# Patient Record
Sex: Female | Born: 1955 | Race: White | Hispanic: No | Marital: Married | Smoking: Never smoker
Health system: Southern US, Community
[De-identification: ages and names within clinical notes are randomized; demographics above are authoritative.]

## PROBLEM LIST (undated history)

## (undated) DIAGNOSIS — G43909 Migraine, unspecified, not intractable, without status migrainosus: Secondary | ICD-10-CM

## (undated) DIAGNOSIS — T7840XA Allergy, unspecified, initial encounter: Secondary | ICD-10-CM

## (undated) DIAGNOSIS — I871 Compression of vein: Principal | ICD-10-CM

## (undated) DIAGNOSIS — Z8489 Family history of other specified conditions: Secondary | ICD-10-CM

## (undated) DIAGNOSIS — K635 Polyp of colon: Secondary | ICD-10-CM

## (undated) DIAGNOSIS — T883XXA Malignant hyperthermia due to anesthesia, initial encounter: Secondary | ICD-10-CM

## (undated) DIAGNOSIS — I10 Essential (primary) hypertension: Secondary | ICD-10-CM

## (undated) DIAGNOSIS — T753XXA Motion sickness, initial encounter: Secondary | ICD-10-CM

## (undated) DIAGNOSIS — J3089 Other allergic rhinitis: Secondary | ICD-10-CM

## (undated) HISTORY — PX: COLON SURGERY: SHX602

## (undated) HISTORY — DX: Other allergic rhinitis: J30.89

## (undated) HISTORY — DX: Migraine, unspecified, not intractable, without status migrainosus: G43.909

## (undated) HISTORY — DX: Allergy, unspecified, initial encounter: T78.40XA

## (undated) HISTORY — DX: Essential (primary) hypertension: I10

## (undated) HISTORY — DX: Polyp of colon: K63.5

## (undated) HISTORY — DX: Motion sickness, initial encounter: T75.3XXA

## (undated) HISTORY — PX: WISDOM TOOTH EXTRACTION: SHX21

---

## 2013-10-06 DIAGNOSIS — D126 Benign neoplasm of colon, unspecified: Secondary | ICD-10-CM

## 2013-10-06 DIAGNOSIS — N321 Vesicointestinal fistula: Secondary | ICD-10-CM

## 2013-10-06 HISTORY — PX: LAPAROSCOPIC LEFT COLON RESECTION: SHX1928

## 2013-10-06 HISTORY — DX: Benign neoplasm of colon, unspecified: D12.6

## 2013-10-06 HISTORY — DX: Vesicointestinal fistula: N32.1

## 2013-10-06 HISTORY — PX: COLONOSCOPY: SHX174

## 2014-07-06 LAB — HM COLONOSCOPY

## 2015-10-07 LAB — HM PAP SMEAR: HM Pap smear: NEGATIVE

## 2015-10-07 LAB — HM MAMMOGRAPHY: HM Mammogram: NORMAL (ref 0–4)

## 2016-10-03 ENCOUNTER — Ambulatory Visit: Payer: Self-pay

## 2016-10-07 ENCOUNTER — Ambulatory Visit (INDEPENDENT_AMBULATORY_CARE_PROVIDER_SITE_OTHER): Payer: 59 | Admitting: Urgent Care

## 2016-10-07 VITALS — BP 116/68 | HR 70 | Temp 98.2°F | Resp 18 | Ht 68.0 in | Wt 170.2 lb

## 2016-10-07 DIAGNOSIS — J069 Acute upper respiratory infection, unspecified: Secondary | ICD-10-CM | POA: Diagnosis not present

## 2016-10-07 DIAGNOSIS — J029 Acute pharyngitis, unspecified: Secondary | ICD-10-CM | POA: Diagnosis not present

## 2016-10-07 DIAGNOSIS — B9789 Other viral agents as the cause of diseases classified elsewhere: Secondary | ICD-10-CM | POA: Diagnosis not present

## 2016-10-07 DIAGNOSIS — R0982 Postnasal drip: Secondary | ICD-10-CM | POA: Diagnosis not present

## 2016-10-07 MED ORDER — BENZONATATE 100 MG PO CAPS: 100.0000 mg | ORAL_CAPSULE | Freq: Three times a day (TID) | ORAL | 0 refills | Status: DC | PRN

## 2016-10-07 MED ORDER — AMOXICILLIN 500 MG PO CAPS: 500.0000 mg | ORAL_CAPSULE | Freq: Two times a day (BID) | ORAL | 0 refills | Status: DC

## 2016-10-07 MED ORDER — HYDROCODONE-HOMATROPINE 5-1.5 MG/5ML PO SYRP: 5.0000 mL | ORAL_SOLUTION | Freq: Every evening | ORAL | 0 refills | Status: DC | PRN

## 2016-10-07 MED ORDER — PSEUDOEPHEDRINE HCL ER 120 MG PO TB12: 120.0000 mg | ORAL_TABLET | Freq: Two times a day (BID) | ORAL | 3 refills | Status: DC

## 2016-10-07 NOTE — Progress Notes (Signed)
    MRN: [id number] DOB: 07/03/1956  Subjective:   Heather Hudson is a 61 y.o. female presenting for chief complaint of Nasal Congestion (x 1 week ); Cough; and Sore Throat  Reports 1 week history of worsening productive cough, sore throat, post-nasal drainage, subjective fever, fatigue. Also has sinus pain, bilateral intermittent ear pain. Has tried APAP, DayQuil with some relief. Denies red eyes, dizziness, tinnitus, ear drainage, tooth pain, chest pain, shob, wheezing, n/v, abdominal pain, rashes. Has had multiple sick contacts at work, works at a IT trainer at [employer]. Denies smoking cigarettes. Has 4 drinks of alcohol per week.   Heather Hudson has a current medication list which includes the following prescription(s): acetaminophen and pseudoephedrine-apap-dm. Also is allergic to aspirin and ibuprofen.  Heather Hudson has seasonal allergies. She used to get injections weekly for this. Denies past surgical history.   Objective:   Vitals: BP 116/68   Pulse 70   Temp 98.2 F (36.8 C) (Oral)   Resp 18   Ht 5\' 8"  (1.727 m)   Wt 170 lb 3.2 oz (77.2 kg)   SpO2 99%   BMI 25.88 kg/m   Physical Exam  Constitutional: She is oriented to person, place, and time. She appears well-developed and well-nourished.  Eyes: Right eye exhibits no discharge. Left eye exhibits no discharge.  Neck: Normal range of motion. Neck supple.  Cardiovascular: Normal rate, regular rhythm and intact distal pulses.  Exam reveals no gallop and no friction rub.   No murmur heard. Pulmonary/Chest: No respiratory distress. She has no wheezes. She has no rales.  Lymphadenopathy:    She has no cervical adenopathy.  Neurological: She is alert and oriented to person, place, and time.  Skin: Skin is warm and dry.   Assessment and Plan :   1. Viral URI with cough 2. Sore throat 3. Post-nasal drainage - Likely viral in nature, advised supportive care. I did provide patient with a script for amoxicillin if  she has no improvement by 10/11/2016 to cover for bacterial sinusitis. - If no improvement thereafter, return to clinic for a recheck.   Jaynee Eagles, PA-C [facility] [phone] 10/07/2016 5:38 PM

## 2016-10-07 NOTE — Patient Instructions (Addendum)
Upper Respiratory Infection, Adult Most upper respiratory infections (URIs) are a viral infection of the air passages leading to the lungs. A URI affects the nose, throat, and upper air passages. The most common type of URI is nasopharyngitis and is typically referred to as "the common cold." URIs run their course and usually go away on their own. Most of the time, a URI does not require medical attention, but sometimes a bacterial infection in the upper airways can follow a viral infection. This is called a secondary infection. Sinus and middle ear infections are common types of secondary upper respiratory infections. Bacterial pneumonia can also complicate a URI. A URI can worsen asthma and chronic obstructive pulmonary disease (COPD). Sometimes, these complications can require emergency medical care and may be life threatening. What are the causes? Almost all URIs are caused by viruses. A virus is a type of germ and can spread from one person to another. What increases the risk? You may be at risk for a URI if:  You smoke.  You have chronic heart or lung disease.  You have a weakened defense (immune) system.  You are very young or very old.  You have nasal allergies or asthma.  You work in crowded or poorly ventilated areas.  You work in health care facilities or schools.  What are the signs or symptoms? Symptoms typically develop 2-3 days after you come in contact with a cold virus. Most viral URIs last 7-10 days. However, viral URIs from the influenza virus (flu virus) can last 14-18 days and are typically more severe. Symptoms may include:  Runny or stuffy (congested) nose.  Sneezing.  Cough.  Sore throat.  Headache.  Fatigue.  Fever.  Loss of appetite.  Pain in your forehead, behind your eyes, and over your cheekbones (sinus pain).  Muscle aches.  How is this diagnosed? Your health care provider may diagnose a URI by:  Physical exam.  Tests to check that your  symptoms are not due to another condition such as: ? Strep throat. ? Sinusitis. ? Pneumonia. ? Asthma.  How is this treated? A URI goes away on its own with time. It cannot be cured with medicines, but medicines may be prescribed or recommended to relieve symptoms. Medicines may help:  Reduce your fever.  Reduce your cough.  Relieve nasal congestion.  Follow these instructions at home:  Take medicines only as directed by your health care provider.  Gargle warm saltwater or take cough drops to comfort your throat as directed by your health care provider.  Use a warm mist humidifier or inhale steam from a shower to increase air moisture. This may make it easier to breathe.  Drink enough fluid to keep your urine clear or pale yellow.  Eat soups and other clear broths and maintain good nutrition.  Rest as needed.  Return to work when your temperature has returned to normal or as your health care provider advises. You may need to stay home longer to avoid infecting others. You can also use a face mask and careful hand washing to prevent spread of the virus.  Increase the usage of your inhaler if you have asthma.  Do not use any tobacco products, including cigarettes, chewing tobacco, or electronic cigarettes. If you need help quitting, ask your health care provider. How is this prevented? The best way to protect yourself from getting a cold is to practice good hygiene.  Avoid oral or hand contact with people with cold symptoms.  Wash your   hands often if contact occurs.  There is no clear evidence that vitamin C, vitamin E, echinacea, or exercise reduces the chance of developing a cold. However, it is always recommended to get plenty of rest, exercise, and practice good nutrition. Contact a health care provider if:  You are getting worse rather than better.  Your symptoms are not controlled by medicine.  You have chills.  You have worsening shortness of breath.  You have  brown or red mucus.  You have yellow or brown nasal discharge.  You have pain in your face, especially when you bend forward.  You have a fever.  You have swollen neck glands.  You have pain while swallowing.  You have white areas in the back of your throat. Get help right away if:  You have severe or persistent: ? Headache. ? Ear pain. ? Sinus pain. ? Chest pain.  You have chronic lung disease and any of the following: ? Wheezing. ? Prolonged cough. ? Coughing up blood. ? A change in your usual mucus.  You have a stiff neck.  You have changes in your: ? Vision. ? Hearing. ? Thinking. ? Mood. This information is not intended to replace advice given to you by your health care provider. Make sure you discuss any questions you have with your health care provider. Document Released: 03/18/2001 Document Revised: 05/25/2016 Document Reviewed: 12/28/2013 Elsevier Interactive Patient Education  2017 Elsevier Inc.     IF you received an x-ray today, you will receive an invoice from [facility]. Please contact [facility] at [phone] with questions or concerns regarding your invoice.   IF you received labwork today, you will receive an invoice from [facility]. Please contact [facility] at [phone] with questions or concerns regarding your invoice.   Our billing staff will not be able to assist you with questions regarding bills from these companies.  You will be contacted with the lab results as soon as they are available. The fastest way to get your results is to activate your My Chart account. Instructions are located on the last page of this paperwork. If you have not heard from us regarding the results in 2 weeks, please contact this office.     

## 2018-01-04 ENCOUNTER — Ambulatory Visit: Payer: Self-pay | Admitting: Primary Care

## 2018-01-04 DIAGNOSIS — Z0289 Encounter for other administrative examinations: Secondary | ICD-10-CM

## 2018-01-26 ENCOUNTER — Ambulatory Visit: Payer: 59 | Admitting: Primary Care

## 2018-01-26 ENCOUNTER — Encounter: Payer: Self-pay | Admitting: Primary Care

## 2018-01-26 VITALS — BP 120/72 | HR 59 | Temp 98.2°F | Ht 67.0 in | Wt 169.5 lb

## 2018-01-26 DIAGNOSIS — T753XXS Motion sickness, sequela: Secondary | ICD-10-CM

## 2018-01-26 DIAGNOSIS — J3089 Other allergic rhinitis: Secondary | ICD-10-CM | POA: Diagnosis not present

## 2018-01-26 DIAGNOSIS — T753XXA Motion sickness, initial encounter: Secondary | ICD-10-CM | POA: Insufficient documentation

## 2018-01-26 MED ORDER — SCOPOLAMINE 1 MG/3DAYS TD PT72: 1.0000 | MEDICATED_PATCH | TRANSDERMAL | 0 refills | Status: DC

## 2018-01-26 NOTE — Assessment & Plan Note (Signed)
Rx for scopolamine patches sent to pharmacy. Discussed instructions for use and drowsiness precautions.

## 2018-01-26 NOTE — Assessment & Plan Note (Signed)
Chronic for years. Previously following with an allergist in [place] and would like to establish locally. Referral placed. Also recommended to start antihistamine daily and continue during the Spring season.

## 2018-01-26 NOTE — Progress Notes (Signed)
Subjective:    Patient ID: Heather Hudson, female    DOB: 03-08-1956, 62 y.o.   MRN: [id number]  HPI  Ms. Karpowicz is a 62 year old female who presents today to establish care and discuss the problems mentioned below. Will obtain old records.  1) Environmental Allergies: Diagnosed in childhood, improved for years, then problematic around 2008. Previously following with an allergist in [place], receiving injections from in April 2008 to November 2012. She was found to be allergic to spores, pollen, cat dander, tree grass. Moved from [place] to [place] in November 2017 and has had experienced an increase in allergy symptoms that are different from [place].   Since moving to [place] she's experiencing symptoms of headaches, nasal congestion, sinus pressure, post nasal drip during Spring and Fall months. She doesn't typically experience symptoms during Summer and Winter months. She does have a cat and carpet in her home.   She is not taking anything for her symptoms regularly, occasionally takes Claritin and will mostly deal with her symptoms. She's not sure if Claritin helps.   2) Motion Sickness: Present for the past 20 years, first noticed symptoms when on a sail boat at the age of 31. She's tried treatment with motion sickness bracelets and dramamine without much improvement. She will be going on a cruise that will leave May 11th and will be gone for 4 nights. She's never tried scopolamine patches or Meclizine. She has a family history of "inner ear" disorders with her mother.    Review of Systems  HENT: Positive for congestion, postnasal drip and sinus pressure.   Respiratory: Negative for cough, shortness of breath and wheezing.   Cardiovascular: Negative for chest pain.  Allergic/Immunologic: Positive for environmental allergies.  Neurological: Positive for headaches. Negative for dizziness.       No past medical history on file.   Social History   Socioeconomic History    . Marital status: Married    Spouse name: Not on file  . Number of children: Not on file  . Years of education: Not on file  . Highest education level: Not on file  Occupational History  . Not on file  Social Needs  . Financial resource strain: Not on file  . Food insecurity:    Worry: Not on file    Inability: Not on file  . Transportation needs:    Medical: Not on file    Non-medical: Not on file  Tobacco Use  . Smoking status: Never Smoker  . Smokeless tobacco: Never Used  Substance and Sexual Activity  . Alcohol use: Yes    Alcohol/week: 0.6 oz    Types: 1 Glasses of wine per week  . Drug use: No  . Sexual activity: Not on file  Lifestyle  . Physical activity:    Days per week: Not on file    Minutes per session: Not on file  . Stress: Not on file  Relationships  . Social connections:    Talks on phone: Not on file    Gets together: Not on file    Attends religious service: Not on file    Active member of club or organization: Not on file    Attends meetings of clubs or organizations: Not on file    Relationship status: Not on file  . Intimate partner violence:    Fear of current or ex partner: Not on file    Emotionally abused: Not on file    Physically abused: Not on  file    Forced sexual activity: Not on file  Other Topics Concern  . Not on file  Social History Narrative   Married.   Moved from [place].   No children.   Works in [employer].    Enjoys wine tasting.     No family history on file.  Allergies  Allergen Reactions  . Anesthesia S-I-60 Other (See Comments)  . Aspirin Hives  . Ibuprofen Hives  . Other     Anesthesia (familial) SISTER HAD MALIGNANT HYPERTHERMIA    No current outpatient medications on file prior to visit.   No current facility-administered medications on file prior to visit.     BP 120/72   Pulse (!) 59   Temp 98.2 F (36.8 C) (Oral)   Ht 5\' 7"  (1.702 m)   Wt 169 lb 8 oz (76.9 kg)   SpO2 98%    BMI 26.55 kg/m    Objective:   Physical Exam  Constitutional: She appears well-nourished.  Neck: Neck supple.  Cardiovascular: Normal rate and regular rhythm.  Pulmonary/Chest: Effort normal and breath sounds normal.  Skin: Skin is warm and dry.  Psychiatric: She has a normal mood and affect.          Assessment & Plan:

## 2018-01-26 NOTE — Patient Instructions (Addendum)
Apply the scopolamine patch behind the ear, on a hairless area, 4 hours prior to your cruise. Change every 3 days.   You will be contacted regarding your referral to the allergist.  Please let us know if you have not been contacted within one week.   Start Claritin or Zyrtec daily for at least 4-6 weeks during allergy season.  Please schedule a physical with me anytime this year.   It was a pleasure to meet you today! Please don't hesitate to call or message me with any questions. Welcome to [facility]!

## 2018-01-27 ENCOUNTER — Telehealth: Payer: Self-pay | Admitting: Primary Care

## 2018-01-27 ENCOUNTER — Encounter: Payer: Self-pay | Admitting: Primary Care

## 2018-01-27 NOTE — Telephone Encounter (Signed)
Copied from [place] [id number]. Topic: Quick Communication - See Telephone Encounter >> Jan 27, 2018  5:13 PM Rutherford Nail, NT wrote: CRM for notification. See Telephone encounter for: 01/27/18. Marlowe Kays from [facility] is calling to see if a fax was received  for patient records. States a request of information was sent from the office to them and requesting information from 2010-present. States that the patient was seen in 2013. The fax is 48 pages. States she was wondering if there was certain information that Allie Bossier was looking for that she could prioritize the information if needed. States the office will be open until 6pm today(01/27/18) and tomorrow(01/28/18) from 1pm-7pm. Please advise.  CB#: [phone].

## 2018-01-28 NOTE — Telephone Encounter (Signed)
Can you notify them that we don't need any further records unless it's from her PCP?

## 2018-01-28 NOTE — Telephone Encounter (Signed)
No need for further records from [facility].  Anastaisya, did we get those records from [facility] and Allergy faxed to the local allergist.

## 2018-01-28 NOTE — Telephone Encounter (Signed)
Yes I did fax over her records from [place] office that patient brought with her to her visit to local allergist and appt set up for her too.Kris Mouton, RMA

## 2018-01-28 NOTE — Telephone Encounter (Signed)
Spoke with Heather Hudson at [facility] and advised her we do not need Allergy records and that patient brought them to [employer], RMA

## 2018-07-14 ENCOUNTER — Ambulatory Visit: Payer: 59 | Admitting: Primary Care

## 2018-07-21 ENCOUNTER — Other Ambulatory Visit: Payer: Self-pay | Admitting: Primary Care

## 2018-07-21 DIAGNOSIS — Z1231 Encounter for screening mammogram for malignant neoplasm of breast: Secondary | ICD-10-CM

## 2018-08-27 ENCOUNTER — Ambulatory Visit
Admission: RE | Admit: 2018-08-27 | Discharge: 2018-08-27 | Disposition: A | Discharge status: Home / Self Care | Payer: 59 | Source: Ambulatory Visit | Attending: Primary Care | Admitting: Primary Care

## 2018-08-27 DIAGNOSIS — Z1231 Encounter for screening mammogram for malignant neoplasm of breast: Secondary | ICD-10-CM

## 2018-08-27 IMAGING — MG DIGITAL SCREENING BILATERAL MAMMOGRAM WITH CAD
1 series · 1 of 1 positions shown · non-contrast
Comparison: None.

CLINICAL DATA: Screening.

EXAM:
DIGITAL SCREENING BILATERAL MAMMOGRAM WITH CAD

[R MLO]
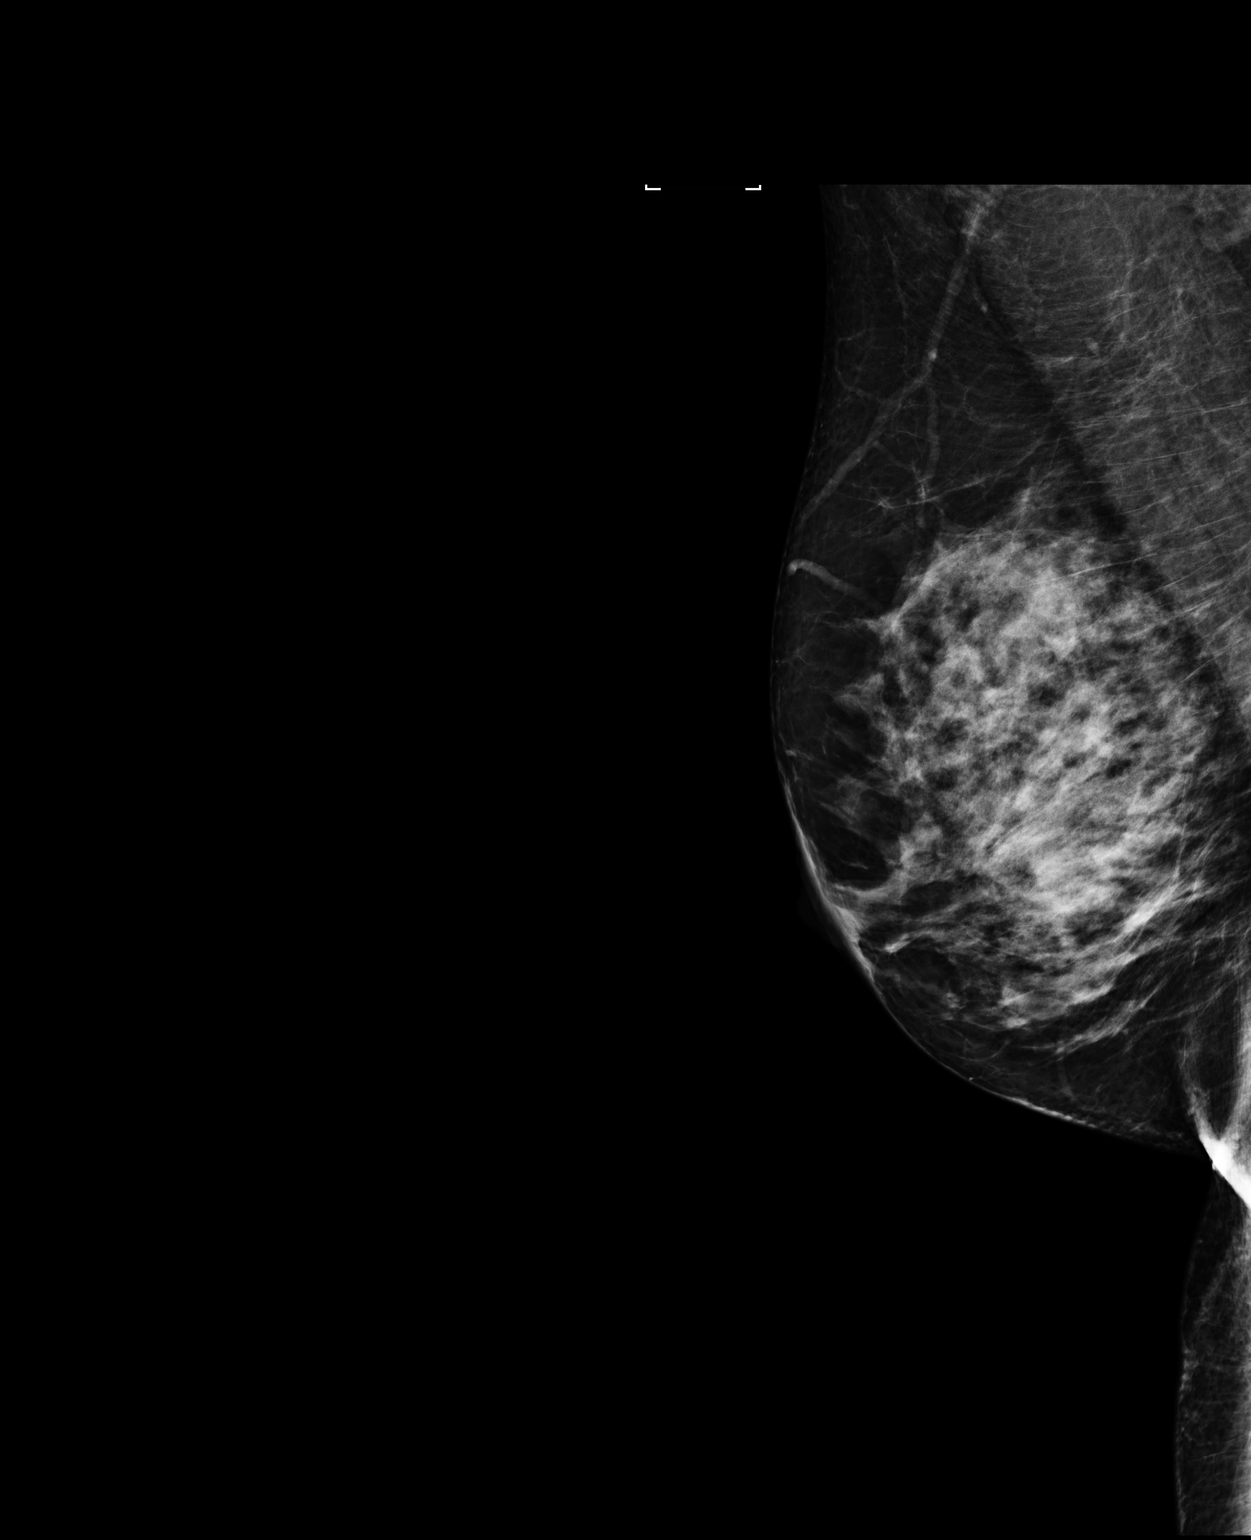

[1 of 1 positions shown; findings below may reference images not displayed]

ACR Breast Density Category c: The breast tissue is heterogeneously
dense, which may obscure small masses
FINDINGS: There are no findings suspicious for malignancy. Images were
processed with CAD.
IMPRESSION: No mammographic evidence of malignancy. A result letter of this
screening mammogram will be mailed directly to the patient.

RECOMMENDATION:
Screening mammogram in one year. (Code:[RG])

BI-RADS CATEGORY  1: Negative.

## 2018-09-05 ENCOUNTER — Other Ambulatory Visit: Payer: Self-pay | Admitting: Primary Care

## 2018-09-05 DIAGNOSIS — Z1159 Encounter for screening for other viral diseases: Secondary | ICD-10-CM

## 2018-09-05 DIAGNOSIS — Z0000 Encounter for general adult medical examination without abnormal findings: Secondary | ICD-10-CM

## 2018-09-06 ENCOUNTER — Other Ambulatory Visit (INDEPENDENT_AMBULATORY_CARE_PROVIDER_SITE_OTHER): Payer: 59

## 2018-09-06 DIAGNOSIS — Z1159 Encounter for screening for other viral diseases: Secondary | ICD-10-CM

## 2018-09-06 DIAGNOSIS — Z0000 Encounter for general adult medical examination without abnormal findings: Secondary | ICD-10-CM | POA: Diagnosis not present

## 2018-09-07 ENCOUNTER — Encounter: Payer: Self-pay | Admitting: Primary Care

## 2018-09-07 ENCOUNTER — Other Ambulatory Visit (HOSPITAL_COMMUNITY)
Admission: RE | Admit: 2018-09-07 | Discharge: 2018-09-07 | Disposition: A | Discharge status: Home / Self Care | Payer: 59 | Source: Ambulatory Visit | Attending: Primary Care | Admitting: Primary Care

## 2018-09-07 ENCOUNTER — Ambulatory Visit (INDEPENDENT_AMBULATORY_CARE_PROVIDER_SITE_OTHER): Payer: 59 | Admitting: Primary Care

## 2018-09-07 ENCOUNTER — Ambulatory Visit: Payer: 59 | Admitting: Primary Care

## 2018-09-07 VITALS — BP 122/78 | HR 61 | Temp 98.5°F | Ht 67.0 in | Wt 174.5 lb

## 2018-09-07 DIAGNOSIS — Z124 Encounter for screening for malignant neoplasm of cervix: Secondary | ICD-10-CM | POA: Insufficient documentation

## 2018-09-07 DIAGNOSIS — Z23 Encounter for immunization: Secondary | ICD-10-CM | POA: Diagnosis not present

## 2018-09-07 DIAGNOSIS — E785 Hyperlipidemia, unspecified: Secondary | ICD-10-CM | POA: Diagnosis not present

## 2018-09-07 DIAGNOSIS — Z0000 Encounter for general adult medical examination without abnormal findings: Secondary | ICD-10-CM | POA: Diagnosis not present

## 2018-09-07 LAB — LIPID PANEL
Cholesterol: 225 mg/dL — ABNORMAL HIGH (ref 0–200)
HDL: 55.8 mg/dL (ref >39.00)
LDL Cholesterol: 147 mg/dL — ABNORMAL HIGH (ref 0–99)
NonHDL: 169.2
Total CHOL/HDL Ratio: 4
Triglycerides: 109 mg/dL (ref 0.0–149.0)
VLDL: 21.8 mg/dL (ref 0.0–40.0)

## 2018-09-07 LAB — COMPREHENSIVE METABOLIC PANEL
ALT: 24 U/L (ref 0–35)
AST: 21 U/L (ref 0–37)
Albumin: 4.7 g/dL (ref 3.5–5.2)
Alkaline Phosphatase: 74 U/L (ref 39–117)
BUN: 12 mg/dL (ref 6–23)
CO2: 26 mEq/L (ref 19–32)
Calcium: 10.1 mg/dL (ref 8.4–10.5)
Chloride: 105 mEq/L (ref 96–112)
Creatinine, Ser: 0.67 mg/dL (ref 0.40–1.20)
GFR: 95.03 mL/min (ref >60.00)
Glucose, Bld: 87 mg/dL (ref 70–99)
Potassium: 3.9 mEq/L (ref 3.5–5.1)
SODIUM: 139 meq/L (ref 135–145)
TOTAL PROTEIN: 7.3 g/dL (ref 6.0–8.3)
Total Bilirubin: 0.5 mg/dL (ref 0.2–1.2)

## 2018-09-07 LAB — HEPATITIS C ANTIBODY
Hepatitis C Ab: NONREACTIVE
SIGNAL TO CUT-OFF: 0.01 (ref <1.00)

## 2018-09-07 MED ORDER — ZOSTER VAC RECOMB ADJUVANTED 50 MCG/0.5ML IM SUSR: 0.5000 mL | Freq: Once | INTRAMUSCULAR | 1 refills | Status: AC

## 2018-09-07 NOTE — Progress Notes (Signed)
Subjective:    Patient ID: Heather Hudson, female    DOB: [birth year], 62 y.o.   MRN: [id number]  HPI  Ms. Heather Hudson is a 62 year old female who presents today for complete physical.  Immunizations: -Tetanus: Completed in 2015 -Influenza: Will get at [employer] -Shingles: Never completed  Diet: She endorses a fair diet. Breakfast: Hard boiled eggs, oatmeal Lunch: Chicken, pork chop, pasta, salad,  Dinner: Lighter dinner, sometimes snacks Snacks: Popcorn, candy Desserts: Once weekly  Beverages: Coffee, little water, wine    Exercise: She is not exercising  Eye exam: Completed in December 2018 Dental exam: Completes semi-annually  Colonoscopy: Completed in 2015 Pap Smear: Completed in either 2016 or 2017.  Records were not received. Mammogram: Completed in 2019 Hep C Screen: Negative   Review of Systems  Constitutional: Negative for unexpected weight change.  HENT: Negative for rhinorrhea.   Respiratory: Negative for cough and shortness of breath.   Cardiovascular: Negative for chest pain.  Gastrointestinal: Negative for constipation and diarrhea.  Genitourinary: Negative for difficulty urinating.  Musculoskeletal: Negative for arthralgias.  Skin: Negative for rash.  Allergic/Immunologic: Positive for environmental allergies.  Neurological: Negative for dizziness and numbness.       Occasional headaches with weather changes  Psychiatric/Behavioral: The patient is not nervous/anxious.        Past Medical History:  Diagnosis Date  . Environmental and seasonal allergies   . Migraines   . Motion sickness      Social History   Socioeconomic History  . Marital status: Married    Spouse name: Not on file  . Number of children: Not on file  . Years of education: Not on file  . Highest education level: Not on file  Occupational History  . Not on file  Social Needs  . Financial resource strain: Not on file  . Food insecurity:    Worry: Not on file    Inability: Not on  file  . Transportation needs:    Medical: Not on file    Non-medical: Not on file  Tobacco Use  . Smoking status: Never Smoker  . Smokeless tobacco: Never Used  Substance and Sexual Activity  . Alcohol use: Yes    Alcohol/week: 1.0 standard drinks    Types: 1 Glasses of wine per week  . Drug use: No  . Sexual activity: Not on file  Lifestyle  . Physical activity:    Days per week: Not on file    Minutes per session: Not on file  . Stress: Not on file  Relationships  . Social connections:    Talks on phone: Not on file    Gets together: Not on file    Attends religious service: Not on file    Active member of club or organization: Not on file    Attends meetings of clubs or organizations: Not on file    Relationship status: Not on file  . Intimate partner violence:    Fear of current or ex partner: Not on file    Emotionally abused: Not on file    Physically abused: Not on file    Forced sexual activity: Not on file  Other Topics Concern  . Not on file  Social History Narrative   Married.   Moved from [place].   No children.   Works in [employer].    Enjoys wine tasting.      No family history on file.  Allergies  Allergen Reactions  . Anesthesia  S-I-60 Other (See Comments)  . Aspirin Hives  . Ibuprofen Hives  . Other     Anesthesia (familial) SISTER HAD MALIGNANT HYPERTHERMIA    No current outpatient medications on file prior to visit.   No current facility-administered medications on file prior to visit.     BP 122/78   Pulse 61   Temp 98.5 F (36.9 C) (Oral)   Ht 5\' 7"  (1.702 m)   Wt 174 lb 8 oz (79.2 kg)   SpO2 98%   BMI 27.33 kg/m    Objective:   Physical Exam  Constitutional: She is oriented to person, place, and time. She appears well-nourished.  HENT:  Mouth/Throat: No oropharyngeal exudate.  Eyes: Pupils are equal, round, and reactive to light. EOM are normal.  Neck: Neck supple. No thyromegaly present.    Cardiovascular: Normal rate and regular rhythm.  Respiratory: Effort normal and breath sounds normal.  GI: Soft. Bowel sounds are normal. There is no tenderness.  Genitourinary: There is no tenderness or lesion on the right labia. There is no tenderness or lesion on the left labia. Cervix exhibits no discharge. Right adnexum displays no tenderness. Left adnexum displays no tenderness. No erythema in the vagina. No vaginal discharge found.  Musculoskeletal: Normal range of motion.  Neurological: She is alert and oriented to person, place, and time.  Skin: Skin is warm and dry.  Psychiatric: She has a normal mood and affect.           Assessment & Plan:

## 2018-09-07 NOTE — Patient Instructions (Signed)
Start exercising. You should be getting 150 minutes of moderate intensity exercise weekly.  It's important to improve your diet by reducing consumption of fast food, fried food, processed snack foods, sugary drinks. Increase consumption of fresh vegetables and fruits, whole grains, water.  Ensure you are drinking 64 ounces of water daily.  Take the shingles vaccination to your pharmacy.  It was a pleasure to see you today!

## 2018-09-07 NOTE — Assessment & Plan Note (Addendum)
Tetanus vaccination up-to-date.  Prescription provided for shingles vaccination.  [facility]. Mammogram up-to-date. Pap smear completed, pending. Recommended regular exercise, continue to work on her diet. Exam unremarkable. Labs reviewed. Follow-up in 1 year for CPE.

## 2018-09-07 NOTE — Assessment & Plan Note (Signed)
LDL slightly above goal at 147.  HDL 55.  Recommended she start exercising regularly, work on diet.  Repeat labs in 1 year.

## 2018-09-09 LAB — CYTOLOGY - PAP
DIAGNOSIS: NEGATIVE
HPV (WINDOPATH): NOT DETECTED

## 2018-11-19 ENCOUNTER — Ambulatory Visit: Payer: 59 | Admitting: Primary Care

## 2018-11-22 ENCOUNTER — Ambulatory Visit: Payer: 59 | Admitting: Primary Care

## 2018-11-22 ENCOUNTER — Encounter: Payer: Self-pay | Admitting: Primary Care

## 2018-11-22 VITALS — BP 124/80 | HR 57 | Temp 98.2°F | Ht 67.0 in | Wt 175.5 lb

## 2018-11-22 DIAGNOSIS — J019 Acute sinusitis, unspecified: Secondary | ICD-10-CM | POA: Diagnosis not present

## 2018-11-22 MED ORDER — AMOXICILLIN-POT CLAVULANATE 875-125 MG PO TABS: 1.0000 | ORAL_TABLET | Freq: Two times a day (BID) | ORAL | 0 refills | Status: DC

## 2018-11-22 MED ORDER — BENZONATATE 200 MG PO CAPS: 200.0000 mg | ORAL_CAPSULE | Freq: Three times a day (TID) | ORAL | 0 refills | Status: DC | PRN

## 2018-11-22 NOTE — Patient Instructions (Signed)
Start Augmentin antibiotics for the infection Take 1 tablet by mouth twice daily for 10 days.  You may take Benzonatate capsules for cough. Take 1 capsule by mouth three times daily as needed for cough.  Make sure to drink plenty of fluids and rest.  It was a pleasure to see you today!

## 2018-11-22 NOTE — Progress Notes (Signed)
Subjective:    Patient ID: Heather Hudson, female    DOB: 09-29-1956, 63 y.o.   MRN: [id number]  HPI  Heather Hudson is a 63 year old female with a history of environmental allergies who presents today with a chief complaint of sinus pressure.  She also reports nasal congestion, headache, dry throat. Her symptoms began two weeks ago with sore throat, then voice hoariness. Her cough is productive with green sputum. She's taken some Tylenol with some improvement in headaches. Over the last several days her symptoms have progressed.   Review of Systems  Constitutional: Positive for fatigue.  HENT: Positive for congestion, sinus pressure and sinus pain. Negative for ear pain and sore throat.   Respiratory: Positive for cough. Negative for shortness of breath.   Cardiovascular: Negative for chest pain.       Past Medical History:  Diagnosis Date  . Environmental and seasonal allergies   . Migraines   . Motion sickness      Social History   Socioeconomic History  . Marital status: Married    Spouse name: Not on file  . Number of children: Not on file  . Years of education: Not on file  . Highest education level: Not on file  Occupational History  . Not on file  Social Needs  . Financial resource strain: Not on file  . Food insecurity:    Worry: Not on file    Inability: Not on file  . Transportation needs:    Medical: Not on file    Non-medical: Not on file  Tobacco Use  . Smoking status: Never Smoker  . Smokeless tobacco: Never Used  Substance and Sexual Activity  . Alcohol use: Yes    Alcohol/week: 1.0 standard drinks    Types: 1 Glasses of wine per week  . Drug use: No  . Sexual activity: Not on file  Lifestyle  . Physical activity:    Days per week: Not on file    Minutes per session: Not on file  . Stress: Not on file  Relationships  . Social connections:    Talks on phone: Not on file    Gets together: Not on file    Attends religious service: Not on file   Active member of club or organization: Not on file    Attends meetings of clubs or organizations: Not on file    Relationship status: Not on file  . Intimate partner violence:    Fear of current or ex partner: Not on file    Emotionally abused: Not on file    Physically abused: Not on file    Forced sexual activity: Not on file  Other Topics Concern  . Not on file  Social History Narrative   Married.   Moved from [place].   No children.   Works in [employer].    Enjoys wine tasting.     No past surgical history on file.  No family history on file.  Allergies  Allergen Reactions  . Anesthesia S-[id number] Other (See Comments)  . Aspirin Hives  . Ibuprofen Hives  . Other     Anesthesia (familial) SISTER HAD MALIGNANT HYPERTHERMIA    No current outpatient medications on file prior to visit.   No current facility-administered medications on file prior to visit.     BP 124/80   Pulse (!) 57   Temp 98.2 F (36.8 C) (Oral)   Ht 5\' 7"  (1.702 m)   Wt 175 lb  8 oz (79.6 kg)   SpO2 98%   BMI 27.49 kg/m    Objective:   Physical Exam  Constitutional: She appears well-nourished. She does not appear ill.  HENT:  Right Ear: Tympanic membrane and ear canal normal.  Left Ear: Tympanic membrane and ear canal normal.  Nose: Mucosal edema present. Right sinus exhibits maxillary sinus tenderness. Right sinus exhibits no frontal sinus tenderness. Left sinus exhibits maxillary sinus tenderness. Left sinus exhibits no frontal sinus tenderness.  Mouth/Throat: Oropharynx is clear and moist.  Neck: Neck supple.  Cardiovascular: Normal rate and regular rhythm.  Respiratory: Effort normal and breath sounds normal. She has no wheezes.  Skin: Skin is warm and dry.           Assessment & Plan:  Acute Sinusitis:  Symptoms for nearly two weeks, now progressing. Exam today overall stable, clear lungs, sinus tenderness noted.  Do suspect bacterial involvement at this point  given exam and duration of symptoms. Rx for Augmentin course and Benzonatate capsules sent to pharmacy. Fluids, rest, follow up PRN.  [name], NP

## 2019-11-09 ENCOUNTER — Other Ambulatory Visit: Payer: Self-pay | Admitting: Primary Care

## 2019-11-09 DIAGNOSIS — Z1231 Encounter for screening mammogram for malignant neoplasm of breast: Secondary | ICD-10-CM

## 2019-11-10 ENCOUNTER — Ambulatory Visit
Admission: RE | Admit: 2019-11-10 | Discharge: 2019-11-10 | Disposition: A | Discharge status: Home / Self Care | Payer: 59 | Source: Ambulatory Visit

## 2019-11-10 ENCOUNTER — Other Ambulatory Visit: Payer: Self-pay

## 2019-11-10 DIAGNOSIS — Z1231 Encounter for screening mammogram for malignant neoplasm of breast: Secondary | ICD-10-CM

## 2019-11-10 IMAGING — MG DIGITAL SCREENING BILAT W/ TOMO W/ CAD
8 series · 8 of 24 positions shown · non-contrast
Comparison: Previous exam(s).

CLINICAL DATA: Screening.

EXAM:
DIGITAL SCREENING BILATERAL MAMMOGRAM WITH TOMO AND CAD

[R CC synth-2D]
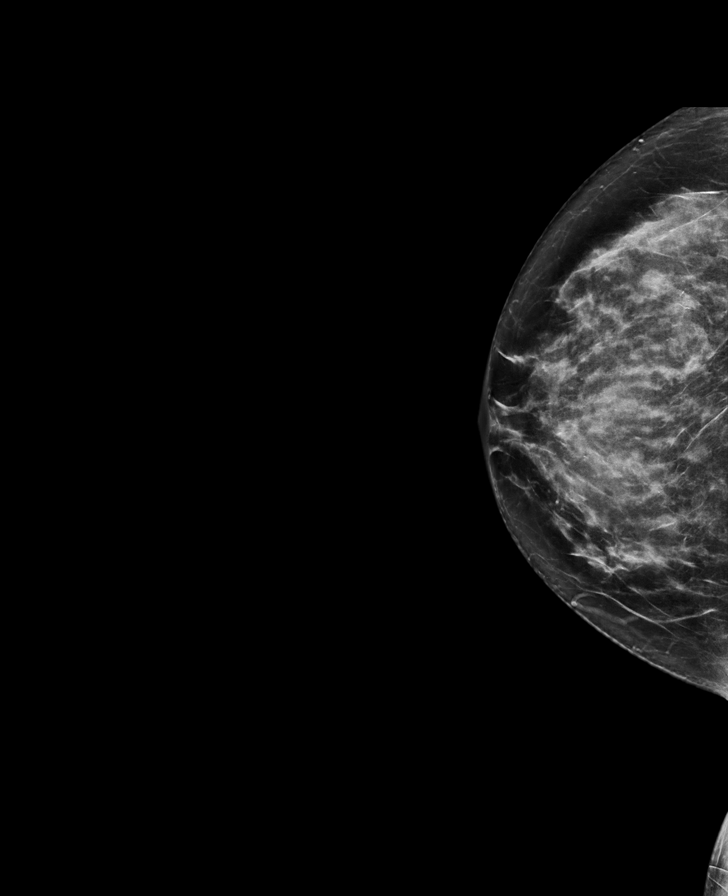

[L CC synth-2D]
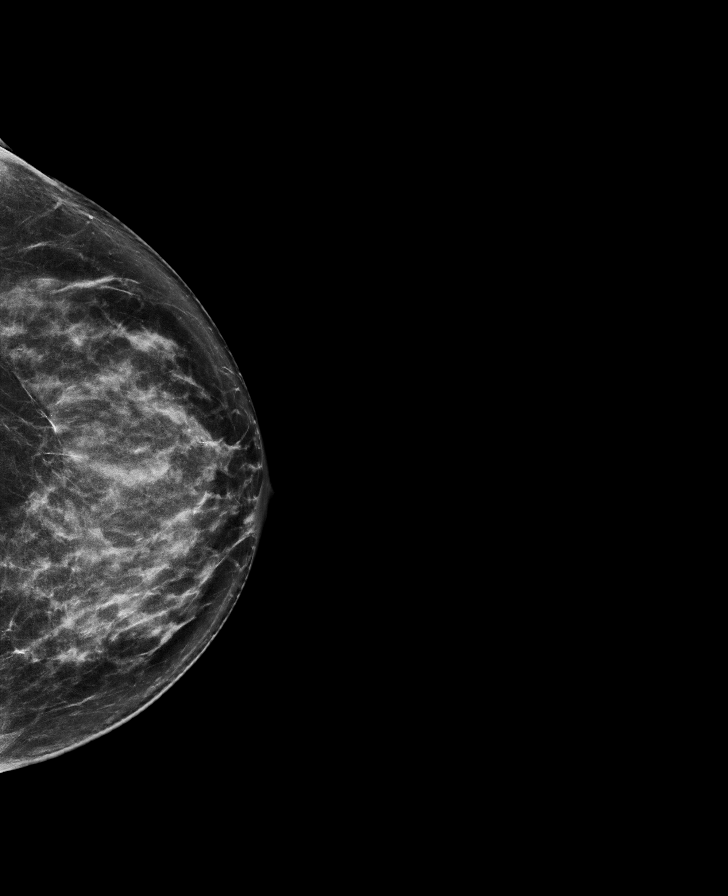

[R MLO synth-2D]
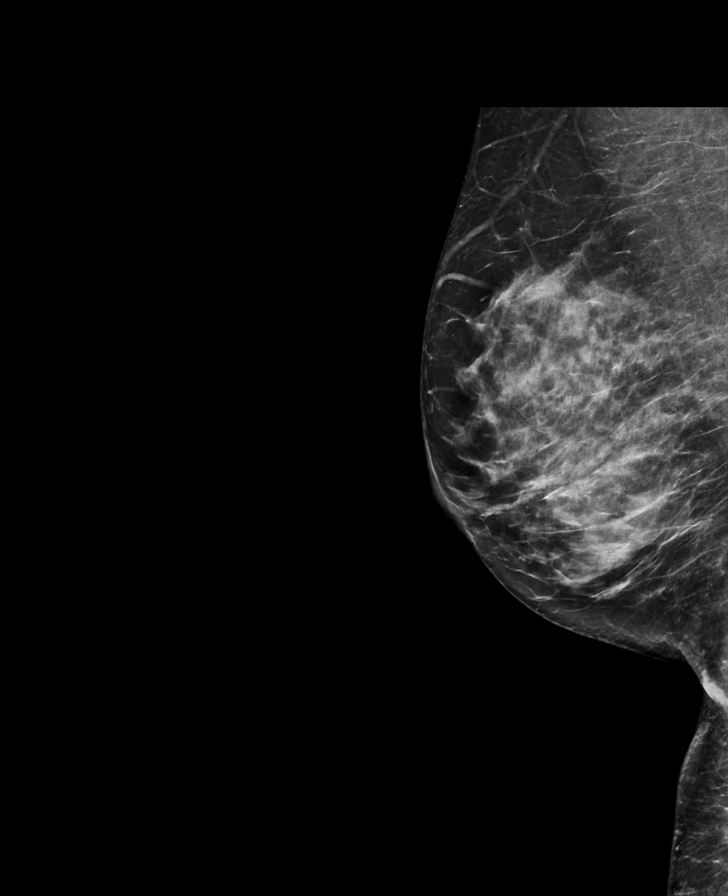

[L MLO synth-2D]
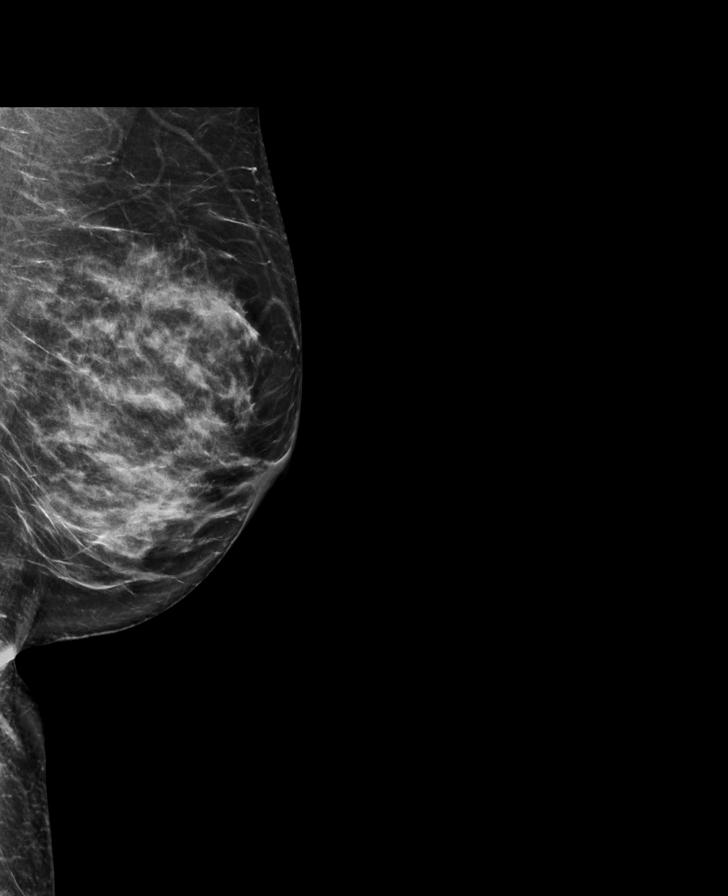

[R CC tomo · tomo slice 35/70.0]
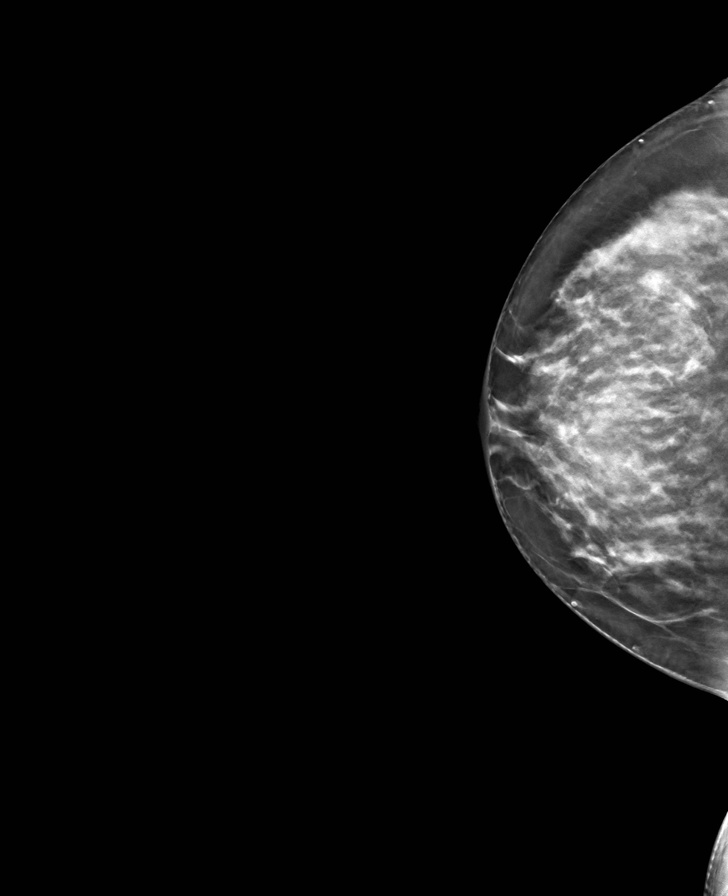

[L CC tomo · tomo slice 37/73.0]
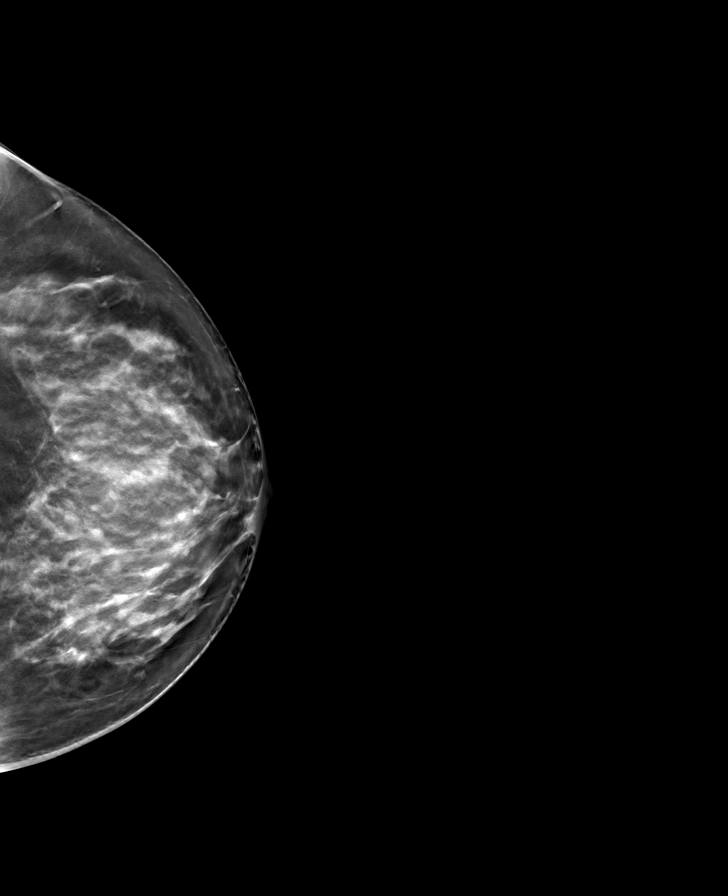

[R MLO tomo · tomo slice 37/74.0]
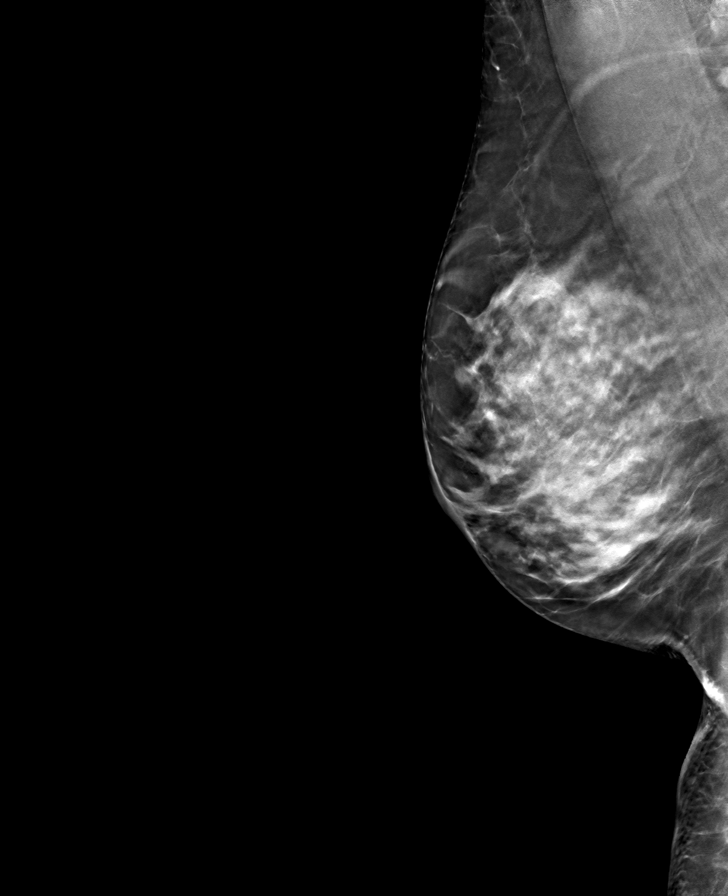

[L MLO tomo · tomo slice 37/73.0]
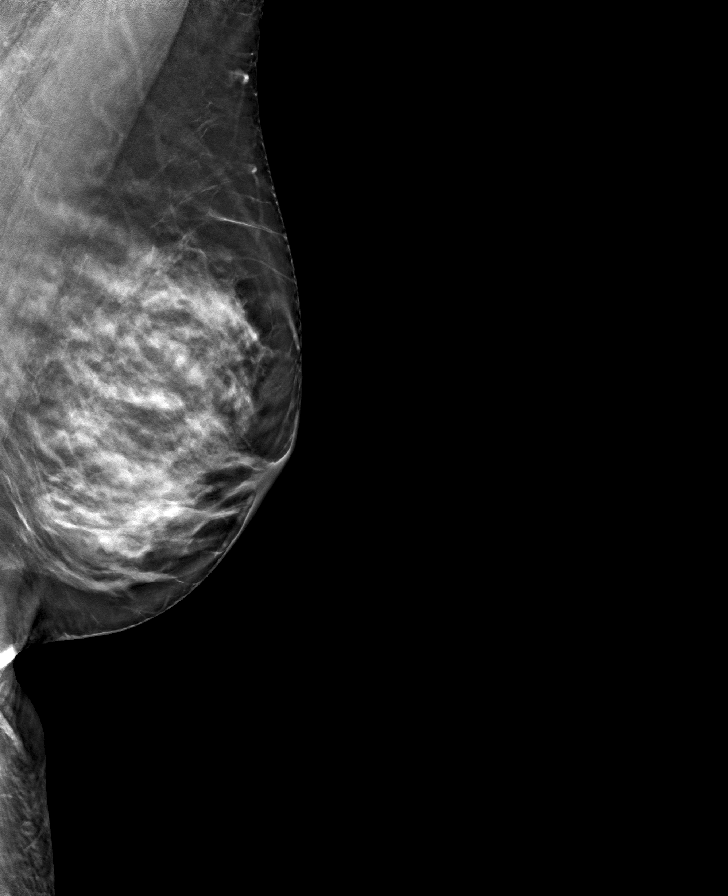

[8 of 24 positions shown; findings below may reference images not displayed]

ACR Breast Density Category c: The breast tissue is heterogeneously
dense, which may obscure small masses.
FINDINGS: There are no findings suspicious for malignancy. Images were
processed with CAD.
IMPRESSION: No mammographic evidence of malignancy. A result letter of this
screening mammogram will be mailed directly to the patient.

RECOMMENDATION:
Screening mammogram in one year. (Code:[5V])

BI-RADS CATEGORY  1: Negative.

## 2019-12-23 ENCOUNTER — Ambulatory Visit: Payer: 59 | Admitting: Primary Care

## 2019-12-23 ENCOUNTER — Other Ambulatory Visit: Payer: Self-pay

## 2019-12-23 VITALS — BP 126/86 | HR 62 | Temp 96.5°F | Ht 67.0 in | Wt 188.0 lb

## 2019-12-23 DIAGNOSIS — R1030 Lower abdominal pain, unspecified: Secondary | ICD-10-CM | POA: Diagnosis not present

## 2019-12-23 HISTORY — DX: Lower abdominal pain, unspecified: R10.30

## 2019-12-23 NOTE — Progress Notes (Signed)
Subjective:    Patient ID: Heather Hudson, female    DOB: [birth year], 64 y.o.   MRN: [id number]  HPI  This visit occurred during the SARS-CoV-2 public health emergency.  Safety protocols were in place, including screening questions prior to the visit, additional usage of staff PPE, and extensive cleaning of exam room while observing appropriate contact time as indicated for disinfecting solutions.   Heather Hudson is a 64 year old female with a history of hyperlipidemia, motion sickness, intestinal fistula who presents today with a chief complaint of abdominal pain.  The pain is located to the left lower abdomen for which she's noticed intermittently over the last year, more consistently over the last month. She denies pain unless she "twists a certain way" which will feel like a pinching/achy feeling. She has been taking yoga classes over the last month.   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 She denies nausea, vomiting, diarrhea, bloody stools, changes in bowel movements, dietary changes, urinary or vaginal symptoms, history of diverticulosis/diverticulitis.   BP Readings from Last 3 Encounters:  12/23/19 126/86  11/22/18 124/80  09/07/18 122/78     Review of Systems  Constitutional: Negative for fever.  Gastrointestinal: Positive for abdominal pain. Negative for blood in stool and diarrhea.  Genitourinary: Negative for frequency and vaginal discharge.       Past Medical History:  Diagnosis Date  . Environmental and seasonal allergies   . Migraines   . Motion sickness      Social History   Socioeconomic History  . Marital status: Married    Spouse name: Not on file  . Number of children: Not on file  . Years of education: Not on file  . Highest education level: Not on file  Occupational History  . Not on file  Tobacco Use  . Smoking status: Never Smoker  . Smokeless tobacco: Never Used  Substance and Sexual Activity    . Alcohol use: Yes    Alcohol/week: 1.0 standard drinks    Types: 1 Glasses of wine per week  . Drug use: No  . Sexual activity: Not on file  Other Topics Concern  . Not on file  Social History Narrative   Married.   Moved from [place].   No children.   Works in [employer].    Enjoys wine tasting.    Social Determinants of Health   Financial Resource Strain:   . Difficulty of Paying Living Expenses:   Food Insecurity:   . Worried About Charity fundraiser in the Last Year:   . Arboriculturist in the Last Year:   Transportation Needs:   . Film/video editor (Medical):   Marland Kitchen Lack of Transportation (Non-Medical):   Physical Activity:   . Days of Exercise per Week:   . Minutes of Exercise per Session:   Stress:   . Feeling of Stress :   Social Connections:   . Frequency of Communication with Friends and Family:   . Frequency of Social Gatherings with Friends and Family:   . Attends Religious Services:   . Active Member of Clubs or Organizations:   . Attends Archivist Meetings:   Marland Kitchen Marital Status:   Intimate Partner Violence:   . Fear of Current or Ex-Partner:   . Emotionally Abused:   Marland Kitchen Physically Abused:   . Sexually Abused:     No past surgical history on file.  No family history on file.  Allergies  Allergen Reactions  . Aspirin Hives  . Ibuprofen Hives  . Other     Anesthesia (familial) SISTER HAD MALIGNANT HYPERTHERMIA  . Propofol Other (See Comments)    No current outpatient medications on file prior to visit.   No current facility-administered medications on file prior to visit.    BP 126/86   Pulse 62   Temp (!) 96.5 F (35.8 C) (Temporal)   Ht 5\' 7"  (1.702 m)   Wt 188 lb (85.3 kg)   SpO2 98%   BMI 29.44 kg/m    Objective:   Physical Exam  Constitutional: She appears well-nourished.  Respiratory: Effort normal.  GI: Soft. Bowel sounds are normal. There is no abdominal tenderness.  Skin: Skin is warm and  dry.           Assessment & Plan:

## 2019-12-23 NOTE — Patient Instructions (Signed)
Stop by the lab prior to leaving today. I will notify you of your results once received.   You will be contacted regarding your ultrasound.  Please let us know if you have not been contacted within two weeks.   It was a pleasure to see you today!   

## 2019-12-23 NOTE — Assessment & Plan Note (Signed)
Intermittent x 1 year, more noticeable/frequent x 1 month. Could be more noticeable since stretching from Yoga.  Exam today benign. Check labs. [place] of abdomen pending. Rule out hernia, scar tissue from surgery.

## 2019-12-23 NOTE — Addendum Note (Signed)
Addended by: Ellamae Sia on: 12/23/2019 09:03 AM   Modules accepted: Orders

## 2019-12-26 ENCOUNTER — Other Ambulatory Visit (INDEPENDENT_AMBULATORY_CARE_PROVIDER_SITE_OTHER): Payer: 59

## 2019-12-26 ENCOUNTER — Other Ambulatory Visit: Payer: Self-pay

## 2019-12-26 DIAGNOSIS — R1030 Lower abdominal pain, unspecified: Secondary | ICD-10-CM

## 2019-12-26 LAB — CBC
HCT: 41.2 % (ref 36.0–46.0)
Hemoglobin: 14.3 g/dL (ref 12.0–15.0)
MCHC: 34.6 g/dL (ref 30.0–36.0)
MCV: 90.6 fl (ref 78.0–100.0)
Platelets: 162 10*3/uL (ref 150.0–400.0)
RBC: 4.55 Mil/uL (ref 3.87–5.11)
RDW: 12.7 % (ref 11.5–15.5)
WBC: 4.2 10*3/uL (ref 4.0–10.5)

## 2019-12-26 LAB — COMPREHENSIVE METABOLIC PANEL
ALT: 24 U/L (ref 0–35)
AST: 22 U/L (ref 0–37)
Albumin: 4 g/dL (ref 3.5–5.2)
Alkaline Phosphatase: 83 U/L (ref 39–117)
BUN: 14 mg/dL (ref 6–23)
CO2: 25 mEq/L (ref 19–32)
Calcium: 9.5 mg/dL (ref 8.4–10.5)
Chloride: 108 mEq/L (ref 96–112)
Creatinine, Ser: 0.67 mg/dL (ref 0.40–1.20)
GFR: 89.03 mL/min (ref >60.00)
Glucose, Bld: 106 mg/dL — ABNORMAL HIGH (ref 70–99)
Potassium: 3.7 mEq/L (ref 3.5–5.1)
Sodium: 138 mEq/L (ref 135–145)
Total Bilirubin: 0.6 mg/dL (ref 0.2–1.2)
Total Protein: 6.8 g/dL (ref 6.0–8.3)

## 2019-12-28 ENCOUNTER — Other Ambulatory Visit: Payer: 59

## 2019-12-29 ENCOUNTER — Ambulatory Visit
Admission: RE | Admit: 2019-12-29 | Discharge: 2019-12-29 | Disposition: A | Discharge status: Home / Self Care | Payer: 59 | Source: Ambulatory Visit | Attending: Primary Care | Admitting: Primary Care

## 2019-12-29 DIAGNOSIS — R1030 Lower abdominal pain, unspecified: Secondary | ICD-10-CM

## 2019-12-29 IMAGING — US US ABDOMEN LIMITED
1 series · 14 of 19 positions shown · non-contrast
Comparison: None.

CLINICAL DATA: Left pelvic pain

EXAM:
ULTRASOUND ABDOMEN LIMITED

[Series 1: us abdomen limited · 0.11mm/px · 19 acquisitions, 14 frames shown]
[im 1/19]
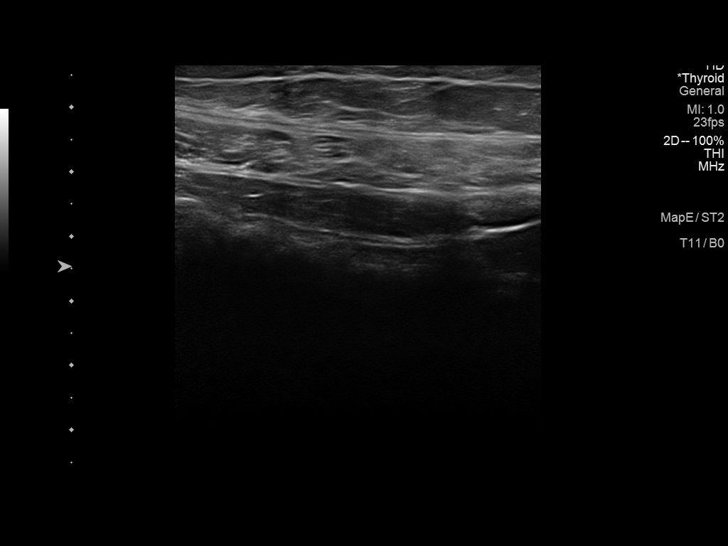
[im 3/19]
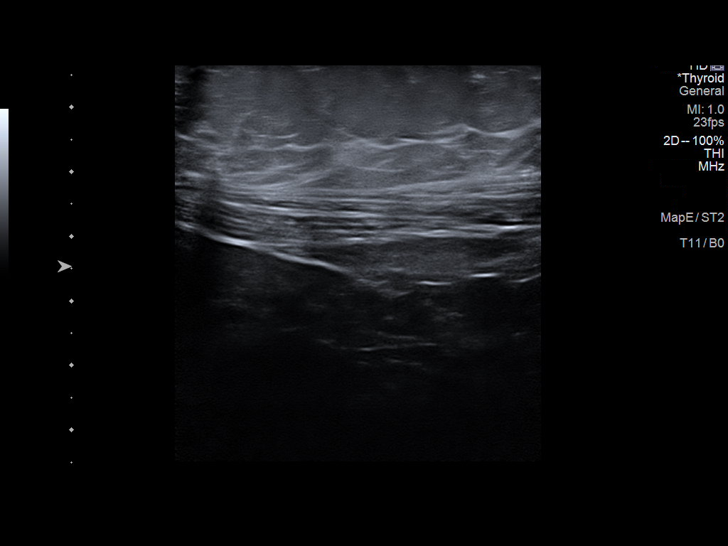
[im 4/19]
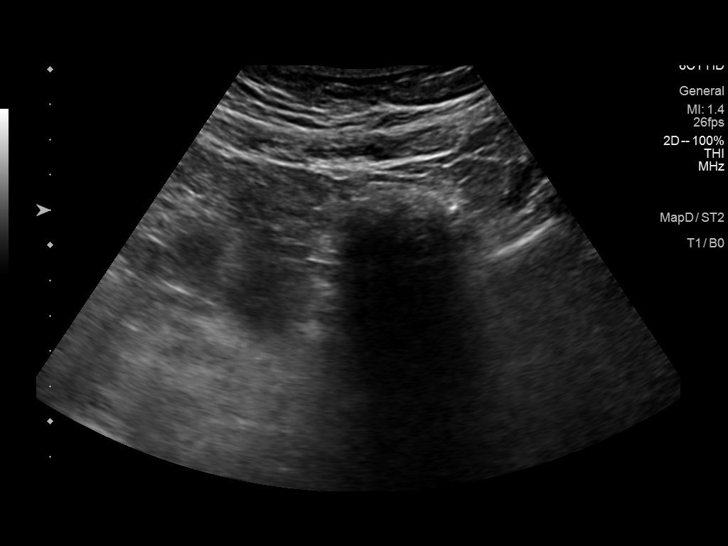
[im 5/19]
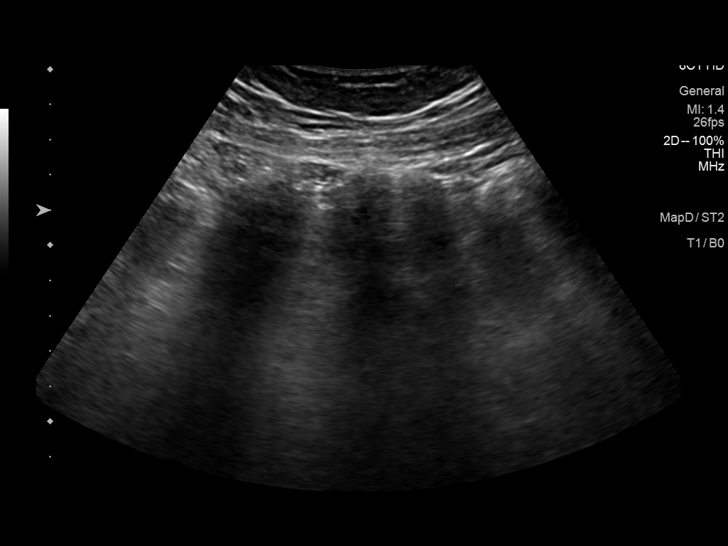
[im 7/19]
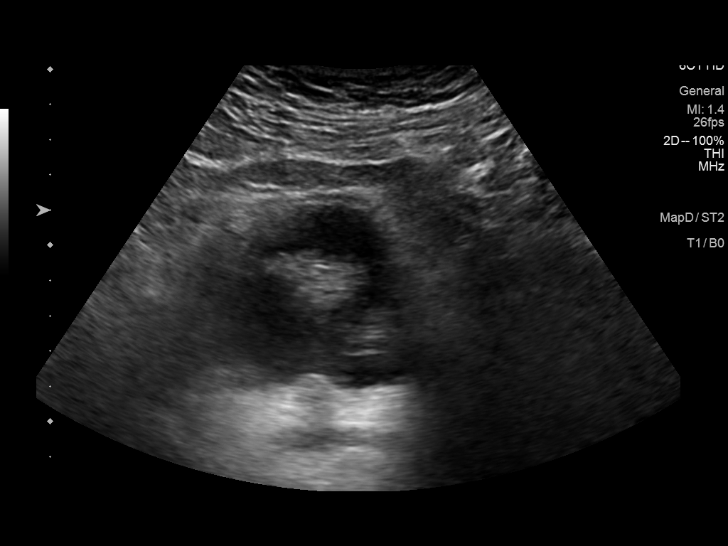
[im 8/19]
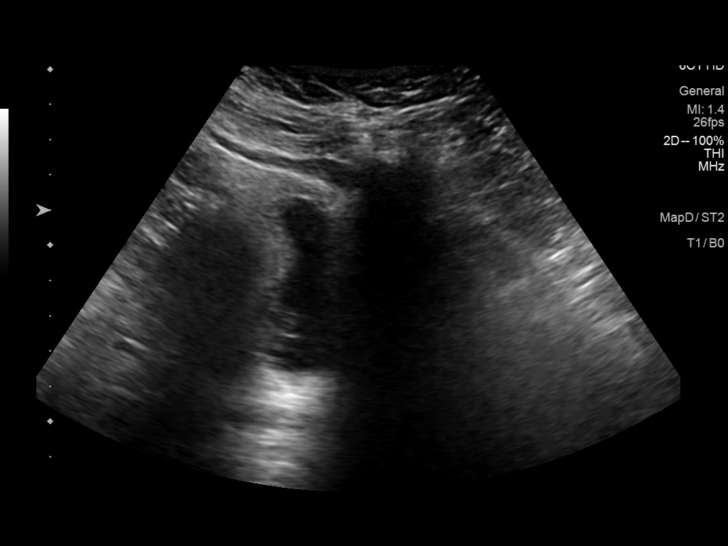
[im 9/19]
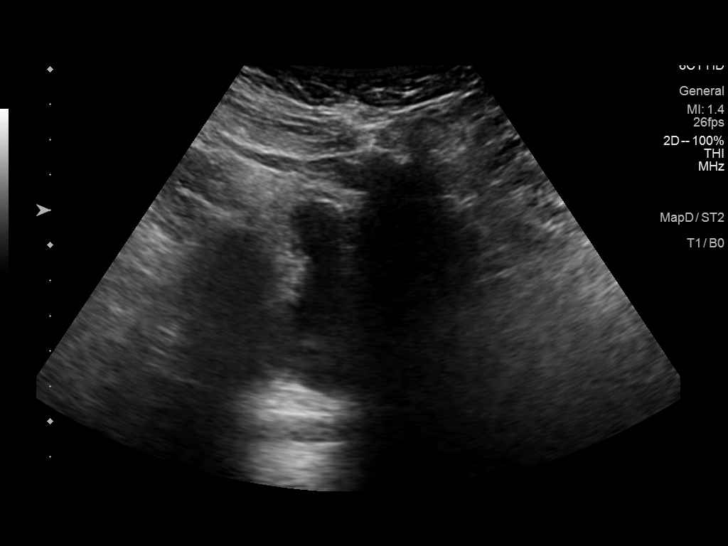
[im 11/19]
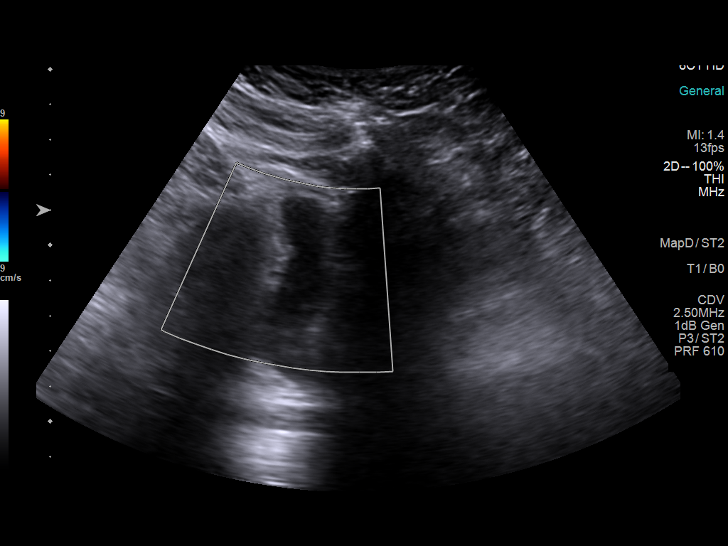
[im 12/19]
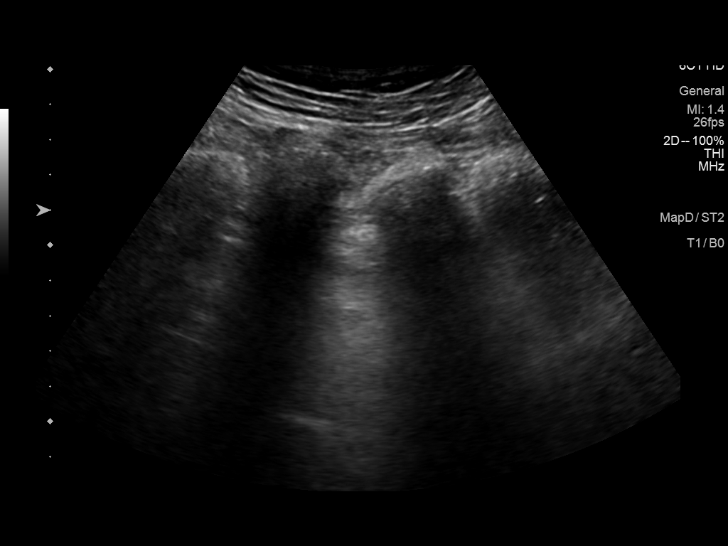
[im 13/19]
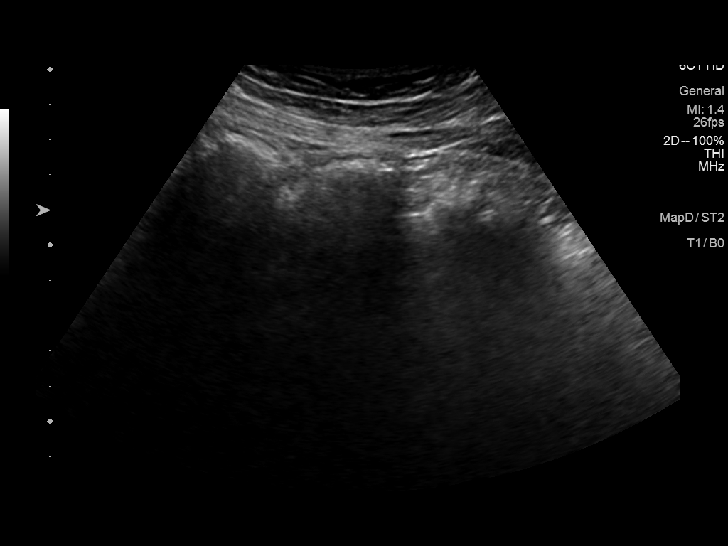
[im 15/19]
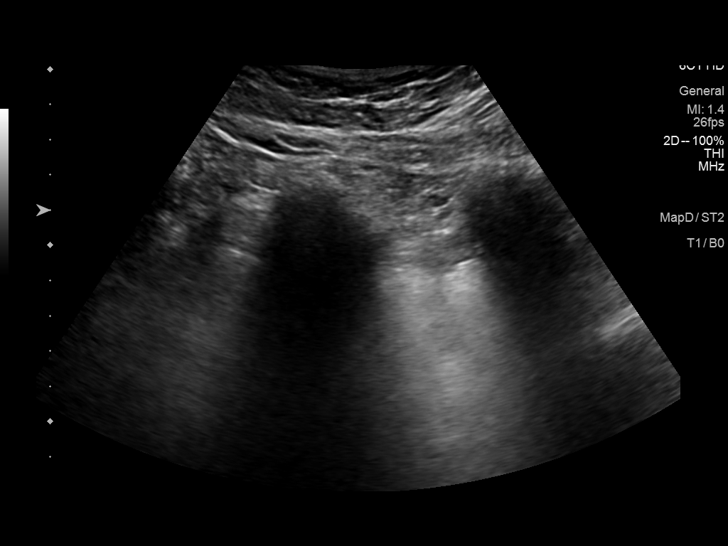
[im 16/19]
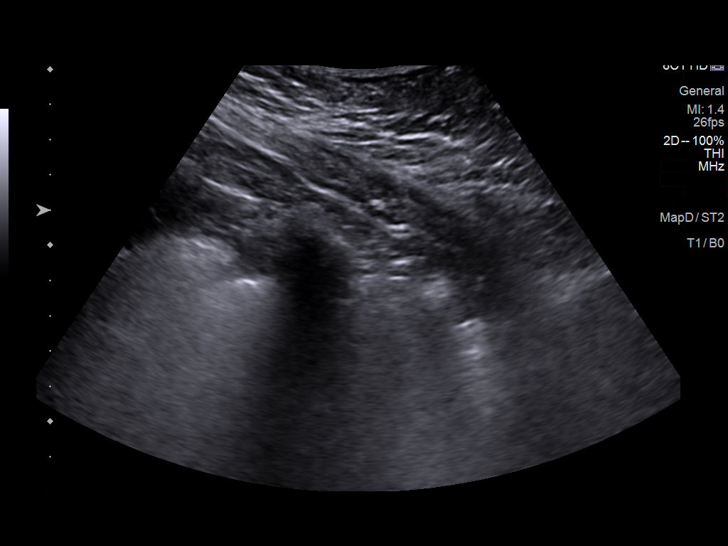
[im 17/19]
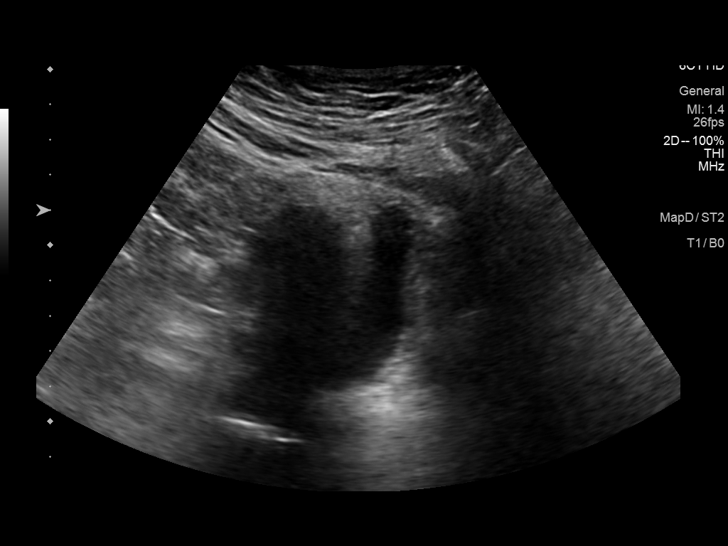
[im 19/19]
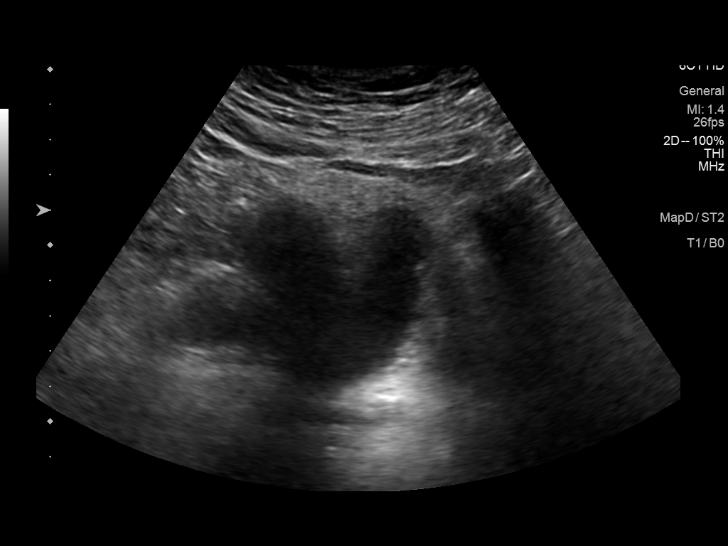

[14 of 19 positions shown; findings below may reference images not displayed]

FINDINGS: Ultrasound performed in the left pelvis at the site of pain. No
abnormality visualized. No visible hernia.
IMPRESSION: No visible abnormality sonographically in the area of pain.

## 2020-02-08 DIAGNOSIS — R1032 Left lower quadrant pain: Secondary | ICD-10-CM

## 2020-02-09 ENCOUNTER — Other Ambulatory Visit: Payer: Self-pay | Admitting: Primary Care

## 2020-02-09 DIAGNOSIS — R1032 Left lower quadrant pain: Secondary | ICD-10-CM

## 2020-02-10 ENCOUNTER — Other Ambulatory Visit: Payer: 59

## 2020-02-13 ENCOUNTER — Other Ambulatory Visit: Payer: Self-pay

## 2020-02-13 ENCOUNTER — Other Ambulatory Visit: Payer: 59 | Admitting: *Deleted

## 2020-02-13 ENCOUNTER — Other Ambulatory Visit: Payer: Self-pay | Admitting: *Deleted

## 2020-02-13 DIAGNOSIS — R1032 Left lower quadrant pain: Secondary | ICD-10-CM

## 2020-02-13 DIAGNOSIS — R1031 Right lower quadrant pain: Secondary | ICD-10-CM

## 2020-02-13 LAB — BASIC METABOLIC PANEL
BUN/Creatinine Ratio: 19 (ref 12–28)
BUN: 13 mg/dL (ref 8–27)
CO2: 22 mmol/L (ref 20–29)
Calcium: 9.8 mg/dL (ref 8.7–10.3)
Chloride: 107 mmol/L — ABNORMAL HIGH (ref 96–106)
Creatinine, Ser: 0.7 mg/dL (ref 0.57–1.00)
GFR calc Af Amer: 107 mL/min/{1.73_m2} (ref >59)
GFR calc non Af Amer: 93 mL/min/{1.73_m2} (ref >59)
Glucose: 106 mg/dL — ABNORMAL HIGH (ref 65–99)
Potassium: 4 mmol/L (ref 3.5–5.2)
Sodium: 140 mmol/L (ref 134–144)

## 2020-02-13 NOTE — Progress Notes (Signed)
Placed order for BMET.  

## 2020-02-13 NOTE — Progress Notes (Signed)
Lab order placed by Julianne Rice, RN

## 2020-02-14 ENCOUNTER — Ambulatory Visit (INDEPENDENT_AMBULATORY_CARE_PROVIDER_SITE_OTHER)
Admission: RE | Admit: 2020-02-14 | Discharge: 2020-02-14 | Disposition: A | Discharge status: Home / Self Care | Payer: 59 | Source: Ambulatory Visit | Attending: Primary Care | Admitting: Primary Care

## 2020-02-14 DIAGNOSIS — R1032 Left lower quadrant pain: Secondary | ICD-10-CM

## 2020-02-14 IMAGING — CT CT ABD-PELV W/ CM
2 of 5 series · 16 of 46 positions shown, 18 images · IV contrast (OMNIPAQUE 300)
Comparison: None.

CLINICAL DATA: Abdominal pain. Left lower quadrant pain for 1 year.

EXAM:
CT ABDOMEN AND PELVIS WITH CONTRAST
TECHNIQUE: Multidetector CT imaging of the abdomen and pelvis was performed
using the standard protocol following bolus administration of
intravenous contrast.
CONTRAST:  100mL OMNIPAQUE IOHEXOL 300 MG/ML  SOLN

[Series 2: abd/pel w · axial · 0.70mm/px · z∈[-510,-95]mm · 13 of 93 slices shown, 15 images]
[im 5/93  soft-tissue]
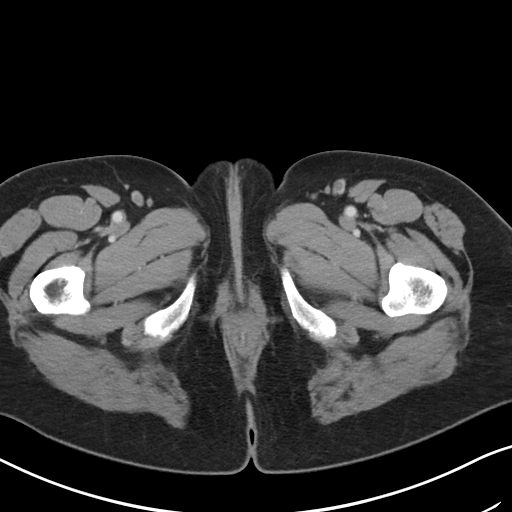
[im 5/93  bone]
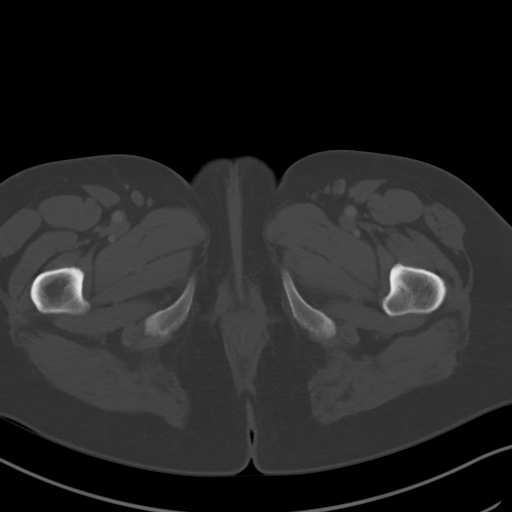
[im 15/93  soft-tissue]
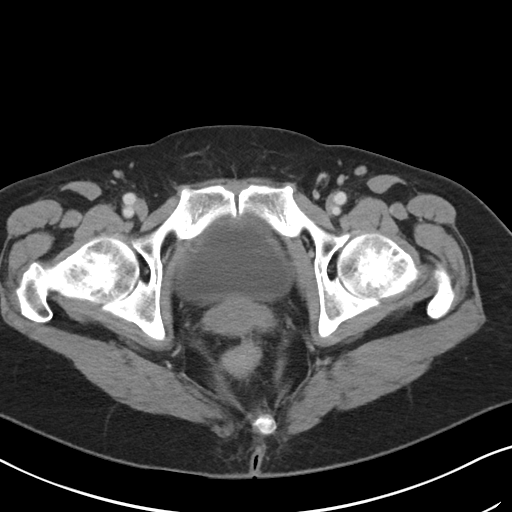
[im 20/93  soft-tissue]
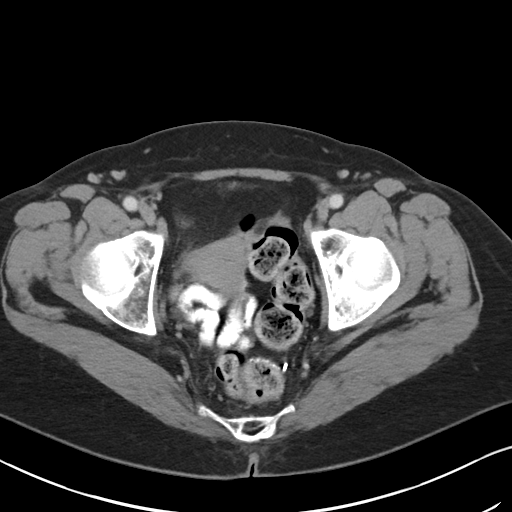
[im 25/93  soft-tissue]
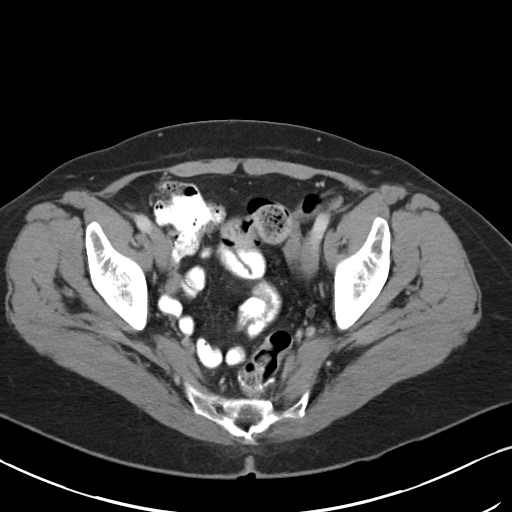
[im 34/93  soft-tissue]
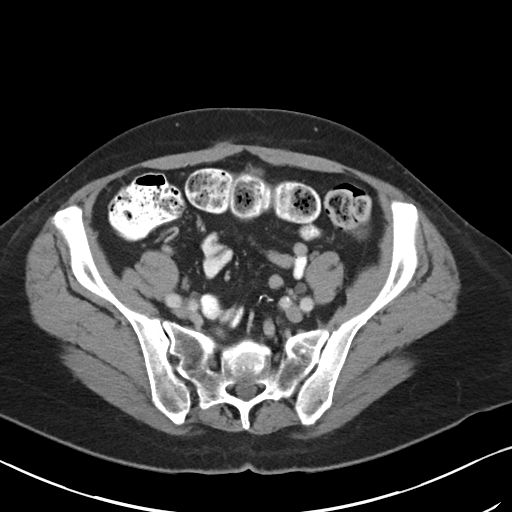
[im 39/93  soft-tissue]
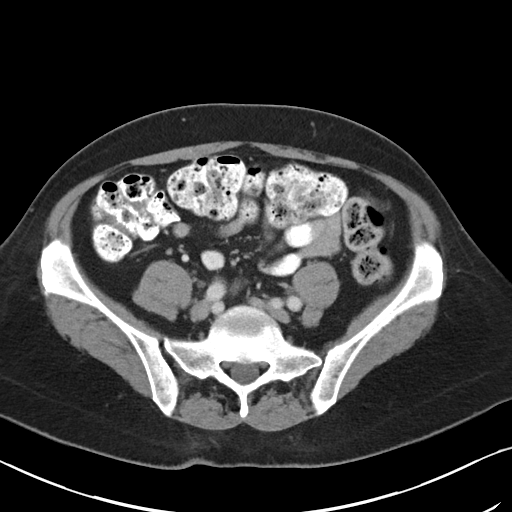
[im 49/93  soft-tissue]
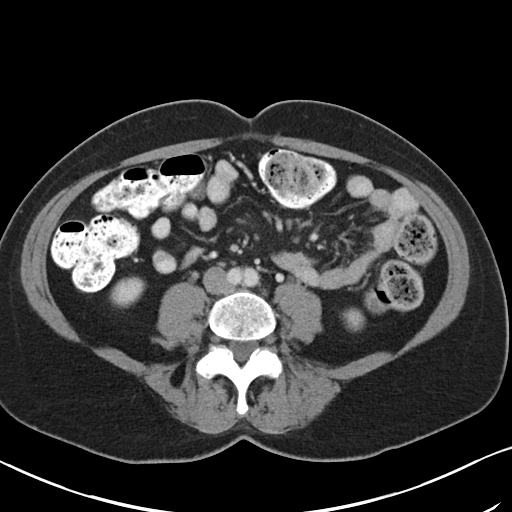
[im 54/93  soft-tissue]
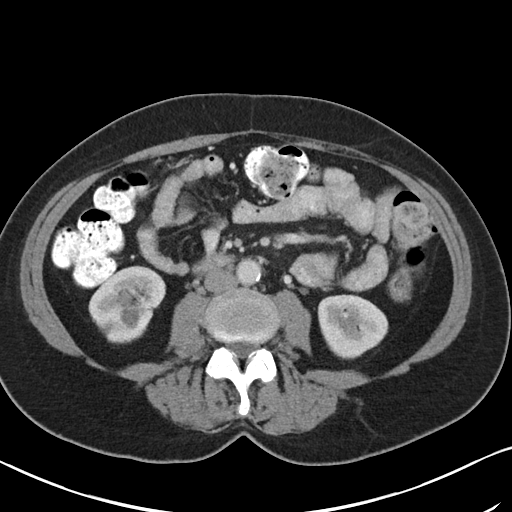
[im 59/93  soft-tissue]
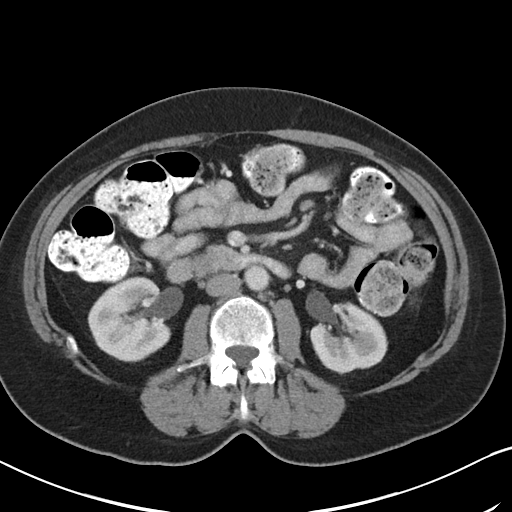
[im 59/93  bone]
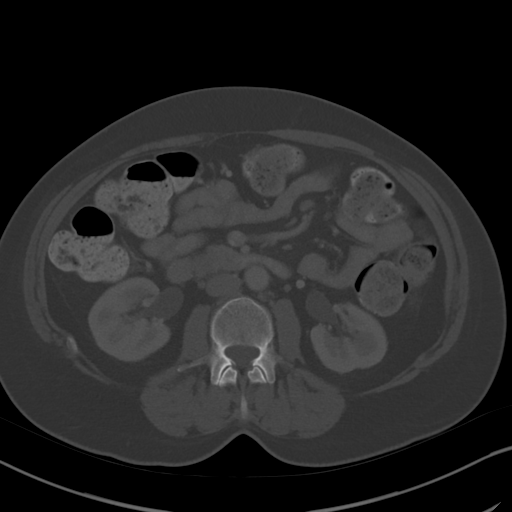
[im 68/93  soft-tissue]
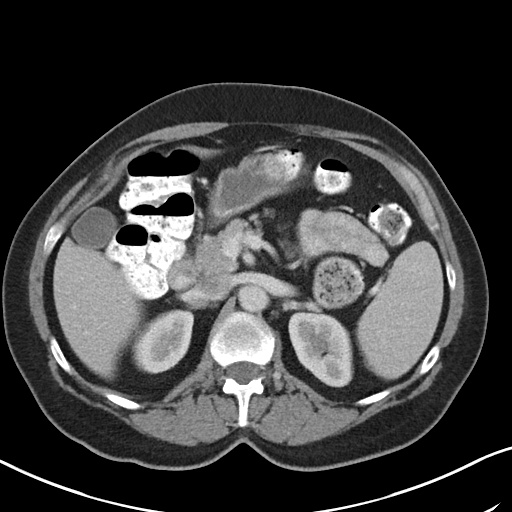
[im 73/93  soft-tissue]
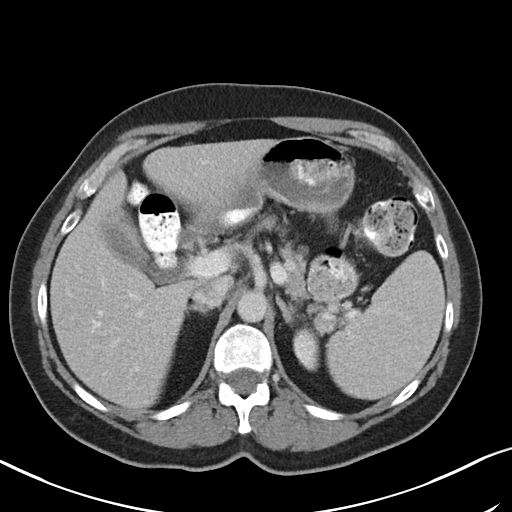
[im 78/93  soft-tissue]
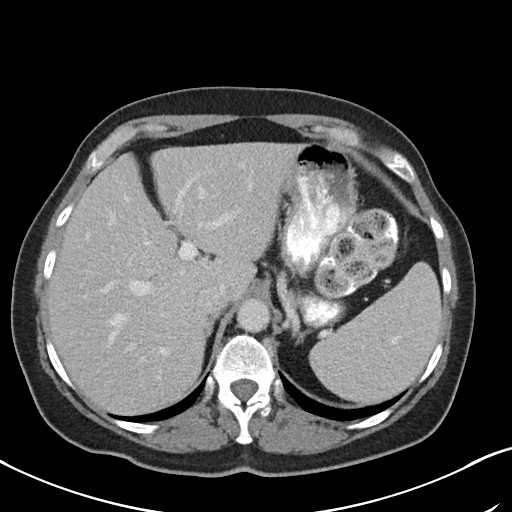
[im 88/93  soft-tissue]
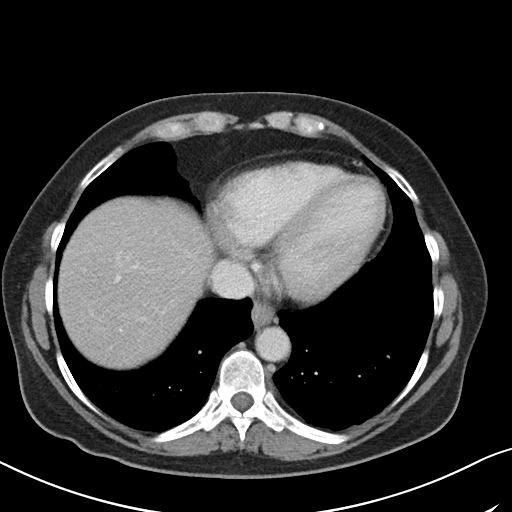

[Series 5: abd/pel w st · coronal · 0.69mm/px · 3 of 83 slices shown]
[im 28/83  soft-tissue]
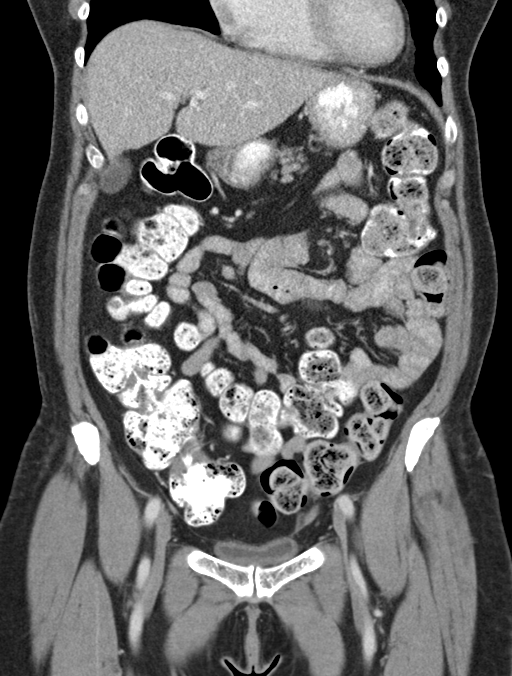
[im 37/83  soft-tissue]
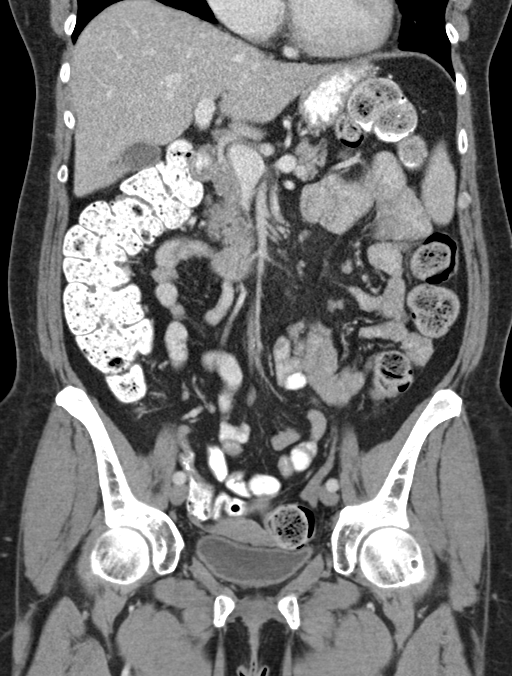
[im 46/83  soft-tissue]
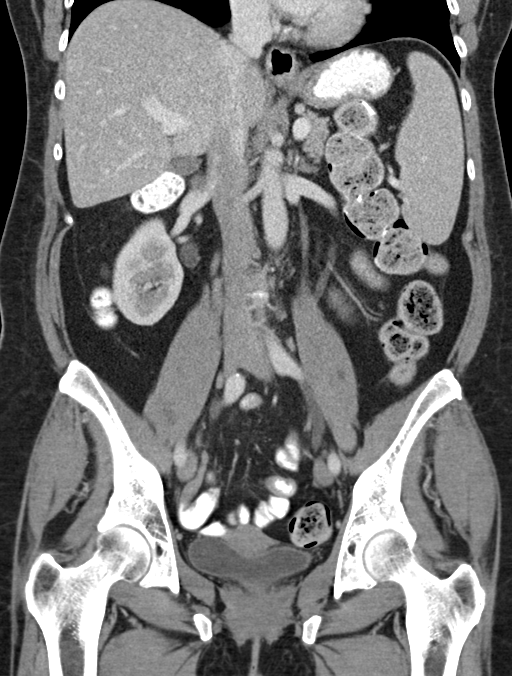

[16 of 46 positions shown; findings below may reference images not displayed]

FINDINGS: Lower chest: No acute abnormality.

Hepatobiliary: No focal liver abnormality is seen. No gallstones,
gallbladder wall thickening, or biliary dilatation.

Pancreas: Unremarkable. No pancreatic ductal dilatation or
surrounding inflammatory changes.

Spleen: The spleen measures 14 by 10.5 by 5.9 cm (volume = 450
cm^3). No focal splenic lesion.

Adrenals/Urinary Tract: Normal appearance of the adrenal glands.
Intermediate attenuating lesion within the inferior pole of the
right kidney measures 1.3 cm in 25 HU, image 41/2. Adjacent simple
appearing cyst measures 1.2 cm and 9 HU, image 39/2. Urinary bladder
unremarkable.

Stomach/Bowel: Stomach is nondistended. No bowel wall thickening,
inflammation or distension. The appendix is visualized and appears
normal. Postop change involving the sigmoid colon noted.

Vascular/Lymphatic: Mild aortic atherosclerosis. No aneurysm. No
abdominopelvic adenopathy.

Reproductive: Uterus and bilateral adnexa are unremarkable.

Other: No abdominal wall hernia or abnormality. No abdominopelvic
ascites.

Musculoskeletal: No acute or significant osseous findings.
IMPRESSION: 1. No acute findings identified within the abdomen or pelvis.
2. Splenomegaly.
3. Complex cyst within the inferior pole of right kidney is
identified measuring 25 HU. Recommend more definitive
characterization with renal protocol abdominal MRI without with
contrast material.
4. Aortic atherosclerosis.

Aortic Atherosclerosis ([B1]-[B1]).

## 2020-02-14 MED ORDER — IOHEXOL 300 MG/ML  SOLN
100.0000 mL | Freq: Once | INTRAMUSCULAR | Status: AC | PRN
  Administered 2020-02-14: 100 mL via INTRAVENOUS

## 2020-02-15 DIAGNOSIS — N281 Cyst of kidney, acquired: Secondary | ICD-10-CM

## 2020-03-20 ENCOUNTER — Ambulatory Visit
Admission: RE | Admit: 2020-03-20 | Discharge: 2020-03-20 | Disposition: A | Discharge status: Home / Self Care | Payer: 59 | Source: Ambulatory Visit | Attending: Primary Care | Admitting: Primary Care

## 2020-03-20 ENCOUNTER — Other Ambulatory Visit: Payer: Self-pay

## 2020-03-20 DIAGNOSIS — N281 Cyst of kidney, acquired: Secondary | ICD-10-CM

## 2020-03-20 IMAGING — MR MR ABDOMEN WO/W CM
16 series · 48 of 48 positions shown · IV contrast (multihance)
Comparison: No prior abdominal MRI. CT the abdomen and pelvis
[DATE].

CLINICAL DATA: 63-year-old female with history of complex cyst in
the inferior pole of the right kidney. Left-sided pain for the past
6 months.

EXAM:
MRI ABDOMEN WITHOUT AND WITH CONTRAST
TECHNIQUE: Multiplanar multisequence MR imaging of the abdomen was performed
both before and after the administration of intravenous contrast.
CONTRAST:  17mL MULTIHANCE GADOBENATE DIMEGLUMINE 529 MG/ML IV SOLN

[Series 5: [id number]_trig · axial · 6.0mm · 1.98mm/px · z∈[-58,+215]mm · 3 of 108 slices shown]
[im 1/108]
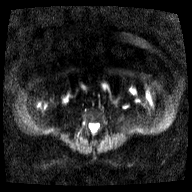
[im 54/108]
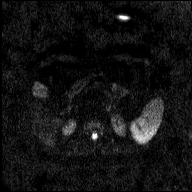
[im 108/108]
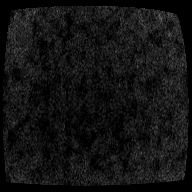

[Series 6: [id number]_trig_adc · axial · 6.0mm · 1.98mm/px · 1 of 36 slices shown]
[im 1/36]
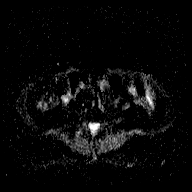

[Series 7: T2 · axial · 6.0mm · 1.33mm/px · 1 of 35 slices shown (1 of 3)]
[im 1/35]
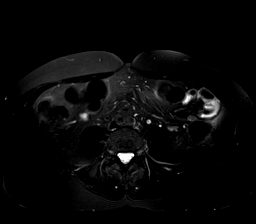

[Series 8: T2 · coronal · 5.0mm · 1.04mm/px · 1 of 34 slices shown (2 of 3)]
[im 1/34]
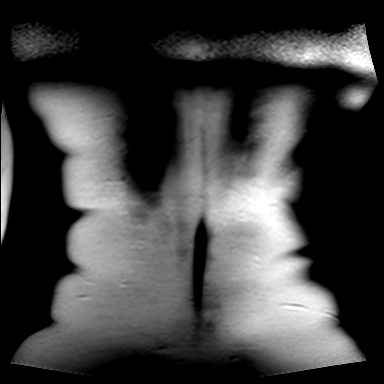

[Series 10: bSSFP · coronal · 4.0mm · 0.99mm/px · 2 of 66 slices shown]
[im 1/66]
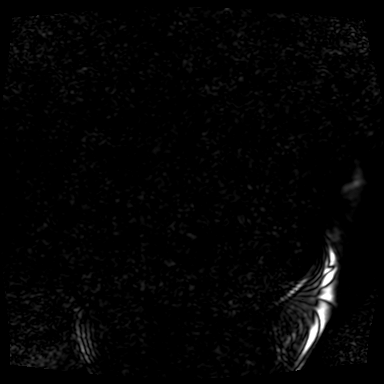
[im 66/66]
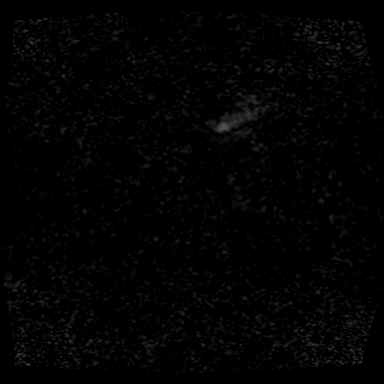

[Series 11: T2 · coronal · 5.0mm · 0.78mm/px · 1 of 46 slices shown (3 of 3)]
[im 1/46]
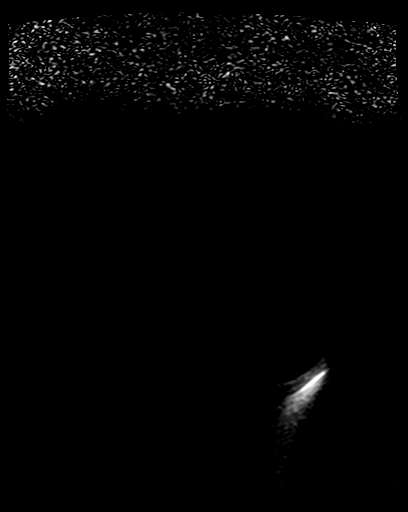

[Series 13: T1 dynamic · axial · non-contrast · 2.3mm · 1.56mm/px · z∈[-70,+204]mm · 4 of 120 slices shown]
[im 1/120]
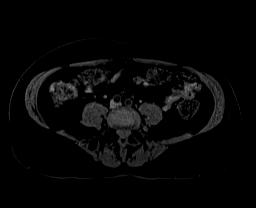
[im 40/120]
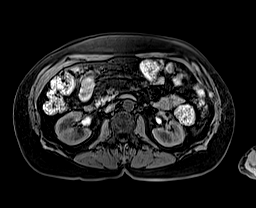
[im 80/120]
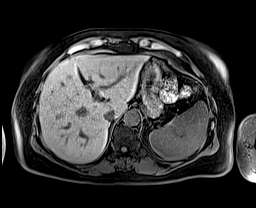
[im 120/120]
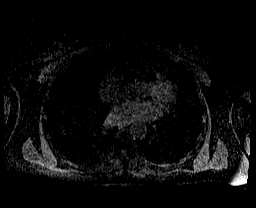

[Series 14: post 25 sec · axial · 2.3mm · 1.56mm/px · z∈[-70,+204]mm · 4 of 120 slices shown]
[im 1/120]
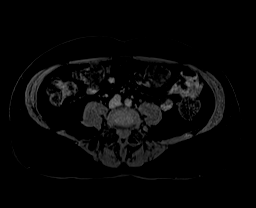
[im 40/120]
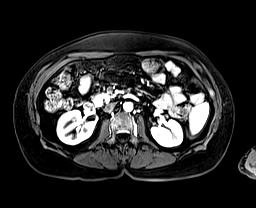
[im 80/120]
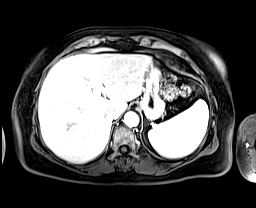
[im 120/120]
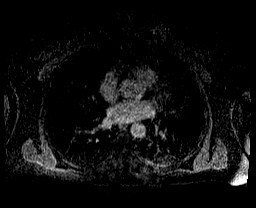

[Series 15: post 25 sec_sub · axial · 2.3mm · 1.56mm/px · z∈[-70,+204]mm · 4 of 120 slices shown]
[im 1/120]
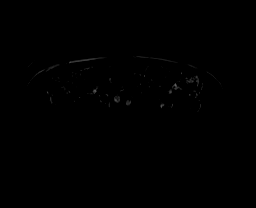
[im 40/120]
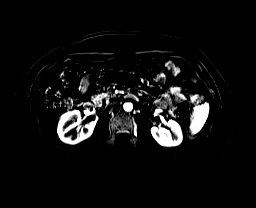
[im 80/120]
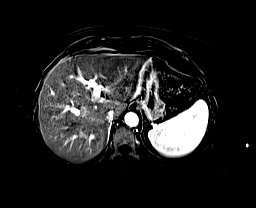
[im 120/120]
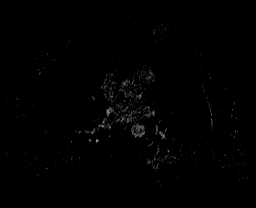

[Series 16: post 45 sec · axial · 2.3mm · 1.56mm/px · z∈[-70,+204]mm · 4 of 120 slices shown]
[im 1/120]
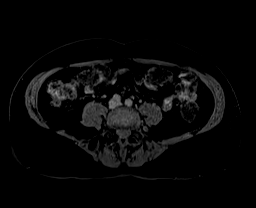
[im 40/120]
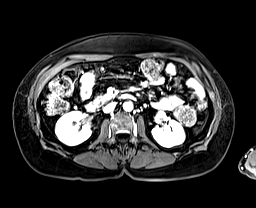
[im 80/120]
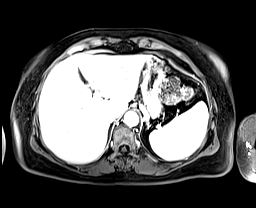
[im 120/120]
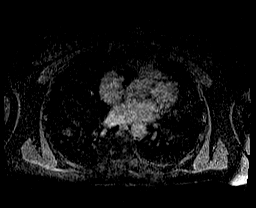

[Series 17: post 45 sec_sub · axial · 2.3mm · 1.56mm/px · z∈[-70,+204]mm · 4 of 120 slices shown]
[im 1/120]
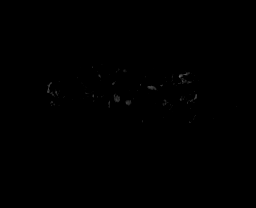
[im 40/120]
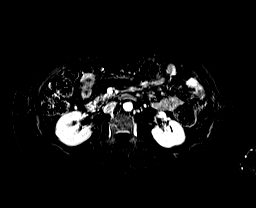
[im 80/120]
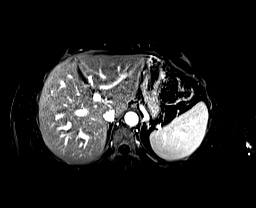
[im 120/120]
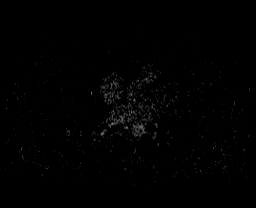

[Series 18: post 90 sec · axial · 2.3mm · 1.56mm/px · z∈[-70,+204]mm · 4 of 120 slices shown]
[im 1/120]
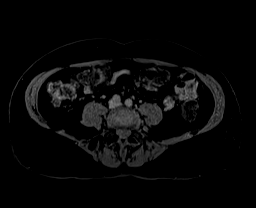
[im 40/120]
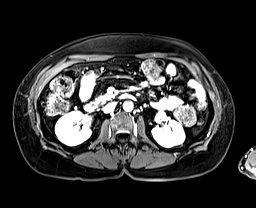
[im 80/120]
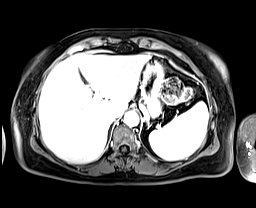
[im 120/120]
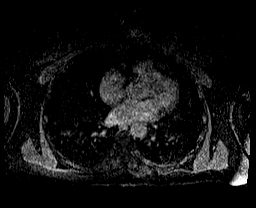

[Series 19: post 90 sec_sub · axial · 2.3mm · 1.56mm/px · z∈[-70,+204]mm · 4 of 120 slices shown]
[im 1/120]
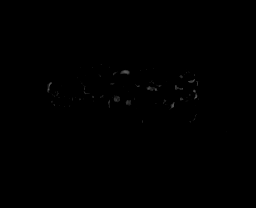
[im 40/120]
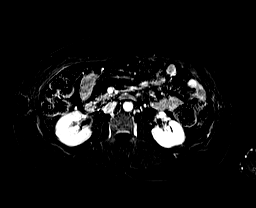
[im 80/120]
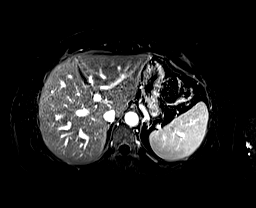
[im 120/120]
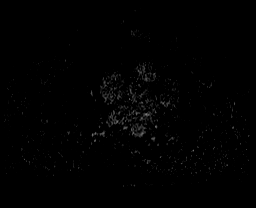

[Series 20: T1 dynamic post-contrast · coronal · 2.6mm · 0.78mm/px · 3 of 88 slices shown]
[im 1/88]
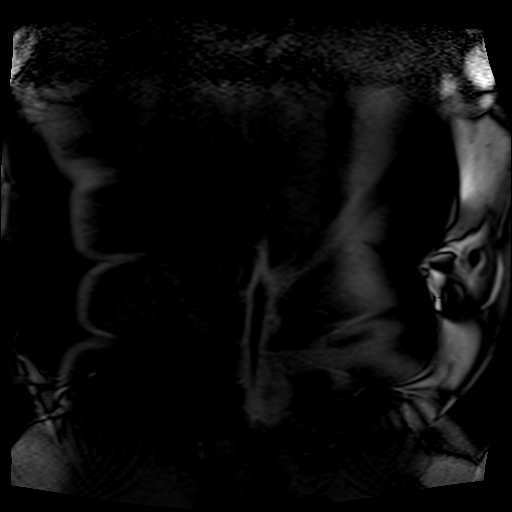
[im 44/88]
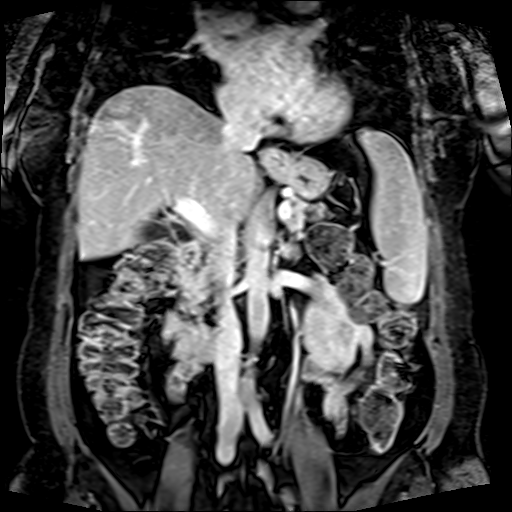
[im 88/88]
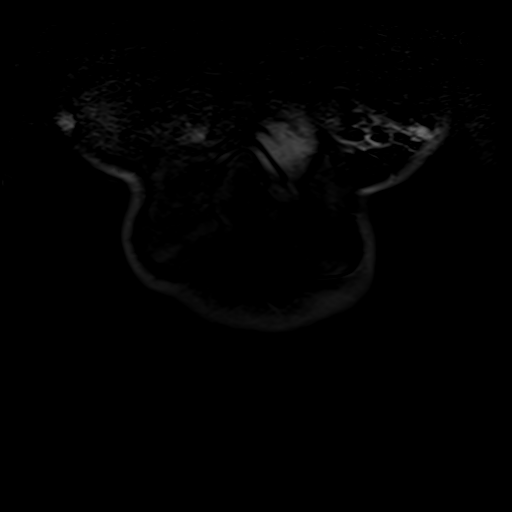

[Series 21: post axial 3+ · axial · 2.3mm · 1.56mm/px · z∈[-70,+204]mm · 4 of 120 slices shown (1 of 2)]
[im 1/120]
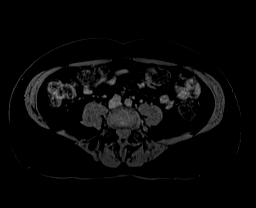
[im 40/120]
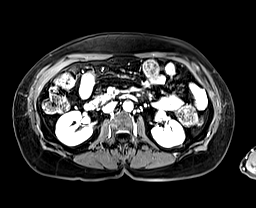
[im 80/120]
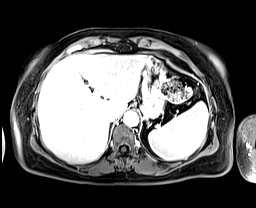
[im 120/120]
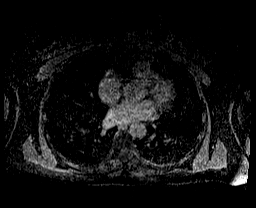

[Series 22: post axial 3+ · axial · 2.3mm · 1.56mm/px · z∈[-70,+204]mm · 4 of 120 slices shown (2 of 2)]
[im 1/120]
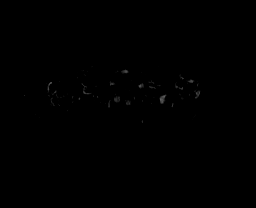
[im 40/120]
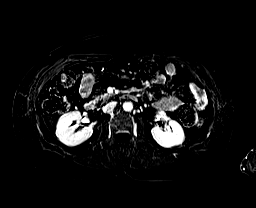
[im 80/120]
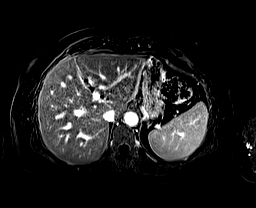
[im 120/120]
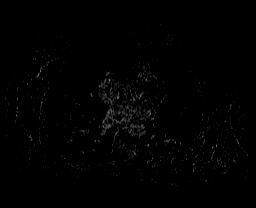

[48 of 48 positions shown; findings below may reference images not displayed]

FINDINGS: Lower chest: Unremarkable.

Hepatobiliary: No suspicious cystic or solid hepatic lesions. No
intra or extrahepatic biliary ductal dilatation. Gallbladder is
normal in appearance.

Pancreas: No pancreatic mass. No pancreatic ductal dilatation. No
pancreatic or peripancreatic fluid collections or inflammatory
changes.

Spleen: Spleen is enlarged measuring 10.9 x 5.6 x 14.1 cm (estimated
splenic volume of 430 mL) .

Adrenals/Urinary Tract: There are 2 lesions in the lower pole of the
right kidney which both demonstrate low T1 signal intensity, high T2
signal intensity, and do not enhance, compatible with simple cysts.
Left kidney and bilateral adrenal glands are normal in appearance.
No hydroureteronephrosis in the visualized portions of the abdomen.

Stomach/Bowel: Visualized portions are unremarkable.

Vascular/Lymphatic: No aneurysm identified in the visualized
abdominal vasculature. No lymphadenopathy noted in the abdomen.

Other: No significant volume of ascites noted in the visualized
portions of the peritoneal cavity.

Musculoskeletal: No aggressive appearing osseous lesions are noted
in the visualized portions of the skeleton.
IMPRESSION: 1. The lesions of concern in the right kidney are both compatible
with simple cysts.
2. Mild splenomegaly.

## 2020-03-20 MED ORDER — GADOBENATE DIMEGLUMINE 529 MG/ML IV SOLN
17.0000 mL | Freq: Once | INTRAVENOUS | Status: AC | PRN
  Administered 2020-03-20: 17 mL via INTRAVENOUS

## 2020-03-20 MED ORDER — GADOBENATE DIMEGLUMINE 529 MG/ML IV SOLN: 18.0000 mL | Freq: Once | INTRAVENOUS | Status: DC | PRN

## 2020-05-23 ENCOUNTER — Ambulatory Visit: Payer: 59 | Admitting: Primary Care

## 2020-05-25 ENCOUNTER — Encounter: Payer: Self-pay | Admitting: Primary Care

## 2020-05-25 ENCOUNTER — Ambulatory Visit (INDEPENDENT_AMBULATORY_CARE_PROVIDER_SITE_OTHER): Payer: 59 | Admitting: Primary Care

## 2020-05-25 ENCOUNTER — Other Ambulatory Visit: Payer: Self-pay

## 2020-05-25 DIAGNOSIS — R1030 Lower abdominal pain, unspecified: Secondary | ICD-10-CM | POA: Diagnosis not present

## 2020-05-25 NOTE — Patient Instructions (Signed)
Try Miralax a few times weekly to help with movements.   Ensure you are consuming 64 ounces of water daily.  Incorporate fiber in your diet.  It was a pleasure to see you today!   High-Fiber Diet Fiber, also called dietary fiber, is a type of carbohydrate that is found in fruits, vegetables, whole grains, and beans. A high-fiber diet can have many health benefits. Your health care provider may recommend a high-fiber diet to help:  Prevent constipation. Fiber can make your bowel movements more regular.  Lower your cholesterol.  Relieve the following conditions: ? Swelling of veins in the anus (hemorrhoids). ? Swelling and irritation (inflammation) of specific areas of the digestive tract (uncomplicated diverticulosis). ? A problem of the large intestine (colon) that sometimes causes pain and diarrhea (irritable bowel syndrome, IBS).  Prevent overeating as part of a weight-loss plan.  Prevent heart disease, type 2 diabetes, and certain cancers. What is my plan? The recommended daily fiber intake in grams (g) includes:  38 g for men age 79 or younger.  30 g for men over age 70.  47 g for women age 21 or younger.  21 g for women over age 54. You can get the recommended daily intake of dietary fiber by:  Eating a variety of fruits, vegetables, grains, and beans.  Taking a fiber supplement, if it is not possible to get enough fiber through your diet. What do I need to know about a high-fiber diet?  It is better to get fiber through food sources rather than from fiber supplements. There is not a lot of research about how effective supplements are.  Always check the fiber content on the nutrition facts label of any prepackaged food. Look for foods that contain 5 g of fiber or more per serving.  Talk with a diet and nutrition specialist (dietitian) if you have questions about specific foods that are recommended or not recommended for your medical condition, especially if those  foods are not listed below.  Gradually increase how much fiber you consume. If you increase your intake of dietary fiber too quickly, you may have bloating, cramping, or gas.  Drink plenty of water. Water helps you to digest fiber. What are tips for following this plan?  Eat a wide variety of high-fiber foods.  Make sure that half of the grains that you eat each day are whole grains.  Eat breads and cereals that are made with whole-grain flour instead of refined flour or white flour.  Eat brown rice, bulgur wheat, or millet instead of white rice.  Start the day with a breakfast that is high in fiber, such as a cereal that contains 5 g of fiber or more per serving.  Use beans in place of meat in soups, salads, and pasta dishes.  Eat high-fiber snacks, such as berries, raw vegetables, nuts, and popcorn.  Choose whole fruits and vegetables instead of processed forms like juice or sauce. What foods can I eat?  Fruits Berries. Pears. Apples. Oranges. Avocado. Prunes and raisins. Dried figs. Vegetables Sweet potatoes. Spinach. Kale. Artichokes. Cabbage. Broccoli. Cauliflower. Green peas. Carrots. Squash. Grains Whole-grain breads. Multigrain cereal. Oats and oatmeal. Brown rice. Barley. Bulgur wheat. [place]. Quinoa. Bran muffins. Popcorn. Rye wafer crackers. Meats and other proteins Navy, kidney, and pinto beans. Soybeans. Split peas. Lentils. Nuts and seeds. Dairy Fiber-fortified yogurt. Beverages Fiber-fortified soy milk. Fiber-fortified orange juice. Other foods Fiber bars. The items listed above may not be a complete list of recommended foods and  beverages. Contact a dietitian for more options. What foods are not recommended? Fruits Fruit juice. Cooked, strained fruit. Vegetables Fried potatoes. Canned vegetables. Well-cooked vegetables. Grains White bread. Pasta made with refined flour. White rice. Meats and other proteins Fatty cuts of meat. Fried chicken or fried  fish. Dairy Milk. Yogurt. Cream cheese. Sour cream. Fats and oils Butters. Beverages Soft drinks. Other foods Cakes and pastries. The items listed above may not be a complete list of foods and beverages to avoid. Contact a dietitian for more information. Summary  Fiber is a type of carbohydrate. It is found in fruits, vegetables, whole grains, and beans.  There are many health benefits of eating a high-fiber diet, such as preventing constipation, lowering blood cholesterol, helping with weight loss, and reducing your risk of heart disease, diabetes, and certain cancers.  Gradually increase your intake of fiber. Increasing too fast can result in cramping, bloating, and gas. Drink plenty of water while you increase your fiber.  The best sources of fiber include whole fruits and vegetables, whole grains, nuts, seeds, and beans. This information is not intended to replace advice given to you by your health care provider. Make sure you discuss any questions you have with your health care provider. Document Revised: 07/27/2017 Document Reviewed: 07/27/2017 Elsevier Patient Education  2020 [employer].

## 2020-05-25 NOTE — Progress Notes (Signed)
Subjective:    Patient ID: Heather Hudson, female    DOB: 1955/11/28, 64 y.o.   MRN: [id number]  HPI  This visit occurred during the SARS-CoV-2 public health emergency.  Safety protocols were in place, including screening questions prior to the visit, additional usage of staff PPE, and extensive cleaning of exam room while observing appropriate contact time as indicated for disinfecting solutions.   Heather Hudson is a 64 year old female with a history of hyperlipidemia, seasonal allergies, motion sickness, chronic lower abdominal pain who presents today to discuss chronic abdominal pain.  She was initially evaluated for this in March 2021 when she endorsed intermittent LLQ abdominal pain/discomfort for the last year, but more consistently over the last month. During that time we completed lab work and an abdominal ultrasound which were grossly unremarkable.   She notified us in May 2021 that her pain had continued, so we completed CT abdomen/pelvis with contrast which was without acute abdominal findings but showed splenomegaly and "complex" right renal cyst. MRI was completed one month later which showed mild splenomegaly and simple cysts to the right kidney.  Today she continues to experience LLQ/left later abdomen/side "discomfort" for which she experiences intermittently and sporadically lasting for about one hour. She describes her pain as an "ache", like "my ovaries are contracting", really doesn't feel like it's pain. Her discomfort is occurring a few times weekly which doesn't interrupt her day activities. She denies nausea, vomiting, fevers, diarrhea, night sweats, unexplained weight loss.   She has a long history of irregular bowel movements and is having bowel movements once to twice weekly, sometimes three times small movements three times daily. She is not on a bowel regimen. During prior colonoscopies she's had to complete to rounds of prep due to constipation and failed prep treatments.    BP Readings from Last 3 Encounters:  05/25/20 122/82  12/23/19 126/86  11/22/18 124/80     Review of Systems  Constitutional: Negative for fever and unexpected weight change.  Gastrointestinal: Positive for abdominal pain and constipation. Negative for blood in stool and diarrhea.  Genitourinary: Negative for dysuria and vaginal discharge.       Past Medical History:  Diagnosis Date   Environmental and seasonal allergies    Migraines    Motion sickness      Social History   Socioeconomic History   Marital status: Married    Spouse name: Not on file   Number of children: Not on file   Years of education: Not on file   Highest education level: Not on file  Occupational History   Not on file  Tobacco Use   Smoking status: Never Smoker   Smokeless tobacco: Never Used  Substance and Sexual Activity   Alcohol use: Yes    Alcohol/week: 1.0 standard drink    Types: 1 Glasses of wine per week   Drug use: No   Sexual activity: Not on file  Other Topics Concern   Not on file  Social History Narrative   Married.   Moved from [place].   No children.   Works in [employer].    Enjoys wine tasting.    Social Determinants of Health   Financial Resource Strain:    Difficulty of Paying Living Expenses: Not on file  Food Insecurity:    Worried About Charity fundraiser in the Last Year: Not on file   [employer] of Food in the Last Year: Not on file  Transportation  Needs:    Lack of Transportation (Medical): Not on file   Lack of Transportation (Non-Medical): Not on file  Physical Activity:    Days of Exercise per Week: Not on file   Minutes of Exercise per Session: Not on file  Stress:    Feeling of Stress : Not on file  Social Connections:    Frequency of Communication with Friends and Family: Not on file   Frequency of Social Gatherings with Friends and Family: Not on file   Attends Religious Services: Not on file   Active  Member of Clubs or Organizations: Not on file   Attends Archivist Meetings: Not on file   Marital Status: Not on file  Intimate Partner Violence:    Fear of Current or Ex-Partner: Not on file   Emotionally Abused: Not on file   Physically Abused: Not on file   Sexually Abused: Not on file    No past surgical history on file.  No family history on file.  Allergies  Allergen Reactions   Aspirin Hives   Ibuprofen Hives   Other     Anesthesia (familial) SISTER HAD MALIGNANT HYPERTHERMIA   Propofol Other (See Comments)    No current outpatient medications on file prior to visit.   No current facility-administered medications on file prior to visit.    BP 122/82    Pulse 63    Temp (!) 95.7 F (35.4 C) (Temporal)    Ht 5\' 7"  (1.702 m)    Wt 183 lb 8 oz (83.2 kg)    SpO2 98%    BMI 28.74 kg/m    Objective:   Physical Exam Constitutional:      Appearance: She is not ill-appearing.  Cardiovascular:     Rate and Rhythm: Normal rate and regular rhythm.  Pulmonary:     Effort: Pulmonary effort is normal.     Breath sounds: Normal breath sounds.  Abdominal:     General: Abdomen is flat. Bowel sounds are normal.     Palpations: Abdomen is soft. There is no mass.     Tenderness: There is no abdominal tenderness.  Musculoskeletal:     Cervical back: Neck supple.  Skin:    General: Skin is warm and dry.            Assessment & Plan:

## 2020-05-25 NOTE — Assessment & Plan Note (Signed)
Chronic and continued, denies actual pain, mostly seems worried about the sensation that she notices. Exam today benign. Prior work up was all benign.  I suspect that she may have moderate stool burden within the colon that is causing sensation. Especially given her prior history with colonoscopy preparations and irregular bowel movements.  Today we decided that she would keep a journal of her discomfort and bowel movements. We will also start Miralax 2-3 times weekly, increase fiber in the diet, increase water.   She will update.

## 2020-06-06 DIAGNOSIS — U071 COVID-19: Secondary | ICD-10-CM

## 2020-06-06 HISTORY — DX: COVID-19: U07.1

## 2020-09-18 ENCOUNTER — Telehealth: Payer: Self-pay | Admitting: Internal Medicine

## 2020-11-15 ENCOUNTER — Ambulatory Visit (INDEPENDENT_AMBULATORY_CARE_PROVIDER_SITE_OTHER): Payer: 59 | Admitting: Internal Medicine

## 2020-11-15 ENCOUNTER — Encounter: Payer: Self-pay | Admitting: Internal Medicine

## 2020-11-15 VITALS — BP 126/84 | HR 74 | Ht 67.0 in | Wt 184.2 lb

## 2020-11-15 DIAGNOSIS — R161 Splenomegaly, not elsewhere classified: Secondary | ICD-10-CM

## 2020-11-15 DIAGNOSIS — R1032 Left lower quadrant pain: Secondary | ICD-10-CM | POA: Diagnosis not present

## 2020-11-15 DIAGNOSIS — Z86010 Personal history of colonic polyps: Secondary | ICD-10-CM | POA: Diagnosis not present

## 2020-11-15 MED ORDER — PEG-KCL-NACL-NASULF-NA ASC-C 100 G PO SOLR
1.0000 | ORAL | 0 refills | Status: DC
Start: 2020-11-15 — End: 2021-01-07

## 2020-11-15 NOTE — Patient Instructions (Signed)
Normal BMI (Body Mass Index- based on height and weight) is between 19 and 25. Your BMI today is Body mass index is 28.85 kg/m. Marland Kitchen Please consider follow up  regarding your BMI with your Primary Care Provider.  You have been scheduled for a colonoscopy. Please follow written instructions given to you at your visit today.  Please pick up your prep supplies at the pharmacy within the next 1-3 days. If you use inhalers (even only as needed), please bring them with you on the day of your procedure.  Due to recent changes in healthcare laws, you may see the results of your imaging and laboratory studies on MyChart before your provider has had a chance to review them.  We understand that in some cases there may be results that are confusing or concerning to you. Not all laboratory results come back in the same time frame and the provider may be waiting for multiple results in order to interpret others.  Please give [place] 48 hours in order for your provider to thoroughly review all the results before contacting the office for clarification of your results.   I appreciate the opportunity to care for you. Silvano Rusk, MD, [facility]

## 2020-11-15 NOTE — Progress Notes (Signed)
Heather Hudson 65 y.o. 02-03-1956 [id number]  Assessment & Plan:   Encounter Diagnoses  Name Primary?  Marland Kitchen LLQ pain Yes  . Hx of adenomatous polyp of colon   . Splenomegaly-mild by CT and MR    I think the left lower quadrant pain could be scar tissue from prior colectomy, otherwise perhaps related to splenomegaly.  Splenomegaly is mild.  Platelet count is normal.  Would observe that now.  \My current plan is to perform a colonoscopy mainly given the history of adenomatous colon polyp that puts her in the range to be repeated at this time for surveillance.  We will have her use a MiraLAX purge the day before prep and then use movie prep generic the day for prep.  That should be enough flushes had a segmental resection.  The risks and benefits as well as alternatives of endoscopic procedure(s) have been discussed and reviewed. All questions answered. The patient agrees to proceed.  She has not received vaccination for Covid though she is immune because of Covid infection in fall 2021, but our protocol calls for preprocedural Covid testing.  I appreciate the opportunity to care for this patient. CC: [name], NP   Subjective:   Chief Complaint: Left lower quadrant pain  HPI Heather Hudson is a 65 year old white woman here because of left lower quadrant pain problems that persist.  This is in the background of prior laparoscopic assisted colovesical fistula resection with segmental resection of the colon in [place] in 2015, history of small adenomatous polyp on preoperative colonoscopy at the same time.  For some time now probably more than a year she has been having a left lower quadrant discomfort.  She had seen Allie Bossier, NP about this and has had a work-up that has included ultrasound December 29, 2019, CT abdomen pelvis with contrast Feb 14, 2020 and MRI abdomen with and without contrast March 20, 2020.  These results are reviewed and the left pelvic ultrasound was negative.  The CT  scan demonstrated a complex cyst in the inferior pole of the right kidney and some splenomegaly.  The MRI demonstrated that the kidney lesions are simple cysts.  Mild splenomegaly was confirmed.  She has chronically mildly irregular bowel habits without any significant change though she does describe some incomplete defecation and extra bowel movements some days which is new I suppose so there is a little bit of change.  She reports that when she had her colonoscopy in 2015 the first time the prep failed so they gave her a double prep.  She did have a redundant colon.  Those records are reviewed.  All other GI review of systems are negative. Allergies  Allergen Reactions  . Aspirin Hives  . Ibuprofen Hives  . Other     Anesthesia (familial) SISTER HAD MALIGNANT HYPERTHERMIA  . Propofol Other (See Comments)   Current Meds  Medication Sig  . peg 3350 powder (MOVIPREP) 100 g SOLR Take 1 kit (200 g total) by mouth as directed.   Past Medical History:  Diagnosis Date  . Adenomatous colon polyp 2015  . Colovesical fistula 2015  . COVID-19 06/2020  . Environmental and seasonal allergies   . Migraines   . Motion sickness    Past Surgical History:  Procedure Laterality Date  . COLONOSCOPY  2015   Plus others  . LAPAROSCOPIC LEFT COLON RESECTION  2015   Colovesical fistula, [place]  . [place] EXTRACTION     Social History   Social  History Narrative   Married.  No children.   Moved from [place] in 2018    Worked in [employer], [employer]ud[employer].  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   Enjoys wine tasting.    1-2 caffeinated drinks a day 0-1 alcoholic never smoker no drug use   Sister Di Kindle works Financial controller endoscopy as RN   family history includes Diabetes in her brother and mother; Heart disease in her father and mother; Malignant hyperthermia in her sister; Pancreatic cancer in  her maternal aunt; Prostate cancer in her father.   Review of Systems As per HPI.  All other review of systems are negative.  Objective:   Physical Exam BP 126/84   Pulse 74   Ht '5\' 7"'  (1.702 m)   Wt 184 lb 3.2 oz (83.6 kg)   SpO2 98%   BMI 28.85 kg/m  NAD [employer] Anicteric Lungs cta Cor NL S1S2 no rmg abd is soft with some tenderness to deep palpation LLQ and ? SQ thickening ? Scar  No hsm, mass or hernia and neg Carnett's I do not detect any splenomegaly to be specific No pain with twisting torso or hip manipulation rectal is deferred A and o x 3  Data reviewed includes primary care notes here in [place] as well as imaging as reflected in the HPI and labs also.  I have also reviewed 2015 surgical records op notes postop follow-ups colonoscopy reports and pathology reports from [place] that were seen through care everywhere.

## 2021-01-07 ENCOUNTER — Ambulatory Visit (INDEPENDENT_AMBULATORY_CARE_PROVIDER_SITE_OTHER)
Admission: RE | Admit: 2021-01-07 | Discharge: 2021-01-07 | Disposition: A | Discharge status: Home / Self Care | Payer: 59 | Source: Ambulatory Visit | Attending: Internal Medicine | Admitting: Internal Medicine

## 2021-01-07 ENCOUNTER — Encounter: Payer: Self-pay | Admitting: Internal Medicine

## 2021-01-07 ENCOUNTER — Ambulatory Visit (AMBULATORY_SURGERY_CENTER): Payer: 59 | Admitting: Internal Medicine

## 2021-01-07 ENCOUNTER — Other Ambulatory Visit: Payer: Self-pay

## 2021-01-07 VITALS — BP 161/94 | HR 62 | Temp 96.8°F | Resp 18 | Ht 67.0 in | Wt 184.0 lb

## 2021-01-07 DIAGNOSIS — R1084 Generalized abdominal pain: Secondary | ICD-10-CM | POA: Diagnosis not present

## 2021-01-07 DIAGNOSIS — D123 Benign neoplasm of transverse colon: Secondary | ICD-10-CM

## 2021-01-07 DIAGNOSIS — Z86010 Personal history of colonic polyps: Secondary | ICD-10-CM

## 2021-01-07 IMAGING — DX DG ABDOMEN ACUTE W/ 1V CHEST
3 series · 3 of 3 positions shown · non-contrast
Comparison: None.

CLINICAL DATA: Abdominal pain, status post colonoscopy

EXAM:
DG ABDOMEN ACUTE WITH 1 VIEW CHEST

[chest pa]
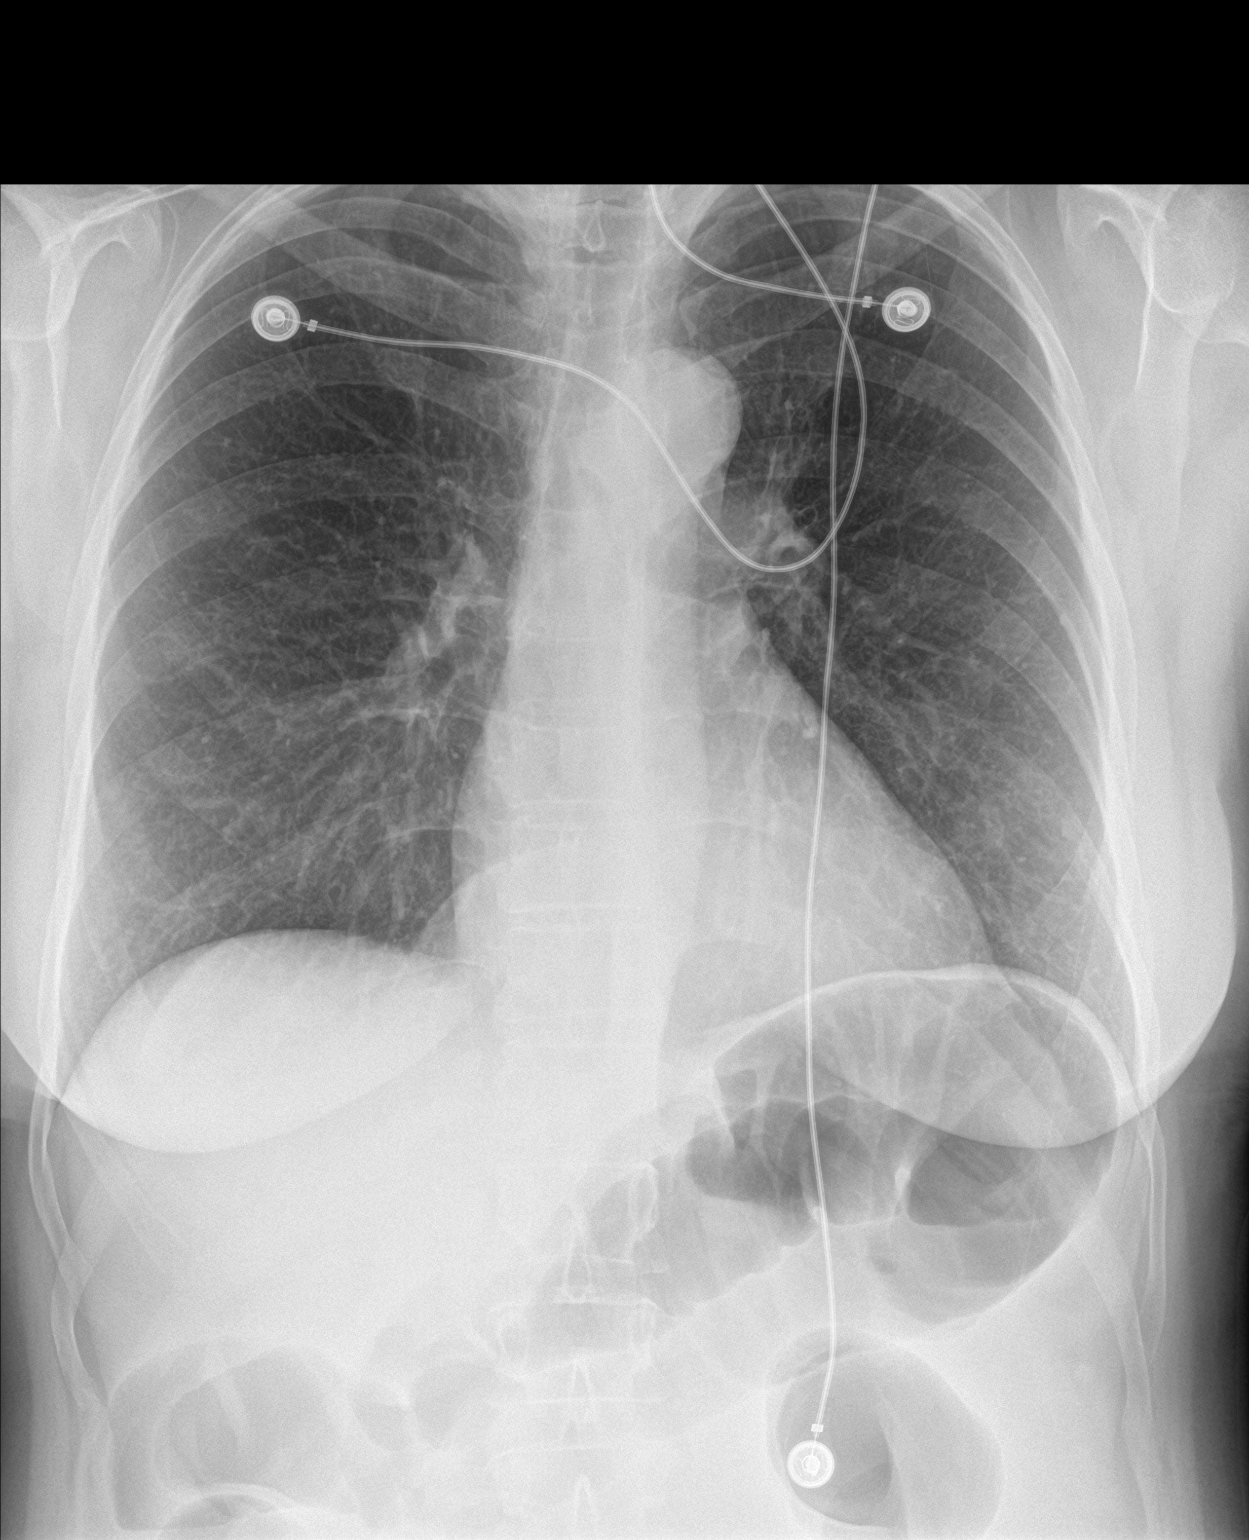

[abdomen erect]
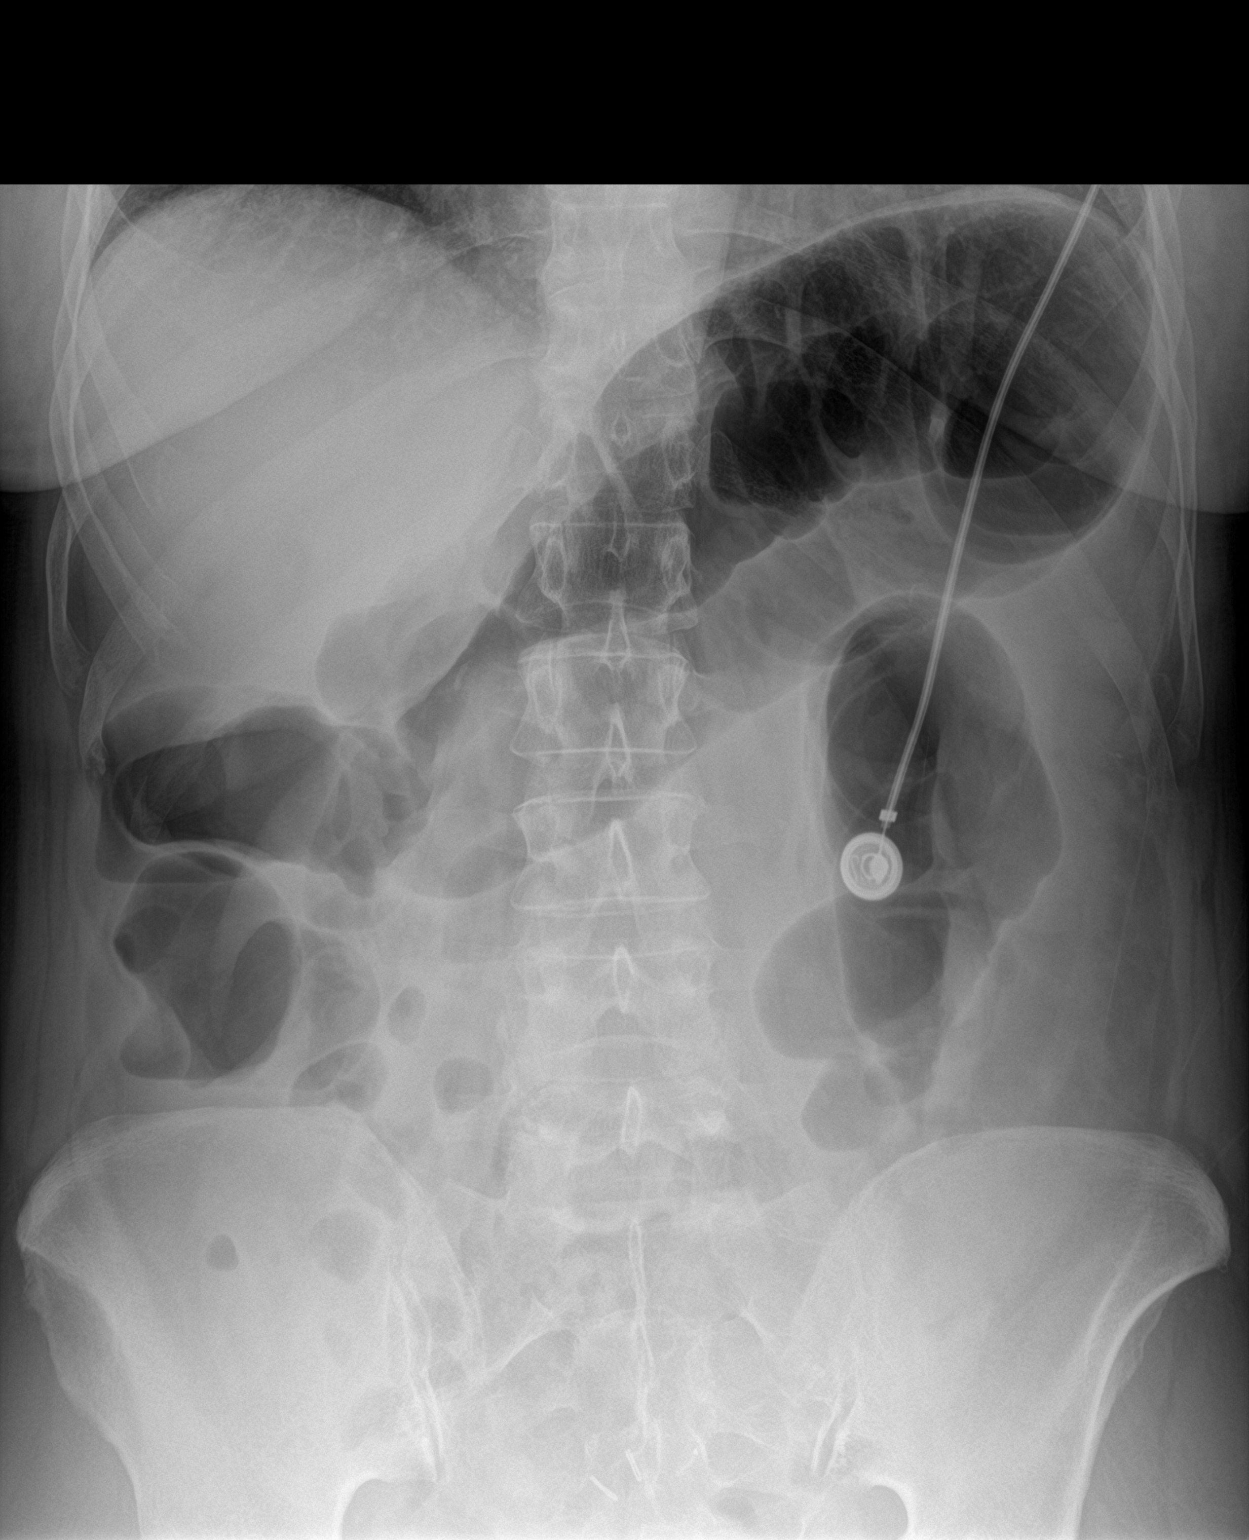

[abdomen supine]
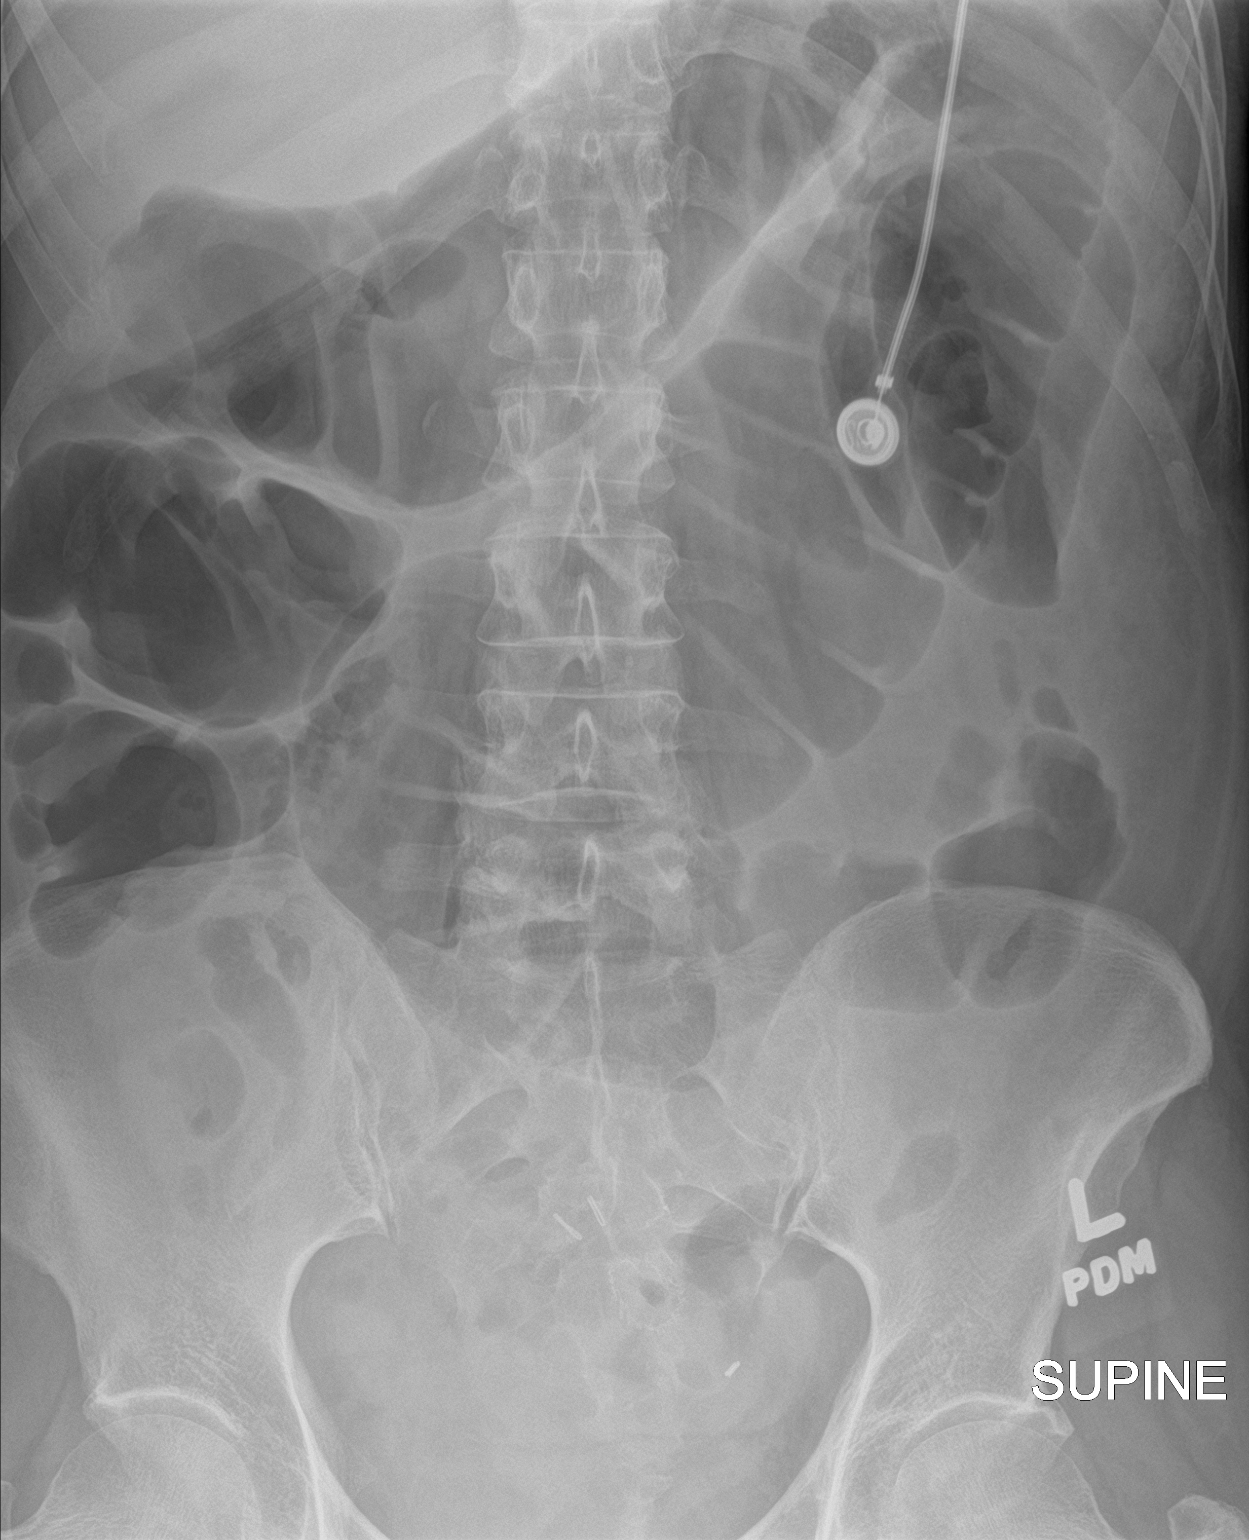

[3 of 3 positions shown; findings below may reference images not displayed]

FINDINGS: The colon is gas distended, largest loops of descending colon
measuring up to 10.3 cm. No free air in the abdomen. No radiopaque
calculi or other significant radiographic abnormality is seen. Heart
size and mediastinal contours are within normal limits. Both lungs
are clear.
IMPRESSION: 1. The colon is gas distended, largest loops of descending colon
measuring up to 10.3 cm. This presumably reflects insufflation
following colonoscopy, or alternately ileus.

2.  No free air in the abdomen.

3.  No acute abnormality of the lungs.

## 2021-01-07 MED ORDER — SODIUM CHLORIDE 0.9 % IV SOLN: 500.0000 mL | Freq: Once | INTRAVENOUS | Status: DC

## 2021-01-07 NOTE — Progress Notes (Signed)
A and O x3. Report to RN. Tolerated MAC anesthesia well.

## 2021-01-07 NOTE — Patient Instructions (Addendum)
I found and removed one very tiny polyp that looks benign. I will let you know pathology results and when to have another routine colonoscopy by mail and/or My Chart.  I appreciate the opportunity to care for you. Gatha Mayer, MD, [facility]  Polyp handout given to patient.  Resume previous diet. Continue present medications. Repeat colonoscopy recommended for surveillance.  Date to be determined after pathology results reviewed.   YOU HAD AN ENDOSCOPIC PROCEDURE TODAY AT [facility]:   Refer to the procedure report that was given to you for any specific questions about what was found during the examination.  If the procedure report does not answer your questions, please call your gastroenterologist to clarify.  If you requested that your care partner not be given the details of your procedure findings, then the procedure report has been included in a sealed envelope for you to review at your convenience later.  YOU SHOULD EXPECT: Some feelings of bloating in the abdomen. Passage of more gas than usual.  Walking can help get rid of the air that was put into your GI tract during the procedure and reduce the bloating. If you had a lower endoscopy (such as a colonoscopy or flexible sigmoidoscopy) you may notice spotting of blood in your stool or on the toilet paper. If you underwent a bowel prep for your procedure, you may not have a normal bowel movement for a few days.  Please Note:  You might notice some irritation and congestion in your nose or some drainage.  This is from the oxygen used during your procedure.  There is no need for concern and it should clear up in a day or so.  SYMPTOMS TO REPORT IMMEDIATELY:   Following lower endoscopy (colonoscopy or flexible sigmoidoscopy):  Excessive amounts of blood in the stool  Significant tenderness or worsening of abdominal pains  Swelling of the abdomen that is new, acute  Fever of 100F or higher  For urgent or emergent  issues, a gastroenterologist can be reached at any hour by calling [phone]. Do not use MyChart messaging for urgent concerns.    DIET:  We do recommend a small meal at first, but then you may proceed to your regular diet.  Drink plenty of fluids but you should avoid alcoholic beverages for 24 hours.  ACTIVITY:  You should plan to take it easy for the rest of today and you should NOT DRIVE or use heavy machinery until tomorrow (because of the sedation medicines used during the test).    FOLLOW UP: Our staff will call the number listed on your records 48-72 hours following your procedure to check on you and address any questions or concerns that you may have regarding the information given to you following your procedure. If we do not reach you, we will leave a message.  We will attempt to reach you two times.  During this call, we will ask if you have developed any symptoms of COVID 19. If you develop any symptoms (ie: fever, flu-like symptoms, shortness of breath, cough etc.) before then, please call [phone].  If you test positive for Covid 19 in the 2 weeks post procedure, please call and report this information to [place].    If any biopsies were taken you will be contacted by phone or by letter within the next 1-3 weeks.  Please call us at [phone] if you have not heard about the biopsies in 3 weeks.    SIGNATURES/CONFIDENTIALITY: You and/or  your care partner have signed paperwork which will be entered into your electronic medical record.  These signatures attest to the fact that that the information above on your After Visit Summary has been reviewed and is understood.  Full responsibility of the confidentiality of this discharge information lies with you and/or your care-partner.

## 2021-01-07 NOTE — Progress Notes (Signed)
Pt. Walked to restroom, discomfort greatly increased.  Pt. Felt she could not stand up.  With 2 RN's, pt. Escorted back to stretcher.  Dr. Carlean Purl notified.  Pt. Sent for x-ray.   Pt. Reports pain in left should persists at aa level 3-4.  Periumbilical pain present.  Skin warm and dry.  Pt. Not SOB, pt. Most comfortable laying on her side (either side).  Pt. Not pale or diaphoretic.

## 2021-01-07 NOTE — Progress Notes (Signed)
Pt's states no medical or surgical changes since previsit or office visit. 

## 2021-01-07 NOTE — Progress Notes (Signed)
Pt.'s BP over 179 systolic.  Pt. Has not hx of htn.  Pt. Has some abdominal cramping.   Dr. Carlean Purl advised and coming to check on pt.  Pt. Given Levsin, expelling more air as pt. Repositioned, on hands and knees.  Discomfort persists at a level 3-4 in left shoulder.

## 2021-01-07 NOTE — Progress Notes (Signed)
Pt. Returned to RR from x-ray.  Reports peri-abdominal tenderness is diminished.  Left shoulder pain has diminished.  Pt. Has not passed more air.   Generalized abdominal discomfort.  BP 174/74.  Skin warm and dry.

## 2021-01-07 NOTE — Op Note (Signed)
House Patient Name: Heather Hudson Procedure Date: 01/07/2021 11:00 AM MRN: [id number] Endoscopist: Gatha Mayer , MD Age: 65 Referring MD:  Date of Birth: July 01, 1956 Gender: Female Account #: [id number] Procedure:                Colonoscopy Indications:              Surveillance: Personal history of adenomatous                            polyps on last colonoscopy > 5 years ago Medicines:                Propofol per Anesthesia, Monitored Anesthesia Care Procedure:                Pre-Anesthesia Assessment:                           - Prior to the procedure, a History and Physical                            was performed, and patient medications and                            allergies were reviewed. The patient's tolerance of                            previous anesthesia was also reviewed. The risks                            and benefits of the procedure and the sedation                            options and risks were discussed with the patient.                            All questions were answered, and informed consent                            was obtained. Prior Anticoagulants: The patient has                            taken no previous anticoagulant or antiplatelet                            agents. ASA Grade Assessment: II - A patient with                            mild systemic disease. After reviewing the risks                            and benefits, the patient was deemed in                            satisfactory condition to undergo the procedure.  After obtaining informed consent, the colonoscope                            was passed under direct vision. Throughout the                            procedure, the patient's blood pressure, pulse, and                            oxygen saturations were monitored continuously. The                            Olympus CF-HQ190 [id number]) Colonoscope was                             introduced through the anus and advanced to the the                            cecum, identified by appendiceal orifice and                            ileocecal valve. The colonoscopy was somewhat                            difficult due to significant looping. Successful                            completion of the procedure was aided by applying                            abdominal pressure. The patient tolerated the                            procedure well. The quality of the bowel                            preparation was excellent. The ileocecal valve,                            appendiceal orifice, and rectum were photographed.                            The bowel preparation used was Miralax and MoviPrep                            via split dose instruction. Scope In: 11:04:09 AM Scope Out: 11:21:12 AM Scope Withdrawal Time: 0 hours 12 minutes 8 seconds  Total Procedure Duration: 0 hours 17 minutes 3 seconds  Findings:                 The perianal and digital rectal examinations were                            normal.  A 1 to 2 mm polyp was found in the splenic flexure.                            The polyp was sessile. The polyp was removed with a                            cold biopsy forceps. Resection and retrieval were                            complete. Verification of patient identification                            for the specimen was done. Estimated blood loss was                            minimal.                           There was evidence of a prior end-to-end                            colo-colonic anastomosis in the recto-sigmoid                            colon. This was patent and was characterized by                            healthy appearing mucosa.                           The exam was otherwise without abnormality on                            direct and retroflexion views. Complications:            No immediate  complications. Estimated Blood Loss:     Estimated blood loss was minimal. Impression:               - One 1 to 2 mm polyp at the splenic flexure,                            removed with a cold biopsy forceps. Resected and                            retrieved.                           - Patent end-to-end colo-colonic anastomosis,                            characterized by healthy appearing mucosa.                           - The examination was otherwise normal on direct  and retroflexion views.                           - Personal history of colonic polyp small adenoma                            (< 1cm) 2015. Recommendation:           - Patient has a contact number available for                            emergencies. The signs and symptoms of potential                            delayed complications were discussed with the                            patient. Return to normal activities tomorrow.                            Written discharge instructions were provided to the                            patient.                           - Resume previous diet.                           - Continue present medications.                           - Repeat colonoscopy is recommended for                            surveillance. The colonoscopy date will be                            determined after pathology results from today's                            exam become available for review. Gatha Mayer, MD 01/07/2021 11:28:25 AM This report has been signed electronically.

## 2021-01-07 NOTE — Progress Notes (Signed)
Patient initially looks fine in recovery but had persistent elevation of blood pressure and abdominal pain and was not passing much gas.  Hyoscyamine was administered she passed more gas but still had a fair amount of upper abdominal pain no back pain and some focal left lower quadrant tenderness.  Bowel sounds were present.  I had her do an acute abdominal series and I see air retention but no free air and she is much better not tender she says she feels normal and we will discharge her to home.

## 2021-01-07 NOTE — Progress Notes (Signed)
C.W. vital signs. 

## 2021-01-07 NOTE — Progress Notes (Signed)
Pt. Reports pain "basically gone."

## 2021-01-07 NOTE — Progress Notes (Signed)
Called to room to assist during endoscopic procedure.  Patient ID and intended procedure confirmed with present staff. Received instructions for my participation in the procedure from the performing physician.  

## 2021-01-09 ENCOUNTER — Telehealth: Payer: Self-pay

## 2021-01-09 NOTE — Telephone Encounter (Signed)
LVM

## 2021-01-19 ENCOUNTER — Encounter: Payer: Self-pay | Admitting: Internal Medicine

## 2021-01-19 DIAGNOSIS — Z860101 Personal history of adenomatous and serrated colon polyps: Secondary | ICD-10-CM | POA: Insufficient documentation

## 2021-01-19 DIAGNOSIS — Z86010 Personal history of colonic polyps: Secondary | ICD-10-CM

## 2021-01-19 HISTORY — DX: Personal history of colonic polyps: Z86.010

## 2021-01-19 HISTORY — DX: Personal history of adenomatous and serrated colon polyps: Z86.0101

## 2021-02-14 ENCOUNTER — Ambulatory Visit: Payer: 59 | Admitting: Primary Care

## 2021-02-14 ENCOUNTER — Other Ambulatory Visit: Payer: Self-pay

## 2021-02-14 ENCOUNTER — Encounter: Payer: Self-pay | Admitting: Primary Care

## 2021-02-14 VITALS — BP 150/88 | HR 76 | Temp 98.6°F | Ht 67.0 in | Wt 187.0 lb

## 2021-02-14 DIAGNOSIS — E785 Hyperlipidemia, unspecified: Secondary | ICD-10-CM | POA: Diagnosis not present

## 2021-02-14 DIAGNOSIS — R030 Elevated blood-pressure reading, without diagnosis of hypertension: Secondary | ICD-10-CM | POA: Insufficient documentation

## 2021-02-14 LAB — LIPID PANEL
Cholesterol: 227 mg/dL — ABNORMAL HIGH (ref 0–200)
HDL: 49.1 mg/dL (ref >39.00)
LDL Cholesterol: 147 mg/dL — ABNORMAL HIGH (ref 0–99)
NonHDL: 178.15
Total CHOL/HDL Ratio: 5
Triglycerides: 156 mg/dL — ABNORMAL HIGH (ref 0.0–149.0)
VLDL: 31.2 mg/dL (ref 0.0–40.0)

## 2021-02-14 LAB — TSH: TSH: 1.66 u[IU]/mL (ref 0.35–4.50)

## 2021-02-14 LAB — COMPREHENSIVE METABOLIC PANEL
ALT: 36 U/L — ABNORMAL HIGH (ref 0–35)
AST: 25 U/L (ref 0–37)
Albumin: 4.4 g/dL (ref 3.5–5.2)
Alkaline Phosphatase: 84 U/L (ref 39–117)
BUN: 14 mg/dL (ref 6–23)
CO2: 27 mEq/L (ref 19–32)
Calcium: 9.9 mg/dL (ref 8.4–10.5)
Chloride: 107 mEq/L (ref 96–112)
Creatinine, Ser: 0.69 mg/dL (ref 0.40–1.20)
GFR: 92.2 mL/min (ref >60.00)
Glucose, Bld: 102 mg/dL — ABNORMAL HIGH (ref 70–99)
Potassium: 4.2 mEq/L (ref 3.5–5.1)
Sodium: 140 mEq/L (ref 135–145)
Total Bilirubin: 0.8 mg/dL (ref 0.2–1.2)
Total Protein: 6.9 g/dL (ref 6.0–8.3)

## 2021-02-14 LAB — CBC
HCT: 41.3 % (ref 36.0–46.0)
Hemoglobin: 14.6 g/dL (ref 12.0–15.0)
MCHC: 35.4 g/dL (ref 30.0–36.0)
MCV: 89.5 fl (ref 78.0–100.0)
Platelets: 160 10*3/uL (ref 150.0–400.0)
RBC: 4.61 Mil/uL (ref 3.87–5.11)
RDW: 13.5 % (ref 11.5–15.5)
WBC: 4.6 10*3/uL (ref 4.0–10.5)

## 2021-02-14 NOTE — Progress Notes (Signed)
Subjective:    Patient ID: Heather Hudson, female    DOB: 11-06-1955, 65 y.o.   MRN: [id number]  HPI  Heather Hudson is a very pleasant 65 y.o. female with a history of hyperlipidemia who presents today to discuss elevated blood pressure.  Following with GI for ongoing left sided abdominal pain for which is suspected to be secondary from scar tissue from a prior fistula repair. She underwent colonoscopy and after completion her BP was in the 200's/100's, eventually reduced down to 161/94.   She's not checked her BP at home. She believes that her father had hypertension. She had Covid-19 infection in September 2021, otherwise no other changes. She has been walking the stairs at work for months, 3rd floor, has noticed increased shortness of breath, this has been since Covid-19 infection. She has noticed some intermittent ankle swelling.   She denies changes in her diet, increased salt intake.   BP Readings from Last 3 Encounters:  02/14/21 (!) 150/88  01/07/21 (!) 161/94  11/15/20 126/84        Review of Systems  Eyes: Negative for visual disturbance.  Respiratory:       See HPI  Cardiovascular: Negative for chest pain.  Neurological: Negative for dizziness and headaches.         Past Medical History:  Diagnosis Date  . Adenomatous colon polyp 2015  . Colovesical fistula 2015  . COVID-19 06/2020  . Environmental and seasonal allergies   . Hx of adenomatous colonic polyps 01/19/2021  . Migraines   . Motion sickness     Social History   Socioeconomic History  . Marital status: Married    Spouse name: Not on file  . Number of children: Not on file  . Years of education: Not on file  . Highest education level: Not on file  Occupational History  . Not on file  Tobacco Use  . Smoking status: Never Smoker  . Smokeless tobacco: Never Used  Vaping Use  . Vaping Use: Never used  Substance and Sexual Activity  . Alcohol use: Yes    Alcohol/week: 4.0 - 5.0 standard  drinks    Types: 4 - 5 Glasses of wine per week  . Drug use: No  . Sexual activity: Not Currently  Other Topics Concern  . Not on file  Social History Narrative   Married.  No children.   Moved from [place] in 2018    Worked in [employer], Mudlogger.  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   Enjoys wine tasting.    1-2 caffeinated drinks a day 0-1 alcoholic never smoker no drug use   Sister Di Kindle works Financial controller endoscopy as Therapist, sports   Social Determinants of Radio broadcast assistant Strain: Not on [employer] Insecurity: Not on file  Transportation Needs: Not on file  Physical Activity: Not on file  Stress: Not on file  Social Connections: Not on file  Intimate Partner Violence: Not on file    Past Surgical History:  Procedure Laterality Date  . COLONOSCOPY  2015   Plus others  . LAPAROSCOPIC LEFT COLON RESECTION  2015   Colovesical fistula, [place]  . WISDOM TOOTH EXTRACTION      Family History  Problem Relation Age of Onset  . Heart disease Mother   . Diabetes Mother   . Heart disease Father   . Prostate cancer Father   .  Malignant hyperthermia Sister   . Diabetes Brother   . Pancreatic cancer Maternal Aunt   . Colon cancer Neg Hx   . Esophageal cancer Neg Hx   . Stomach cancer Neg Hx   . Liver disease Neg Hx     Allergies  Allergen Reactions  . Aspirin Hives  . Ibuprofen Hives  . Other     Anesthesia (familial) SISTER HAD MALIGNANT HYPERTHERMIA  . Propofol Other (See Comments)    No current outpatient medications on file prior to visit.   Current Facility-Administered Medications on File Prior to Visit  Medication Dose Route Frequency Provider Last Rate Last Admin  . 0.9 %  sodium chloride infusion  500 mL Intravenous Once Gatha Mayer, MD        BP (!) 150/88   Pulse 76   Temp 98.6 F (37 C) (Temporal)   Ht 5\' 7"  (1.702 m)   Wt 187 lb  (84.8 kg)   SpO2 98%   BMI 29.29 kg/m  Objective:   Physical Exam Cardiovascular:     Rate and Rhythm: Normal rate and regular rhythm.  Pulmonary:     Effort: Pulmonary effort is normal.     Breath sounds: Normal breath sounds.  Musculoskeletal:     Cervical back: Neck supple.  Skin:    General: Skin is warm and dry.           Assessment & Plan:      This visit occurred during the SARS-CoV-2 public health emergency.  Safety protocols were in place, including screening questions prior to the visit, additional usage of staff PPE, and extensive cleaning of exam room while observing appropriate contact time as indicated for disinfecting solutions.

## 2021-02-14 NOTE — Patient Instructions (Signed)
Stop by the lab prior to leaving today. I will notify you of your results once received.   Start monitoring your blood pressure daily, around the same time of day, for the next 2 weeks.  Ensure that you have rested for 30 minutes prior to checking your blood pressure. Record your readings and send them to me via My Chart in 2 weeks.  It was a pleasure to see you today!   [email or web address]">  DASH Eating Plan DASH stands for Dietary Approaches to Stop Hypertension. The DASH eating plan is a healthy eating plan that has been shown to:  Reduce high blood pressure (hypertension).  Reduce your risk for type 2 diabetes, heart disease, and stroke.  Help with weight loss. What are tips for following this plan? Reading food labels  Check food labels for the amount of salt (sodium) per serving. Choose foods with less than 5 percent of the Daily Value of sodium. Generally, foods with less than 300 milligrams (mg) of sodium per serving fit into this eating plan.  To find whole grains, look for the word "whole" as the first word in the ingredient list. Shopping  Buy products labeled as "low-sodium" or "no salt added."  Buy fresh foods. Avoid canned foods and pre-made or frozen meals. Cooking  Avoid adding salt when cooking. Use salt-free seasonings or herbs instead of table salt or sea salt. Check with your health care provider or pharmacist before using salt substitutes.  Do not fry foods. Cook foods using healthy methods such as baking, boiling, grilling, roasting, and broiling instead.  Cook with heart-healthy oils, such as olive, canola, avocado, soybean, or sunflower oil. Meal planning  Eat a balanced diet that includes: ? 4 or more servings of fruits and 4 or more servings of vegetables each day. Try to fill one-half of your plate with fruits and vegetables. ? 6-8 servings of whole grains each day. ? Less than 6 oz (170 g) of lean meat,  poultry, or fish each day. A 3-oz (85-g) serving of meat is about the same size as a deck of cards. One egg equals 1 oz (28 g). ? 2-3 servings of low-fat dairy each day. One serving is 1 cup (237 mL). ? 1 serving of nuts, seeds, or beans 5 times each week. ? 2-3 servings of heart-healthy fats. Healthy fats called omega-3 fatty acids are found in foods such as walnuts, flaxseeds, fortified milks, and eggs. These fats are also found in cold-water fish, such as sardines, salmon, and mackerel.  Limit how much you eat of: ? Canned or prepackaged foods. ? Food that is high in trans fat, such as some fried foods. ? Food that is high in saturated fat, such as fatty meat. ? Desserts and other sweets, sugary drinks, and other foods with added sugar. ? Full-fat dairy products.  Do not salt foods before eating.  Do not eat more than 4 egg yolks a week.  Try to eat at least 2 vegetarian meals a week.  Eat more home-cooked food and less restaurant, buffet, and fast food.   Lifestyle  When eating at a restaurant, ask that your food be prepared with less salt or no salt, if possible.  If you drink alcohol: ? Limit how much you use to:  0-1 drink a day for women who are not pregnant.  0-2 drinks a day for men. ? Be aware of how much alcohol is in your drink. In the [place] one drink equals one 12 oz bottle  of beer (355 mL), one 5 oz glass of wine (148 mL), or one 1 oz glass of hard liquor (44 mL). General information  Avoid eating more than 2,300 mg of salt a day. If you have hypertension, you may need to reduce your sodium intake to 1,500 mg a day.  Work with your health care provider to maintain a healthy body weight or to lose weight. Ask what an ideal weight is for you.  Get at least 30 minutes of exercise that causes your heart to beat faster (aerobic exercise) most days of the week. Activities may include walking, swimming, or biking.  Work with your health care provider or dietitian to  adjust your eating plan to your individual calorie needs. What foods should I eat? Fruits All fresh, dried, or frozen fruit. Canned fruit in natural juice (without added sugar). Vegetables Fresh or frozen vegetables (raw, steamed, roasted, or grilled). Low-sodium or reduced-sodium tomato and vegetable juice. Low-sodium or reduced-sodium tomato sauce and tomato paste. Low-sodium or reduced-sodium canned vegetables. Grains Whole-grain or whole-wheat bread. Whole-grain or whole-wheat pasta. Brown rice. [place]row. Bulgur. Whole-grain and low-sodium cereals. Pita bread. Low-fat, low-sodium crackers. Whole-wheat flour tortillas. Meats and other proteins Skinless chicken or [place]. Ground chicken or [place]. Pork with fat trimmed off. Fish and seafood. Egg whites. Dried beans, peas, or lentils. Unsalted nuts, nut butters, and seeds. Unsalted canned beans. Lean cuts of beef with fat trimmed off. Low-sodium, lean precooked or cured meat, such as sausages or meat loaves. Dairy Low-fat (1%) or fat-free (skim) milk. Reduced-fat, low-fat, or fat-free cheeses. Nonfat, low-sodium ricotta or cottage cheese. Low-fat or nonfat yogurt. Low-fat, low-sodium cheese. Fats and oils Soft margarine without trans fats. Vegetable oil. Reduced-fat, low-fat, or light mayonnaise and salad dressings (reduced-sodium). Canola, safflower, olive, avocado, soybean, and sunflower oils. Avocado. Seasonings and condiments Herbs. Spices. Seasoning mixes without salt. Other foods Unsalted popcorn and pretzels. Fat-free sweets. The items listed above may not be a complete list of foods and beverages you can eat. Contact a dietitian for more information. What foods should I avoid? Fruits Canned fruit in a light or heavy syrup. Fried fruit. Fruit in cream or butter sauce. Vegetables Creamed or fried vegetables. Vegetables in a cheese sauce. Regular canned vegetables (not low-sodium or reduced-sodium). Regular canned tomato sauce and  paste (not low-sodium or reduced-sodium). Regular tomato and vegetable juice (not low-sodium or reduced-sodium). Angie Fava. Olives. Grains Baked goods made with fat, such as croissants, muffins, or some breads. Dry pasta or rice meal packs. Meats and other proteins Fatty cuts of meat. Ribs. Fried meat. Berniece Salines. Bologna, salami, and other precooked or cured meats, such as sausages or meat loaves. Fat from the back of a pig (fatback). Bratwurst. Salted nuts and seeds. Canned beans with added salt. Canned or smoked fish. Whole eggs or egg yolks. Chicken or [place] with skin. Dairy Whole or 2% milk, cream, and half-and-half. Whole or full-fat cream cheese. Whole-fat or sweetened yogurt. Full-fat cheese. Nondairy creamers. Whipped toppings. Processed cheese and cheese spreads. Fats and oils Butter. Stick margarine. Lard. Shortening. Ghee. Bacon fat. Tropical oils, such as coconut, palm kernel, or palm oil. Seasonings and condiments Onion salt, garlic salt, seasoned salt, table salt, and sea salt. Worcestershire sauce. Tartar sauce. Barbecue sauce. Teriyaki sauce. Soy sauce, including reduced-sodium. Steak sauce. Canned and packaged gravies. Fish sauce. Oyster sauce. Cocktail sauce. Store-bought horseradish. Ketchup. Mustard. Meat flavorings and tenderizers. Bouillon cubes. Hot sauces. Pre-made or packaged marinades. Pre-made or packaged taco seasonings. Relishes. Regular salad  dressings. Other foods Salted popcorn and pretzels. The items listed above may not be a complete list of foods and beverages you should avoid. Contact a dietitian for more information. Where to find more information  National Heart, Lung, and Blood Institute: https://wilson-eaton.com/  American Heart Association: www.heart.org  Academy of Nutrition and Dietetics: www.eatright.[place]: www.kidney.org Summary  The DASH eating plan is a healthy eating plan that has been shown to reduce high blood pressure  (hypertension). It may also reduce your risk for type 2 diabetes, heart disease, and stroke.  When on the DASH eating plan, aim to eat more fresh fruits and vegetables, whole grains, lean proteins, low-fat dairy, and heart-healthy fats.  With the DASH eating plan, you should limit salt (sodium) intake to 2,300 mg a day. If you have hypertension, you may need to reduce your sodium intake to 1,500 mg a day.  Work with your health care provider or dietitian to adjust your eating plan to your individual calorie needs. This information is not intended to replace advice given to you by your health care provider. Make sure you discuss any questions you have with your health care provider. Document Revised: 08/26/2019 Document Reviewed: 08/26/2019 Elsevier Patient Education  2021 [employer].

## 2021-02-14 NOTE — Assessment & Plan Note (Signed)
Above goal today, also one month ago after colonoscopy. Family history in her father of HTN.  Discussed to work on diet, weight loss, etc. Recommended treatment today, she kindly declines and would like to work on lifestyle changes.  I recommended she start checking BP at home, report readings via My Chart in 2 weeks.   Checking labs today.

## 2021-02-14 NOTE — Assessment & Plan Note (Signed)
Discussed the importance of a healthy diet and regular exercise in order for weight loss, and to reduce the risk of any potential medical problems.  Repeat lipid panel pending.

## 2021-09-28 ENCOUNTER — Telehealth: Payer: 59 | Admitting: Nurse Practitioner

## 2021-09-28 DIAGNOSIS — J069 Acute upper respiratory infection, unspecified: Secondary | ICD-10-CM | POA: Diagnosis not present

## 2021-09-28 NOTE — Progress Notes (Signed)
Virtual Visit Consent   Heather Hudson, you are scheduled for a virtual visit with a [facility] provider today.     Just as with appointments in the office, your consent must be obtained to participate.  Your consent will be active for this visit and any virtual visit you may have with one of our providers in the next 365 days.     If you have a MyChart account, a copy of this consent can be sent to you electronically.  All virtual visits are billed to your insurance company just like a traditional visit in the office.    As this is a virtual visit, video technology does not allow for your provider to perform a traditional examination.  This may limit your provider's ability to fully assess your condition.  If your provider identifies any concerns that need to be evaluated in person or the need to arrange testing (such as labs, EKG, etc.), we will make arrangements to do so.     Although advances in technology are sophisticated, we cannot ensure that it will always work on either your end or our end.  If the connection with a video visit is poor, the visit may have to be switched to a telephone visit.  With either a video or telephone visit, we are not always able to ensure that we have a secure connection.     I need to obtain your verbal consent now.   Are you willing to proceed with your visit today?    Heather Hudson has provided verbal consent on 09/28/2021 for a virtual visit (video or telephone).   Apolonio Schneiders, FNP   Date: 09/28/2021 1:21 PM   Virtual Visit via Video Note   I, Apolonio Schneiders, connected with  Heather Hudson  ([id number], [birth year]) on 09/28/21 at  1:30 PM EST by a video-enabled telemedicine application and verified that I am speaking with the correct person using two identifiers.  Location: Patient: Virtual Visit Location Patient: Home Provider: Virtual Visit Location Provider: Home Office   I discussed the limitations of evaluation and management by telemedicine and  the availability of in person appointments. The patient expressed understanding and agreed to proceed.    History of Present Illness: Heather Hudson is a 65 y.o. who identifies as a female who was assigned female at birth, and is being seen today with complaints of nasal congestion and fatigue that onset in the past 24 hours.   She has had a cough for the past 2 months  She had the flu at the beginning of November and her cough has persisted since that time.   Her cough has been dry  She feels warm today without a known fever.    Problems:  Patient Active Problem List   Diagnosis Date Noted   Elevated blood pressure reading in office without diagnosis of hypertension 02/14/2021   Hx of adenomatous colonic polyps 01/19/2021   Lower abdominal pain 12/23/2019   Preventative health care 09/07/2018   Hyperlipidemia 09/07/2018   Environmental and seasonal allergies 01/26/2018   Motion sickness 01/26/2018    Allergies:  Allergies  Allergen Reactions   Aspirin Hives   Ibuprofen Hives   Other     Anesthesia (familial) SISTER HAD MALIGNANT HYPERTHERMIA   Propofol Other (See Comments)   Medications: No current outpatient medications on file.  Current Facility-Administered Medications:    0.9 %  sodium chloride infusion, 500 mL, Intravenous, Once, Gatha Mayer, MD  Observations/Objective: Patient is well-developed, well-nourished  in no acute distress.  Resting comfortably t home.  Head is normocephalic, atraumatic.  No labored breathing.  Speech is clear and coherent with logical content.  Patient is alert and oriented at baseline.    Assessment and Plan: 1. Viral upper respiratory tract infection Advised OTC regimen for new onset URI symptoms.  Immune support with Vitamin C  Advised home COVID test and follow up in person for assessment of ongoing cough      Follow Up Instructions: I discussed the assessment and treatment plan with the patient. The patient was provided  an opportunity to ask questions and all were answered. The patient agreed with the plan and demonstrated an understanding of the instructions.  A copy of instructions were sent to the patient via MyChart unless otherwise noted below.     The patient was advised to call back or seek an in-person evaluation if the symptoms worsen or if the condition fails to improve as anticipated.  Time:  I spent 10 minutes with the patient via telehealth technology discussing the above problems/concerns.    Apolonio Schneiders, FNP

## 2021-10-06 HISTORY — PX: OTHER SURGICAL HISTORY: SHX169

## 2021-10-10 ENCOUNTER — Other Ambulatory Visit: Payer: Self-pay

## 2021-10-10 ENCOUNTER — Ambulatory Visit (INDEPENDENT_AMBULATORY_CARE_PROVIDER_SITE_OTHER): Payer: 59 | Admitting: Primary Care

## 2021-10-10 ENCOUNTER — Encounter: Payer: Self-pay | Admitting: Primary Care

## 2021-10-10 DIAGNOSIS — R053 Chronic cough: Secondary | ICD-10-CM

## 2021-10-10 DIAGNOSIS — R21 Rash and other nonspecific skin eruption: Secondary | ICD-10-CM | POA: Diagnosis not present

## 2021-10-10 DIAGNOSIS — R051 Acute cough: Secondary | ICD-10-CM | POA: Diagnosis not present

## 2021-10-10 HISTORY — DX: Chronic cough: R05.3

## 2021-10-10 MED ORDER — TRIAMCINOLONE ACETONIDE 0.5 % EX OINT: 1.0000 "application " | TOPICAL_OINTMENT | Freq: Two times a day (BID) | CUTANEOUS | 0 refills | Status: DC

## 2021-10-10 MED ORDER — AZITHROMYCIN 250 MG PO TABS: ORAL_TABLET | ORAL | 0 refills | Status: DC

## 2021-10-10 NOTE — Assessment & Plan Note (Signed)
Acute and chronic, appears to be eczema related.   Rx for triamcinolone 0.5% ointment sent to pharmacy. She will update.

## 2021-10-10 NOTE — Progress Notes (Signed)
Subjective:    Patient ID: Heather Hudson, female    DOB: 08-Feb-1956, 66 y.o.   MRN: [id number]  HPI  Heather Hudson is a very pleasant 66 y.o. female with a history of seasonal allergies, hyperlipidemia, elevated blood pressure who presents today to discuss cough, rash.   1) Cough: Today she endorses symptoms began 08/04/21 with cough, congestion which lasted for about 2 weeks. Tested negative for Covid, but husband tested positive.  Around mid November her cough changed into a dry cough, overall felt better.   She went to [facility] for Christmas, symptoms increased for three days around Christmas with cough, chest congestion, post nasal drip, headaches, felt warm. Evaluated via E-visit on 09/28/21 for a 2 month history of cough that has persisted since diagnosis of influenza in November 2022. Cough was described as dry. Also with nasal congestion and fatigue. She was diagnosed with viral URI and advised to take a home Covid-19 test.  She returned home on 10/01/21. Since then she continues to have cough with chest congestion, yellow/thick sputum. She's been taking OTC cough and cold but nothing regularly. Cough is worse during the day, not so much during the night.   She is a non smoker and has no history of asthma. She's noticed more dyspnea since September 2021 when she had Covid-19.   2) Rash: Chronic rash to left dorsal hand at first metacarpal joint mostly, sometimes to right dorsal hand. She first noticed this rash around November 2022. She does notice intermittent itching, will dry up, waxes and wanes.   Also with rash to left lateral trunk for the last 6 months, intermittent itching. About the same.   She's applied hydrocortisone OTC to her hand with temporary improvement.   Review of Systems  Constitutional:  Positive for fatigue. Negative for chills and fever.  HENT:  Positive for congestion and postnasal drip. Negative for sore throat.   Respiratory:  Positive for cough. Negative  for shortness of breath.         Past Medical History:  Diagnosis Date   Adenomatous colon polyp 2015   Colovesical fistula 2015   COVID-19 06/2020   Environmental and seasonal allergies    Hx of adenomatous colonic polyps 01/19/2021   Migraines    Motion sickness     Social History   Socioeconomic History   Marital status: Married    Spouse name: Not on file   Number of children: Not on file   Years of education: Not on file   Highest education level: Not on file  Occupational History   Not on file  Tobacco Use   Smoking status: Never   Smokeless tobacco: Never  Vaping Use   Vaping Use: Never used  Substance and Sexual Activity   Alcohol use: Yes    Alcohol/week: 4.0 - 5.0 standard drinks    Types: 4 - 5 Glasses of wine per week   Drug use: No   Sexual activity: Not Currently  Other Topics Concern   Not on file  Social History Narrative   Married.  No children.   Moved from [place] in 2018    Worked in [employer], Mud[employer].  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   Enjoys wine tasting.    1-2 caffeinated drinks a day 0-1 alcoholic never smoker no drug use   Sister Di Kindle works Financial controller endoscopy as Therapist, sports  Social Determinants of Health   Financial Resource Strain: Not on file  Food Insecurity: Not on file  Transportation Needs: Not on file  Physical Activity: Not on file  Stress: Not on file  Social Connections: Not on file  Intimate Partner Violence: Not on file    Past Surgical History:  Procedure Laterality Date   COLONOSCOPY  2015   Plus others   LAPAROSCOPIC LEFT COLON RESECTION  2015   Colovesical fistula, [place]   WISDOM TOOTH EXTRACTION      Family History  Problem Relation Age of Onset   Heart disease Mother    Diabetes Mother    Heart disease Father    Prostate cancer Father    Malignant hyperthermia Sister    Diabetes Brother     Pancreatic cancer Maternal Aunt    Colon cancer Neg Hx    Esophageal cancer Neg Hx    Stomach cancer Neg Hx    Liver disease Neg Hx     Allergies  Allergen Reactions   Aspirin Hives   Ibuprofen Hives   Other     Anesthesia (familial) SISTER HAD MALIGNANT HYPERTHERMIA   Propofol Other (See Comments)    No current outpatient medications on file prior to visit.   Current Facility-Administered Medications on File Prior to Visit  Medication Dose Route Frequency Provider Last Rate Last Admin   0.9 %  sodium chloride infusion  500 mL Intravenous Once Gatha Mayer, MD        BP 128/68    Pulse 74    Temp 97.8 F (36.6 C) (Temporal)    Ht 5\' 7"  (1.702 m)    Wt 186 lb (84.4 kg)    SpO2 98%    BMI 29.13 kg/m  Objective:   Physical Exam Constitutional:      Appearance: She is not ill-appearing.  HENT:     Right Ear: Tympanic membrane and ear canal normal.     Left Ear: Tympanic membrane and ear canal normal.     Nose:     Right Sinus: No maxillary sinus tenderness or frontal sinus tenderness.     Left Sinus: No maxillary sinus tenderness or frontal sinus tenderness.     Mouth/Throat:     Pharynx: No posterior oropharyngeal erythema.  Eyes:     Conjunctiva/sclera: Conjunctivae normal.  Cardiovascular:     Rate and Rhythm: Normal rate and regular rhythm.  Pulmonary:     Effort: Pulmonary effort is normal.     Breath sounds: Normal breath sounds. No wheezing or rales.     Comments: Congested cough noted during exam Musculoskeletal:     Cervical back: Neck supple.  Lymphadenopathy:     Cervical: No cervical adenopathy.  Skin:    General: Skin is warm and dry.     Findings: Rash present.     Comments: 2 cm circular, scaly, light red rash to left dorsal hand at 1st MCP joint. No rash to right hand.  Two small areas of darker rash to left lateral trunk with scaling.          Assessment & Plan:      This visit occurred during the SARS-CoV-2 public health  emergency.  Safety protocols were in place, including screening questions prior to the visit, additional usage of staff PPE, and extensive cleaning of exam room while observing appropriate contact time as indicated for disinfecting solutions.

## 2021-10-10 NOTE — Assessment & Plan Note (Signed)
Sounds like she initially had a viral URI, suspious for bacterial involvement now given deep, congested, cough today and duration of symptoms.  Doesn't appear to be GERD solely, but GERD could be part of the equation.   Rx for Azithromycin 250 mg tablets sent to pharmacy.  She will update if no improvement, at that time proceed with chest xray.

## 2021-10-10 NOTE — Patient Instructions (Signed)
Start Azithromycin antibiotics for infection. Take 2 tablets by mouth today, then 1 tablet daily for 4 additional days.  You may apply the triamcinolone ointment twice daily for about 1-2 weeks.  It was a pleasure to see you today!

## 2021-11-14 ENCOUNTER — Ambulatory Visit: Payer: 59 | Admitting: Podiatry

## 2021-11-20 ENCOUNTER — Encounter: Payer: Self-pay | Admitting: Podiatry

## 2021-11-20 ENCOUNTER — Ambulatory Visit (INDEPENDENT_AMBULATORY_CARE_PROVIDER_SITE_OTHER): Payer: 59

## 2021-11-20 ENCOUNTER — Other Ambulatory Visit: Payer: Self-pay

## 2021-11-20 ENCOUNTER — Ambulatory Visit (INDEPENDENT_AMBULATORY_CARE_PROVIDER_SITE_OTHER): Payer: 59 | Admitting: Podiatry

## 2021-11-20 DIAGNOSIS — M7672 Peroneal tendinitis, left leg: Secondary | ICD-10-CM

## 2021-11-20 NOTE — Progress Notes (Signed)
°  Subjective:  Patient ID: Heather Hudson, female    DOB: 14-Sep-1956,  MRN: [id number]  Chief Complaint  Patient presents with   Foot Pain    Left foot pain on the outside of her foot for about 6 months     66 y.o. female presents with the above complaint.  Patient presents with complaint of left lateral foot pain.  Patient states for over 6 months pain comes and goes has progressive gotten worse.  She states that she notices more when ambulating.  She has not seen MRIs prior to seeing me.  She denies any other acute complaints.  Pain scale is 6 out of 10 dull achy in nature.  Hurts with ambulation.   Review of Systems: Negative except as noted in the HPI. Denies N/V/F/Ch.  Past Medical History:  Diagnosis Date   Adenomatous colon polyp 2015   Colovesical fistula 2015   COVID-19 06/2020   Environmental and seasonal allergies    Hx of adenomatous colonic polyps 01/19/2021   Migraines    Motion sickness     Current Outpatient Medications:    azithromycin (ZITHROMAX) 250 MG tablet, Take 2 tablets by mouth today, then 1 tablet daily for 4 additional days., Disp: 6 tablet, Rfl: 0   triamcinolone ointment (KENALOG) 0.5 %, Apply 1 application topically 2 (two) times daily., Disp: 30 g, Rfl: 0  Current Facility-Administered Medications:    0.9 %  sodium chloride infusion, 500 mL, Intravenous, Once, Gatha Mayer, MD  Social History   Tobacco Use  Smoking Status Never  Smokeless Tobacco Never    Allergies  Allergen Reactions   Aspirin Hives   Ibuprofen Hives   Other     Anesthesia (familial) SISTER HAD MALIGNANT HYPERTHERMIA   Propofol Other (See Comments)   Objective:  There were no vitals filed for this visit. There is no height or weight on file to calculate BMI. Constitutional Well developed. Well nourished.  Vascular Dorsalis pedis pulses palpable bilaterally. Posterior tibial pulses palpable bilaterally. Capillary refill normal to all digits.  No cyanosis or  clubbing noted. Pedal hair growth normal.  Neurologic Normal speech. Oriented to person, place, and time. Epicritic sensation to light touch grossly present bilaterally.  Dermatologic Nails well groomed and normal in appearance. No open wounds. No skin lesions.  Orthopedic: Pain on palpation left lateral foot.  Pain at the insertion of the peroneal tendon.  Pain with resisted dorsiflexion eversion of the foot.  Some pain along the course of the metatarsal base as well.  No extensor or flexor tendinitis clinically appreciated   Radiographs: 3 views of skeletally mature adult left foot: No stress fracture noted no bony abnormalities noted.  Mild midfoot arthritis noted.  Hallux limitus/arthritic changes noted to the first metatarsophalangeal joint Assessment:   1. Peroneal tendinitis, left    Plan:  Patient was evaluated and treated and all questions answered.  Left peroneal tendinitis -All questions and concerns were discussed with the patient in extensive detail -Clinically given the amount of pain that she is experiencing I believe she will benefit from cam boot immobilization to allow the soft tissue inflammation to decrease.  I discussed with the patient she states understanding -Cam boot was dispensed for immobilization -If there is no improvement we will discuss steroid injection versus MRI at her next clinical visit   No follow-ups on file.

## 2021-12-16 ENCOUNTER — Ambulatory Visit (INDEPENDENT_AMBULATORY_CARE_PROVIDER_SITE_OTHER): Payer: 59 | Admitting: Podiatry

## 2021-12-16 ENCOUNTER — Encounter: Payer: Self-pay | Admitting: Podiatry

## 2021-12-16 ENCOUNTER — Other Ambulatory Visit: Payer: Self-pay

## 2021-12-16 DIAGNOSIS — M7672 Peroneal tendinitis, left leg: Secondary | ICD-10-CM | POA: Diagnosis not present

## 2021-12-16 DIAGNOSIS — M778 Other enthesopathies, not elsewhere classified: Secondary | ICD-10-CM | POA: Diagnosis not present

## 2021-12-16 DIAGNOSIS — S86312A Strain of muscle(s) and tendon(s) of peroneal muscle group at lower leg level, left leg, initial encounter: Secondary | ICD-10-CM | POA: Diagnosis not present

## 2021-12-16 MED ORDER — METHYLPREDNISOLONE 4 MG PO TBPK: ORAL_TABLET | ORAL | 0 refills | Status: DC

## 2021-12-16 NOTE — Progress Notes (Signed)
She presents today as a patient of Dr. Serita Grit for follow-up of her peroneal tendinitis left.  She states that the foot is actually hurting worse than it was before she states that the boot is not comfortable at all and causing more pain.  She states that the pain is less over here as she points to the peroneal tendons more right and here she points to the fourth fifth tarsometatarsal joint at this time. ? ?Objective: Vital signs are stable she is alert and oriented x3 pulses are strong and palpable left no calf pain.  She has good range of motion of the ankle joint and subtalar joint.  She has tenderness on frontal plane range of motion particularly on inversion at the fourth fifth tarsometatarsal joint.  She has some tenderness on palpation of the peroneal tendons which do appear to be fluctuant on palpation.  But they are both intact but there is some swelling over the area.  She has no pain on palpation of the plantar fascial Caney insertion site medially. ? ?Assessment: Probable split tear of the peroneal tendons and fourth fifth tarsometatarsal capsulitis.  Does not appear to be a lateral compensatory syndrome. ? ?Plan: Discussed etiology pathology conservative surgical therapies at this point I am requesting MRI due to failure of conservative therapies to alleviate her symptoms and also putting her on methylprednisolone.  She is allergic to nonsteroidals.  Dr. Posey Pronto will follow-up with her once her MRI comes in. ?

## 2021-12-18 ENCOUNTER — Ambulatory Visit: Payer: 59 | Admitting: Podiatry

## 2021-12-31 ENCOUNTER — Encounter: Payer: Self-pay | Admitting: Podiatry

## 2021-12-31 NOTE — Progress Notes (Unsigned)
MRI approved - #[id number] - good for 180 days ?

## 2022-01-01 ENCOUNTER — Other Ambulatory Visit: Payer: Self-pay

## 2022-01-01 ENCOUNTER — Ambulatory Visit
Admission: RE | Admit: 2022-01-01 | Discharge: 2022-01-01 | Disposition: A | Discharge status: Home / Self Care | Payer: 59 | Source: Ambulatory Visit | Attending: Podiatry | Admitting: Podiatry

## 2022-01-01 DIAGNOSIS — M778 Other enthesopathies, not elsewhere classified: Secondary | ICD-10-CM

## 2022-01-01 DIAGNOSIS — S86312A Strain of muscle(s) and tendon(s) of peroneal muscle group at lower leg level, left leg, initial encounter: Secondary | ICD-10-CM

## 2022-01-01 IMAGING — MR MR ANKLE*L* W/O CM
5 series · 37 of 40 positions shown · non-contrast
Comparison: Left foot radiographs [DATE]

CLINICAL DATA: Left ankle pain. Clinical concern for tendon
abnormality. Evaluate for peroneal tendon tear capsulitis fourth and
fifth tarsometatarsal joint. Surgical consideration.

EXAM:
MRI OF THE LEFT ANKLE WITHOUT CONTRAST
TECHNIQUE: Multiplanar, multisequence MR imaging of the ankle was performed. No
intravenous contrast was administered.

[Series 4: T2 fat-sat · axial · 3.0mm · 0.56mm/px · z∈[-117,+32]mm · 9 of 39 slices shown (1 of 2)]
[im 1/39]
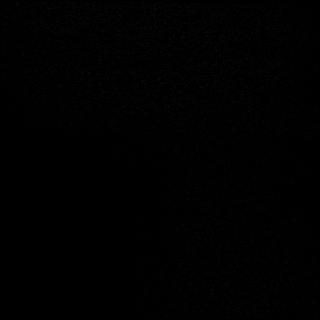
[im 5/39]
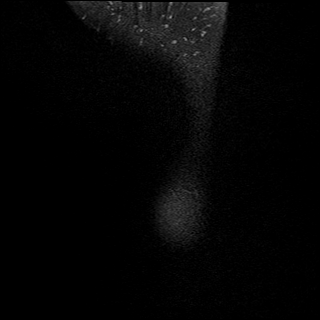
[im 10/39]
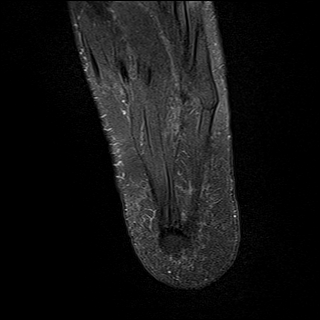
[im 15/39]
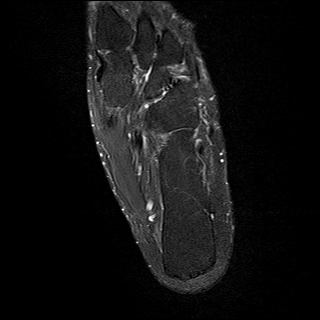
[im 20/39]
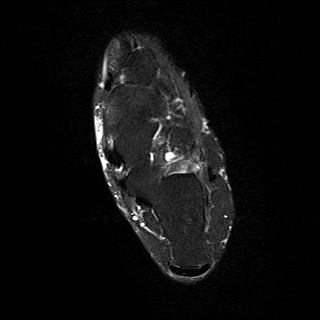
[im 24/39]
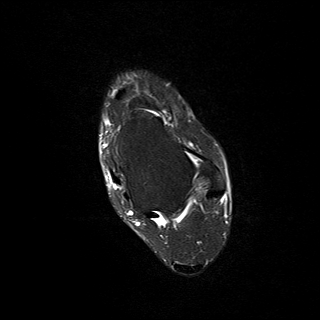
[im 29/39]
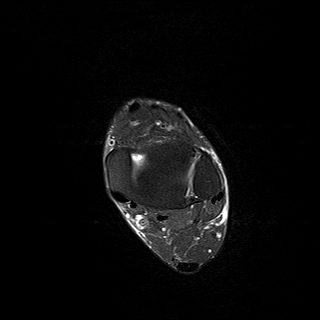
[im 34/39]
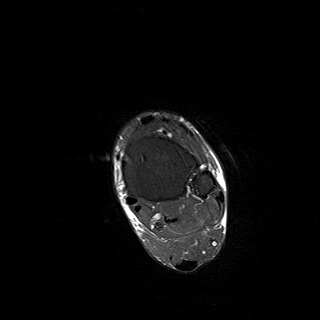
[im 39/39]
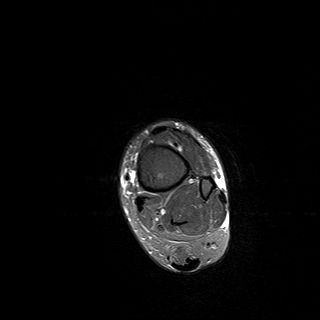

[Series 5: PD fat-sat · axial · 3.0mm · 0.56mm/px · z∈[-118,+30]mm · 9 of 39 slices shown]
[im 1/39]
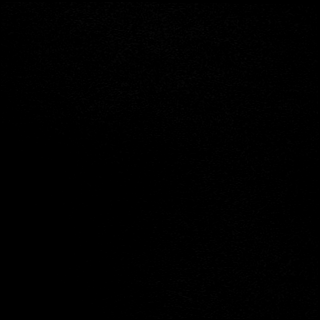
[im 5/39]
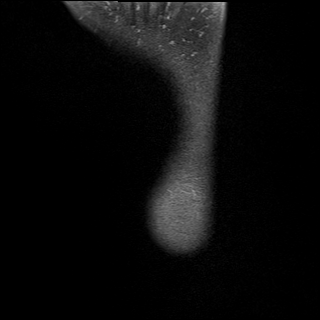
[im 10/39]
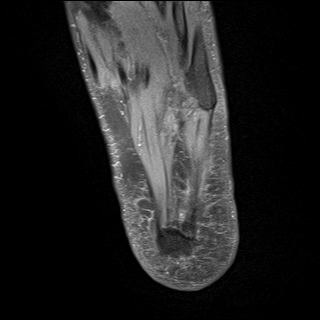
[im 15/39]
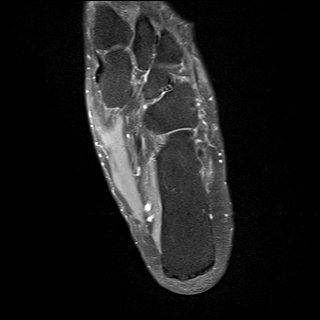
[im 20/39]
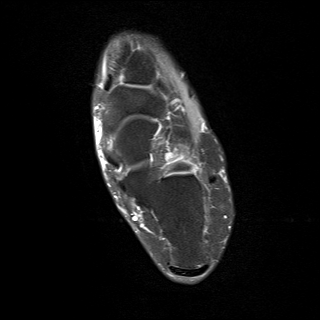
[im 24/39]
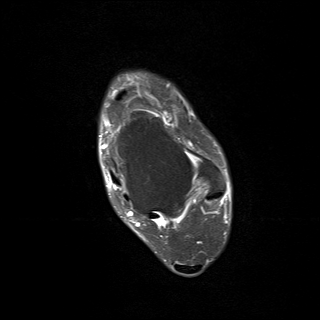
[im 29/39]
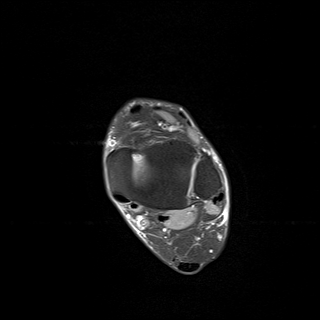
[im 34/39]
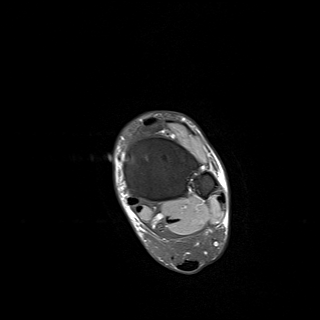
[im 39/39]
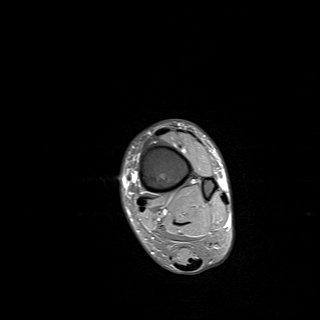

[Series 6: T1 · sagittal · 4.0mm · 0.56mm/px · 6 of 24 slices shown]
[im 1/24]
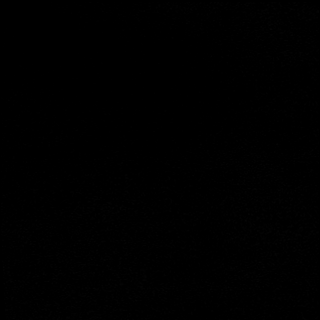
[im 5/24]
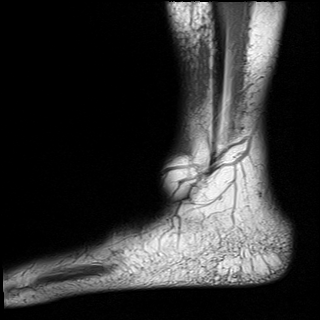
[im 10/24]
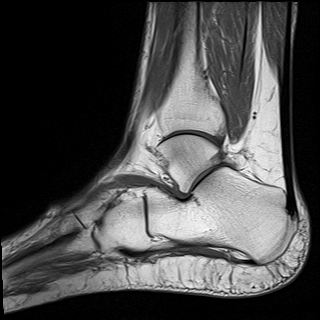
[im 14/24]
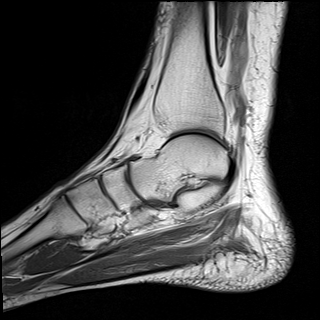
[im 19/24]
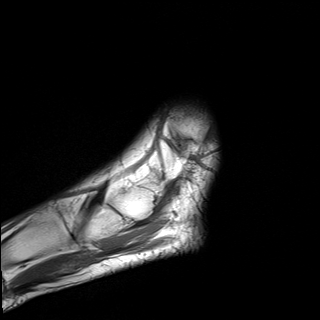
[im 24/24]
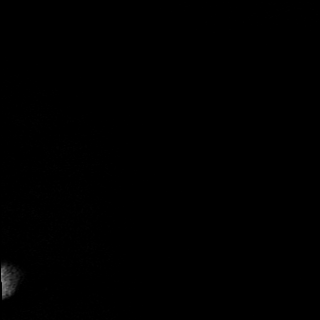

[Series 7: STIR · sagittal · 4.0mm · 0.35mm/px · 5 of 24 slices shown]
[im 1/24]
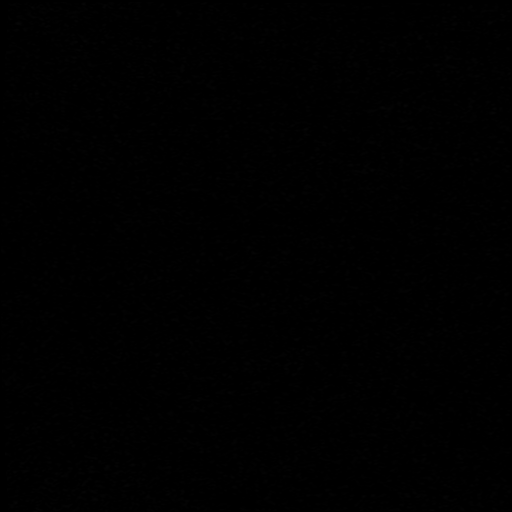
[im 5/24]
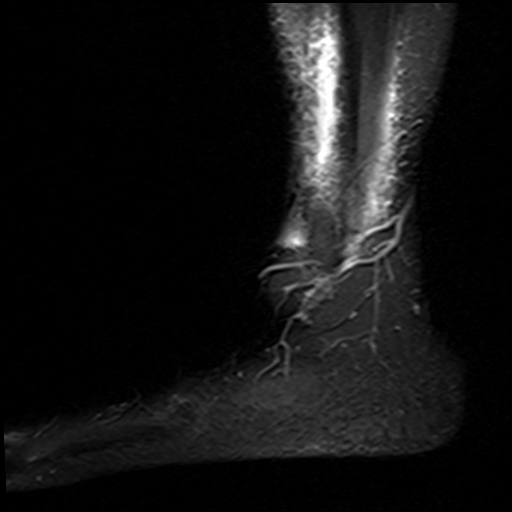
[im 10/24]
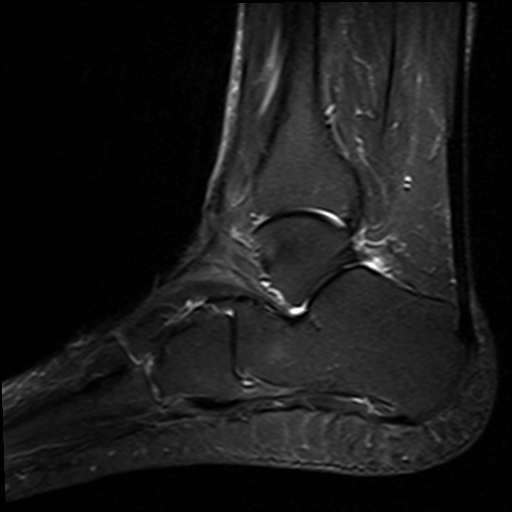
[im 14/24]
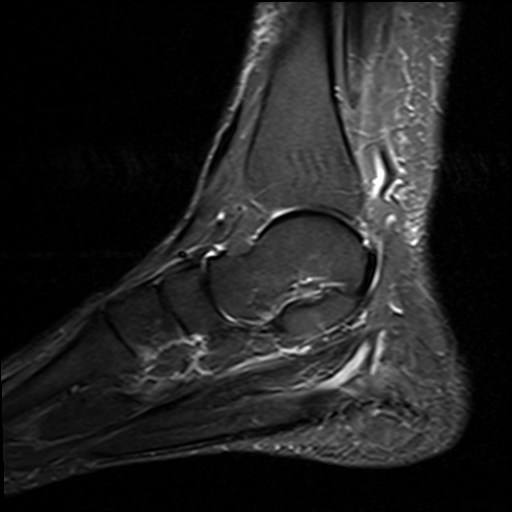
[im 19/24]
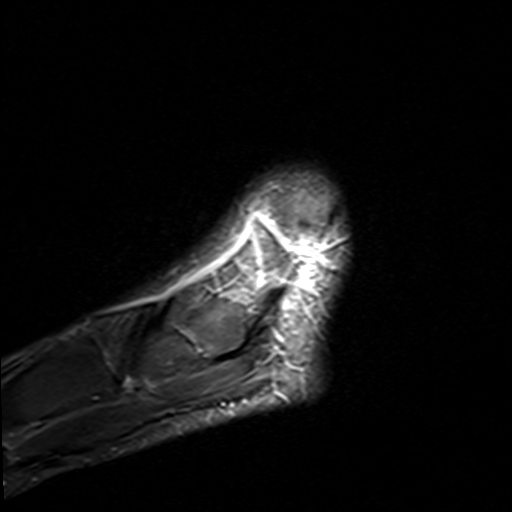

[Series 8: T2 fat-sat · coronal · 3.0mm · 0.51mm/px · 8 of 44 slices shown (2 of 2)]
[im 1/44]
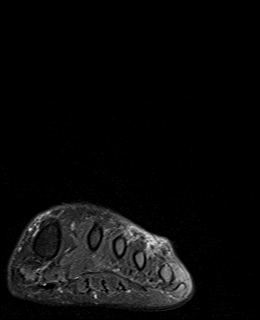
[im 5/44]
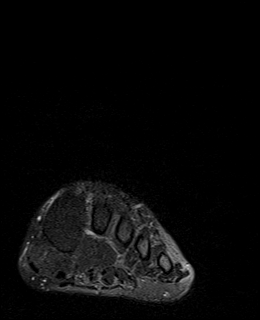
[im 15/44]
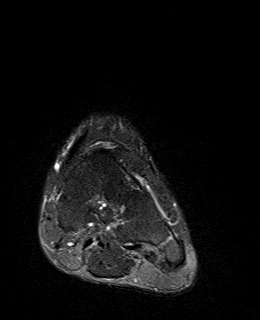
[im 20/44]
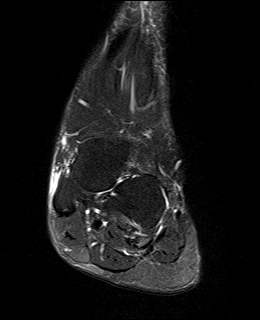
[im 24/44]
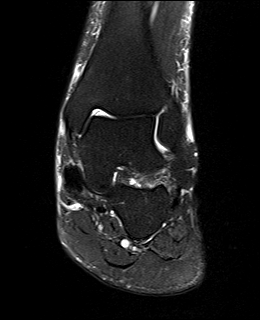
[im 29/44]
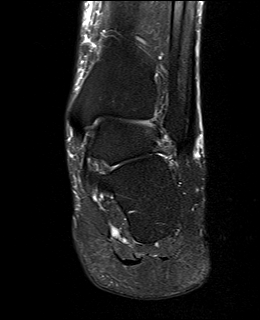
[im 39/44]
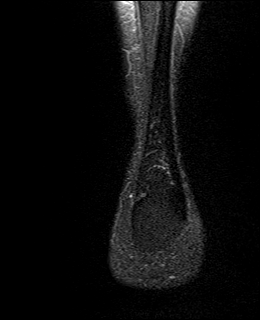
[im 44/44]
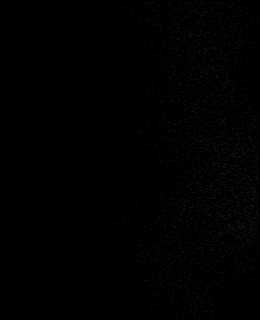

[37 of 40 positions shown; findings below may reference images not displayed]

FINDINGS: TENDONS

Peroneal: There is subtle linear intermediate T2 signal within the
midsubstance of the peroneus brevis tendon starting at the distal
aspect of the fibula (axial image 17) and involving an approximate 1
cm length of the tendon (axial images 17 through 19). The peroneus
longus tendon is intact.

Posteromedial: Minimal intermediate T2 signal posterior tibial
insertional tendinosis. The flexor digitorum longus tendon is
intact. Mild tenosynovitis of the flexor hallucis longus tendon at
the level of the posterior talus.

Anterior: The tibialis anterior, extensor hallucis longus and
extensor digitorum longus tendons are intact.

Achilles: Intact.

Plantar Fascia: Intact.

LIGAMENTS

Lateral: The anterior and posterior talofibular, anterior and
posterior tibiofibular, and calcaneofibular ligaments are intact.

Medial: The tibiotalar deep deltoid and tibial spring ligaments are
intact.

CARTILAGE

Ankle Joint: Intact cartilage.

Subtalar Joints/Sinus Tarsi: Fat is preserved within sinus tarsi.

Bones: Mild dorsal midfoot degenerative osteophytes.

Other: The Lisfranc ligament complex is intact. The tarsal tunnel is
unremarkable.

No joint effusion or capsular thickening is seen within the fourth
and fifth tarsometatarsal joints. There is mild-to-moderate thinning
of fourth tarsometatarsal cartilage.
IMPRESSION: :
IMPRESSION: 1. Very mild non fluid bright partial-thickness midsubstance tear of
the peroneus brevis tendon starting at the distal aspect of the
fibula.
2. No joint effusion or capsular thickening is seen within the
fourth and fifth tarsometatarsal joints. There is mild-to-moderate
thinning of fourth tarsometatarsal cartilage.

## 2022-01-07 ENCOUNTER — Telehealth: Payer: Self-pay | Admitting: *Deleted

## 2022-01-07 NOTE — Telephone Encounter (Signed)
Dr. Milinda Pointer had to do a peer to peer on this one (see info below) - what she received may be from the initial denial, if you want to give her this information. ? ?12/31/21 ?MRI approved - #[id number] - good for 180 days ?

## 2022-01-07 NOTE — Telephone Encounter (Signed)
Patient is calling because she has received a letter from her insurance declining the MRI study. Please resubmit for payment of this. ?

## 2022-01-07 NOTE — Telephone Encounter (Signed)
Notified patient and gave approval #,verbalized understanding.

## 2022-01-16 ENCOUNTER — Other Ambulatory Visit (HOSPITAL_COMMUNITY)
Admission: RE | Admit: 2022-01-16 | Discharge: 2022-01-16 | Disposition: A | Discharge status: Home / Self Care | Payer: 59 | Source: Ambulatory Visit | Attending: Primary Care | Admitting: Primary Care

## 2022-01-16 ENCOUNTER — Encounter: Payer: Self-pay | Admitting: Primary Care

## 2022-01-16 ENCOUNTER — Ambulatory Visit (INDEPENDENT_AMBULATORY_CARE_PROVIDER_SITE_OTHER): Payer: 59 | Admitting: Primary Care

## 2022-01-16 VITALS — BP 128/76 | HR 65 | Temp 98.0°F | Ht 67.0 in | Wt 189.0 lb

## 2022-01-16 DIAGNOSIS — J3089 Other allergic rhinitis: Secondary | ICD-10-CM

## 2022-01-16 DIAGNOSIS — Z124 Encounter for screening for malignant neoplasm of cervix: Secondary | ICD-10-CM | POA: Insufficient documentation

## 2022-01-16 DIAGNOSIS — E785 Hyperlipidemia, unspecified: Secondary | ICD-10-CM | POA: Diagnosis not present

## 2022-01-16 DIAGNOSIS — Z86010 Personal history of colonic polyps: Secondary | ICD-10-CM | POA: Diagnosis not present

## 2022-01-16 DIAGNOSIS — Z0000 Encounter for general adult medical examination without abnormal findings: Secondary | ICD-10-CM

## 2022-01-16 DIAGNOSIS — Z1231 Encounter for screening mammogram for malignant neoplasm of breast: Secondary | ICD-10-CM

## 2022-01-16 LAB — COMPREHENSIVE METABOLIC PANEL
ALT: 46 U/L — ABNORMAL HIGH (ref 0–35)
AST: 29 U/L (ref 0–37)
Albumin: 4.2 g/dL (ref 3.5–5.2)
Alkaline Phosphatase: 98 U/L (ref 39–117)
BUN: 14 mg/dL (ref 6–23)
CO2: 24 mEq/L (ref 19–32)
Calcium: 9.4 mg/dL (ref 8.4–10.5)
Chloride: 107 mEq/L (ref 96–112)
Creatinine, Ser: 0.69 mg/dL (ref 0.40–1.20)
GFR: 91.6 mL/min (ref >60.00)
Glucose, Bld: 103 mg/dL — ABNORMAL HIGH (ref 70–99)
Potassium: 3.9 mEq/L (ref 3.5–5.1)
Sodium: 138 mEq/L (ref 135–145)
Total Bilirubin: 0.6 mg/dL (ref 0.2–1.2)
Total Protein: 6.8 g/dL (ref 6.0–8.3)

## 2022-01-16 LAB — LIPID PANEL
Cholesterol: 203 mg/dL — ABNORMAL HIGH (ref 0–200)
HDL: 52.2 mg/dL (ref >39.00)
LDL Cholesterol: 129 mg/dL — ABNORMAL HIGH (ref 0–99)
NonHDL: 150.66
Total CHOL/HDL Ratio: 4
Triglycerides: 107 mg/dL (ref 0.0–149.0)
VLDL: 21.4 mg/dL (ref 0.0–40.0)

## 2022-01-16 LAB — HEMOGLOBIN A1C: Hgb A1c MFr Bld: 5.1 % (ref 4.6–6.5)

## 2022-01-16 NOTE — Patient Instructions (Signed)
Stop by the lab prior to leaving today. I will notify you of your results once received.  ° °[facility] to schedule your mammogram.  ° °It was a pleasure to see you today! ° °Preventive Care 40-64 Years Old, Female °Preventive care refers to lifestyle choices and visits with your health care provider that can promote health and wellness. Preventive care visits are also called wellness exams. °What can I expect for my preventive care visit? °Counseling °Your health care provider may ask you questions about your: °Medical history, including: °Past medical problems. °Family medical history. °Pregnancy history. °Current health, including: °Menstrual cycle. °Method of birth control. °Emotional well-being. °Home life and relationship well-being. °Sexual activity and sexual health. °Lifestyle, including: °Alcohol, nicotine or tobacco, and drug use. °Access to firearms. °Diet, exercise, and sleep habits. °Work and work environment. °Sunscreen use. °Safety issues such as seatbelt and bike helmet use. °Physical exam °Your health care provider will check your: °Height and weight. These may be used to calculate your BMI (body mass index). BMI is a measurement that tells if you are at a healthy weight. °Waist circumference. This measures the distance around your waistline. This measurement also tells if you are at a healthy weight and may help predict your risk of certain diseases, such as type 2 diabetes and high blood pressure. °Heart rate and blood pressure. °Body temperature. °Skin for abnormal spots. °What immunizations do I need? °Vaccines are usually given at various ages, according to a schedule. Your health care provider will recommend vaccines for you based on your age, medical history, and lifestyle or other factors, such as travel or where you work. °What tests do I need? °Screening °Your health care provider may recommend screening tests for certain conditions. This may include: °Lipid and cholesterol  levels. °Diabetes screening. This is done by checking your blood sugar (glucose) after you have not eaten for a while (fasting). °Pelvic exam and Pap test. °Hepatitis B test. °Hepatitis C test. °HIV (human immunodeficiency virus) test. °STI (sexually transmitted infection) testing, if you are at risk. °Lung cancer screening. °Colorectal cancer screening. °Mammogram. Talk with your health care provider about when you should start having regular mammograms. This may depend on whether you have a family history of breast cancer. °BRCA-related cancer screening. This may be done if you have a family history of breast, ovarian, tubal, or peritoneal cancers. °Bone density scan. This is done to screen for osteoporosis. °Talk with your health care provider about your test results, treatment options, and if necessary, the need for more tests. °Follow these instructions at home: °Eating and drinking ° °Eat a diet that includes fresh fruits and vegetables, whole grains, lean protein, and low-fat dairy products. °Take vitamin and mineral supplements as recommended by your health care provider. °Do not drink alcohol if: °Your health care provider tells you not to drink. °You are pregnant, may be pregnant, or are planning to become pregnant. °If you drink alcohol: °Limit how much you have to 0-1 drink a day. °Know how much alcohol is in your drink. In the [place] one drink equals one 12 oz bottle of beer (355 mL), one 5 oz glass of wine (148 mL), or one 1½ oz glass of hard liquor (44 mL). °Lifestyle °Brush your teeth every morning and night with fluoride toothpaste. Floss one time each day. °Exercise for at least 30 minutes 5 or more days each week. °Do not use any products that contain nicotine or tobacco. These products include cigarettes, chewing tobacco,   and vaping devices, such as e-cigarettes. If you need help quitting, ask your health care provider. °Do not use drugs. °If you are sexually active, practice safe sex. Use a  condom or other form of protection to prevent STIs. °If you do not wish to become pregnant, use a form of birth control. If you plan to become pregnant, see your health care provider for a prepregnancy visit. °Take aspirin only as told by your health care provider. Make sure that you understand how much to take and what form to take. Work with your health care provider to find out whether it is safe and beneficial for you to take aspirin daily. °Find healthy ways to manage stress, such as: °Meditation, yoga, or listening to music. °Journaling. °Talking to a trusted person. °Spending time with friends and family. °Minimize exposure to UV radiation to reduce your risk of skin cancer. °Safety °Always wear your seat belt while driving or riding in a vehicle. °Do not drive: °If you have been drinking alcohol. Do not ride with someone who has been drinking. °When you are tired or distracted. °While texting. °If you have been using any mind-altering substances or drugs. °Wear a helmet and other protective equipment during sports activities. °If you have firearms in your house, make sure you follow all gun safety procedures. °Seek help if you have been physically or sexually abused. °What's next? °Visit your health care provider once a year for an annual wellness visit. °Ask your health care provider how often you should have your eyes and teeth checked. °Stay up to date on all vaccines. °This information is not intended to replace advice given to you by your health care provider. Make sure you discuss any questions you have with your health care provider. °Document Revised: 03/20/2021 Document Reviewed: 03/20/2021 °Elsevier Patient Education © 2022 [employer] ° °

## 2022-01-16 NOTE — Assessment & Plan Note (Signed)
Stable.       - Continue to monitor

## 2022-01-16 NOTE — Assessment & Plan Note (Signed)
Discussed the importance of a healthy diet and regular exercise in order for weight loss, and to reduce the risk of further co-morbidity.  Repeat lipid panel pending. 

## 2022-01-16 NOTE — Assessment & Plan Note (Signed)
Declines Shingles vaccines. ?Pap smear due today. Completed. ?Mammogram due, order placed. ?Colonoscopy UTD, due 2025. ? ?Discussed the importance of a healthy diet and regular exercise in order for weight loss, and to reduce the risk of further co-morbidity. ? ?Exam today stable. ?Labs pending. ?

## 2022-01-16 NOTE — Assessment & Plan Note (Signed)
Colonoscopy UTD, due 2025 

## 2022-01-16 NOTE — Progress Notes (Signed)
? ?Subjective:  ? ? Patient ID: Heather Hudson, female    DOB: 05-24-1956, 66 y.o.   MRN: [id number] ? ?HPI ? ?Heather Hudson is a very pleasant 66 y.o. female who presents today for complete physical and follow up of chronic conditions. ? ?Immunizations: ?-Tetanus: 2015 ?-Influenza: Due next season  ?-Covid-19: Has not completed  ?-Shingles: Never completed, declines  ? ?Diet: Fair diet.  ?Exercise: No regular exercise. ? ?Eye exam: Completes annually  ?Dental exam: Completes semi-annually  ? ?Pap Smear: Completed in 2019, due today ?Mammogram: Completed in 2021 ?Colonoscopy: Completed in 2022, due 2025 ? ?BP Readings from Last 3 Encounters:  ?01/16/22 128/76  ?10/10/21 128/68  ?02/14/21 (!) 150/88  ? ? ? ? ? ? ? ?Review of Systems  ?Constitutional:  Negative for unexpected weight change.  ?HENT:  Negative for rhinorrhea.   ?Eyes:  Negative for visual disturbance.  ?Respiratory:  Negative for cough and shortness of breath.   ?Cardiovascular:  Negative for chest pain.  ?Gastrointestinal:  Negative for constipation and diarrhea.  ?Genitourinary:  Negative for difficulty urinating.  ?Musculoskeletal:  Positive for arthralgias. Negative for myalgias.  ?Skin:  Negative for rash.  ?Allergic/Immunologic: Negative for environmental allergies.  ?Neurological:  Negative for dizziness, numbness and headaches.  ?Psychiatric/Behavioral:  The patient is not nervous/anxious.   ? ?   ? ? ?Past Medical History:  ?Diagnosis Date  ? Adenomatous colon polyp 2015  ? Colovesical fistula 2015  ? COVID-19 06/2020  ? Environmental and seasonal allergies   ? Hx of adenomatous colonic polyps 01/19/2021  ? Migraines   ? Motion sickness   ? ? ?Social History  ? ?Socioeconomic History  ? Marital status: Married  ?  Spouse name: Not on file  ? Number of children: Not on file  ? Years of education: Not on file  ? Highest education level: Not on file  ?Occupational History  ? Not on file  ?Tobacco Use  ? Smoking status: Never  ? Smokeless tobacco:  Never  ?Vaping Use  ? Vaping Use: Never used  ?Substance and Sexual Activity  ? Alcohol use: Yes  ?  Alcohol/week: 4.0 - 5.0 standard drinks  ?  Types: 4 - 5 Glasses of wine per week  ? Drug use: No  ? Sexual activity: Not Currently  ?Other Topics Concern  ? Not on file  ?Social History Narrative  ? Married.  No children.  ? Moved from [place] in 2018   ? Worked in [employer], Mud[employer].  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  ? 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  ? Enjoys wine tasting.   ? 1-2 caffeinated drinks a day 0-1 alcoholic never smoker no drug use  ? Sister Di Kindle works Financial controller endoscopy as Therapist, sports  ? ?Social Determinants of Health  ? ?Financial Resource Strain: Not on file  ?Food Insecurity: Not on file  ?Transportation Needs: Not on file  ?Physical Activity: Not on file  ?Stress: Not on file  ?Social Connections: Not on file  ?Intimate Partner Violence: Not on file  ? ? ?Past Surgical History:  ?Procedure Laterality Date  ? COLONOSCOPY  2015  ? Plus others  ? LAPAROSCOPIC LEFT COLON RESECTION  2015  ? Colovesical fistula, [place]  ? WISDOM TOOTH EXTRACTION    ? ? ?Family History  ?Problem Relation Age of Onset  ? Heart disease Mother   ? Diabetes Mother   ? Heart disease Father   ?  Prostate cancer Father   ? Malignant hyperthermia Sister   ? Diabetes Brother   ? Pancreatic cancer Maternal Aunt   ? Colon cancer Neg Hx   ? Esophageal cancer Neg Hx   ? Stomach cancer Neg Hx   ? Liver disease Neg Hx   ? ? ?Allergies  ?Allergen Reactions  ? Aspirin Hives  ? Ibuprofen Hives  ? Other   ?  Anesthesia (familial) ?SISTER HAD MALIGNANT HYPERTHERMIA  ? Propofol Other (See Comments)  ? ? ?No current outpatient medications on file prior to visit.  ? ?Current Facility-Administered Medications on File Prior to Visit  ?Medication Dose Route Frequency Provider Last Rate Last Admin  ? 0.9 %  sodium chloride infusion  500 mL Intravenous Once Gatha Mayer,  MD      ? ? ?BP 128/76   Pulse 65   Temp 98 ?F (36.7 ?C) (Oral)   Ht '5\' 7"'$  (1.702 m)   Wt 189 lb (85.7 kg)   SpO2 98%   BMI 29.60 kg/m?  ?Objective:  ? Physical Exam ?HENT:  ?   Right Ear: Tympanic membrane and ear canal normal.  ?   Left Ear: Tympanic membrane and ear canal normal.  ?   Nose: Nose normal.  ?Eyes:  ?   Conjunctiva/sclera: Conjunctivae normal.  ?   Pupils: Pupils are equal, round, and reactive to light.  ?Neck:  ?   Thyroid: No thyromegaly.  ?Cardiovascular:  ?   Rate and Rhythm: Normal rate and regular rhythm.  ?   Heart sounds: No murmur heard. ?Pulmonary:  ?   Effort: Pulmonary effort is normal.  ?   Breath sounds: Normal breath sounds. No rales.  ?Abdominal:  ?   General: Bowel sounds are normal.  ?   Palpations: Abdomen is soft.  ?   Tenderness: There is no abdominal tenderness.  ?Genitourinary: ?   Labia:     ?   Right: No tenderness or lesion.     ?   Left: No tenderness or lesion.   ?   Vagina: No vaginal discharge or erythema.  ?   Cervix: No discharge or erythema.  ?   Uterus: Absent.   ?   Adnexa: Right adnexa normal and left adnexa normal.  ?Musculoskeletal:     ?   General: Normal range of motion.  ?   Cervical back: Neck supple.  ?Lymphadenopathy:  ?   Cervical: No cervical adenopathy.  ?Skin: ?   General: Skin is warm and dry.  ?   Findings: No rash.  ?Neurological:  ?   Mental Status: She is alert and oriented to person, place, and time.  ?   Cranial Nerves: No cranial nerve deficit.  ?   Deep Tendon Reflexes: Reflexes are normal and symmetric.  ?Psychiatric:     ?   Mood and Affect: Mood normal.  ? ? ? ? ? ?   ?Assessment & Plan:  ? ? ? ? ?This visit occurred during the SARS-CoV-2 public health emergency.  Safety protocols were in place, including screening questions prior to the visit, additional usage of staff PPE, and extensive cleaning of exam room while observing appropriate contact time as indicated for disinfecting solutions.  ?

## 2022-01-20 LAB — CYTOLOGY - PAP
Comment: NEGATIVE
Diagnosis: NEGATIVE
High risk HPV: NEGATIVE

## 2022-01-22 ENCOUNTER — Ambulatory Visit (INDEPENDENT_AMBULATORY_CARE_PROVIDER_SITE_OTHER): Payer: 59 | Admitting: Podiatry

## 2022-01-22 DIAGNOSIS — M7672 Peroneal tendinitis, left leg: Secondary | ICD-10-CM

## 2022-01-23 NOTE — Progress Notes (Signed)
?  Subjective:  ?Patient ID: Heather Hudson, female    DOB: 1956/06/23,  MRN: [id number] ? ?Chief Complaint  ?Patient presents with  ? Foot Pain  ?  MRI results   ? ? ?66 y.o. female presents with the above complaint.  Patient presents for follow-up to left plantar peroneal tendinitis.  She states that she is doing a lot better she does not have any pain.  She wanted to go over the MRI.  She denies any other acute complaints ? ? ?Review of Systems: Negative except as noted in the HPI. Denies N/V/F/Ch. ? ?Past Medical History:  ?Diagnosis Date  ? Adenomatous colon polyp 2015  ? Colovesical fistula 2015  ? COVID-19 06/2020  ? Environmental and seasonal allergies   ? Hx of adenomatous colonic polyps 01/19/2021  ? Migraines   ? Motion sickness   ? ?No current outpatient medications on file. ? ?Current Facility-Administered Medications:  ?  0.9 %  sodium chloride infusion, 500 mL, Intravenous, Once, Gatha Mayer, MD ? ?Social History  ? ?Tobacco Use  ?Smoking Status Never  ?Smokeless Tobacco Never  ? ? ?Allergies  ?Allergen Reactions  ? Aspirin Hives  ? Ibuprofen Hives  ? Other   ?  Anesthesia (familial) ?SISTER HAD MALIGNANT HYPERTHERMIA  ? Propofol Other (See Comments)  ? ?Objective:  ?There were no vitals filed for this visit. ?There is no height or weight on file to calculate BMI. ?Constitutional Well developed. ?Well nourished.  ?Vascular Dorsalis pedis pulses palpable bilaterally. ?Posterior tibial pulses palpable bilaterally. ?Capillary refill normal to all digits.  ?No cyanosis or clubbing noted. ?Pedal hair growth normal.  ?Neurologic Normal speech. ?Oriented to person, place, and time. ?Epicritic sensation to light touch grossly present bilaterally.  ?Dermatologic Nails well groomed and normal in appearance. ?No open wounds. ?No skin lesions.  ?Orthopedic: No further pain on palpation left lateral foot.  No further pain at the insertion of the peroneal tendon.  No further pain with resisted dorsiflexion eversion  of the foot.  Some pain along the course of the metatarsal base as well.  No extensor or flexor tendinitis clinically appreciated  ? ?Radiographs: 3 views of skeletally mature adult left foot: No stress fracture noted no bony abnormalities noted.  Mild midfoot arthritis noted.  Hallux limitus/arthritic changes noted to the first metatarsophalangeal joint ? ?IMPRESSION: ?1. Very mild non fluid bright partial-thickness midsubstance tear of ?the peroneus brevis tendon starting at the distal aspect of the ?fibula. ?2. No joint effusion or capsular thickening is seen within the ?fourth and fifth tarsometatarsal joints. There is mild-to-moderate ?thinning of fourth tarsometatarsal cartilage. ?  ?Assessment:  ? ?1. Peroneal tendinitis, left   ? ? ?Plan:  ?Patient was evaluated and treated and all questions answered. ? ?Left peroneal tendinitis ?-All questions and concerns were discussed with the patient in extensive detail ?-Clinically at this time her pain has completely resolved.  She had the MRI done which does show some tearing of the peroneal tendon which could have been the cause of her pain however clinically her pain has completely resolved at this time we will continue to clinically monitor this.  And if it regresses we can discuss surgical options at that time.  She states understanding and would like to proceed with conservative options for now. ? ?No follow-ups on file. ? ?

## 2022-02-10 ENCOUNTER — Ambulatory Visit
Admission: RE | Admit: 2022-02-10 | Discharge: 2022-02-10 | Disposition: A | Discharge status: Home / Self Care | Payer: 59 | Source: Ambulatory Visit | Attending: Primary Care | Admitting: Primary Care

## 2022-02-10 DIAGNOSIS — Z1231 Encounter for screening mammogram for malignant neoplasm of breast: Secondary | ICD-10-CM

## 2022-02-10 IMAGING — MG MM DIGITAL SCREENING BILAT W/ TOMO AND CAD
8 series · 8 of 24 positions shown · non-contrast
Comparison: Previous exam(s).

CLINICAL DATA: Screening.

EXAM:
DIGITAL SCREENING BILATERAL MAMMOGRAM WITH TOMOSYNTHESIS AND CAD
TECHNIQUE: Bilateral screening digital craniocaudal and mediolateral oblique
mammograms were obtained. Bilateral screening digital breast
tomosynthesis was performed. The images were evaluated with
computer-aided detection.

[R MLO synth-2D]
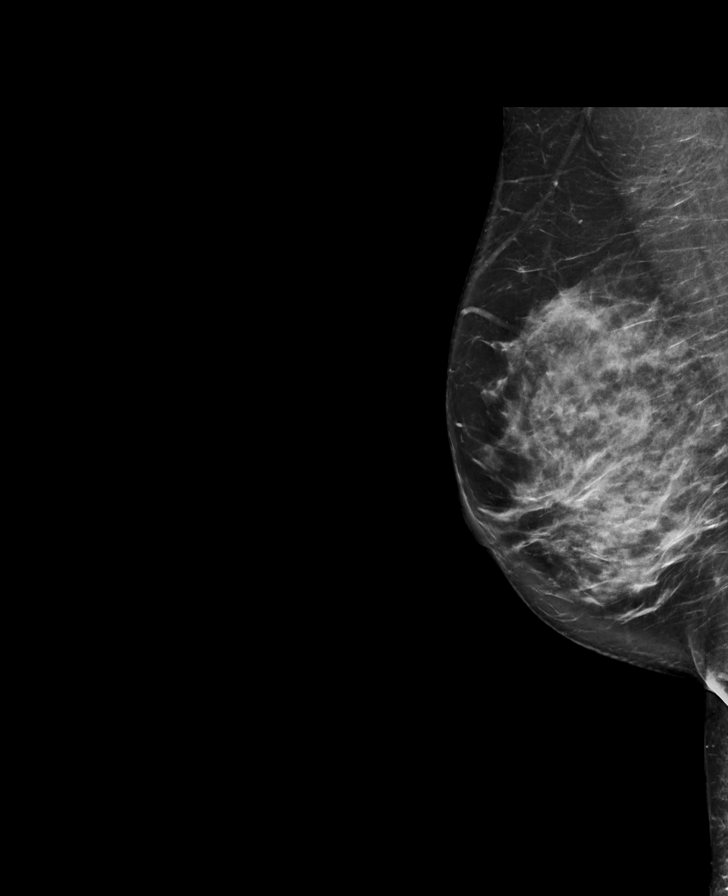

[R CC synth-2D]
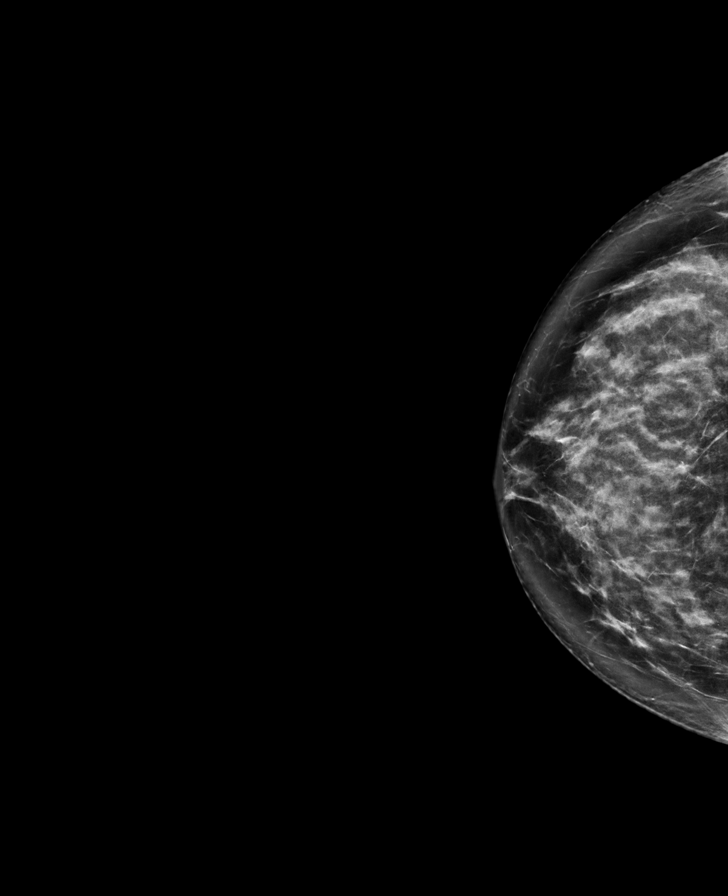

[L CC synth-2D]
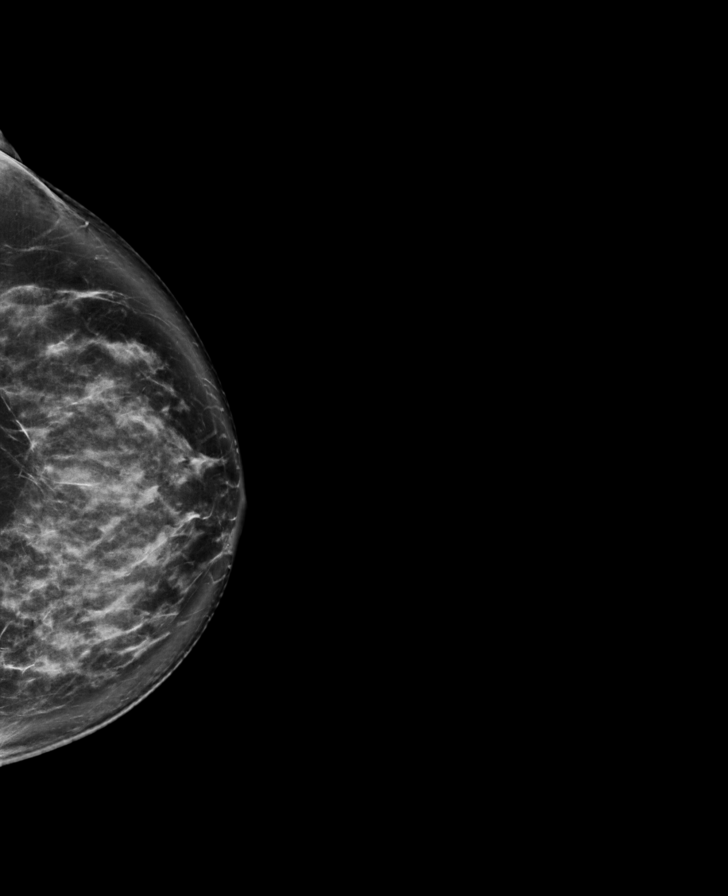

[L MLO synth-2D]
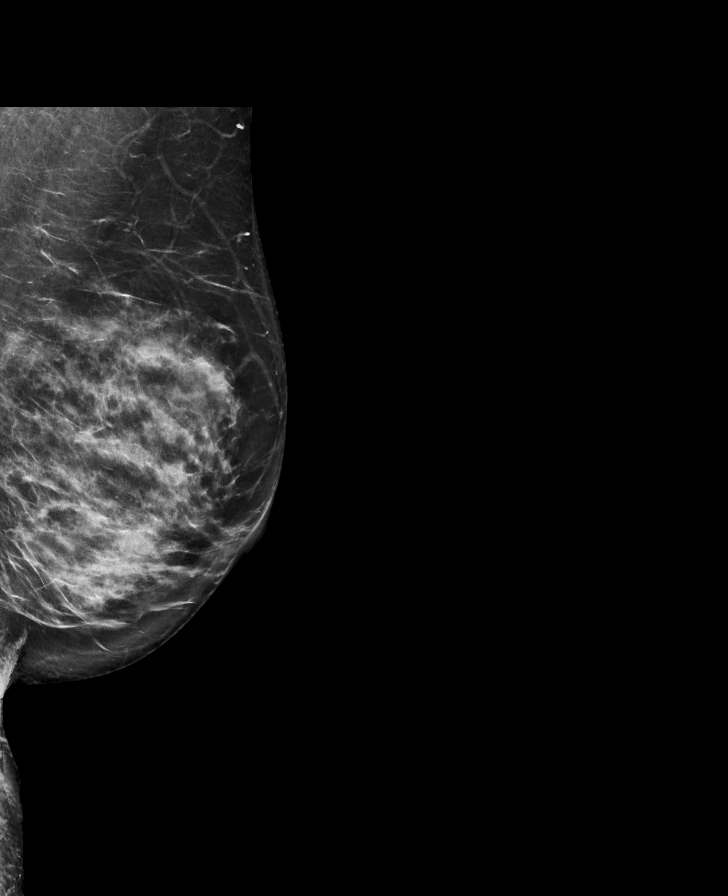

[R CC tomo · tomo slice 43/86.0]
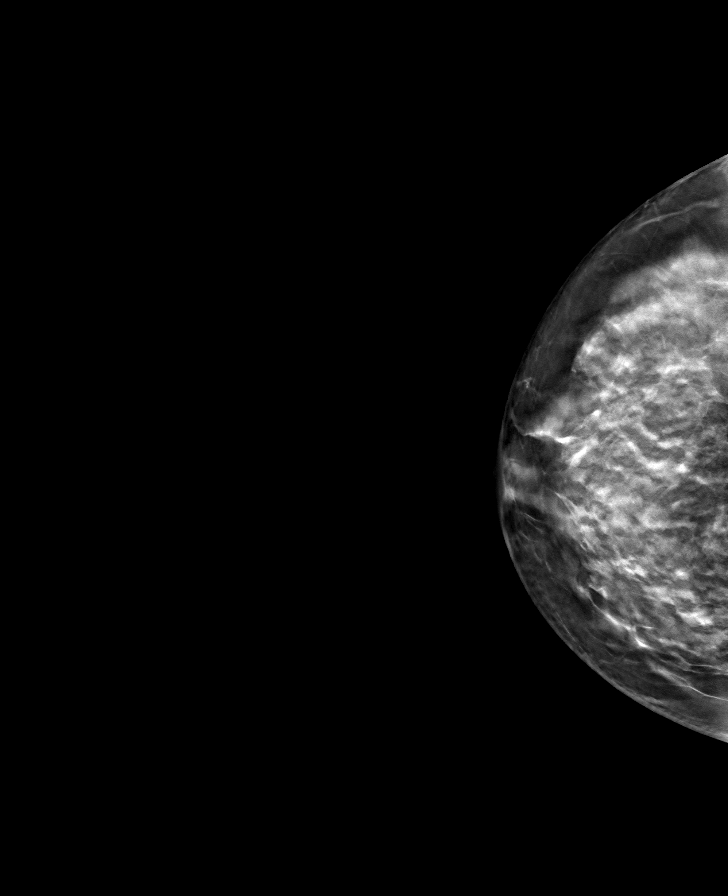

[L MLO tomo · tomo slice 46/91.0]
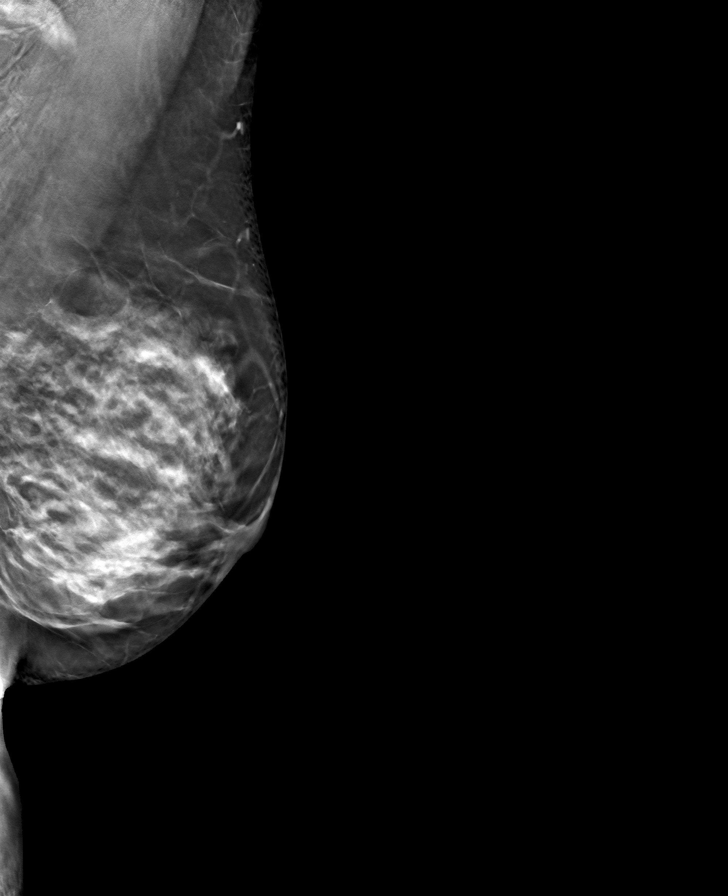

[L CC tomo · tomo slice 47/94.0]
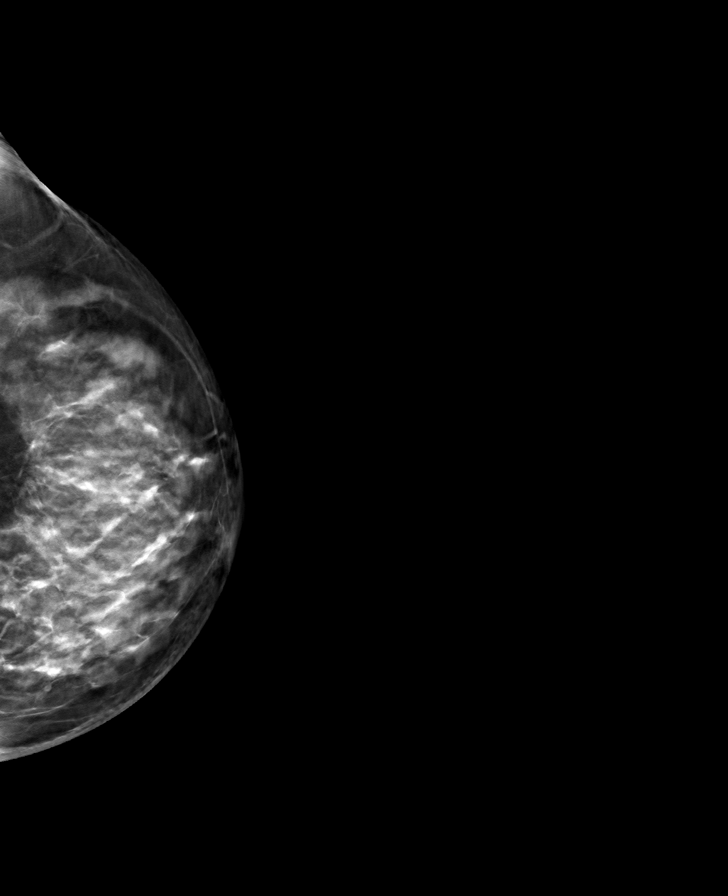

[R MLO tomo · tomo slice 47/93.0]
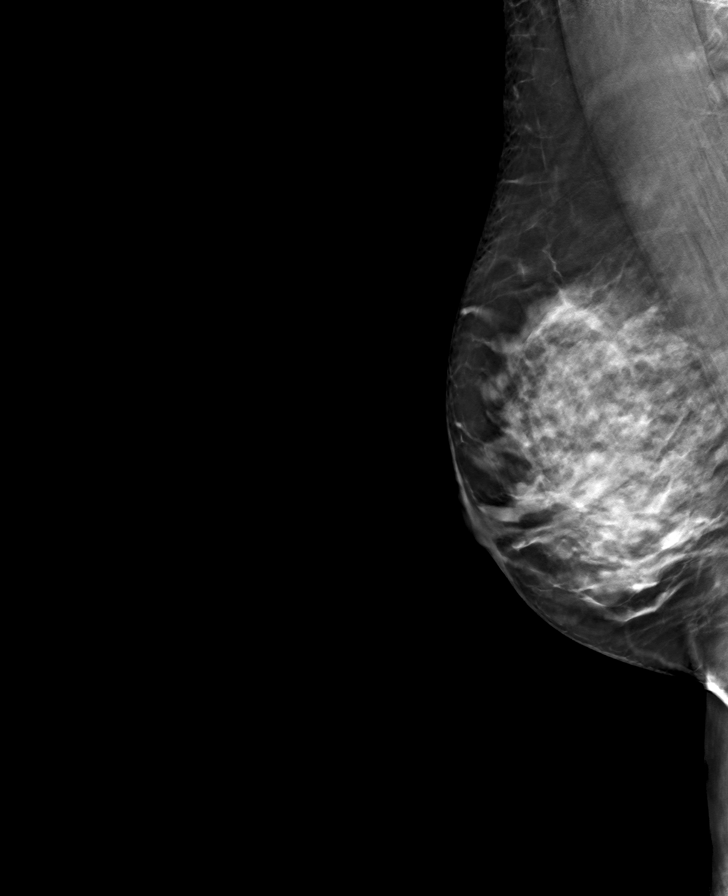

[8 of 24 positions shown; findings below may reference images not displayed]

ACR Breast Density Category c: The breast tissue is heterogeneously
dense, which may obscure small masses.
FINDINGS: In the left breast, a possible asymmetry on the MLO view warrants
further evaluation. In the right breast, no findings suspicious for
malignancy.
IMPRESSION: Further evaluation is suggested for possible asymmetry in the left
breast.

RECOMMENDATION:
Diagnostic mammogram and possibly ultrasound of the left breast.
(Code:[YS])

The patient will be contacted regarding the findings, and additional
imaging will be scheduled.

BI-RADS CATEGORY  0: Incomplete. Need additional imaging evaluation
and/or prior mammograms for comparison.

## 2022-02-11 ENCOUNTER — Other Ambulatory Visit: Payer: Self-pay | Admitting: Primary Care

## 2022-02-11 DIAGNOSIS — R928 Other abnormal and inconclusive findings on diagnostic imaging of breast: Secondary | ICD-10-CM

## 2022-02-25 ENCOUNTER — Ambulatory Visit
Admission: RE | Admit: 2022-02-25 | Discharge: 2022-02-25 | Disposition: A | Discharge status: Home / Self Care | Payer: 59 | Source: Ambulatory Visit | Attending: Primary Care | Admitting: Primary Care

## 2022-02-25 DIAGNOSIS — R928 Other abnormal and inconclusive findings on diagnostic imaging of breast: Secondary | ICD-10-CM

## 2022-02-25 IMAGING — US US BREAST*L* LIMITED INC AXILLA
1 series · 6 of 6 positions shown · non-contrast
Comparison: Previous exam(s).

CLINICAL DATA: The patient was called back for a left breast mass

EXAM:
DIGITAL DIAGNOSTIC UNILATERAL LEFT MAMMOGRAM WITH TOMOSYNTHESIS AND
CAD; ULTRASOUND LEFT BREAST LIMITED
TECHNIQUE: Left digital diagnostic mammography and breast tomosynthesis was
performed. The images were evaluated with computer-aided detection.;
Targeted ultrasound examination of the left breast was performed.

[Series 1: us breast*left* limited inc axilla · 0.06mm/px · 6 of 6 slices shown]
[im 1/6]
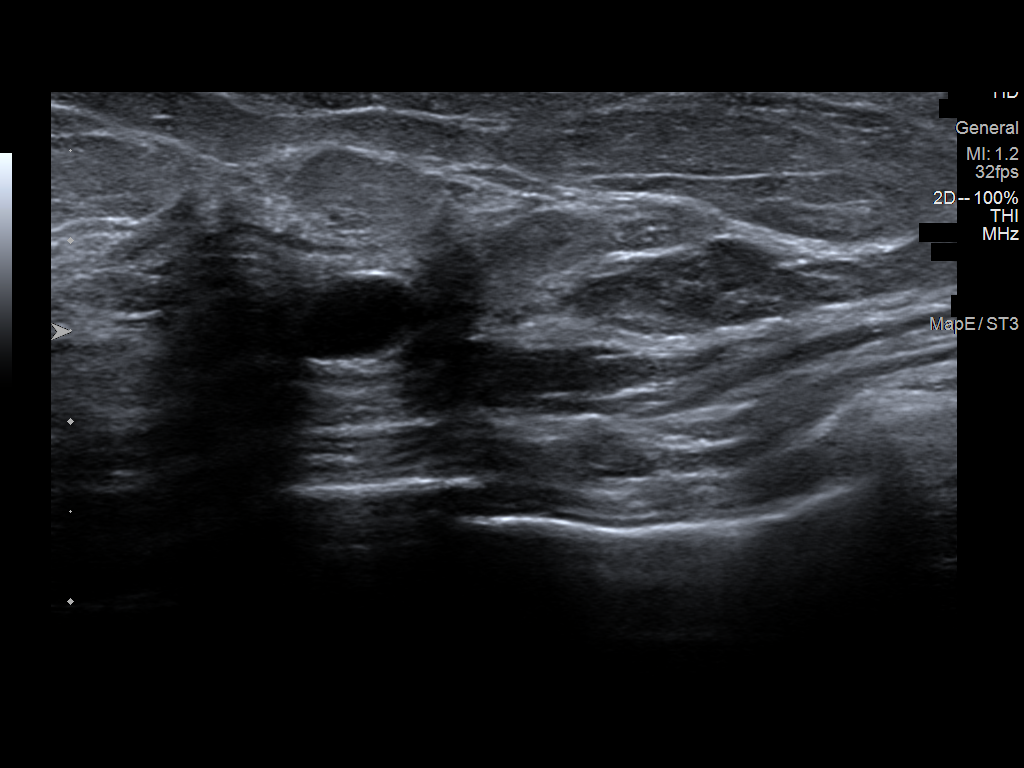
[im 2/6]
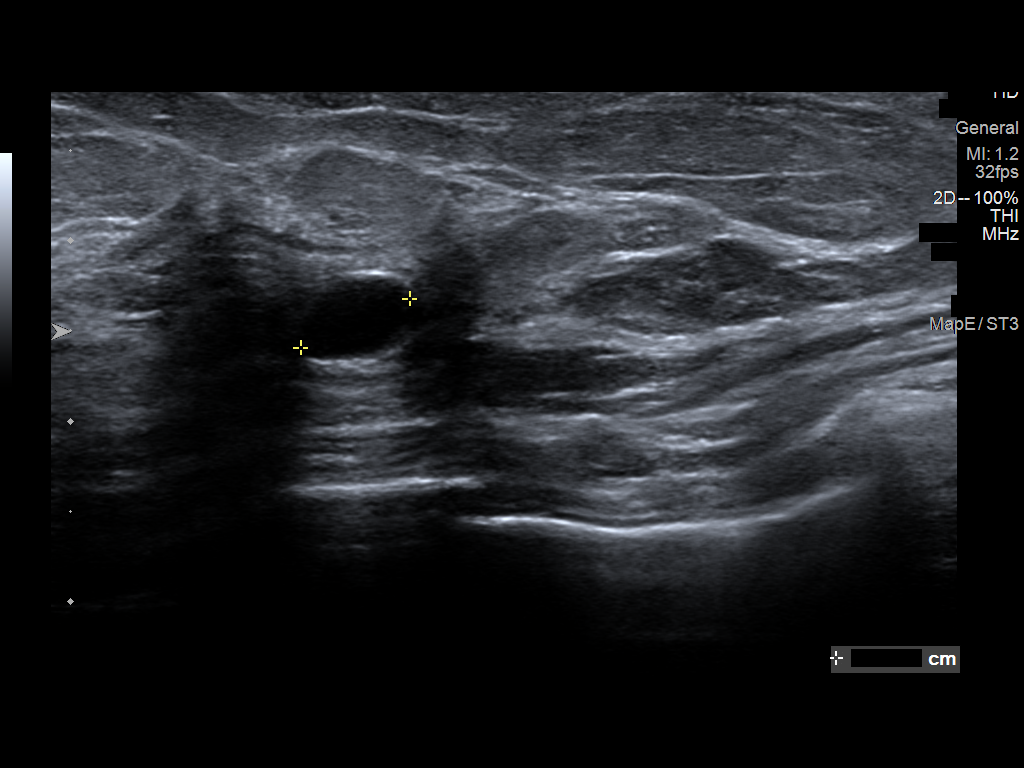
[im 3/6]
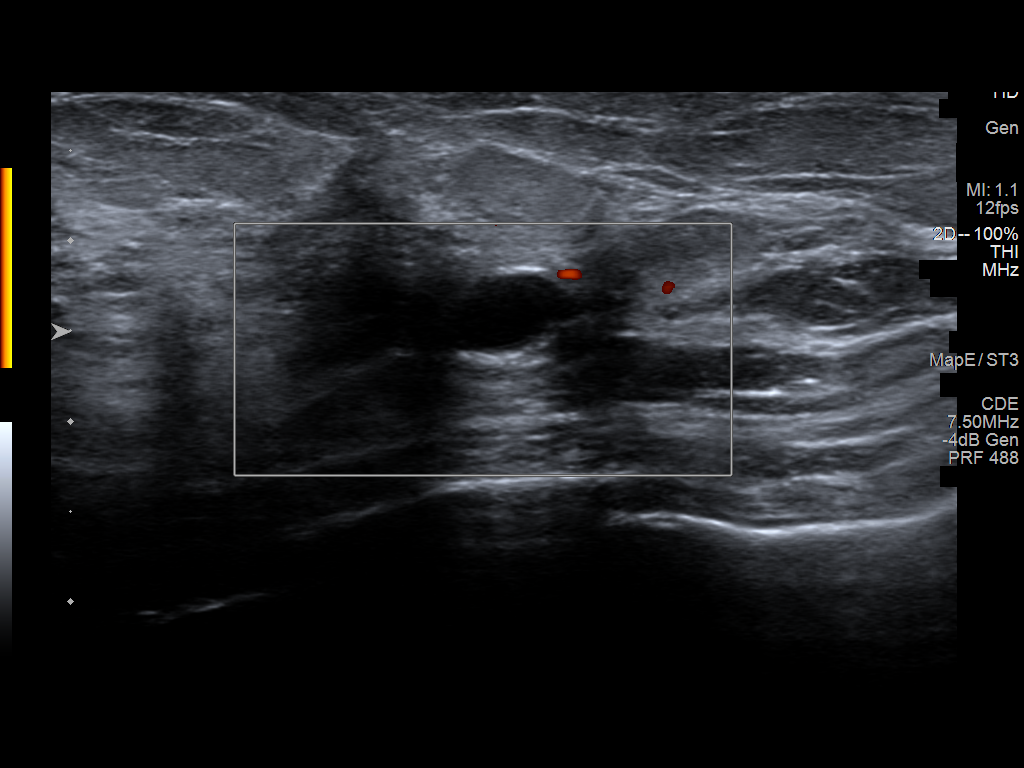
[im 4/6]
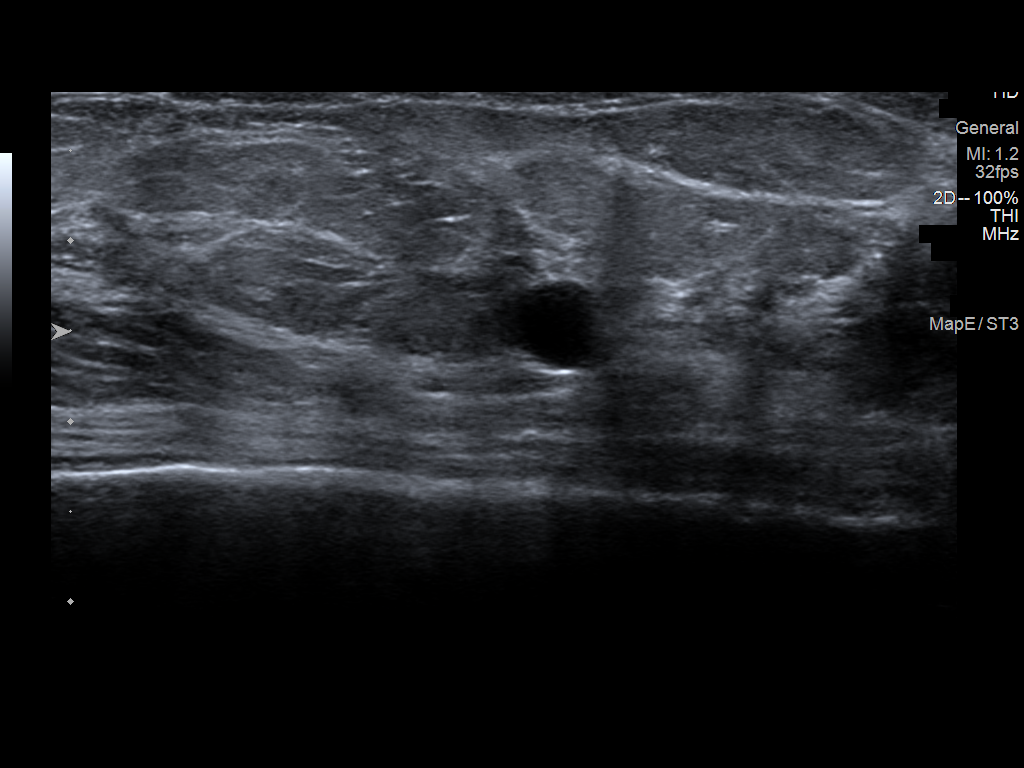
[im 5/6]
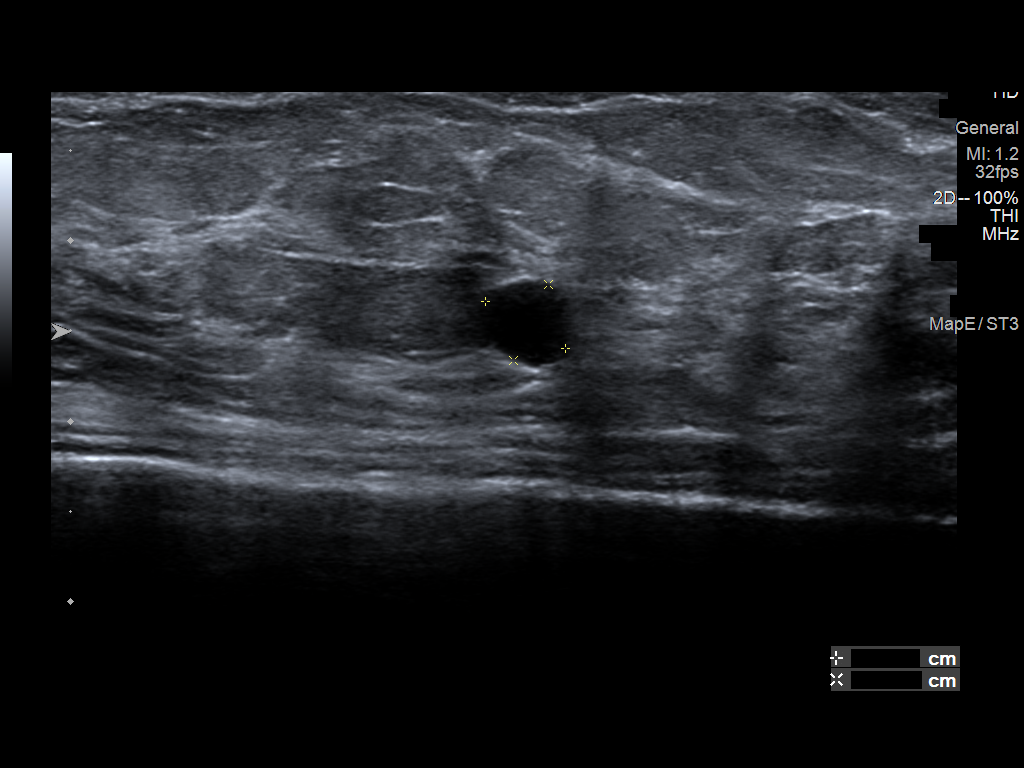
[im 6/6]
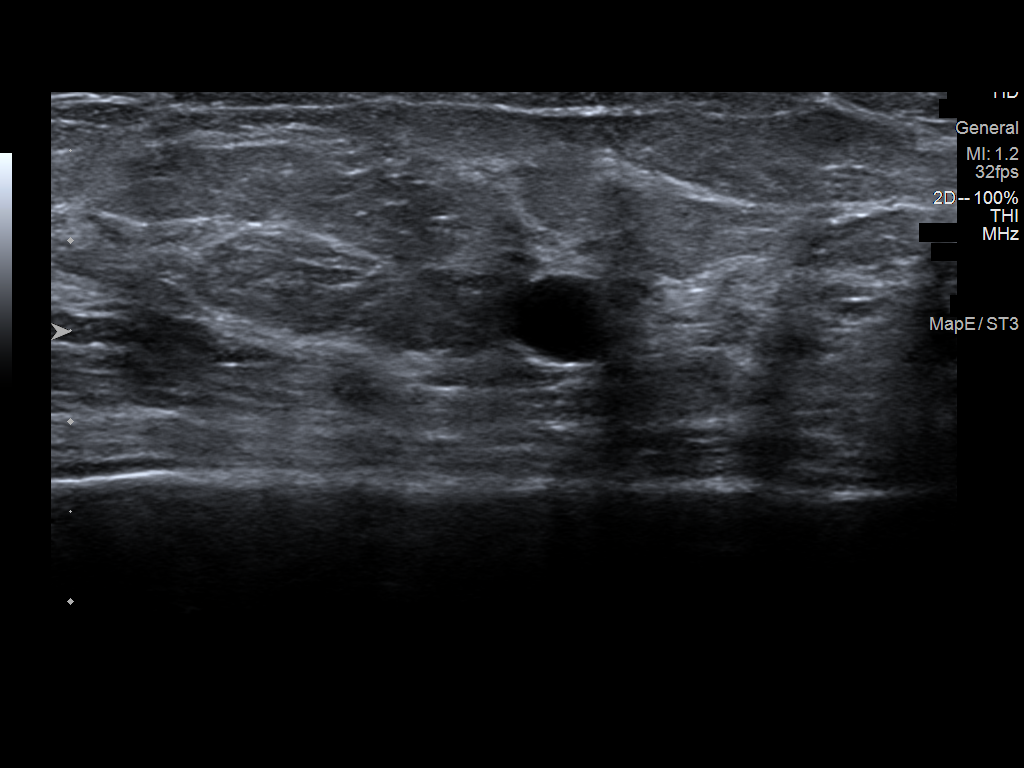

[6 of 6 positions shown; findings below may reference images not displayed]

ACR Breast Density Category c: The breast tissue is heterogeneously
dense, which may obscure small masses.
FINDINGS: There is a mass in the lower outer left breast. No other suspicious
findings.

Targeted ultrasound is performed, showing a simple cyst in the left
breast at 4 o'clock correlating with the mammographic finding.
IMPRESSION: Fibrocystic changes.  No evidence of malignancy.

RECOMMENDATION:
Annual screening mammography.

I have discussed the findings and recommendations with the patient.
If applicable, a reminder letter will be sent to the patient
regarding the next appointment.

BI-RADS CATEGORY  2: Benign.

## 2022-02-25 IMAGING — MG MM DIGITAL DIAGNOSTIC UNILAT*L* W/ TOMO W/ CAD
4 series · 4 of 12 positions shown · non-contrast
Comparison: Previous exam(s).

CLINICAL DATA: The patient was called back for a left breast mass

EXAM:
DIGITAL DIAGNOSTIC UNILATERAL LEFT MAMMOGRAM WITH TOMOSYNTHESIS AND
CAD; ULTRASOUND LEFT BREAST LIMITED
TECHNIQUE: Left digital diagnostic mammography and breast tomosynthesis was
performed. The images were evaluated with computer-aided detection.;
Targeted ultrasound examination of the left breast was performed.

[L MLO synth-2D]
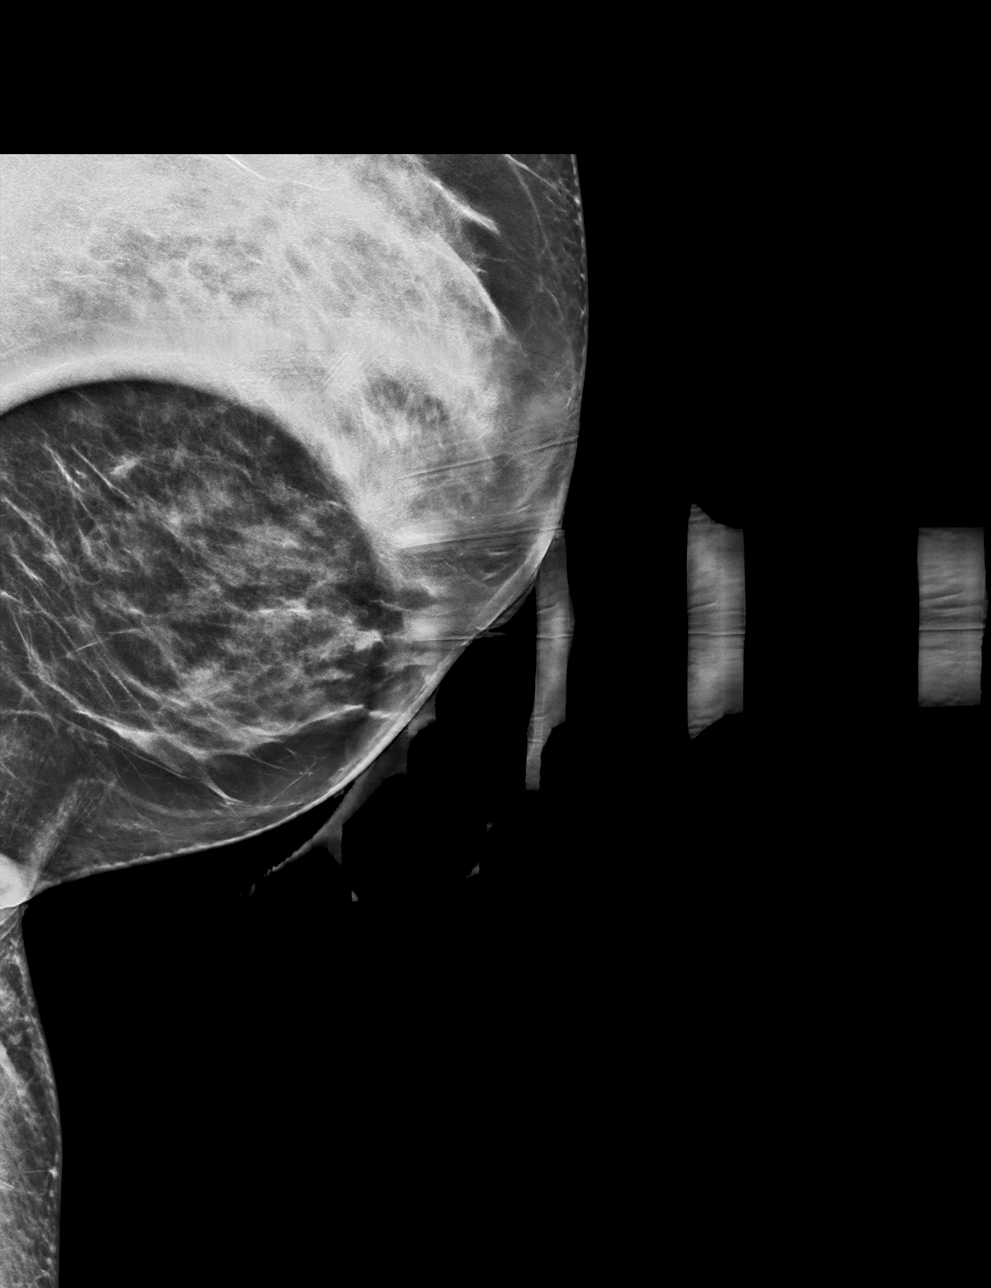

[L ML synth-2D]
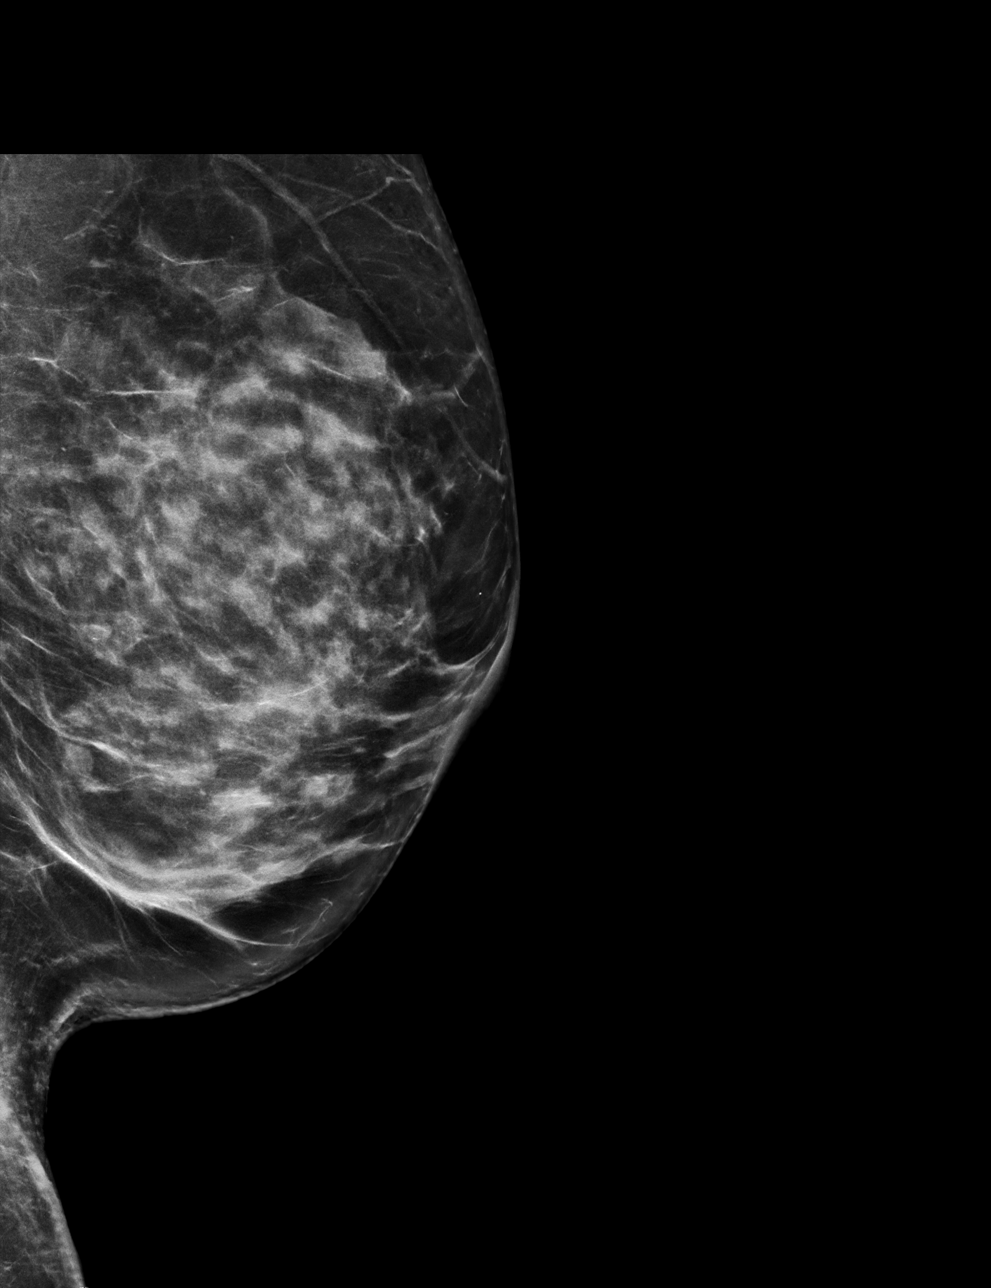

[L MLO tomo · tomo slice 32/63.0]
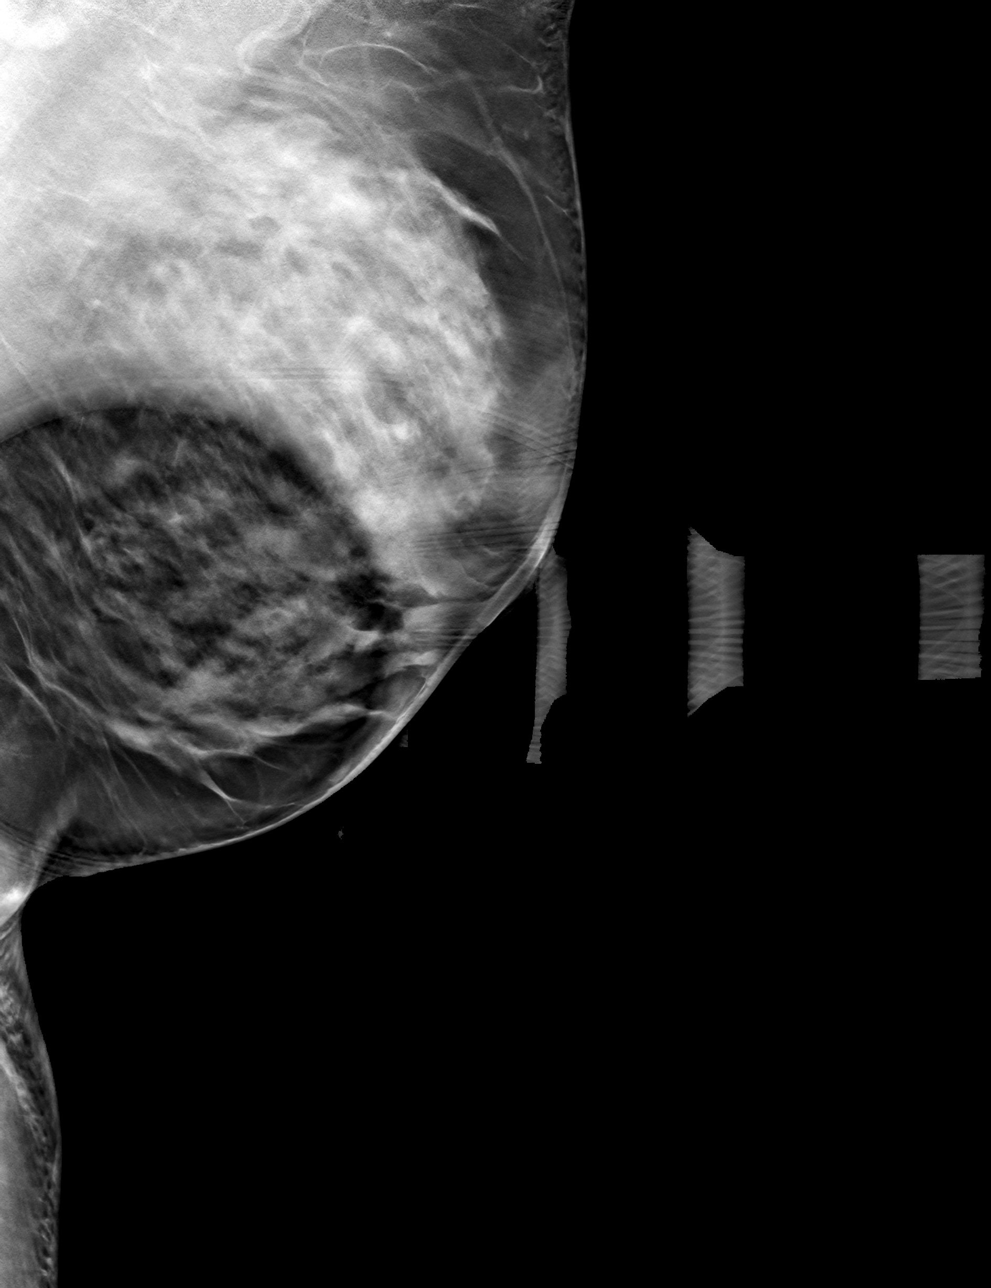

[L ML tomo · tomo slice 37/73.0]
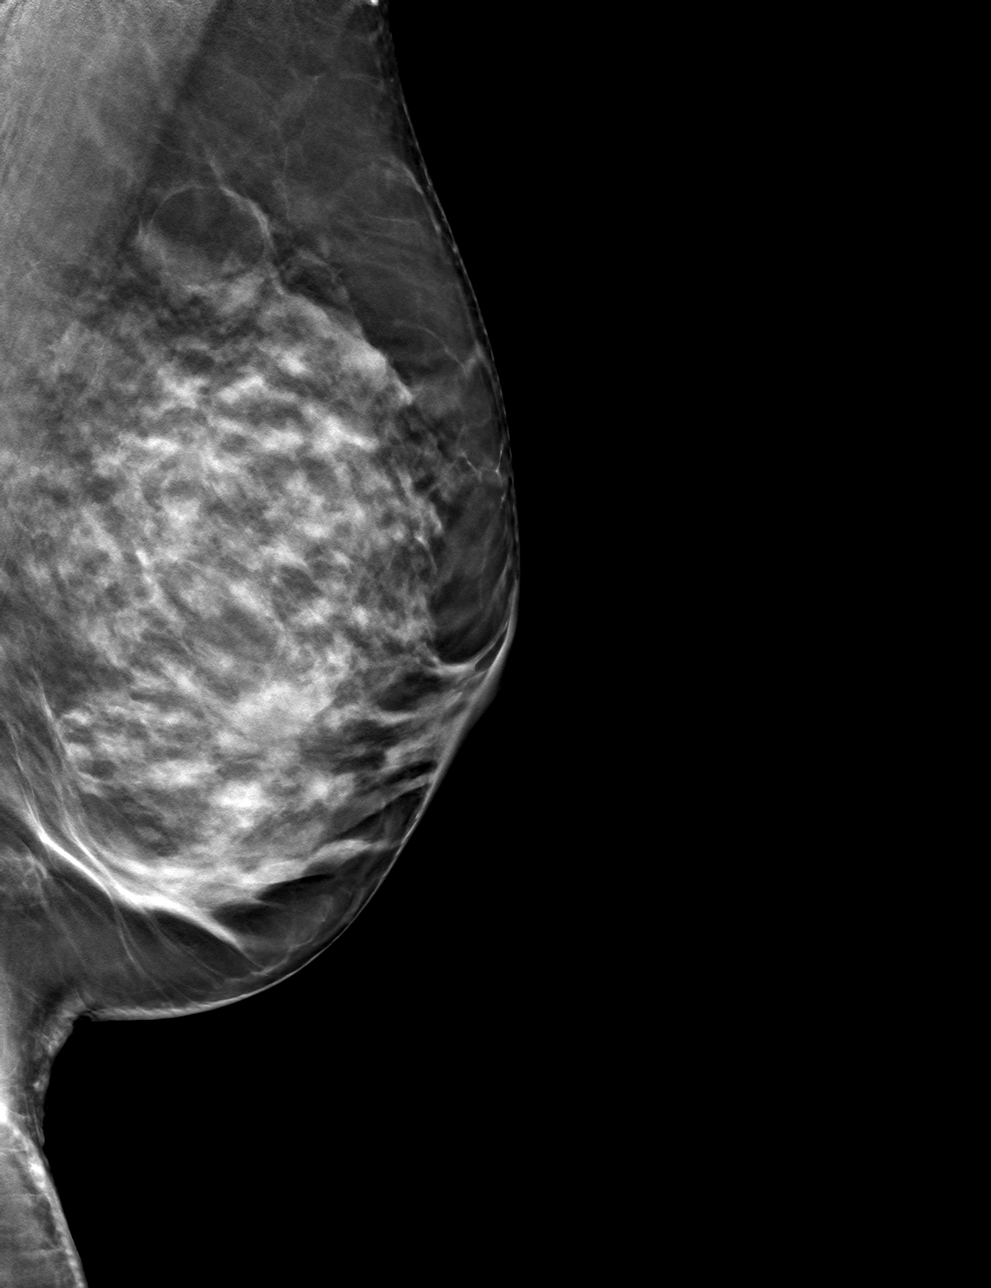

[4 of 12 positions shown; findings below may reference images not displayed]

ACR Breast Density Category c: The breast tissue is heterogeneously
dense, which may obscure small masses.
FINDINGS: There is a mass in the lower outer left breast. No other suspicious
findings.

Targeted ultrasound is performed, showing a simple cyst in the left
breast at 4 o'clock correlating with the mammographic finding.
IMPRESSION: Fibrocystic changes.  No evidence of malignancy.

RECOMMENDATION:
Annual screening mammography.

I have discussed the findings and recommendations with the patient.
If applicable, a reminder letter will be sent to the patient
regarding the next appointment.

BI-RADS CATEGORY  2: Benign.

## 2022-05-23 ENCOUNTER — Ambulatory Visit (INDEPENDENT_AMBULATORY_CARE_PROVIDER_SITE_OTHER): Payer: 59 | Admitting: Podiatry

## 2022-05-23 DIAGNOSIS — S86312A Strain of muscle(s) and tendon(s) of peroneal muscle group at lower leg level, left leg, initial encounter: Secondary | ICD-10-CM

## 2022-05-23 DIAGNOSIS — Z01818 Encounter for other preprocedural examination: Secondary | ICD-10-CM

## 2022-05-23 DIAGNOSIS — M7672 Peroneal tendinitis, left leg: Secondary | ICD-10-CM

## 2022-05-23 NOTE — Progress Notes (Signed)
  Subjective:  Patient ID: Heather Hudson, female    DOB: Jan 22, 1956,  MRN: [id number]  Chief Complaint  Patient presents with   Foot Problem    SURGERY CONSULT     66 y.o. female presents with the above complaint.  Patient presents for follow-up to left plantar peroneal tendinitis.  She states the pain is regressing she states is getting worse she would like to discuss surgical options.  The conservative care has not helped.  Review of Systems: Negative except as noted in the HPI. Denies N/V/F/Ch.  Past Medical History:  Diagnosis Date   Adenomatous colon polyp 2015   Colovesical fistula 2015   COVID-19 06/2020   Environmental and seasonal allergies    Hx of adenomatous colonic polyps 01/19/2021   Migraines    Motion sickness    No current outpatient medications on file.  Current Facility-Administered Medications:    0.9 %  sodium chloride infusion, 500 mL, Intravenous, Once, Gatha Mayer, MD  Social History   Tobacco Use  Smoking Status Never  Smokeless Tobacco Never    Allergies  Allergen Reactions   Aspirin Hives   Ibuprofen Hives   Other     Anesthesia (familial) SISTER HAD MALIGNANT HYPERTHERMIA   Propofol Other (See Comments)   Objective:  There were no vitals filed for this visit. There is no height or weight on file to calculate BMI. Constitutional Well developed. Well nourished.  Vascular Dorsalis pedis pulses palpable bilaterally. Posterior tibial pulses palpable bilaterally. Capillary refill normal to all digits.  No cyanosis or clubbing noted. Pedal hair growth normal.  Neurologic Normal speech. Oriented to person, place, and time. Epicritic sensation to light touch grossly present bilaterally.  Dermatologic Nails well groomed and normal in appearance. No open wounds. No skin lesions.  Orthopedic:  pain on palpation left lateral foot.   pain at the insertion of the peroneal tendon.  pain with resisted dorsiflexion eversion of the foot.  Some  pain along the course of the metatarsal base as well.  No extensor or flexor tendinitis clinically appreciated   Radiographs: 3 views of skeletally mature adult left foot: No stress fracture noted no bony abnormalities noted.  Mild midfoot arthritis noted.  Hallux limitus/arthritic changes noted to the first metatarsophalangeal joint  IMPRESSION: 1. Very mild non fluid bright partial-thickness midsubstance tear of the peroneus brevis tendon starting at the distal aspect of the fibula. 2. No joint effusion or capsular thickening is seen within the fourth and fifth tarsometatarsal joints. There is mild-to-moderate thinning of fourth tarsometatarsal cartilage.   Assessment:   1. Peroneal tendinitis, left   2. Tear of peroneal tendon, left, initial encounter   3. Encounter for preoperative examination for general surgical procedure      Plan:  Patient was evaluated and treated and all questions answered.  Left peroneal tendinitis -All questions and concerns were discussed with the patient in extensive detail -Clinically patient still continues to hurt and has not gotten better.  At this time I believe patient will benefit from surgical intervention.  She had an MRI which does show some partial thickness of the peroneal brevis tendon.  I discussed with the patient that if it continues to hurt which seems like it she will benefit from surgical evaluation and repair.  I discussed my preoperative intraoperative postoperative plan in extensive detail she states understand like to proceed with surgery.  She will be nonweightbearing to the left lower extremity after surgery.  No follow-ups on file.

## 2022-05-28 ENCOUNTER — Ambulatory Visit: Payer: 59 | Admitting: Podiatry

## 2022-05-30 ENCOUNTER — Telehealth: Payer: Self-pay | Admitting: Urology

## 2022-05-30 NOTE — Telephone Encounter (Signed)
DOS - 06/30/22  REPAIR PERONEAL TENDON LEFT --- [id number]  [employer] EFFECTIVE DATE -   SPOKE WITH JAMIE WITH [employer] AND SHE STATED THAT FOR CPT CODE 31594 NO PRIOR AUTH IS REQUIRED.  REF # [id number] 05/30/22 AT 10:36 AM EST

## 2022-06-23 NOTE — Telephone Encounter (Signed)
Noted, will evaluate as scheduled.  

## 2022-06-23 NOTE — Telephone Encounter (Signed)
Called patient added on to your schedule for 9/21. She states home readings have been 150 -180's she will give our office a call if any new symptoms or issues.

## 2022-06-26 ENCOUNTER — Encounter: Payer: Self-pay | Admitting: Primary Care

## 2022-06-26 ENCOUNTER — Ambulatory Visit (INDEPENDENT_AMBULATORY_CARE_PROVIDER_SITE_OTHER): Payer: 59 | Admitting: Primary Care

## 2022-06-26 VITALS — BP 140/90 | HR 62 | Temp 97.9°F | Ht 67.0 in | Wt 189.0 lb

## 2022-06-26 DIAGNOSIS — I10 Essential (primary) hypertension: Secondary | ICD-10-CM | POA: Insufficient documentation

## 2022-06-26 MED ORDER — LOSARTAN POTASSIUM 25 MG PO TABS: 25.0000 mg | ORAL_TABLET | Freq: Every day | ORAL | 0 refills | Status: DC

## 2022-06-26 NOTE — Progress Notes (Signed)
Established Patient Office Visit  Subjective   Patient ID: Heather Hudson, female    DOB: 06-26-1956  Age: 66 y.o. MRN: [id number]  Chief Complaint  Patient presents with   Hypertension    Bp Check    HPI  Heather Hudson is a 66 year old female with past medical history of hyperlipidemia, seasonal allergies presents today for an acute visit.   Elevated blood pressure reading: She sent a mychart message on 06/21/22 regarding her home blood pressure reading of 174/100. After that her home readings have been between 150s-180s/80s-100s. Her father has a history of hypertension. In 2022, she was found to have an elevated blood pressure reading in the office. No concerns since then. She is scheduled for surgery on her left foot. Denies any blurred, headaches, chest pain. Has decreased her caffeine intake to one cup of coffee a day. Denies any excess stress at this time. No changes to her diet. She has a history of allergies and has noticed increased post nasal drainage. Takes OTC allergy medication and her last dose was yesterday.      Patient Active Problem List   Diagnosis Date Noted   Primary hypertension 06/26/2022   Rash 10/10/2021   Elevated blood pressure reading in office without diagnosis of hypertension 02/14/2021   Hx of adenomatous colonic polyps 01/19/2021   Lower abdominal pain 12/23/2019   Preventative health care 09/07/2018   Hyperlipidemia 09/07/2018   Environmental and seasonal allergies 01/26/2018   Motion sickness 01/26/2018   Past Medical History:  Diagnosis Date   Adenomatous colon polyp 2015   Colovesical fistula 2015   COVID-19 06/2020   Environmental and seasonal allergies    Hx of adenomatous colonic polyps 01/19/2021   Migraines    Motion sickness    Past Surgical History:  Procedure Laterality Date   COLONOSCOPY  2015   Plus others   LAPAROSCOPIC LEFT COLON RESECTION  2015   Colovesical fistula, [place]   WISDOM TOOTH EXTRACTION     Social History    Tobacco Use   Smoking status: Never   Smokeless tobacco: Never  Vaping Use   Vaping Use: Never used  Substance Use Topics   Alcohol use: Yes    Alcohol/week: 4.0 - 5.0 standard drinks of alcohol    Types: 4 - 5 Glasses of wine per week   Drug use: No   Family History  Problem Relation Age of Onset   Heart disease Mother    Diabetes Mother    Heart disease Father    Prostate cancer Father    Malignant hyperthermia Sister    Pancreatic cancer Maternal Aunt    Diabetes Brother    Colon cancer Neg Hx    Esophageal cancer Neg Hx    Stomach cancer Neg Hx    Liver disease Neg Hx    Breast cancer Neg Hx    Allergies  Allergen Reactions   Aspirin Hives   Ibuprofen Hives   Other     Anesthesia (familial) SISTER HAD MALIGNANT HYPERTHERMIA   Propofol Other (See Comments)      Review of Systems  Eyes:  Negative for blurred vision.  Respiratory:  Negative for sputum production and shortness of breath.   Cardiovascular:  Negative for chest pain, palpitations and leg swelling.      Objective:     BP (!) 140/90   Pulse 62   Temp 97.9 F (36.6 C) (Temporal)   Ht '5\' 7"'$  (1.702 m)   Wt 189  lb (85.7 kg)   SpO2 98%   BMI 29.60 kg/m  BP Readings from Last 3 Encounters:  06/26/22 (!) 140/90  01/16/22 128/76  10/10/21 128/68   Wt Readings from Last 3 Encounters:  06/26/22 189 lb (85.7 kg)  01/16/22 189 lb (85.7 kg)  10/10/21 186 lb (84.4 kg)      Physical Exam Vitals reviewed.  Constitutional:      Appearance: Normal appearance.  Cardiovascular:     Rate and Rhythm: Normal rate and regular rhythm.     Pulses: Normal pulses.     Heart sounds: Normal heart sounds.  Pulmonary:     Effort: Pulmonary effort is normal.     Breath sounds: Normal breath sounds.  Neurological:     Mental Status: She is alert and oriented to person, place, and time.      No results found for any visits on 06/26/22.     The 10-year ASCVD risk score (Arnett DK, et al., 2019) is:  9.1%    Assessment & Plan:   Problem List Items Addressed This Visit       Cardiovascular and Mediastinum   Primary hypertension - Primary    Multiple elevated BP readings.   Agreed to start Losartan 25 mg daily. Reviewed labs from April, 2023.   Will follow up in 2-3 weeks.  Will recheck labs then.       Relevant Medications   losartan (COZAAR) 25 MG tablet    No follow-ups on file.    Tinnie Gens, BSN-RN, DNP STUDENT

## 2022-06-26 NOTE — Progress Notes (Signed)
Subjective:    Patient ID: Heather Hudson, female    DOB: 23-Apr-1956, 66 y.o.   MRN: [id number]  Hypertension Pertinent negatives include no chest pain, headaches or shortness of breath.    Heather Hudson is a very pleasant 66 y.o. female with a history of hyperlipidemia, elevated blood pressure readings who presents today to discuss elevated blood pressure readings.  No formal diagnosis of hypertension. She called our office last week with reports of elevated home BP readings ranging in the 979'G-921'J systolic. Given this information she was asked to come in for evaluation.  Since last week she's continued to notice elevated BP readings in the 150's-170's/80's to low 100's. She denies chest pain, blurred vision, headaches, lower extremity edema, increased stress, changes in her diet. She's been limiting caffeine intake.   She has a family history of hypertension in her father and sister. She has been taking a daily allergy pill, unsure of the name.   BP Readings from Last 3 Encounters:  06/26/22 (!) 140/90  01/16/22 128/76  10/10/21 128/68        Review of Systems  Eyes:  Negative for visual disturbance.  Respiratory:  Negative for shortness of breath.   Cardiovascular:  Negative for chest pain.  Neurological:  Negative for dizziness and headaches.         Past Medical History:  Diagnosis Date   Adenomatous colon polyp 2015   Colovesical fistula 2015   COVID-19 06/2020   Environmental and seasonal allergies    Hx of adenomatous colonic polyps 01/19/2021   Migraines    Motion sickness     Social History   Socioeconomic History   Marital status: Married    Spouse name: Not on file   Number of children: Not on file   Years of education: Not on file   Highest education level: Not on file  Occupational History   Not on file  Tobacco Use   Smoking status: Never   Smokeless tobacco: Never  Vaping Use   Vaping Use: Never used  Substance and Sexual Activity    Alcohol use: Yes    Alcohol/week: 4.0 - 5.0 standard drinks of alcohol    Types: 4 - 5 Glasses of wine per week   Drug use: No   Sexual activity: Not Currently  Other Topics Concern   Not on file  Social History Narrative   Married.  No children.   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   Enjoys wine tasting.    1-2 caffeinated drinks a day 0-1 alcoholic never smoker no drug use   Sister Di Kindle works [employer] controller [employer] as Therapist, sports   Social Determinants of Radio broadcast assistant Strain: Not on [employer] Insecurity: Not on file  Transportation Needs: Not on file  Physical Activity: Not on file  Stress: Not on file  Social Connections: Not on file  Intimate Partner Violence: Not on file    Past Surgical History:  Procedure Laterality Date   COLONOSCOPY  2015   Plus others   LAPAROSCOPIC LEFT COLON RESECTION  2015   Colovesical fistula, [place]   WISDOM TOOTH EXTRACTION      Family History  Problem Relation Age of Onset   Heart disease Mother    Diabetes Mother    Heart disease Father  Prostate cancer Father    Malignant hyperthermia Sister    Pancreatic cancer Maternal Aunt    Diabetes Brother    Colon cancer Neg Hx    Esophageal cancer Neg Hx    Stomach cancer Neg Hx    Liver disease Neg Hx    Breast cancer Neg Hx     Allergies  Allergen Reactions   Aspirin Hives   Ibuprofen Hives   Other     Anesthesia (familial) SISTER HAD MALIGNANT HYPERTHERMIA   Propofol Other (See Comments)    No current outpatient medications on file prior to visit.   Current Facility-Administered Medications on File Prior to Visit  Medication Dose Route Frequency Provider Last Rate Last Admin   0.9 %  sodium chloride infusion  500 mL Intravenous Once Gatha Mayer, MD        BP (!) 140/90   Pulse 62    Temp 97.9 F (36.6 C) (Temporal)   Ht '5\' 7"'$  (1.702 m)   Wt 189 lb (85.7 kg)   SpO2 98%   BMI 29.60 kg/m  Objective:   Physical Exam Cardiovascular:     Rate and Rhythm: Normal rate and regular rhythm.  Pulmonary:     Effort: Pulmonary effort is normal.     Breath sounds: Normal breath sounds.  Musculoskeletal:     Cervical back: Neck supple.  Skin:    General: Skin is warm and dry.           Assessment & Plan:   Problem List Items Addressed This Visit   None      [name], NP

## 2022-06-26 NOTE — Assessment & Plan Note (Addendum)
Uncontrolled, new diagnosis with multiple elevated BP readings.   Agreed to start Losartan 25 mg daily. Reviewed labs from April, 2023.   Will follow up in 2-3 weeks.  Will recheck BMP then.   I evaluated patient, was consulted regarding treatment, and agree with assessment and plan per Tinnie Gens, RN, DNP student.   Allie Bossier, NP-C

## 2022-06-26 NOTE — Patient Instructions (Signed)
Take one tablet of Losartan once daily. Continue to monitor your blood pressure at home. Let us know if you have any adverse effects.   Follow up in 2-3 weeks for blood pressure and lab work.   It was a pleasure to see you today!

## 2022-06-30 ENCOUNTER — Other Ambulatory Visit: Payer: Self-pay | Admitting: Podiatry

## 2022-06-30 DIAGNOSIS — S86112A Strain of other muscle(s) and tendon(s) of posterior muscle group at lower leg level, left leg, initial encounter: Secondary | ICD-10-CM | POA: Diagnosis not present

## 2022-06-30 DIAGNOSIS — M7672 Peroneal tendinitis, left leg: Secondary | ICD-10-CM

## 2022-06-30 MED ORDER — OXYCODONE-ACETAMINOPHEN 5-325 MG PO TABS: 1.0000 | ORAL_TABLET | ORAL | 0 refills | Status: DC | PRN

## 2022-07-09 ENCOUNTER — Ambulatory Visit (INDEPENDENT_AMBULATORY_CARE_PROVIDER_SITE_OTHER): Payer: PPO | Admitting: Podiatry

## 2022-07-09 DIAGNOSIS — M7672 Peroneal tendinitis, left leg: Secondary | ICD-10-CM

## 2022-07-09 DIAGNOSIS — Z9889 Other specified postprocedural states: Secondary | ICD-10-CM

## 2022-07-09 NOTE — Progress Notes (Signed)
  Subjective:  Patient ID: Heather Hudson, female    DOB: 06-19-1956,  MRN: [id number]  Chief Complaint  Patient presents with   Routine Post Op    POV #1 DOS 06/30/2022 LT PERONEAL TENDON REPAIR    DOS: 06/30/2022 Procedure: Left peroneal tendon repair  66 y.o. female returns for post-op check.  Patient states she is doing well minimal pain.  All nonweightbearing to the left lower extremity bandages clean dry and intact.  No nausea fever chills vomiting  Review of Systems: Negative except as noted in the HPI. Denies N/V/F/Ch.  Past Medical History:  Diagnosis Date   Adenomatous colon polyp 2015   Colovesical fistula 2015   COVID-19 06/2020   Environmental and seasonal allergies    Hx of adenomatous colonic polyps 01/19/2021   Migraines    Motion sickness     Current Outpatient Medications:    losartan (COZAAR) 25 MG tablet, Take 1 tablet (25 mg total) by mouth daily. For blood pressure., Disp: 30 tablet, Rfl: 0   oxyCODONE-acetaminophen (PERCOCET) 5-325 MG tablet, Take 1 tablet by mouth every 4 (four) hours as needed for severe pain., Disp: 30 tablet, Rfl: 0  Current Facility-Administered Medications:    0.9 %  sodium chloride infusion, 500 mL, Intravenous, Once, Gatha Mayer, MD  Social History   Tobacco Use  Smoking Status Never  Smokeless Tobacco Never    Allergies  Allergen Reactions   Aspirin Hives   Ibuprofen Hives   Other     Anesthesia (familial) SISTER HAD MALIGNANT HYPERTHERMIA   Propofol Other (See Comments)   Objective:  There were no vitals filed for this visit. There is no height or weight on file to calculate BMI. Constitutional Well developed. Well nourished.  Vascular Foot warm and well perfused. Capillary refill normal to all digits.   Neurologic Normal speech. Oriented to person, place, and time. Epicritic sensation to light touch grossly present bilaterally.  Dermatologic Skin healing well without signs of infection. Skin edges well  coapted without signs of infection.  Orthopedic: Tenderness to palpation noted about the surgical site.   Radiographs: None Assessment:   1. Peroneal tendinitis, left   2. S/P foot surgery    Plan:  Patient was evaluated and treated and all questions answered.  S/p foot surgery left -Progressing as expected post-operatively. -XR: None -WB Status: Nonweightbearing to the left lower extremity in knee scooter -Sutures: Intact.  No clinical signs of dehiscence noted.  No complication noted. -Medications: None -Foot redressed.  No follow-ups on file.

## 2022-07-15 ENCOUNTER — Ambulatory Visit (INDEPENDENT_AMBULATORY_CARE_PROVIDER_SITE_OTHER): Payer: PPO | Admitting: Primary Care

## 2022-07-15 ENCOUNTER — Other Ambulatory Visit: Payer: Self-pay | Admitting: Primary Care

## 2022-07-15 ENCOUNTER — Encounter: Payer: Self-pay | Admitting: Primary Care

## 2022-07-15 VITALS — BP 132/80 | HR 70 | Temp 97.3°F | Ht 67.0 in

## 2022-07-15 DIAGNOSIS — I10 Essential (primary) hypertension: Secondary | ICD-10-CM

## 2022-07-15 LAB — BASIC METABOLIC PANEL
BUN: 17 mg/dL (ref 6–23)
CO2: 24 mEq/L (ref 19–32)
Calcium: 10.5 mg/dL (ref 8.4–10.5)
Chloride: 107 mEq/L (ref 96–112)
Creatinine, Ser: 0.76 mg/dL (ref 0.40–1.20)
GFR: 82.42 mL/min (ref >60.00)
Glucose, Bld: 106 mg/dL — ABNORMAL HIGH (ref 70–99)
Potassium: 4.1 mEq/L (ref 3.5–5.1)
Sodium: 139 mEq/L (ref 135–145)

## 2022-07-15 MED ORDER — LOSARTAN POTASSIUM 25 MG PO TABS: 25.0000 mg | ORAL_TABLET | Freq: Every day | ORAL | 1 refills | Status: DC

## 2022-07-15 NOTE — Patient Instructions (Signed)
Stop by the lab prior to leaving today. I will notify you of your results once received.   Continue losartan 25 mg daily for blood pressure.   Set up your Welcome to Medicare visit for April 2024.  It was a pleasure to see you today!

## 2022-07-15 NOTE — Assessment & Plan Note (Signed)
Improved!  Continue losartan 25 mg daily. BMP pending. Will send refills once labs return.

## 2022-07-15 NOTE — Progress Notes (Signed)
Subjective:    Patient ID: Heather Hudson, female    DOB: 1956-08-19, 65 y.o.   MRN: [id number]  HPI  Heather Hudson is a very pleasant 66 y.o. female with a history of hypertension, hyperlipidemia who presents today for follow up of hypertension.  She was last evaluated on 06/26/22 for several elevated BP readings, mostly home readings. During her visit her BP was noted to be above goal. Given this information, we initiated losartan 25 mg daily. She is here for follow up today.  Since her last visit she hasn't been checking her BP at home. She denies headaches, dizziness, chest pain.   BP Readings from Last 3 Encounters:  07/15/22 132/80  06/26/22 (!) 140/90  01/16/22 128/76        Review of Systems  Respiratory:  Negative for shortness of breath.   Cardiovascular:  Negative for chest pain.  Neurological:  Negative for dizziness and headaches.         Past Medical History:  Diagnosis Date   Adenomatous colon polyp 2015   Colovesical fistula 2015   COVID-19 06/2020   Environmental and seasonal allergies    Hx of adenomatous colonic polyps 01/19/2021   Migraines    Motion sickness     Social History   Socioeconomic History   Marital status: Married    Spouse name: Not on file   Number of children: Not on file   Years of education: Not on file   Highest education level: Not on file  Occupational History   Not on file  Tobacco Use   Smoking status: Never   Smokeless tobacco: Never  Vaping Use   Vaping Use: Never used  Substance and Sexual Activity   Alcohol use: Yes    Alcohol/week: 4.0 - 5.0 standard drinks of alcohol    Types: 4 - 5 Glasses of wine per week   Drug use: No   Sexual activity: Not Currently  Other Topics Concern   Not on file  Social History Narrative   Married.  No children.   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   Enjoys wine tasting.    1-2 caffeinated drinks a day 0-1 alcoholic never smoker no drug use   Sister Di Kindle works [employer] controller [employer] as Therapist, sports   Social Determinants of Radio broadcast assistant Strain: Not on [employer] Insecurity: Not on file  Transportation Needs: Not on file  Physical Activity: Not on file  Stress: Not on file  Social Connections: Not on file  Intimate Partner Violence: Not on file    Past Surgical History:  Procedure Laterality Date   COLONOSCOPY  2015   Plus others   LAPAROSCOPIC LEFT COLON RESECTION  2015   Colovesical fistula, [place]   Left peroneal tendon repair Left 2023   WISDOM TOOTH EXTRACTION      Family History  Problem Relation Age of Onset   Heart disease Mother    Diabetes Mother    Heart disease Father    Prostate cancer Father    Malignant hyperthermia Sister    Pancreatic cancer Maternal Aunt    Diabetes Brother    Colon cancer Neg Hx    Esophageal cancer Neg Hx    Stomach cancer Neg Hx    Liver disease Neg Hx  Breast cancer Neg Hx     Allergies  Allergen Reactions   Aspirin Hives   Ibuprofen Hives   Other     Anesthesia (familial) SISTER HAD MALIGNANT HYPERTHERMIA   Propofol Other (See Comments)    Current Outpatient Medications on File Prior to Visit  Medication Sig Dispense Refill   losartan (COZAAR) 25 MG tablet Take 1 tablet (25 mg total) by mouth daily. For blood pressure. 30 tablet 0   oxyCODONE-acetaminophen (PERCOCET) 5-325 MG tablet Take 1 tablet by mouth every 4 (four) hours as needed for severe pain. (Patient not taking: Reported on 07/15/2022) 30 tablet 0   Current Facility-Administered Medications on File Prior to Visit  Medication Dose Route Frequency Provider Last Rate Last Admin   0.9 %  sodium chloride infusion  500 mL Intravenous Once Gatha Mayer, MD        BP 132/80   Pulse 70   Temp (!) 97.3 F (36.3 C) (Temporal)   Ht '5\' 7"'$  (1.702 m)    SpO2 98%   BMI 29.60 kg/m  Objective:   Physical Exam Cardiovascular:     Rate and Rhythm: Normal rate and regular rhythm.  Pulmonary:     Effort: Pulmonary effort is normal.     Breath sounds: Normal breath sounds.  Musculoskeletal:     Cervical back: Neck supple.  Skin:    General: Skin is warm and dry.           Assessment & Plan:   Problem List Items Addressed This Visit       Cardiovascular and Mediastinum   Primary hypertension - Primary    Improved!  Continue losartan 25 mg daily. BMP pending. Will send refills once labs return.       Relevant Orders   Basic metabolic panel       [name], NP

## 2022-07-23 ENCOUNTER — Ambulatory Visit (INDEPENDENT_AMBULATORY_CARE_PROVIDER_SITE_OTHER): Payer: PPO | Admitting: Podiatry

## 2022-07-23 ENCOUNTER — Other Ambulatory Visit: Payer: Self-pay | Admitting: Primary Care

## 2022-07-23 DIAGNOSIS — M7672 Peroneal tendinitis, left leg: Secondary | ICD-10-CM

## 2022-07-23 DIAGNOSIS — I10 Essential (primary) hypertension: Secondary | ICD-10-CM

## 2022-07-23 DIAGNOSIS — Z9889 Other specified postprocedural states: Secondary | ICD-10-CM

## 2022-07-23 NOTE — Progress Notes (Signed)
  Subjective:  Patient ID: Heather Hudson, female    DOB: May 10, 1956,  MRN: [id number]  Chief Complaint  Patient presents with   Routine Post Op    POV #2 DOS 06/30/2022 LT PERONEAL TENDON REPAIR    DOS: 06/30/2022 Procedure: Left peroneal tendon repair  66 y.o. female returns for post-op check.  Patient states she is doing well minimal pain.  All nonweightbearing to the left lower extremity bandages clean dry and intact.  No nausea fever chills vomiting  Review of Systems: Negative except as noted in the HPI. Denies N/V/F/Ch.  Past Medical History:  Diagnosis Date   Adenomatous colon polyp 2015   Colovesical fistula 2015   COVID-19 06/2020   Environmental and seasonal allergies    Hx of adenomatous colonic polyps 01/19/2021   Migraines    Motion sickness     Current Outpatient Medications:    losartan (COZAAR) 25 MG tablet, Take 1 tablet (25 mg total) by mouth daily. For blood pressure., Disp: 90 tablet, Rfl: 1  Current Facility-Administered Medications:    0.9 %  sodium chloride infusion, 500 mL, Intravenous, Once, Gatha Mayer, MD  Social History   Tobacco Use  Smoking Status Never  Smokeless Tobacco Never    Allergies  Allergen Reactions   Aspirin Hives   Ibuprofen Hives   Other     Anesthesia (familial) SISTER HAD MALIGNANT HYPERTHERMIA   Propofol Other (See Comments)   Objective:  There were no vitals filed for this visit. There is no height or weight on file to calculate BMI. Constitutional Well developed. Well nourished.  Vascular Foot warm and well perfused. Capillary refill normal to all digits.   Neurologic Normal speech. Oriented to person, place, and time. Epicritic sensation to light touch grossly present bilaterally.  Dermatologic Skin but reepithelialized.  No signs of Deis is noted no complication noted.  Orthopedic: Tenderness to palpation noted about the surgical site.   Radiographs: None Assessment:   1. Peroneal tendinitis, left   2.  S/P foot surgery     Plan:  Patient was evaluated and treated and all questions answered.  S/p foot surgery left -Progressing as expected post-operatively. -XR: None -WB Status: Weightbearing as tolerated with a cam boot -Sutures: Removed no clinical signs of dehiscence noted.  No complication noted. -Medications: None -Patient like to hold off for physical therapy for now.  She feels comfortable doing her home exercises.  Exercises were shown  No follow-ups on file.

## 2022-07-23 NOTE — Telephone Encounter (Signed)
I spoke with Heather Hudson at [facility] [address] and she said that the losartan 25 mg refill was received but note on pts acct said needs new ins information. Pt notified as instructed and pt voiced understanding and she will contact [facility]. Nothing further needed at this time.

## 2022-08-04 ENCOUNTER — Encounter: Payer: Self-pay | Admitting: Podiatry

## 2022-08-20 ENCOUNTER — Ambulatory Visit (INDEPENDENT_AMBULATORY_CARE_PROVIDER_SITE_OTHER): Payer: 59 | Admitting: Podiatry

## 2022-08-20 DIAGNOSIS — M7672 Peroneal tendinitis, left leg: Secondary | ICD-10-CM

## 2022-08-20 DIAGNOSIS — Z9889 Other specified postprocedural states: Secondary | ICD-10-CM

## 2022-08-20 NOTE — Progress Notes (Signed)
  Subjective:  Patient ID: Heather Hudson, female    DOB: [birth year],  MRN: [id number]  Chief Complaint  Patient presents with   Routine Post Op    POV #2 DOS 06/30/2022 LT PERONEAL TENDON REPAIR    DOS: 06/30/2022 Procedure: Left peroneal tendon repair  66 y.o. female returns for post-op check.  Patient states she is doing well minimal pain.  All nonweightbearing to the left lower extremity bandages clean dry and intact.  No nausea fever chills vomiting  Review of Systems: Negative except as noted in the HPI. Denies N/V/F/Ch.  Past Medical History:  Diagnosis Date   Adenomatous colon polyp 2015   Colovesical fistula 2015   COVID-19 06/2020   Environmental and seasonal allergies    Hx of adenomatous colonic polyps 01/19/2021   Migraines    Motion sickness     Current Outpatient Medications:    losartan (COZAAR) 25 MG tablet, Take 1 tablet (25 mg total) by mouth daily. For blood pressure., Disp: 90 tablet, Rfl: 1  Current Facility-Administered Medications:    0.9 %  sodium chloride infusion, 500 mL, Intravenous, Once, Gatha Mayer, MD  Social History   Tobacco Use  Smoking Status Never  Smokeless Tobacco Never    Allergies  Allergen Reactions   Aspirin Hives   Ibuprofen Hives   Other     Anesthesia (familial) SISTER HAD MALIGNANT HYPERTHERMIA   Propofol Other (See Comments)   Objective:  There were no vitals filed for this visit. There is no height or weight on file to calculate BMI. Constitutional Well developed. Well nourished.  Vascular Foot warm and well perfused. Capillary refill normal to all digits.   Neurologic Normal speech. Oriented to person, place, and time. Epicritic sensation to light touch grossly present bilaterally.  Dermatologic Skin but reepithelialized.  No signs of Deis is noted no complication noted.  Good range of motion noted at the peroneal tendon resisted 4 out of 5.  Orthopedic: Tenderness to palpation noted about the surgical site.    Radiographs: None Assessment:   1. Peroneal tendinitis, left   2. S/P foot surgery     Plan:  Patient was evaluated and treated and all questions answered.  S/p foot surgery left -Clinically healed doing well.  She has been doing home exercises.  If any foot and ankle issues arise in future of asked her to come back and see me.  She has good range of motion and strength of the peroneal tendon.  No follow-ups on file.

## 2022-09-05 ENCOUNTER — Ambulatory Visit (INDEPENDENT_AMBULATORY_CARE_PROVIDER_SITE_OTHER): Payer: PPO | Admitting: Primary Care

## 2022-09-05 VITALS — BP 142/90 | HR 70 | Temp 98.0°F | Ht 67.0 in | Wt 188.2 lb

## 2022-09-05 DIAGNOSIS — R053 Chronic cough: Secondary | ICD-10-CM | POA: Diagnosis not present

## 2022-09-05 MED ORDER — AZITHROMYCIN 250 MG PO TABS: ORAL_TABLET | ORAL | 0 refills | Status: DC

## 2022-09-05 NOTE — Progress Notes (Signed)
Subjective:    Patient ID: Heather Hudson, female    DOB: Apr 16, 1956, 66 y.o.   MRN: [id number]  Cough Associated symptoms include postnasal drip. Pertinent negatives include no chest pain, chills, fever or sore throat.    Heather Hudson is a very pleasant 66 y.o. female with a history of hypertension, elevated blood pressure who presents today to discuss cough.  Chronic history of sinus drainage, post nasal drip, dry tickle cough from allergies.  Recent symptom onset 4-5 weeks ago with sinus drainage. She then developed dry throat, tickle to the throat, then the cough. Her cough occurs mostly during the day and night, but over the last few days she's been able to sleep through the night without waking to cough.   Cough is provoked with talking. She does notice esophageal burning during the night. She does not take medication for GERD.   She is managed on losartan 25 mg for hypertension which was initiated in late September 2023. BP readings have been in the 140's/150's/90's.   She's been taking cough drops, a daily antihistamine for the last few days. The antihistamine has helped her to sleep at night.   She denies chills, body aches, fevers. She feels well overall. She does have chronic sinus drainage and allergies. Her cough is productive with yellow sputum, this is not typical.   BP Readings from Last 3 Encounters:  09/05/22 (!) 142/90  07/15/22 132/80  06/26/22 (!) 140/90        Review of Systems  Constitutional:  Negative for chills, fatigue and fever.  HENT:  Positive for congestion and postnasal drip. Negative for sore throat.   Respiratory:  Positive for cough.   Cardiovascular:  Negative for chest pain.         Past Medical History:  Diagnosis Date   Adenomatous colon polyp 2015   Colovesical fistula 2015   COVID-19 06/2020   Environmental and seasonal allergies    Hx of adenomatous colonic polyps 01/19/2021   Migraines    Motion sickness     Social  History   Socioeconomic History   Marital status: Married    Spouse name: Not on file   Number of children: Not on file   Years of education: Not on file   Highest education level: Not on file  Occupational History   Not on file  Tobacco Use   Smoking status: Never   Smokeless tobacco: Never  Vaping Use   Vaping Use: Never used  Substance and Sexual Activity   Alcohol use: Yes    Alcohol/week: 4.0 - 5.0 standard drinks of alcohol    Types: 4 - 5 Glasses of wine per week   Drug use: No   Sexual activity: Not Currently  Other Topics Concern   Not on file  Social History Narrative   Married.  No children.   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   Enjoys wine tasting.    1-2 caffeinated drinks a day 0-1 alcoholic never smoker no drug use   Sister Di Kindle works [employer] controller [employer] as Therapist, sports   Social Determinants of Radio broadcast assistant Strain: Not on [employer] Insecurity: Not on file  Transportation Needs: Not on file  Physical Activity: Not on file  Stress: Not on file  Social Connections: Not on  file  Intimate Partner Violence: Not on file    Past Surgical History:  Procedure Laterality Date   COLONOSCOPY  2015   Plus others   LAPAROSCOPIC LEFT COLON RESECTION  2015   Colovesical fistula, [place]   Left peroneal tendon repair Left 2023   WISDOM TOOTH EXTRACTION      Family History  Problem Relation Age of Onset   Heart disease Mother    Diabetes Mother    Heart disease Father    Prostate cancer Father    Malignant hyperthermia Sister    Pancreatic cancer Maternal Aunt    Diabetes Brother    Colon cancer Neg Hx    Esophageal cancer Neg Hx    Stomach cancer Neg Hx    Liver disease Neg Hx    Breast cancer Neg Hx     Allergies  Allergen Reactions   Aspirin Hives   Ibuprofen Hives    Other     Anesthesia (familial) SISTER HAD MALIGNANT HYPERTHERMIA   Propofol Other (See Comments)    Current Outpatient Medications on File Prior to Visit  Medication Sig Dispense Refill   losartan (COZAAR) 25 MG tablet Take 1 tablet (25 mg total) by mouth daily. For blood pressure. 90 tablet 1   Current Facility-Administered Medications on File Prior to Visit  Medication Dose Route Frequency Provider Last Rate Last Admin   0.9 %  sodium chloride infusion  500 mL Intravenous Once Gatha Mayer, MD        BP (!) 142/90   Pulse 70   Temp 98 F (36.7 C) (Oral)   Ht '5\' 7"'$  (1.702 m)   Wt 188 lb 3.2 oz (85.4 kg)   SpO2 98%   BMI 29.48 kg/m  Objective:   Physical Exam Constitutional:      Appearance: She is not ill-appearing.  HENT:     Right Ear: Tympanic membrane and ear canal normal.     Left Ear: Tympanic membrane and ear canal normal.     Nose:     Right Sinus: No maxillary sinus tenderness or frontal sinus tenderness.     Left Sinus: No maxillary sinus tenderness or frontal sinus tenderness.     Mouth/Throat:     Pharynx: No posterior oropharyngeal erythema.  Eyes:     Conjunctiva/sclera: Conjunctivae normal.  Cardiovascular:     Rate and Rhythm: Normal rate and regular rhythm.  Pulmonary:     Effort: Pulmonary effort is normal.     Breath sounds: Normal breath sounds. No wheezing or rales.     Comments: Frequent congested cough noted during visit Musculoskeletal:     Cervical back: Neck supple.  Lymphadenopathy:     Cervical: No cervical adenopathy.  Skin:    General: Skin is warm and dry.           Assessment & Plan:   Problem List Items Addressed This Visit       Other   Persistent cough for 3 weeks or longer - Primary    Differentials include allergies, GERD, infection.  Lungs overall clear on exam, she doesn't appear sickly. Given sputum changes, coupled with duration of cough, will treat for potential bacterial infection.  Start Azithromycin  antibiotics for infection. Take 2 tablets by mouth today, then 1 tablet daily for 4 additional days. Continue nightly antihistamine. Recommended to consider GERD treatment for now.  She will update if no improvement.   Consider changing losartan.      Relevant Medications  azithromycin (ZITHROMAX) 250 MG tablet       [name], NP

## 2022-09-05 NOTE — Assessment & Plan Note (Addendum)
Differentials include allergies, GERD, infection.  Lungs overall clear on exam, she doesn't appear sickly. Given sputum changes, coupled with duration of cough, will treat for potential bacterial infection.  Start Azithromycin antibiotics for infection. Take 2 tablets by mouth today, then 1 tablet daily for 4 additional days. Continue nightly antihistamine. Recommended to consider GERD treatment for now.  She will update if no improvement.   Consider changing losartan.

## 2022-09-05 NOTE — Patient Instructions (Signed)
Start Azithromycin antibiotics for infection. Take 2 tablets by mouth today, then 1 tablet daily for 4 additional days.  Continue the allergy pill.  Consider taking famotidine 20 mg daily for heartburn for now.  Schedule a follow up visit for blood pressure check.  It was a pleasure to see you today!

## 2022-09-17 ENCOUNTER — Encounter: Payer: Self-pay | Admitting: Primary Care

## 2022-09-17 ENCOUNTER — Ambulatory Visit (INDEPENDENT_AMBULATORY_CARE_PROVIDER_SITE_OTHER): Payer: PPO | Admitting: Primary Care

## 2022-09-17 VITALS — BP 122/84 | HR 60 | Temp 97.3°F | Ht 67.0 in | Wt 186.0 lb

## 2022-09-17 DIAGNOSIS — I10 Essential (primary) hypertension: Secondary | ICD-10-CM | POA: Diagnosis not present

## 2022-09-17 DIAGNOSIS — R053 Chronic cough: Secondary | ICD-10-CM | POA: Diagnosis not present

## 2022-09-17 NOTE — Patient Instructions (Signed)
Resume your daily antihistamine as discussed.  Please update me in a few weeks!  It was a pleasure to see you today!

## 2022-09-17 NOTE — Assessment & Plan Note (Addendum)
Controlled!  Continue losartan 25 mg daily for now. Do not suspect losartan is the cause for her cough, but consider switching to amlodipine if cough persists.  She will update in a few weeks.

## 2022-09-17 NOTE — Assessment & Plan Note (Signed)
Marked improvement, some lingering symptoms.  Lingering symptoms are more representative of post nasal drip.  Discussed to resume antihistamine nightly.   She will update in a few weeks. If cough persists then consider switching from losartan to amlodipine.

## 2022-09-17 NOTE — Progress Notes (Signed)
Subjective:    Patient ID: Heather Hudson, female    DOB: [birth year], 66 y.o.   MRN: [id number]  HPI  Heather Hudson is a very pleasant 66 y.o. female with a history of tension, hyperlipidemia, persistent cough, motion sickness who presents today to discuss persistent cough.  She was last evaluated on 09/05/2022 for a 4 to 5-week history of sinus drainage, dry throat, tickle to the throat, cough, esophageal burning during the night.  Cough was provoked with speaking.  Given her symptoms we treated with azithromycin course.  Her nightly antihistamine was continued.  GERD treatment was considered.  Since her last visit her cough has improved overall. She does continue to experiencing coughing fits on occasion, post nasal drip, phlegm to the lower throat. She has not been taking an antihistamine or reflux medication. Her esophageal burning has resolved.   She is not checking her BP at home. She continues with losartan 25 mg daily.   BP Readings from Last 3 Encounters:  09/17/22 122/84  09/05/22 (!) 142/90  07/15/22 132/80     Review of Systems  Constitutional:  Negative for fatigue and fever.  HENT:  Positive for postnasal drip.   Respiratory:  Positive for cough.   Gastrointestinal:        Denies reflux symptoms  Neurological:  Negative for headaches.         Past Medical History:  Diagnosis Date   Adenomatous colon polyp 2015   Colovesical fistula 2015   COVID-19 06/2020   Environmental and seasonal allergies    Hx of adenomatous colonic polyps 01/19/2021   Migraines    Motion sickness     Social History   Socioeconomic History   Marital status: Married    Spouse name: Not on file   Number of children: Not on file   Years of education: Not on file   Highest education level: Not on file  Occupational History   Not on file  Tobacco Use   Smoking status: Never   Smokeless tobacco: Never  Vaping Use   Vaping Use: Never used  Substance and Sexual Activity   Alcohol  use: Yes    Alcohol/week: 4.0 - 5.0 standard drinks of alcohol    Types: 4 - 5 Glasses of wine per week   Drug use: No   Sexual activity: Not Currently  Other Topics Concern   Not on file  Social History Narrative   Married.  No children.   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   Enjoys wine tasting.    1-2 caffeinated drinks a day 0-1 alcoholic never smoker no drug use   Sister Di Kindle works Financial controller endoscopy as Therapist, sports   Social Determinants of Radio broadcast assistant Strain: Not on [employer] Insecurity: Not on file  Transportation Needs: Not on file  Physical Activity: Not on file  Stress: Not on file  Social Connections: Not on file  Intimate Partner Violence: Not on file    Past Surgical History:  Procedure Laterality Date   COLONOSCOPY  2015   Plus others   LAPAROSCOPIC LEFT COLON RESECTION  2015   Colovesical fistula, [place]   Left peroneal tendon repair Left 2023   WISDOM TOOTH EXTRACTION      Family History  Problem Relation Age of Onset  Heart disease Mother    Diabetes Mother    Heart disease Father    Prostate cancer Father    Malignant hyperthermia Sister    Pancreatic cancer Maternal Aunt    Diabetes Brother    Colon cancer Neg Hx    Esophageal cancer Neg Hx    Stomach cancer Neg Hx    Liver disease Neg Hx    Breast cancer Neg Hx     Allergies  Allergen Reactions   Aspirin Hives   Ibuprofen Hives   Other     Anesthesia (familial) SISTER HAD MALIGNANT HYPERTHERMIA   Propofol Other (See Comments)    Current Outpatient Medications on File Prior to Visit  Medication Sig Dispense Refill   losartan (COZAAR) 25 MG tablet Take 1 tablet (25 mg total) by mouth daily. For blood pressure. 90 tablet 1   Current Facility-Administered Medications on File Prior to Visit  Medication  Dose Route Frequency Provider Last Rate Last Admin   0.9 %  sodium chloride infusion  500 mL Intravenous Once Gatha Mayer, MD        BP 122/84   Pulse 60   Temp (!) 97.3 F (36.3 C) (Temporal)   Ht '5\' 7"'$  (1.702 m)   Wt 186 lb (84.4 kg)   SpO2 99%   BMI 29.13 kg/m  Objective:   Physical Exam Cardiovascular:     Rate and Rhythm: Normal rate and regular rhythm.  Pulmonary:     Effort: Pulmonary effort is normal.     Breath sounds: Normal breath sounds.  Musculoskeletal:     Cervical back: Neck supple.  Skin:    General: Skin is warm and dry.           Assessment & Plan:   Problem List Items Addressed This Visit       Cardiovascular and Mediastinum   Primary hypertension - Primary    Controlled!  Continue losartan 25 mg daily for now. Do not suspect losartan is the cause for her cough, but consider switching to amlodipine if cough persists.  She will update in a few weeks.        Other   Persistent cough for 3 weeks or longer    Marked improvement, some lingering symptoms.  Lingering symptoms are more representative of post nasal drip.  Discussed to resume antihistamine nightly.   She will update in a few weeks. If cough persists then consider switching from losartan to amlodipine.           [name], NP

## 2023-01-20 ENCOUNTER — Encounter: Payer: Self-pay | Admitting: Primary Care

## 2023-01-20 ENCOUNTER — Ambulatory Visit (INDEPENDENT_AMBULATORY_CARE_PROVIDER_SITE_OTHER): Payer: PPO | Admitting: Primary Care

## 2023-01-20 VITALS — BP 138/86 | HR 55 | Temp 97.9°F | Ht 67.0 in | Wt 189.0 lb

## 2023-01-20 DIAGNOSIS — E2839 Other primary ovarian failure: Secondary | ICD-10-CM

## 2023-01-20 DIAGNOSIS — Z0000 Encounter for general adult medical examination without abnormal findings: Secondary | ICD-10-CM

## 2023-01-20 DIAGNOSIS — E785 Hyperlipidemia, unspecified: Secondary | ICD-10-CM | POA: Diagnosis not present

## 2023-01-20 DIAGNOSIS — Z1231 Encounter for screening mammogram for malignant neoplasm of breast: Secondary | ICD-10-CM | POA: Diagnosis not present

## 2023-01-20 DIAGNOSIS — L121 Cicatricial pemphigoid: Secondary | ICD-10-CM | POA: Insufficient documentation

## 2023-01-20 DIAGNOSIS — I10 Essential (primary) hypertension: Secondary | ICD-10-CM

## 2023-01-20 DIAGNOSIS — R9431 Abnormal electrocardiogram [ECG] [EKG]: Secondary | ICD-10-CM

## 2023-01-20 LAB — LIPID PANEL
Cholesterol: 223 mg/dL — ABNORMAL HIGH (ref 0–200)
HDL: 66.1 mg/dL (ref >39.00)
LDL Cholesterol: 129 mg/dL — ABNORMAL HIGH (ref 0–99)
NonHDL: 157.23
Total CHOL/HDL Ratio: 3
Triglycerides: 140 mg/dL (ref 0.0–149.0)
VLDL: 28 mg/dL (ref 0.0–40.0)

## 2023-01-20 LAB — COMPREHENSIVE METABOLIC PANEL
ALT: 42 U/L — ABNORMAL HIGH (ref 0–35)
AST: 22 U/L (ref 0–37)
Albumin: 4.3 g/dL (ref 3.5–5.2)
Alkaline Phosphatase: 80 U/L (ref 39–117)
BUN: 21 mg/dL (ref 6–23)
CO2: 28 mEq/L (ref 19–32)
Calcium: 10.8 mg/dL — ABNORMAL HIGH (ref 8.4–10.5)
Chloride: 104 mEq/L (ref 96–112)
Creatinine, Ser: 0.68 mg/dL (ref 0.40–1.20)
GFR: 91.28 mL/min (ref >60.00)
Glucose, Bld: 77 mg/dL (ref 70–99)
Potassium: 3.7 mEq/L (ref 3.5–5.1)
Sodium: 138 mEq/L (ref 135–145)
Total Bilirubin: 0.6 mg/dL (ref 0.2–1.2)
Total Protein: 6.9 g/dL (ref 6.0–8.3)

## 2023-01-20 LAB — CBC
HCT: 43.3 % (ref 36.0–46.0)
Hemoglobin: 15.3 g/dL — ABNORMAL HIGH (ref 12.0–15.0)
MCHC: 35.3 g/dL (ref 30.0–36.0)
MCV: 90.9 fl (ref 78.0–100.0)
Platelets: 224 10*3/uL (ref 150.0–400.0)
RBC: 4.76 Mil/uL (ref 3.87–5.11)
RDW: 13.3 % (ref 11.5–15.5)
WBC: 8.8 10*3/uL (ref 4.0–10.5)

## 2023-01-20 NOTE — Progress Notes (Signed)
Subjective:    Heather Hudson is a 67 y.o. female who presents for a Welcome to Medicare exam.   Review of Systems  Eyes:  Negative for blurred vision.  Respiratory:  Negative for cough.   Cardiovascular:  Negative for chest pain.  Gastrointestinal:  Negative for constipation and diarrhea.  Genitourinary:  Negative for frequency.  Musculoskeletal:  Negative for joint pain.  Neurological:  Negative for headaches.  Psychiatric/Behavioral:  The patient is not nervous/anxious.            Immunizations: -Tetanus: Completed in 2015 -Shingles: Never completed, no interested  -Pneumonia: Never completed, declines today   Mammogram: Completed in May 2023  Colonoscopy: Completed in 2022, due 2025 Dexa: Never completed   BP Readings from Last 3 Encounters:  01/20/23 138/86  09/17/22 122/84  09/05/22 (!) 142/90      Objective:    Today's Vitals   01/20/23 0758  BP: 138/86  Pulse: (!) 55  Temp: 97.9 F (36.6 C)  TempSrc: Temporal  SpO2: 99%  Weight: 189 lb (85.7 kg)  Height:  (1.702 m)  Body mass index is 29.6 kg/m.  Medications Outpatient Encounter Medications as of 01/20/2023  Medication Sig   losartan (COZAAR) 25 MG tablet Take 1 tablet (25 mg total) by mouth daily. For blood pressure.   predniSONE (DELTASONE) 20 MG tablet PLEASE SEE ATTACHED FOR DETAILED DIRECTIONS   Facility-Administered Encounter Medications as of 01/20/2023  Medication   0.9 %  sodium chloride infusion     History: Past Medical History:  Diagnosis Date   Adenomatous colon polyp 2015   Allergy 1974   teenager, and 2008   Colovesical fistula 2015   COVID-19 06/2020   Environmental and seasonal allergies    Hx of adenomatous colonic polyps 01/19/2021   Hypertension 11/23   Low dose prescription   Lower abdominal pain 12/23/2019   Migraines    Motion sickness    Persistent cough for 3 weeks or longer 10/10/2021   Past Surgical History:  Procedure Laterality Date   COLON  SURGERY  2016   fistula, small section removed   COLONOSCOPY  2015   Plus others   LAPAROSCOPIC LEFT COLON RESECTION  2015   Colovesical fistula, [place]   Left peroneal tendon repair Left 2023   WISDOM TOOTH EXTRACTION      Family History  Problem Relation Age of Onset   Heart disease Mother    Diabetes Mother    Hearing loss Mother    Heart disease Father    Prostate cancer Father    Alcohol abuse Father    Cancer Father    COPD Father    Malignant hyperthermia Sister    Pancreatic cancer Maternal Aunt    Diabetes Brother    Cancer Maternal Grandmother    Miscarriages / Stillbirths Maternal Grandmother    Diabetes Brother    Intellectual disability Brother    Learning disabilities Brother    Vision loss Brother    Miscarriages / Stillbirths Sister    Miscarriages / [place] Sister    Colon cancer Neg Hx    Esophageal cancer Neg Hx    Stomach cancer Neg Hx    Liver disease Neg Hx    Breast cancer Neg Hx    Social History   Occupational History   Not on file  Tobacco Use   Smoking status: Never   Smokeless tobacco: Never   Tobacco comments:    Never smoked  Vaping Use  Vaping Use: Never used  Substance and Sexual Activity   Alcohol use: Yes    Alcohol/week: 5.0 standard drinks of alcohol    Types: 5 Glasses of wine per week   Drug use: Never   Sexual activity: Yes    Birth control/protection: Post-menopausal    Tobacco Counseling Counseling given: Not Answered Tobacco comments: Never smoked   Immunizations and Health Maintenance Immunization History  Administered Date(s) Administered   Influenza,inj,Quad PF,6+ Mos 09/07/2018   Influenza-Unspecified 07/20/2016, 09/07/2018   Td 10/06/2013   There are no preventive care reminders to display for this patient.   Activities of Daily Living    01/20/2023    7:57 AM  In your present state of health, do you have any difficulty performing the following activities:  Hearing? 0  Vision? 0   Difficulty concentrating or making decisions? 0  Walking or climbing stairs? 0  Dressing or bathing? 0  Doing errands, shopping? 0    Physical Exam HENT:     Right Ear: Tympanic membrane and ear canal normal.     Left Ear: Tympanic membrane and ear canal normal.     Nose: Nose normal.  Eyes:     Conjunctiva/sclera: Conjunctivae normal.     Pupils: Pupils are equal, round, and reactive to light.  Neck:     Thyroid: No thyromegaly.  Cardiovascular:     Rate and Rhythm: Regular rhythm. Bradycardia present.     Heart sounds: No murmur heard. Pulmonary:     Effort: Pulmonary effort is normal.     Breath sounds: Normal breath sounds. No rales.  Abdominal:     General: Bowel sounds are normal.     Palpations: Abdomen is soft.     Tenderness: There is no abdominal tenderness.  Musculoskeletal:        General: Normal range of motion.     Cervical back: Neck supple.  Lymphadenopathy:     Cervical: No cervical adenopathy.  Skin:    General: Skin is warm and dry.     Findings: No rash.  Neurological:     Mental Status: She is alert and oriented to person, place, and time.     Cranial Nerves: No cranial nerve deficit.     Deep Tendon Reflexes: Reflexes are normal and symmetric.  Psychiatric:        Mood and Affect: Mood normal.     Advanced Directives: Completed, is working to get them notarized.        ECG: Sinus bradycardia with rate of 51. No PAC/PVC. No acute ST changes. No old ECG to compare. Will have cardiology review.  Assessment:    This is a routine wellness examination for this patient .   Vision/Hearing screen Hearing Screening   500Hz  1000Hz  2000Hz  4000Hz   Right ear Fail Fail 20 25  Left ear 40 Fail 25 40   Vision Screening   Right eye Left eye Both eyes  Without correction 20/13 20/15 20/13   With correction       Dietary issues and exercise activities discussed:  Healthy diet and regular exercise.      Goals   None    Depression Screen     01/20/2023    7:57 AM 01/16/2022    8:33 AM 10/10/2021    8:42 AM 02/14/2021    8:28 AM  PHQ 2/9 Scores  PHQ - 2 Score 0 0 0 0  PHQ- 9 Score  0 0 0     Fall Risk  01/20/2023    7:57 AM  Fall Risk   Falls in the past year? 0  Number falls in past yr: 0  Injury with Fall? 0  Risk for fall due to : No Fall Risks  Follow up Falls evaluation completed    Cognitive Function:        01/20/2023    7:58 AM  6CIT Screen  What Year? 0 points  What month? 0 points  What time? 0 points  Count back from 20 0 points  Months in reverse 0 points  Repeat phrase 0 points  Total Score 0 points    Patient Care Team: [name], NP as PCP - General (Internal Medicine)     Plan:     I have personally reviewed and noted the following in the patient's chart:   Medical and social history Use of alcohol, tobacco or illicit drugs  Current medications and supplements Functional ability and status Nutritional status Physical activity Advanced directives List of other physicians Hospitalizations, surgeries, and ER visits in previous 12 months Vitals Screenings to include cognitive, depression, and falls Referrals and appointments  In addition, I have reviewed and discussed with patient certain preventive protocols, quality metrics, and best practice recommendations. A written personalized care plan for preventive services as well as general preventive health recommendations were provided to patient.     [name], NP 01/20/2023

## 2023-01-20 NOTE — Assessment & Plan Note (Signed)
Following with oral surgery in [facility]. Continue prednisone taper.

## 2023-01-20 NOTE — Assessment & Plan Note (Signed)
Overall controlled.  Continue losartan 25 mg daily. CMP pending.

## 2023-01-20 NOTE — Patient Instructions (Signed)
Stop by the lab prior to leaving today. I will notify you of your results once received.   [facility] to schedule your mammogram and bone density scan.   It was a pleasure to see you today!   

## 2023-01-20 NOTE — Assessment & Plan Note (Signed)
Repeat lipid panel pending.  Discussed the importance of a healthy diet and regular exercise in order for weight loss, and to reduce the risk of further co-morbidity.  

## 2023-01-20 NOTE — Assessment & Plan Note (Addendum)
Mammogram due next month, orders placed.  Bone density scan due, orders placed. Colonoscopy UTD, due 2025 Declines Pneumonia vaccine.  Discussed Shingrix.   I have personally reviewed and noted the following in the patient's chart:   Medical and social history Use of alcohol, tobacco or illicit drugs  Current medications and supplements Functional ability and status Nutritional status Physical activity Advanced directives List of other physicians Hospitalizations, surgeries, and ER visits in previous 12 months Vitals Screenings to include cognitive, depression, and falls Referrals and appointments  In addition, I have reviewed and discussed with patient certain preventive protocols, quality metrics, and best practice recommendations. A written personalized care plan for preventive services as well as general preventive health recommendations were provided to patient.

## 2023-01-21 ENCOUNTER — Other Ambulatory Visit: Payer: Self-pay | Admitting: Primary Care

## 2023-01-21 DIAGNOSIS — I10 Essential (primary) hypertension: Secondary | ICD-10-CM

## 2023-02-27 ENCOUNTER — Ambulatory Visit
Admission: RE | Admit: 2023-02-27 | Discharge: 2023-02-27 | Disposition: A | Discharge status: Home / Self Care | Payer: PPO | Source: Ambulatory Visit | Attending: Primary Care | Admitting: Primary Care

## 2023-02-27 DIAGNOSIS — Z1231 Encounter for screening mammogram for malignant neoplasm of breast: Secondary | ICD-10-CM

## 2023-03-03 DIAGNOSIS — Z87898 Personal history of other specified conditions: Secondary | ICD-10-CM

## 2023-03-04 MED ORDER — SCOPOLAMINE 1 MG/3DAYS TD PT72
1.0000 | MEDICATED_PATCH | TRANSDERMAL | 0 refills | Status: DC
Start: 2023-03-04 — End: 2023-05-22

## 2023-03-04 NOTE — Telephone Encounter (Signed)

## 2023-03-16 ENCOUNTER — Encounter: Payer: Self-pay | Admitting: Cardiovascular Disease

## 2023-03-16 NOTE — Progress Notes (Unsigned)
Cardiology Office Note:    Date:  03/17/2023   ID:  Barrie Folk, DOB 1956-07-19, MRN [id number]  PCP:  [name], NP    [facility]Care Providers Cardiologist:   Click to update primary MD,subspecialty MD or APP then REFRESH:1}    Referring MD: [name], NP   Chief Complaint  Patient presents with   Abnormal ECG    History of Present Illness:    Heather Hudson is a 67 y.o. female with a hx of abnormal ECG We are asked to see her for further evaluation of her abn. ECG from 01/20/23 revealed sinus brady at 51 with NS ST abnl   Seen with her husband , Rob .  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  No syncope   Lipids from April 16 were reviewed LDL is 129 Trigs :  140   Fam hx  Father - CABG in his 40s Mother -  Brother - died at 23 - had DM, PAD, CAD   Going to [place] next week    Originally from [place]        Past Medical History:  Diagnosis Date   Adenomatous colon polyp 2015   Allergy 1974   teenager, and 2008   Colovesical fistula 2015   COVID-19 06/2020   Environmental and seasonal allergies    Hx of adenomatous colonic polyps 01/19/2021   Hypertension 11/23   Low dose prescription   Lower abdominal pain 12/23/2019   Migraines    Motion sickness    Persistent cough for 3 weeks or longer 10/10/2021    Past Surgical History:  Procedure Laterality Date   COLON SURGERY  2016   fistula, small section removed   COLONOSCOPY  2015   Plus others   LAPAROSCOPIC LEFT COLON RESECTION  2015   Colovesical fistula, [place]   Left peroneal tendon repair Left 2023   WISDOM TOOTH EXTRACTION      Current Medications: Current Meds  Medication Sig   losartan (COZAAR) 25 MG tablet TAKE 1 TABLET BY MOUTH EVERY DAY FOR  BLOOD PRESSURE   predniSONE (DELTASONE) 20 MG tablet Take 100 mg by mouth daily in the afternoon.   scopolamine (TRANSDERM-SCOP) 1 MG/3DAYS Place 1 patch (1.5 mg total) onto the skin every 3 (three) days.   Current Facility-Administered Medications for the 03/17/23 encounter (Office Visit) with , Deloris Ping, MD  Medication   0.9 %  sodium chloride infusion     Allergies:   Aspirin, Ibuprofen, Other, and Propofol   Social History   Socioeconomic History   Marital status: Married    Spouse name: Not on file   Number of children: Not on file   Years of education: Not on file   Highest education level: Not on file  Occupational History   Not on file  Tobacco Use   Smoking status: Never   Smokeless tobacco: Never   Tobacco comments:    Never smoked  Vaping Use   Vaping Use: Never used  Substance and Sexual Activity   Alcohol use: Yes    Alcohol/week: 5.0 standard drinks of alcohol    Types: 5 Glasses of wine per week   Drug use: Never   Sexual activity: Yes    Birth control/protection:  Post-menopausal  Other Topics Concern   Not on file  Social History Narrative   Married.  No children.   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   Enjoys wine tasting.    1-2 caffeinated drinks a day 0-1 alcoholic never smoker no drug use   Sister Mardene Celeste works Adult nurse endoscopy as Charity fundraiser   Social Determinants of Corporate investment banker Strain: Not on [employer] Insecurity: Not on file  Transportation Needs: Not on file  Physical Activity: Not on file  Stress: Not on file  Social Connections: Not on file     Family History: The patient's family history includes Alcohol abuse in her father; COPD in her father; Cancer in her father and maternal grandmother; Diabetes in her brother, brother, and mother; Hearing loss  in her mother; Heart disease in her father and mother; Intellectual disability in her brother; Learning disabilities in her brother; Malignant hyperthermia in her sister; Miscarriages / Stillbirths in her maternal grandmother, sister, and sister; Pancreatic cancer in her maternal aunt; Prostate cancer in her father; Vision loss in her brother. There is no history of Colon cancer, Esophageal cancer, Stomach cancer, Liver disease, or Breast cancer.  ROS:   Please see the history of present illness.     All other systems reviewed and are negative.  EKGs/Labs/Other Studies Reviewed:    The following studies were reviewed today:   EKG:  January 20, 2023:   sinus brady at 27.  NS ST abn in the lateral leads   Recent Labs: 01/20/2023: ALT 42; BUN 21; Creatinine, Ser 0.68; Hemoglobin 15.3; Platelets 224.0; Potassium 3.7; Sodium 138  Recent Lipid Panel    Component Value Date/Time   CHOL 223 (H) 01/20/2023 0830   TRIG 140.0 01/20/2023 0830   HDL 66.10 01/20/2023 0830   CHOLHDL 3 01/20/2023 0830   VLDL 28.0 01/20/2023 0830   LDLCALC 129 (H) 01/20/2023 0830     Risk Assessment/Calculations:      HYPERTENSION CONTROL Vitals:   03/17/23 0756 03/17/23 0816  BP: (!) 148/94 (!) 144/95    The patient's blood pressure is elevated above target today.  In order to address the patient's elevated BP: Blood pressure will be monitored at home to determine if medication changes need to be made.            Physical Exam:    VS:  BP (!) 144/95   Pulse (!) 57   Ht 5\' 8"  (1.727 m)   Wt 186 lb 12.8 oz (84.7 kg)   SpO2 98%   BMI 28.40 kg/m     Wt Readings from Last 3 Encounters:  03/17/23 186 lb 12.8 oz (84.7 kg)  01/20/23 189 lb (85.7 kg)  09/17/22 186 lb (84.4 kg)     GEN:  Well nourished, well developed in no acute distress HEENT: Normal NECK: No JVD; No carotid bruits LYMPHATICS: No lymphadenopathy CARDIAC: RRR, no murmurs, rubs, gallops RESPIRATORY:  Clear to auscultation without  rales, wheezing or rhonchi  ABDOMEN: Soft, non-tender, non-distended MUSCULOSKELETAL:  No edema; No deformity  SKIN: Warm and dry NEUROLOGIC:  Alert and oriented x 3 PSYCHIATRIC:  Normal affect   ASSESSMENT:    1. Dyspnea on exertion   2. Mixed hyperlipidemia    PLAN:    In order of problems listed  above:  DOE:   she has not been exercising so she almost certainly is deconditioned.  She does have some nonspecific ST and T wave abnormalities.  Will get an echocardiogram and coronary calcium score to start the evaluation.  If she has significant coronary artery calcification then we may need to consider additional testing.  I have asked her to start a regular exercise program.  If she is not able to advance in the her exercise then we may need to consider coronary CT angiogram.  Will get an echocardiogram for further evaluation of her LV function.  2.  Hyperlipidemia:   LDL is 129.   Will get a CAC score for further evaluation .   3.  HTN:   is watching her salt. Working on weight loss  Will see her in 3 months             Medication Adjustments/Labs and Tests Ordered: Current medicines are reviewed at length with the patient today.  Concerns regarding medicines are outlined above.  Orders Placed This Encounter  Procedures   CT CARDIAC SCORING (SELF PAY ONLY)   ECHOCARDIOGRAM COMPLETE   No orders of the defined types were placed in this encounter.   Patient Instructions  Medication Instructions:  Your physician recommends that you continue on your current medications as directed. Please refer to the Current Medication list given to you today.  *If you need a refill on your cardiac medications before your next appointment, please call your pharmacy*   Lab Work: NONE If you have labs (blood work) drawn today and your tests are completely normal, you will receive your results only by: MyChart Message (if you have MyChart) OR A paper copy in the mail If you have any  lab test that is abnormal or we need to change your treatment, we will call you to review the results.  Testing/Procedures: ECHO Your physician has requested that you have an echocardiogram. Echocardiography is a painless test that uses sound waves to create images of your heart. It provides your doctor with information about the size and shape of your heart and how well your heart's chambers and valves are working. This procedure takes approximately one hour. There are no restrictions for this procedure. Please do NOT wear cologne, perfume, aftershave, or lotions (deodorant is allowed). Please arrive 15 minutes prior to your appointment time.  Coronary Calcium Score CT Your physician has requested that you have cardiac CT. Cardiac computed tomography (CT) is a painless test that uses an x-ray machine to take clear, detailed pictures of your heart. For further information please visit [email or web address] Please follow instruction sheet as given.  Follow-Up: At [facility], you and your health needs are our priority.  As part of our continuing mission to provide you with exceptional heart care, we have created designated Provider Care Teams.  These Care Teams include your primary Cardiologist (physician) and Advanced Practice Providers (APPs -  Physician Assistants and Nurse Practitioners) who all work together to provide you with the care you need, when you need it.  Your next appointment:   3 month(s)  Provider:   Kristeen Miss, MD     Signed, Kristeen Miss, MD  03/17/2023 8:41 AM    [facility]

## 2023-03-17 ENCOUNTER — Ambulatory Visit: Payer: PPO | Attending: Cardiovascular Disease | Admitting: Cardiovascular Disease

## 2023-03-17 ENCOUNTER — Encounter: Payer: Self-pay | Admitting: Cardiovascular Disease

## 2023-03-17 VITALS — BP 144/95 | HR 57 | Ht 68.0 in | Wt 186.8 lb

## 2023-03-17 DIAGNOSIS — E782 Mixed hyperlipidemia: Secondary | ICD-10-CM | POA: Diagnosis not present

## 2023-03-17 DIAGNOSIS — R0609 Other forms of dyspnea: Secondary | ICD-10-CM

## 2023-03-17 NOTE — Patient Instructions (Signed)
Medication Instructions:  Your physician recommends that you continue on your current medications as directed. Please refer to the Current Medication list given to you today.  *If you need a refill on your cardiac medications before your next appointment, please call your pharmacy*   Lab Work: NONE If you have labs (blood work) drawn today and your tests are completely normal, you will receive your results only by: MyChart Message (if you have MyChart) OR A paper copy in the mail If you have any lab test that is abnormal or we need to change your treatment, we will call you to review the results.  Testing/Procedures: ECHO Your physician has requested that you have an echocardiogram. Echocardiography is a painless test that uses sound waves to create images of your heart. It provides your doctor with information about the size and shape of your heart and how well your heart's chambers and valves are working. This procedure takes approximately one hour. There are no restrictions for this procedure. Please do NOT wear cologne, perfume, aftershave, or lotions (deodorant is allowed). Please arrive 15 minutes prior to your appointment time.  Coronary Calcium Score CT Your physician has requested that you have cardiac CT. Cardiac computed tomography (CT) is a painless test that uses an x-ray machine to take clear, detailed pictures of your heart. For further information please visit [email or web address] Please follow instruction sheet as given.  Follow-Up: At [facility], you and your health needs are our priority.  As part of our continuing mission to provide you with exceptional heart care, we have created designated Provider Care Teams.  These Care Teams include your primary Cardiologist (physician) and Advanced Practice Providers (APPs -  Physician Assistants and Nurse Practitioners) who all work together to provide you with the care you need, when you need it.  Your next appointment:    3 month(s)  Provider:   Kristeen Miss, MD

## 2023-04-20 ENCOUNTER — Ambulatory Visit (HOSPITAL_BASED_OUTPATIENT_CLINIC_OR_DEPARTMENT_OTHER)
Admission: RE | Admit: 2023-04-20 | Discharge: 2023-04-20 | Disposition: A | Discharge status: Home / Self Care | Payer: PPO | Source: Ambulatory Visit | Attending: Cardiovascular Disease | Admitting: Cardiovascular Disease

## 2023-04-20 ENCOUNTER — Ambulatory Visit (HOSPITAL_BASED_OUTPATIENT_CLINIC_OR_DEPARTMENT_OTHER): Payer: PPO

## 2023-04-20 ENCOUNTER — Encounter (HOSPITAL_BASED_OUTPATIENT_CLINIC_OR_DEPARTMENT_OTHER): Payer: Self-pay

## 2023-04-20 DIAGNOSIS — E782 Mixed hyperlipidemia: Secondary | ICD-10-CM

## 2023-04-20 DIAGNOSIS — R0609 Other forms of dyspnea: Secondary | ICD-10-CM | POA: Diagnosis not present

## 2023-04-20 LAB — ECHOCARDIOGRAM COMPLETE
AR max vel: 3.65 cm2
AV Area VTI: 3.84 cm2
AV Area mean vel: 3.9 cm2
AV Mean grad: 5 mmHg
AV Peak grad: 10.8 mmHg
Ao pk vel: 1.64 m/s
Area-P 1/2: 2.8 cm2
P 1/2 time: 486 msec

## 2023-05-05 ENCOUNTER — Ambulatory Visit (INDEPENDENT_AMBULATORY_CARE_PROVIDER_SITE_OTHER)
Admission: RE | Admit: 2023-05-05 | Discharge: 2023-05-05 | Disposition: A | Discharge status: Home / Self Care | Payer: PPO | Source: Ambulatory Visit | Attending: Primary Care | Admitting: Primary Care

## 2023-05-05 ENCOUNTER — Ambulatory Visit (INDEPENDENT_AMBULATORY_CARE_PROVIDER_SITE_OTHER): Payer: PPO | Admitting: Primary Care

## 2023-05-05 ENCOUNTER — Encounter: Payer: Self-pay | Admitting: Primary Care

## 2023-05-05 VITALS — BP 128/84 | HR 94 | Temp 97.9°F | Ht 68.0 in | Wt 179.0 lb

## 2023-05-05 DIAGNOSIS — R29898 Other symptoms and signs involving the musculoskeletal system: Secondary | ICD-10-CM

## 2023-05-05 DIAGNOSIS — R0609 Other forms of dyspnea: Secondary | ICD-10-CM

## 2023-05-05 DIAGNOSIS — G8929 Other chronic pain: Secondary | ICD-10-CM | POA: Diagnosis not present

## 2023-05-05 DIAGNOSIS — M2550 Pain in unspecified joint: Secondary | ICD-10-CM

## 2023-05-05 LAB — TSH: TSH: 2.38 u[IU]/mL (ref 0.35–5.50)

## 2023-05-05 NOTE — Assessment & Plan Note (Addendum)
Could be secondary to weaning off of prednisone.  Checking labs today to rule out autoimmune arthritis.  Labs pending for CCP and rheumatoid factor.

## 2023-05-05 NOTE — Progress Notes (Signed)
Subjective:    Patient ID: Heather Hudson, female    DOB: 1956-05-15, 67 y.o.   MRN: [id number]  HPI  Heather Hudson is a very pleasant 67 y.o. female with a history of hypertension, environmental and seasonal allergies, hyperlipidemia mucous membrane pemphigoid who presents today to discuss fatigue.  Her husband joins [place] today.   Evaluated by cardiology on 03/17/2023 for abnormal EKG with sinus bradycardia, exertional dyspnea without chest pain. She underwent echocardiogram on 04/20/2023 which revealed LV function with EF of 70 to 75%, grade 1 diastolic dysfunction, right ventricular function mildly reduced, trivial mitral regurgitation.  She also underwent cardiac CT scoring which was 0, low risk.  She was noted to have mild atherosclerosis of her aorta. Exertional dyspnea was thought to be secondary to deconditioning due to lack of physical activity.  She underwent lab testing per dermatology for chronic mucous membrane pemphigoid on 04/23/2023 including CBC, hepatitis panel, tuberculosis panel, liver enzymes.  Platelet count was 138 which is slightly less than her norm.  The plan was to start methotrexate.   Today she discussed generalized weakness and fatigue which began about 1-2 months ago.  She cannot stand for prolonged periods of time due to joint aches and weakness to her knees and ankles. She experiences exertional dyspnea with short distances such as walking in her home, walking from the parking lot to the lobby in our office. She has to use her arms to help push herself out of a chair due to lower extremity weakness. Three mornings ago she began to experience shortness of breath with sitting and talking. Also with post nasal drip for the last week with thick mucus to the throat.   Currently managed on prednisone for which she began in April 2024 for mucous membrane pemphigoid, began tapering down in June, then increased the dose again, now is on a much lower dose.   She denies a history  of respiratory disorder, smoking, tick bites, nausea, abdominal pain, unexplained weight loss, rectal bleeding. Her husband mentions that snores lightly on occasion. She is sleeping well at night.  Her appetite is good.  BP Readings from Last 3 Encounters:  05/05/23 128/84  03/17/23 (!) 144/95  01/20/23 138/86      Review of Systems  Constitutional:  Positive for fatigue.  Respiratory:  Positive for shortness of breath.   Cardiovascular:  Negative for chest pain.         Past Medical History:  Diagnosis Date   Adenomatous colon polyp 2015   Allergy 1974   teenager, and 2008   Colovesical fistula 2015   COVID-19 06/2020   Environmental and seasonal allergies    Hx of adenomatous colonic polyps 01/19/2021   Hypertension 11/23   Low dose prescription   Lower abdominal pain 12/23/2019   Migraines    Motion sickness    Persistent cough for 3 weeks or longer 10/10/2021    Social History   Socioeconomic History   Marital status: Married    Spouse name: Not on file   Number of children: Not on file   Years of education: Not on file   Highest education level: Associate degree: occupational, Scientist, product/process development, or vocational program  Occupational History   Not on file  Tobacco Use   Smoking status: Never   Smokeless tobacco: Never   Tobacco comments:    Never smoked  Vaping Use   Vaping status: Never Used  Substance and Sexual Activity   Alcohol use: Yes    Alcohol/week:  5.0 standard drinks of alcohol    Types: 5 Glasses of wine per week   Drug use: Never   Sexual activity: Yes    Birth control/protection: Post-menopausal  Other Topics Concern   Not on file  Social History Narrative   Married.  No children.   Moved from [place] area of [place] in 2018    Worked in [employer], Insurance account manager.  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   Enjoys wine tasting.    1-2 caffeinated drinks a  day 0-1 alcoholic never smoker no drug use   Sister Mardene Celeste works Adult nurse endoscopy as Charity fundraiser   Social Determinants of Corporate investment banker Strain: Low Risk  (05/04/2023)   Overall Financial Resource Strain (CARDIA)    Difficulty of Paying Living Expenses: Not hard at all  Food Insecurity: No Food Insecurity (05/04/2023)   Hunger Vital Sign    Worried About Running Out of Food in the Last Year: Never true    Ran Out of Food in the Last Year: Never true  Transportation Needs: No Transportation Needs (05/04/2023)   PRAPARE - Administrator, Civil Service (Medical): No    Lack of Transportation (Non-Medical): No  Physical Activity: Unknown (05/04/2023)   Exercise Vital Sign    Days of Exercise per Week: 0 days    Minutes of Exercise per Session: Not on file  Stress: No Stress Concern Present (05/04/2023)   [employer] of Occupational Health - Occupational Stress Questionnaire    Feeling of Stress : Not at all  Social Connections: Socially Integrated (05/04/2023)   Social Connection and Isolation Panel [NHANES]    Frequency of Communication with Friends and Family: More than three times a week    Frequency of Social Gatherings with Friends and Family: More than three times a week    Attends Religious Services: More than 4 times per year    Active Member of [employer] or Organizations: Yes    Attends Engineer, structural: More than 4 times per year    Marital Status: Married  Catering manager Violence: Unknown (12/22/2022)   Received from [employer], [facility]   HITS    Physically Hurt: Not on file    Insult or Talk Down To: Not on file    Threaten Physical Harm: Not on file    Scream or Curse: Not on file    Past Surgical History:  Procedure Laterality Date   COLON SURGERY  2016   fistula, small section removed   COLONOSCOPY  2015   Plus others   LAPAROSCOPIC LEFT COLON RESECTION  2015   Colovesical fistula, [place]   Left peroneal tendon repair Left  2023   WISDOM TOOTH EXTRACTION      Family History  Problem Relation Age of Onset   Heart disease Mother    Diabetes Mother    Hearing loss Mother    Heart disease Father    Prostate cancer Father    Alcohol abuse Father    Cancer Father    COPD Father    Malignant hyperthermia Sister    Pancreatic cancer Maternal Aunt    Diabetes Brother    Cancer Maternal Grandmother    Miscarriages / Stillbirths Maternal Grandmother    Diabetes Brother    Intellectual disability Brother    Learning disabilities Brother    Vision loss Brother    Miscarriages / [place]  Sister    Miscarriages / [place] Sister    Colon cancer Neg Hx    Esophageal cancer Neg Hx    Stomach cancer Neg Hx    Liver disease Neg Hx    Breast cancer Neg Hx     Allergies  Allergen Reactions   Aspirin Hives   Ibuprofen Hives   Other     Anesthesia (familial) SISTER HAD MALIGNANT HYPERTHERMIA   Propofol Other (See Comments)    Current Outpatient Medications on File Prior to Visit  Medication Sig Dispense Refill   losartan (COZAAR) 25 MG tablet TAKE 1 TABLET BY MOUTH EVERY DAY FOR BLOOD PRESSURE 90 tablet 3   methotrexate (RHEUMATREX) 2.5 MG tablet Take 2.5 mg by mouth.     predniSONE (DELTASONE) 10 MG tablet Take 25 mg by mouth daily in the afternoon.     scopolamine (TRANSDERM-SCOP) 1 MG/3DAYS Place 1 patch (1.5 mg total) onto the skin every 3 (three) days. (Patient not taking: Reported on 05/05/2023) 10 patch 0   Current Facility-Administered Medications on File Prior to Visit  Medication Dose Route Frequency Provider Last Rate Last Admin   0.9 %  sodium chloride infusion  500 mL Intravenous Once Iva Boop, MD        BP 128/84   Pulse 94   Temp 97.9 F (36.6 C) (Temporal)   Ht 5\' 8"  (1.727 m)   Wt 179 lb (81.2 kg)   SpO2 97%   BMI 27.22 kg/m  Objective:   Physical Exam Cardiovascular:     Rate and Rhythm: Normal rate and regular rhythm.  Pulmonary:     Effort: Pulmonary effort is  normal.     Breath sounds: Normal breath sounds.  Musculoskeletal:     Cervical back: Neck supple.     Comments: 5 out of 5 strength to bilateral lower extremities on exam.  Skin:    General: Skin is warm and dry.  Neurological:     Mental Status: She is alert.           Assessment & Plan:  Weakness of both lower extremities Assessment & Plan: Could be secondary to weaning off of prednisone, will rule out other causes.  Labs pending to rule out myasthenia gravis. Also checking autoimmune labs for rheumatoid arthritis.  Await results.  Orders: -     TSH -     ANA -     Rheumatoid factor -     Cyclic citrul peptide antibody, IgG -     MuSK Antibodies -     AChR Abs with Reflex to MuSK  Exertional dyspnea Assessment & Plan: Unclear etiology.  Reviewed cardiology notes and workup from June and July 2024. Noncardiac cause.  Checking labs today including TSH and autoimmune labs. Reviewed labs from dermatology through care everywhere from July 2024.Marland Kitchen  Chest x-ray ordered and pending.  Orders: -     TSH -     ANA -     Rheumatoid factor -     Cyclic citrul peptide antibody, IgG -     MuSK Antibodies -     AChR Abs with Reflex to MuSK -     DG Chest 2 View  Chronic joint pain Assessment & Plan: Could be secondary to weaning off of prednisone.  Checking labs today to rule out autoimmune arthritis.  Labs pending for CCP and rheumatoid factor.  Orders: -     Rheumatoid factor -     Cyclic citrul peptide antibody, IgG  [name], NP

## 2023-05-05 NOTE — Assessment & Plan Note (Signed)
Could be secondary to weaning off of prednisone, will rule out other causes.  Labs pending to rule out myasthenia gravis. Also checking autoimmune labs for rheumatoid arthritis.  Await results.

## 2023-05-05 NOTE — Assessment & Plan Note (Addendum)
Unclear etiology.  Reviewed cardiology notes and workup from June and July 2024. Noncardiac cause.  Checking labs today including TSH and autoimmune labs. Reviewed labs from dermatology through care everywhere from July 2024.Marland Kitchen  Chest x-ray ordered and pending.

## 2023-05-05 NOTE — Patient Instructions (Signed)
Stop by the lab and xray prior to leaving today. I will notify you of your results once received.   It was a pleasure to see you today!   

## 2023-05-14 ENCOUNTER — Telehealth: Payer: PPO | Admitting: Physician Assistant

## 2023-05-14 ENCOUNTER — Emergency Department (HOSPITAL_BASED_OUTPATIENT_CLINIC_OR_DEPARTMENT_OTHER): Payer: PPO | Admitting: Radiology

## 2023-05-14 ENCOUNTER — Emergency Department (HOSPITAL_BASED_OUTPATIENT_CLINIC_OR_DEPARTMENT_OTHER): Payer: PPO

## 2023-05-14 ENCOUNTER — Other Ambulatory Visit: Payer: Self-pay

## 2023-05-14 ENCOUNTER — Inpatient Hospital Stay (HOSPITAL_BASED_OUTPATIENT_CLINIC_OR_DEPARTMENT_OTHER)
Admission: EM | Admit: 2023-05-14 | Discharge: 2023-05-22 | DRG: 981 | Disposition: A | Discharge status: Home / Self Care | Payer: PPO | Attending: Internal Medicine | Admitting: Internal Medicine

## 2023-05-14 ENCOUNTER — Encounter (HOSPITAL_BASED_OUTPATIENT_CLINIC_OR_DEPARTMENT_OTHER): Payer: Self-pay

## 2023-05-14 DIAGNOSIS — Z886 Allergy status to analgesic agent status: Secondary | ICD-10-CM

## 2023-05-14 DIAGNOSIS — I2609 Other pulmonary embolism with acute cor pulmonale: Secondary | ICD-10-CM | POA: Diagnosis present

## 2023-05-14 DIAGNOSIS — I824Z2 Acute embolism and thrombosis of unspecified deep veins of left distal lower extremity: Secondary | ICD-10-CM | POA: Diagnosis present

## 2023-05-14 DIAGNOSIS — Z810 Family history of intellectual disabilities: Secondary | ICD-10-CM

## 2023-05-14 DIAGNOSIS — Z1152 Encounter for screening for COVID-19: Secondary | ICD-10-CM | POA: Diagnosis not present

## 2023-05-14 DIAGNOSIS — R0609 Other forms of dyspnea: Secondary | ICD-10-CM | POA: Diagnosis not present

## 2023-05-14 DIAGNOSIS — E669 Obesity, unspecified: Secondary | ICD-10-CM | POA: Diagnosis present

## 2023-05-14 DIAGNOSIS — I10 Essential (primary) hypertension: Secondary | ICD-10-CM | POA: Diagnosis present

## 2023-05-14 DIAGNOSIS — I480 Paroxysmal atrial fibrillation: Secondary | ICD-10-CM | POA: Diagnosis present

## 2023-05-14 DIAGNOSIS — I82452 Acute embolism and thrombosis of left peroneal vein: Secondary | ICD-10-CM | POA: Diagnosis present

## 2023-05-14 DIAGNOSIS — Z7952 Long term (current) use of systemic steroids: Secondary | ICD-10-CM

## 2023-05-14 DIAGNOSIS — I82422 Acute embolism and thrombosis of left iliac vein: Secondary | ICD-10-CM | POA: Diagnosis present

## 2023-05-14 DIAGNOSIS — L129 Pemphigoid, unspecified: Secondary | ICD-10-CM | POA: Diagnosis present

## 2023-05-14 DIAGNOSIS — I871 Compression of vein: Secondary | ICD-10-CM | POA: Diagnosis present

## 2023-05-14 DIAGNOSIS — I2602 Saddle embolus of pulmonary artery with acute cor pulmonale: Secondary | ICD-10-CM | POA: Diagnosis not present

## 2023-05-14 DIAGNOSIS — R0602 Shortness of breath: Secondary | ICD-10-CM | POA: Diagnosis not present

## 2023-05-14 DIAGNOSIS — I82442 Acute embolism and thrombosis of left tibial vein: Secondary | ICD-10-CM | POA: Diagnosis present

## 2023-05-14 DIAGNOSIS — E872 Acidosis, unspecified: Secondary | ICD-10-CM | POA: Diagnosis present

## 2023-05-14 DIAGNOSIS — Z6827 Body mass index (BMI) 27.0-27.9, adult: Secondary | ICD-10-CM

## 2023-05-14 DIAGNOSIS — L121 Cicatricial pemphigoid: Secondary | ICD-10-CM | POA: Diagnosis present

## 2023-05-14 DIAGNOSIS — I2699 Other pulmonary embolism without acute cor pulmonale: Secondary | ICD-10-CM

## 2023-05-14 DIAGNOSIS — Z832 Family history of diseases of the blood and blood-forming organs and certain disorders involving the immune mechanism: Secondary | ICD-10-CM

## 2023-05-14 DIAGNOSIS — R5382 Chronic fatigue, unspecified: Secondary | ICD-10-CM

## 2023-05-14 DIAGNOSIS — Z8616 Personal history of COVID-19: Secondary | ICD-10-CM

## 2023-05-14 DIAGNOSIS — E876 Hypokalemia: Secondary | ICD-10-CM | POA: Diagnosis not present

## 2023-05-14 DIAGNOSIS — E871 Hypo-osmolality and hyponatremia: Secondary | ICD-10-CM | POA: Diagnosis present

## 2023-05-14 DIAGNOSIS — I82432 Acute embolism and thrombosis of left popliteal vein: Secondary | ICD-10-CM | POA: Diagnosis present

## 2023-05-14 DIAGNOSIS — J9601 Acute respiratory failure with hypoxia: Secondary | ICD-10-CM | POA: Diagnosis present

## 2023-05-14 DIAGNOSIS — Z888 Allergy status to other drugs, medicaments and biological substances status: Secondary | ICD-10-CM

## 2023-05-14 DIAGNOSIS — Z825 Family history of asthma and other chronic lower respiratory diseases: Secondary | ICD-10-CM

## 2023-05-14 DIAGNOSIS — Z8249 Family history of ischemic heart disease and other diseases of the circulatory system: Secondary | ICD-10-CM

## 2023-05-14 DIAGNOSIS — R739 Hyperglycemia, unspecified: Secondary | ICD-10-CM

## 2023-05-14 DIAGNOSIS — Z822 Family history of deafness and hearing loss: Secondary | ICD-10-CM

## 2023-05-14 DIAGNOSIS — Z79899 Other long term (current) drug therapy: Secondary | ICD-10-CM

## 2023-05-14 DIAGNOSIS — T380X5A Adverse effect of glucocorticoids and synthetic analogues, initial encounter: Secondary | ICD-10-CM | POA: Diagnosis present

## 2023-05-14 DIAGNOSIS — D649 Anemia, unspecified: Secondary | ICD-10-CM | POA: Diagnosis present

## 2023-05-14 DIAGNOSIS — Z833 Family history of diabetes mellitus: Secondary | ICD-10-CM

## 2023-05-14 DIAGNOSIS — R29898 Other symptoms and signs involving the musculoskeletal system: Secondary | ICD-10-CM

## 2023-05-14 DIAGNOSIS — M7989 Other specified soft tissue disorders: Secondary | ICD-10-CM | POA: Diagnosis not present

## 2023-05-14 DIAGNOSIS — I82412 Acute embolism and thrombosis of left femoral vein: Secondary | ICD-10-CM | POA: Diagnosis present

## 2023-05-14 DIAGNOSIS — I2601 Septic pulmonary embolism with acute cor pulmonale: Secondary | ICD-10-CM | POA: Diagnosis not present

## 2023-05-14 DIAGNOSIS — Z821 Family history of blindness and visual loss: Secondary | ICD-10-CM

## 2023-05-14 HISTORY — DX: Other pulmonary embolism without acute cor pulmonale: I26.99

## 2023-05-14 LAB — PROTIME-INR
INR: 1.1 (ref 0.8–1.2)
Prothrombin Time: 14.2 seconds (ref 11.4–15.2)

## 2023-05-14 LAB — TROPONIN I (HIGH SENSITIVITY)
Troponin I (High Sensitivity): 15 ng/L (ref <18)
Troponin I (High Sensitivity): 13 ng/L (ref <18)

## 2023-05-14 LAB — CBC WITH DIFFERENTIAL/PLATELET
Abs Immature Granulocytes: 0.23 10*3/uL — ABNORMAL HIGH (ref 0.00–0.07)
Basophils Absolute: 0 10*3/uL (ref 0.0–0.1)
Basophils Relative: 0 %
Eosinophils Absolute: 0 10*3/uL (ref 0.0–0.5)
Eosinophils Relative: 0 %
HCT: 33 % — ABNORMAL LOW (ref 36.0–46.0)
Hemoglobin: 11.7 g/dL — ABNORMAL LOW (ref 12.0–15.0)
Immature Granulocytes: 2 %
Lymphocytes Relative: 5 %
Lymphs Abs: 0.7 10*3/uL (ref 0.7–4.0)
MCH: 33.4 pg (ref 26.0–34.0)
MCHC: 35.5 g/dL (ref 30.0–36.0)
MCV: 94.3 fL (ref 80.0–100.0)
Monocytes Absolute: 0.5 10*3/uL (ref 0.1–1.0)
Monocytes Relative: 4 %
Neutro Abs: 11.7 10*3/uL — ABNORMAL HIGH (ref 1.7–7.7)
Neutrophils Relative %: 89 %
Platelets: 188 10*3/uL (ref 150–400)
RBC: 3.5 MIL/uL — ABNORMAL LOW (ref 3.87–5.11)
RDW: 15.7 % — ABNORMAL HIGH (ref 11.5–15.5)
WBC: 13.1 10*3/uL — ABNORMAL HIGH (ref 4.0–10.5)
nRBC: 0.4 % — ABNORMAL HIGH (ref 0.0–0.2)

## 2023-05-14 LAB — COMPREHENSIVE METABOLIC PANEL
ALT: 42 U/L (ref 0–44)
AST: 26 U/L (ref 15–41)
Albumin: 3.7 g/dL (ref 3.5–5.0)
Alkaline Phosphatase: 74 U/L (ref 38–126)
Anion gap: 9 (ref 5–15)
BUN: 33 mg/dL — ABNORMAL HIGH (ref 8–23)
CO2: 21 mmol/L — ABNORMAL LOW (ref 22–32)
Calcium: 10.4 mg/dL — ABNORMAL HIGH (ref 8.9–10.3)
Chloride: 100 mmol/L (ref 98–111)
Creatinine, Ser: 0.69 mg/dL (ref 0.44–1.00)
GFR, Estimated: >60 mL/min (ref >60)
Glucose, Bld: 407 mg/dL — ABNORMAL HIGH (ref 70–99)
Potassium: 4.5 mmol/L (ref 3.5–5.1)
Sodium: 130 mmol/L — ABNORMAL LOW (ref 135–145)
Total Bilirubin: 1.5 mg/dL — ABNORMAL HIGH (ref 0.3–1.2)
Total Protein: 6.5 g/dL (ref 6.5–8.1)

## 2023-05-14 LAB — RESP PANEL BY RT-PCR (RSV, FLU A&B, COVID)  RVPGX2
Influenza A by PCR: NEGATIVE
Influenza B by PCR: NEGATIVE
Resp Syncytial Virus by PCR: NEGATIVE
SARS Coronavirus 2 by RT PCR: NEGATIVE

## 2023-05-14 LAB — LACTIC ACID, PLASMA: Lactic Acid, Venous: 1.6 mmol/L (ref 0.5–1.9)

## 2023-05-14 LAB — APTT: aPTT: 32 seconds (ref 24–36)

## 2023-05-14 LAB — BRAIN NATRIURETIC PEPTIDE: B Natriuretic Peptide: 37.4 pg/mL (ref 0.0–100.0)

## 2023-05-14 MED ORDER — LOSARTAN POTASSIUM 25 MG PO TABS
25.0000 mg | ORAL_TABLET | Freq: Every day | ORAL | Status: DC
  Administered 2023-05-15 – 2023-05-22 (×8): 25 mg via ORAL
  Filled 2023-05-14 (×8): qty 1

## 2023-05-14 MED ORDER — METHOTREXATE SODIUM 2.5 MG PO TABS
10.0000 mg | ORAL_TABLET | ORAL | Status: DC
  Administered 2023-05-17: 10 mg via ORAL
  Filled 2023-05-14: qty 4

## 2023-05-14 MED ORDER — POLYETHYLENE GLYCOL 3350 17 G PO PACK: 17.0000 g | PACK | Freq: Every day | ORAL | Status: DC | PRN

## 2023-05-14 MED ORDER — SODIUM CHLORIDE 0.9% FLUSH
3.0000 mL | Freq: Two times a day (BID) | INTRAVENOUS | Status: DC
  Administered 2023-05-15 – 2023-05-22 (×8): 3 mL via INTRAVENOUS

## 2023-05-14 MED ORDER — SODIUM CHLORIDE 0.9 % IV BOLUS
1000.0000 mL | Freq: Once | INTRAVENOUS | Status: AC
  Administered 2023-05-14: 1000 mL via INTRAVENOUS

## 2023-05-14 MED ORDER — HEPARIN BOLUS VIA INFUSION
5000.0000 [IU] | Freq: Once | INTRAVENOUS | Status: AC
  Administered 2023-05-14: 5000 [IU] via INTRAVENOUS

## 2023-05-14 MED ORDER — ACETAMINOPHEN 650 MG RE SUPP: 650.0000 mg | Freq: Four times a day (QID) | RECTAL | Status: DC | PRN

## 2023-05-14 MED ORDER — ACETAMINOPHEN 325 MG PO TABS
650.0000 mg | ORAL_TABLET | Freq: Four times a day (QID) | ORAL | Status: DC | PRN
  Administered 2023-05-16 – 2023-05-20 (×3): 650 mg via ORAL
  Filled 2023-05-14 (×3): qty 2

## 2023-05-14 MED ORDER — FOLIC ACID 1 MG PO TABS
1.0000 mg | ORAL_TABLET | Freq: Every day | ORAL | Status: DC
  Administered 2023-05-15 – 2023-05-22 (×7): 1 mg via ORAL
  Filled 2023-05-14 (×8): qty 1

## 2023-05-14 MED ORDER — HEPARIN (PORCINE) 25000 UT/250ML-% IV SOLN
1500.0000 [IU]/h | INTRAVENOUS | Status: DC
  Administered 2023-05-14 – 2023-05-17 (×4): 1300 [IU]/h via INTRAVENOUS
  Administered 2023-05-18: 1400 [IU]/h via INTRAVENOUS
  Administered 2023-05-18 – 2023-05-19 (×2): 1500 [IU]/h via INTRAVENOUS
  Filled 2023-05-14 (×7): qty 250

## 2023-05-14 MED ORDER — IOHEXOL 350 MG/ML SOLN
100.0000 mL | Freq: Once | INTRAVENOUS | Status: AC | PRN
  Administered 2023-05-14: 75 mL via INTRAVENOUS

## 2023-05-14 MED ORDER — INSULIN ASPART 100 UNIT/ML IJ SOLN
0.0000 [IU] | Freq: Every day | INTRAMUSCULAR | Status: DC
  Administered 2023-05-15: 3 [IU] via SUBCUTANEOUS
  Administered 2023-05-15 – 2023-05-16 (×2): 2 [IU] via SUBCUTANEOUS

## 2023-05-14 MED ORDER — PREDNISONE 20 MG PO TABS
20.0000 mg | ORAL_TABLET | Freq: Every day | ORAL | Status: DC
  Administered 2023-05-15 – 2023-05-22 (×8): 20 mg via ORAL
  Filled 2023-05-14 (×8): qty 1

## 2023-05-14 MED ORDER — ORAL CARE MOUTH RINSE: 15.0000 mL | OROMUCOSAL | Status: DC | PRN

## 2023-05-14 MED ORDER — INSULIN ASPART 100 UNIT/ML IJ SOLN
0.0000 [IU] | Freq: Three times a day (TID) | INTRAMUSCULAR | Status: DC
  Administered 2023-05-15: 11 [IU] via SUBCUTANEOUS
  Administered 2023-05-15: 3 [IU] via SUBCUTANEOUS
  Administered 2023-05-15: 8 [IU] via SUBCUTANEOUS
  Administered 2023-05-16: 3 [IU] via SUBCUTANEOUS
  Administered 2023-05-16: 2 [IU] via SUBCUTANEOUS
  Administered 2023-05-16: 8 [IU] via SUBCUTANEOUS
  Administered 2023-05-17: 3 [IU] via SUBCUTANEOUS
  Administered 2023-05-17: 2 [IU] via SUBCUTANEOUS
  Administered 2023-05-17: 3 [IU] via SUBCUTANEOUS
  Administered 2023-05-18: 2 [IU] via SUBCUTANEOUS
  Administered 2023-05-20 (×2): 3 [IU] via SUBCUTANEOUS
  Administered 2023-05-21: 5 [IU] via SUBCUTANEOUS
  Administered 2023-05-21: 8 [IU] via SUBCUTANEOUS
  Administered 2023-05-22: 3 [IU] via SUBCUTANEOUS

## 2023-05-14 MED ORDER — SODIUM CHLORIDE 0.9 % IV SOLN
INTRAVENOUS | Status: DC
  Administered 2023-05-18: 75 mL/h via INTRAVENOUS

## 2023-05-14 NOTE — Progress Notes (Signed)
Hospitalist Transfer Note:    Nursing staff, Please call [facility] Admits & Consults System-Wide number on Amion [phone]) as soon as patient's arrival, so appropriate admitting provider can evaluate the pt.   Transferring facility: [facility] Requesting provider: Dr. Tanda Rockers (EDP at [facility]) Reason for transfer: admission for further evaluation and management of acute bilateral pulmonary emboli with CTA evidence of right heart strain.    67  year old female who presented to [facility] ED complaining of  6 weeks of progressive dyspnea on exertion, which started shortly after returning from a trip from [place].  Not associate any reported chest pain.  CTA chest showed evidence of bilateral pulmonary emboli, segmental in nature, with suggestion of right middle lobe infarct and CTA evidence of right heart strain.  Troponin nonelevated.  Vital signs in the ED were notable for the following: No evidence of hypotension, with systolic blood pressures in the 120s to 140s.  No evidence of hypoxia, with patient maintaining oxygen saturations in the range of 93 to 97% on room air.  Afebrile.  [facility] EDP d/w on-call PCCM, Dr. Francine Graven who recommended [facility] admission, Heparin drip, lactic acid level to evaluation degree of perfusion, but did not think that thrombectomy was warranted at this time; Dr. Francine Graven conveyed that PCCM will consult and see patient upon arrival at [facility].   Medications administered prior to transfer included the following: Initiation of heparin drip.   Subsequently, I accepted this patient for transfer for inpatient admission to a pcu bed at [facility] for further work-up and management of the above.      Newton Pigg, DO Hospitalist

## 2023-05-14 NOTE — ED Notes (Signed)
Report given to the next RN... 

## 2023-05-14 NOTE — Assessment & Plan Note (Addendum)
Very mild, incidental.  I will check PTH, vitamin D Phos morning. Noted on April 16th as well.

## 2023-05-14 NOTE — Telephone Encounter (Signed)
Was able to connect with pt, had to call Molly Maduro, husband's phone.  Pt reported that she was getting ready to go to UC at [facility] to be seen.  Reports that she has had weakness that started mid June that has progressively gotten worse.  Today she is also having dyspnea.

## 2023-05-14 NOTE — Patient Instructions (Signed)
  Barrie Folk, thank you for joining [name], PA-C for today's virtual visit.  While this provider is not your primary care provider (PCP), if your PCP is located in our provider database this encounter information will be shared with them immediately following your visit.   A [facility] MyChart account gives you access to today's visit and all your visits, tests, and labs performed at [facility] " click here if you don't have a [facility] MyChart account or go to [email or web address]  Consent: (Patient) Heather Hudson provided verbal consent for this virtual visit at the beginning of the encounter.  Current Medications:  Current Outpatient Medications:    losartan (COZAAR) 25 MG tablet, TAKE 1 TABLET BY MOUTH EVERY DAY FOR BLOOD PRESSURE, Disp: 90 tablet, Rfl: 3   methotrexate (RHEUMATREX) 2.5 MG tablet, Take 2.5 mg by mouth., Disp: , Rfl:    predniSONE (DELTASONE) 10 MG tablet, Take 25 mg by mouth daily in the afternoon., Disp: , Rfl:    scopolamine (TRANSDERM-SCOP) 1 MG/3DAYS, Place 1 patch (1.5 mg total) onto the skin every 3 (three) days. (Patient not taking: Reported on 05/05/2023), Disp: 10 patch, Rfl: 0  Current Facility-Administered Medications:    0.9 %  sodium chloride infusion, 500 mL, Intravenous, Once, Iva Boop, MD   Medications ordered in this encounter:  No orders of the defined types were placed in this encounter.    *If you need refills on other medications prior to your next appointment, please contact your pharmacy*  Follow-Up: Call back or seek an in-person evaluation if the symptoms worsen or if the condition fails to improve as anticipated.  [facility] [phone]  Other Instructions Seek evaluation at closest ER for progressive weakness, fatigue and dyspnea    If you have been instructed to have an in-person evaluation today at a local Urgent Care facility, please use the link below. It will take you to a  list of all of our available [facility], including address, phone number and hours of operation. Please do not delay care.  [facility]  If you or a family member do not have a primary care provider, use the link below to schedule a visit and establish care. When you choose a [facility] primary care physician or advanced practice provider, you gain a long-term partner in health. Find a Primary Care Provider  Learn more about [facility] in-office and virtual care options: [facility] - Get Care Now

## 2023-05-14 NOTE — Telephone Encounter (Signed)
LMTCB

## 2023-05-14 NOTE — Progress Notes (Signed)
ANTICOAGULATION CONSULT NOTE - Initial Consult  Pharmacy Consult for Heparin Indication: pulmonary embolus  Allergies  Allergen Reactions   Aspirin Hives   Ibuprofen Hives   Other     Anesthesia (familial) SISTER HAD MALIGNANT HYPERTHERMIA   Propofol Other (See Comments)    Patient Measurements: Height: 5\' 8"  (172.7 cm) Weight: 81.2 kg (179 lb 0.2 oz) IBW/kg (Calculated) : 63.9 Heparin Dosing Weight: 80.3 kg  Vital Signs: Temp: 98 F (36.7 C) (08/08 1625) BP: 122/77 (08/08 1830) Pulse Rate: 96 (08/08 1830)  Labs: Recent Labs    05/14/23 1749  HGB 11.7*  HCT 33.0*  PLT 188  CREATININE 0.69  TROPONINIHS 15    Estimated Creatinine Clearance: 78.4 mL/min (by C-G formula based on SCr of 0.69 mg/dL).   Medical History: Past Medical History:  Diagnosis Date   Adenomatous colon polyp 2015   Allergy 1974   teenager, and 2008   Colovesical fistula 2015   COVID-19 06/2020   Environmental and seasonal allergies    Hx of adenomatous colonic polyps 01/19/2021   Hypertension 11/23   Low dose prescription   Lower abdominal pain 12/23/2019   Migraines    Motion sickness    Persistent cough for 3 weeks or longer 10/10/2021    Medications:  Scheduled:   heparin  5,000 Units Intravenous Once   Infusions:   sodium chloride     heparin     sodium chloride     PRN:   Assessment: 67 yo female presents with dyspnea, fatigue. CTa shows bilateral PE with RHS (RV/LV Ratio = 1.05) consistent with at least submassive. Pharmacy consulted to dose IV heparin. No anticoagulants documented PTA. Hgb 11.7 (15.3 in April), Plts WNL. SCr 0.69.  Goal of Therapy:  Heparin level 0.3-0.7 units/ml Monitor platelets by anticoagulation protocol: Yes   Plan:  Give 5000 units bolus x 1 Start heparin infusion at 1300 units/hr Check anti-Xa level in 6 hours and daily while on heparin Continue to monitor H&H and platelets  Loralee Pacas, PharmD, BCPS 05/14/2023,7:38 PM  Please  check AMION for all [facility] Pharmacy phone numbers After 10:00 PM, call Main Pharmacy [phone]

## 2023-05-14 NOTE — ED Provider Notes (Signed)
[facility] 4E CV SURGICAL PROGRESSIVE CARE Provider Note  CSN: [id number] Arrival date & time: 05/14/23 1614  Chief Complaint(s) Shortness of Breath and Fatigue  HPI Heather Hudson is a 67 y.o. female with past medical history as below, significant for HTN, migraines, colovesical fistula who presents to the ED with complaint of dyspnea, fatigue. Ongoing since late June after returned from [place]. She reports worsened exertional dyspnea, now unable to ambulate more than 15 ft or so without becoming severely winded and has to sit down and rest. Not a/w cp, no fevers or chills. Does report some joint pain to her knees, no LE swelling. Cough x2 wks, intermittently productive. Was recently started on steroids by pcp. Had ankle surgery in September.  No hx lung disease, no hx dvt/pe. No cp or diarrhea. Seen at PCP recently and advised to report to ED for eval  Past Medical History Past Medical History:  Diagnosis Date   Adenomatous colon polyp 2015   Allergy 1974   teenager, and 2008   Colovesical fistula 2015   COVID-19 06/2020   Environmental and seasonal allergies    Hx of adenomatous colonic polyps 01/19/2021   Hypertension 11/23   Low dose prescription   Lower abdominal pain 12/23/2019   Migraines    Motion sickness    Persistent cough for 3 weeks or longer 10/10/2021   Patient Active Problem List   Diagnosis Date Noted   Acute pulmonary embolism (HCC) 05/14/2023   Pulmonary embolism (HCC) 05/14/2023   Weakness of both lower extremities 05/05/2023   Exertional dyspnea 05/05/2023   Chronic joint pain 05/05/2023   Welcome to Medicare preventive visit 01/20/2023   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 Authorizing Provider  losartan (COZAAR) 25 MG tablet TAKE 1 TABLET BY MOUTH EVERY DAY FOR BLOOD PRESSURE 01/21/23   [name], NP  methotrexate (RHEUMATREX) 2.5 MG tablet Take 2.5 mg by mouth. 04/28/23   [provider]  predniSONE (DELTASONE) 10 MG tablet Take 25 mg by mouth daily in the afternoon. 01/14/23   [provider]  scopolamine (TRANSDERM-SCOP) 1 MG/3DAYS Place 1 patch (1.5 mg total) onto the skin every 3 (three) days. Patient not taking: Reported on 05/05/2023 03/04/23   [name], NP                                                                                                                                    Past Surgical History Past Surgical History:  Procedure Laterality Date   COLON SURGERY  2016   fistula, small section removed   COLONOSCOPY  2015  Plus others   LAPAROSCOPIC LEFT COLON RESECTION  2015   Colovesical fistula, [place]   Left peroneal tendon repair Left 2023   WISDOM TOOTH EXTRACTION     Family History Family History  Problem Relation Age of Onset   Heart disease Mother    Diabetes Mother    Hearing loss Mother    Heart disease Father    Prostate cancer Father    Alcohol abuse Father    Cancer Father    COPD Father    Malignant hyperthermia Sister    Pancreatic cancer Maternal Aunt    Diabetes Brother    Cancer Maternal Grandmother    Miscarriages / Stillbirths Maternal Grandmother    Diabetes Brother    Intellectual disability Brother    Learning disabilities Brother    Vision loss Brother    Miscarriages / Stillbirths Sister    Miscarriages / [place] Sister    Colon cancer Neg Hx    Esophageal cancer Neg Hx    Stomach cancer Neg Hx    Liver disease Neg Hx    Breast cancer Neg Hx     Social History Social History   Tobacco Use   Smoking status: Never   Smokeless tobacco: Never   Tobacco comments:    Never smoked  Vaping Use   Vaping  status: Never Used  Substance Use Topics   Alcohol use: Yes    Alcohol/week: 5.0 standard drinks of alcohol    Types: 5 Glasses of wine per week   Drug use: Never   Allergies Aspirin, Ibuprofen, Other, and Propofol  Review of Systems Review of Systems  Constitutional:  Positive for appetite change and fatigue. Negative for chills and fever.  HENT:  Negative for facial swelling and trouble swallowing.   Eyes:  Negative for photophobia and visual disturbance.  Respiratory:  Positive for cough and shortness of breath.   Cardiovascular:  Negative for chest pain and palpitations.  Gastrointestinal:  Negative for abdominal pain, nausea and vomiting.  Endocrine: Negative for polydipsia and polyuria.  Genitourinary:  Negative for difficulty urinating and hematuria.  Musculoskeletal:  Positive for arthralgias. Negative for gait problem and joint swelling.  Skin:  Negative for pallor and rash.  Neurological:  Negative for syncope and headaches.  Psychiatric/Behavioral:  Negative for agitation and confusion.     Physical Exam Vital Signs  I have reviewed the triage vital signs BP 121/83   Pulse 90   Temp 98.7 F (37.1 C) (Oral)   Resp (!) 24   Ht 5\' 8"  (1.727 m)   Wt 81.2 kg   SpO2 97%   BMI 27.22 kg/m  Physical Exam Vitals and nursing note reviewed.  Constitutional:      General: She is not in acute distress.    Appearance: Normal appearance. She is well-developed. She is not ill-appearing.  HENT:     Head: Normocephalic and atraumatic.     Right Ear: External ear normal.     Left Ear: External ear normal.     Nose: Nose normal.     Mouth/Throat:     Mouth: Mucous membranes are moist.  Eyes:     General: No scleral icterus.       Right eye: No discharge.        Left eye: No discharge.  Cardiovascular:     Rate and Rhythm: Regular rhythm. Tachycardia present.     Pulses: Normal pulses.     Heart sounds: Normal heart sounds.  Pulmonary:  Effort: Pulmonary effort is  normal. Tachypnea present. No accessory muscle usage or respiratory distress.     Breath sounds: Normal breath sounds. No stridor. No decreased breath sounds or wheezing.  Abdominal:     General: Abdomen is flat. There is no distension.     Palpations: Abdomen is soft.     Tenderness: There is no abdominal tenderness.  Musculoskeletal:     Cervical back: No rigidity.     Right lower leg: No edema.     Left lower leg: No edema.  Skin:    General: Skin is warm and dry.     Capillary Refill: Capillary refill takes less than 2 seconds.  Neurological:     Mental Status: She is alert.  Psychiatric:        Mood and Affect: Mood normal.        Behavior: Behavior normal. Behavior is cooperative.     ED Results and Treatments Labs (all labs ordered are listed, but only abnormal results are displayed) Labs Reviewed  CBC WITH DIFFERENTIAL/PLATELET - Abnormal; Notable for the following components:      Result Value   WBC 13.1 (*)    RBC 3.50 (*)    Hemoglobin 11.7 (*)    HCT 33.0 (*)    RDW 15.7 (*)    nRBC 0.4 (*)    Neutro Abs 11.7 (*)    Abs Immature Granulocytes 0.23 (*)    All other components within normal limits  COMPREHENSIVE METABOLIC PANEL - Abnormal; Notable for the following components:   Sodium 130 (*)    CO2 21 (*)    Glucose, Bld 407 (*)    BUN 33 (*)    Calcium 10.4 (*)    Total Bilirubin 1.5 (*)    All other components within normal limits  RESP PANEL BY RT-PCR (RSV, FLU A&B, COVID)  RVPGX2  BRAIN NATRIURETIC PEPTIDE  APTT  PROTIME-INR  LACTIC ACID, PLASMA  LACTIC ACID, PLASMA  HIV ANTIBODY (ROUTINE TESTING W REFLEX)  APTT  PROTIME-INR  BASIC METABOLIC PANEL  CBC  HEMOGLOBIN A1C  TROPONIN I (HIGH SENSITIVITY)  TROPONIN I (HIGH SENSITIVITY)                                                                                                                          Radiology CT Angio Chest PE W and/or Wo Contrast  Result Date: 05/14/2023 CLINICAL DATA:   Pulmonary embolism (PE) suspected, high prob EXAM: CT ANGIOGRAPHY CHEST WITH CONTRAST TECHNIQUE: Multidetector CT imaging of the chest was performed using the standard protocol during bolus administration of intravenous contrast. Multiplanar CT image reconstructions and MIPs were obtained to evaluate the vascular anatomy. RADIATION DOSE REDUCTION: This exam was performed according to the departmental dose-optimization program which includes automated exposure control, adjustment of the mA and/or kV according to patient size and/or use of iterative reconstruction technique. CONTRAST:  75mL OMNIPAQUE IOHEXOL 350 MG/ML SOLN COMPARISON:  05/05/2023 FINDINGS: Cardiovascular: Satisfactory opacification of the  pulmonary arteries. Extensive bilateral pulmonary emboli involving both main pulmonary arteries, right greater than left. Thrombus involves numerous lobar, segmental, and subsegmental branch pulmonary arteries bilaterally. No saddle embolism. Mildly elevated RV to LV ratio of 1.05. Heart size is normal. No pericardial effusion. Thoracic aorta is nonaneurysmal. Mediastinum/Nodes: No enlarged mediastinal, hilar, or axillary lymph nodes. Thyroid gland, trachea, and esophagus demonstrate no significant findings. Lungs/Pleura: Wedge-shaped airspace consolidation with air bronchograms within the right middle lobe. Additional smaller areas of airspace consolidation within the periphery of the right upper lobe and superior aspect of the right lower lobe. Bibasilar atelectasis. No pleural effusion or pneumothorax. Upper Abdomen: No acute abnormality. Musculoskeletal: No chest wall abnormality. No acute or significant osseous findings. Review of the MIP images confirms the above findings. IMPRESSION: 1. Positive for acute bilateral PE with CT evidence of right heart strain (RV/LV Ratio = 1.05) consistent with at least submassive (intermediate risk) PE. The presence of right heart strain has been associated with an increased  risk of morbidity and mortality. Please refer to the "Code PE Focused" order set in EPIC. 2. Wedge-shaped airspace consolidation within the right middle lobe with additional smaller areas of airspace consolidation within the periphery of the right upper lobe and superior aspect of the right lower lobe. Findings are most concerning for pulmonary infarcts. Multifocal pneumonia could also result in this appearance in the appropriate clinical setting. Critical Value/emergent results were called by telephone at the time of interpretation on 05/14/2023 at 7:10 pm to provider Dr. Tanda Rockers , who verbally acknowledged these results. Electronically Signed   By: Duanne Guess D.O.   On: 05/14/2023 19:10    Pertinent labs & imaging results that were available during my care of the patient were reviewed by me and considered in my medical decision making (see MDM for details).  Medications Ordered in ED Medications  heparin ADULT infusion 100 units/mL (25000 units/239mL) (1,300 Units/hr Intravenous New Bag/Given 05/14/23 1948)  predniSONE (DELTASONE) tablet 20 mg (has no administration in time range)  methotrexate (RHEUMATREX) tablet 10 mg (has no administration in time range)  losartan (COZAAR) tablet 25 mg (has no administration in time range)  folic acid (FOLVITE) tablet 1 mg (has no administration in time range)  0.9 %  sodium chloride infusion (has no administration in time range)  acetaminophen (TYLENOL) tablet 650 mg (has no administration in time range)    Or  acetaminophen (TYLENOL) suppository 650 mg (has no administration in time range)  polyethylene glycol (MIRALAX / GLYCOLAX) packet 17 g (has no administration in time range)  sodium chloride flush (NS) 0.9 % injection 3 mL (has no administration in time range)  insulin aspart (novoLOG) injection 0-15 Units (has no administration in time range)  insulin aspart (novoLOG) injection 0-5 Units (has no administration in time range)  Oral care mouth  rinse (has no administration in time range)  iohexol (OMNIPAQUE) 350 MG/ML injection 100 mL (75 mLs Intravenous Contrast Given 05/14/23 1852)  sodium chloride 0.9 % bolus 1,000 mL (0 mLs Intravenous Stopped 05/14/23 2028)  heparin bolus via infusion 5,000 Units (5,000 Units Intravenous Bolus from Bag 05/14/23 1948)  Procedures .Critical Care  Performed by: Sloan Leiter, DO Authorized by: Sloan Leiter, DO   Critical care provider statement:    Critical care time (minutes):  70   Critical care time was exclusive of:  Separately billable procedures and treating other patients   Critical care was necessary to treat or prevent imminent or life-threatening deterioration of the following conditions:  Circulatory failure   Critical care was time spent personally by me on the following activities:  Development of treatment plan with patient or surrogate, discussions with consultants, evaluation of patient's response to treatment, examination of patient, ordering and review of laboratory studies, ordering and review of radiographic studies, ordering and performing treatments and interventions, pulse oximetry, re-evaluation of patient's condition, review of old charts and obtaining history from patient or surrogate   Care discussed with: admitting provider     (including critical care time)  Medical Decision Making / ED Course    Medical Decision Making:    Courtland Mellin is a 67 y.o. female with past medical history as below, significant for HTN, migraines, colovesical fistula who presents to the ED with complaint of dyspnea, fatigue. . The complaint involves an extensive differential diagnosis and also carries with it a high risk of complications and morbidity.  Serious etiology was considered. Ddx includes but is not limited to: In my evaluation of this patient's dyspnea  my DDx includes, but is not limited to, pneumonia, pulmonary embolism, pneumothorax, pulmonary edema, metabolic acidosis, asthma, COPD, cardiac cause, anemia, anxiety, etc.    Complete initial physical exam performed, notably the patient  was NAD, no hypoxia, tachycardia noted.    Reviewed and confirmed nursing documentation for past medical history, family history, social history.  Vital signs reviewed.    Clinical Course as of 05/14/23 2344  Thu May 14, 2023  1844 Hemoglobin(!): 11.7 Decreased from prior, she denies BRBPR, vaginal bleeding or blood in urine, no increased bruising [SG]  1927 CT with b/l PE with RHS, right sided middle/upper lobe infarcts noted as well. She is on RA at rest. She has mild leukocytosis but was recently on steroids. Also consider stress response in setting of PE. Mild hyponatremia; give IVF. RVP was neg. Hgb is mildly reduced from prior, she denies any source of bleeding, recommend close monitoring. No contraindications of heparin verbalized, agreeable to medication. Will order. Also will consult pulm, plan admission  [SG]  1937 Spoke w/ Dr Francine Graven (PCCM), reviewed imaging, recommend add LA, recommend send to [facility], they will see in consult  [SG]    Clinical Course User Index [SG] Sloan Leiter, DO    Narrative: 67 yo female w/ hx as above Here with exertional dyspnea/fatigue for since late June Progressively worsening Recent travel just prior to onset of symptoms, no hx DVT/PE WBC mild +, recent steroids Hgb mildly reduced Mild hyponatremia, IVF in process Pt found to have b/l PE w/ pulm infarcts 2L [place] for comfort Start heparin Plan admit PCCM on consult Dr Francine Graven                Additional history obtained: -Additional history obtained from spouse -External records from outside source obtained and reviewed including: Chart review including previous notes, labs, imaging, consultation notes including prior echo, pcp documentation, prior labs,  home meds ECHO 7/24 LVEF 70-75%, mildly reduced RV fxn, trivial MR and trivial AI, G1DD per cardiology Dr Elease Hashimoto  Lab Tests: -I ordered, reviewed, and interpreted labs.   The pertinent results include:  Labs Reviewed  CBC WITH DIFFERENTIAL/PLATELET - Abnormal; Notable for the following components:      Result Value   WBC 13.1 (*)    RBC 3.50 (*)    Hemoglobin 11.7 (*)    HCT 33.0 (*)    RDW 15.7 (*)    nRBC 0.4 (*)    Neutro Abs 11.7 (*)    Abs Immature Granulocytes 0.23 (*)    All other components within normal limits  COMPREHENSIVE METABOLIC PANEL - Abnormal; Notable for the following components:   Sodium 130 (*)    CO2 21 (*)    Glucose, Bld 407 (*)    BUN 33 (*)    Calcium 10.4 (*)    Total Bilirubin 1.5 (*)    All other components within normal limits  RESP PANEL BY RT-PCR (RSV, FLU A&B, COVID)  RVPGX2  BRAIN NATRIURETIC PEPTIDE  APTT  PROTIME-INR  LACTIC ACID, PLASMA  LACTIC ACID, PLASMA  HIV ANTIBODY (ROUTINE TESTING W REFLEX)  APTT  PROTIME-INR  BASIC METABOLIC PANEL  CBC  HEMOGLOBIN A1C  TROPONIN I (HIGH SENSITIVITY)  TROPONIN I (HIGH SENSITIVITY)    Notable for wbc +, hgb reduced  EKG   EKG Interpretation Date/Time:  Thursday May 14 2023 16:23:04 EDT Ventricular Rate:  108 PR Interval:  134 QRS Duration:  80 QT Interval:  322 QTC Calculation: 431 R Axis:   67  Text Interpretation: Sinus tachycardia Biatrial enlargement Nonspecific ST and T wave abnormality Abnormal ECG No previous ECGs available no stemi no prior Confirmed by Tanda Rockers ([id number]) on 05/14/2023 5:31:25 PM         Imaging Studies ordered: I ordered imaging studies including CTPE I independently visualized the following imaging with scope of interpretation limited to determining acute life threatening conditions related to emergency care; findings noted above, significant for b/l PE w/ RHS I independently visualized and interpreted imaging. I agree with the radiologist  interpretation   Medicines ordered and prescription drug management: Meds ordered this encounter  Medications   iohexol (OMNIPAQUE) 350 MG/ML injection 100 mL   sodium chloride 0.9 % bolus 1,000 mL   heparin bolus via infusion 5,000 Units   heparin ADULT infusion 100 units/mL (25000 units/248mL)   predniSONE (DELTASONE) tablet 20 mg   methotrexate (RHEUMATREX) tablet 10 mg   losartan (COZAAR) tablet 25 mg   folic acid (FOLVITE) tablet 1 mg   0.9 %  sodium chloride infusion   OR Linked Order Group    acetaminophen (TYLENOL) tablet 650 mg    acetaminophen (TYLENOL) suppository 650 mg   polyethylene glycol (MIRALAX / GLYCOLAX) packet 17 g   sodium chloride flush (NS) 0.9 % injection 3 mL   insulin aspart (novoLOG) injection 0-15 Units    Order Specific Question:   Correction coverage:    Answer:   Moderate (average weight, post-op)    Order Specific Question:   CBG < 70:    Answer:   Implement Hypoglycemia Standing Orders and refer to Hypoglycemia Standing Orders sidebar report    Order Specific Question:   CBG 70 - 120:    Answer:   0 units    Order Specific Question:   CBG 121 - 150:    Answer:   2 units    Order Specific Question:   CBG 151 - 200:    Answer:   3 units    Order Specific Question:   CBG 201 - 250:    Answer:   5 units  Order Specific Question:   CBG 251 - 300:    Answer:   8 units    Order Specific Question:   CBG 301 - 350:    Answer:   11 units    Order Specific Question:   CBG 351 - 400:    Answer:   15 units    Order Specific Question:   CBG > 400    Answer:   call MD and obtain STAT lab verification   insulin aspart (novoLOG) injection 0-5 Units    Order Specific Question:   Correction coverage:    Answer:   HS scale    Order Specific Question:   CBG < 70:    Answer:   Implement Hypoglycemia Standing Orders and refer to Hypoglycemia Standing Orders sidebar report    Order Specific Question:   CBG 70 - 120:    Answer:   0 units    Order Specific  Question:   CBG 121 - 150:    Answer:   0 units    Order Specific Question:   CBG 151 - 200:    Answer:   0 units    Order Specific Question:   CBG 201 - 250:    Answer:   2 units    Order Specific Question:   CBG 251 - 300:    Answer:   3 units    Order Specific Question:   CBG 301 - 350:    Answer:   4 units    Order Specific Question:   CBG 351 - 400:    Answer:   5 units    Order Specific Question:   CBG > 400    Answer:   call MD and obtain STAT lab verification   Oral care mouth rinse    -I have reviewed the patients home medicines and have made adjustments as needed   Consultations Obtained: I requested consultation with the Dr Francine Graven,  and discussed lab and imaging findings as well as pertinent plan    Cardiac Monitoring: The patient was maintained on a cardiac monitor.  I personally viewed and interpreted the cardiac monitored which showed an underlying rhythm of: sinus tachy > NSR  Social Determinants of Health:  Diagnosis or treatment significantly limited by social determinants of health: na   Reevaluation: After the interventions noted above, I reevaluated the patient and found that they have stayed the same  Co morbidities that complicate the patient evaluation  Past Medical History:  Diagnosis Date   Adenomatous colon polyp 2015   Allergy 1974   teenager, and 2008   Colovesical fistula 2015   COVID-19 06/2020   Environmental and seasonal allergies    Hx of adenomatous colonic polyps 01/19/2021   Hypertension 11/23   Low dose prescription   Lower abdominal pain 12/23/2019   Migraines    Motion sickness    Persistent cough for 3 weeks or longer 10/10/2021      Dispostion: Disposition decision including need for hospitalization was considered, and patient admitted to the hospital.    Final Clinical Impression(s) / ED Diagnoses Final diagnoses:  Acute pulmonary embolism with acute cor pulmonale, unspecified pulmonary embolism type (HCC)         Sloan Leiter, DO 05/14/23 2344

## 2023-05-14 NOTE — H&P (Addendum)
History and Physical    Patient: Heather Hudson FAO:[id number] DOB: [birth year] DOA: 05/14/2023 DOS: the patient was seen and examined on 05/14/2023 PCP: [name], NP  Patient coming from: Home>advised to come to ER/hospital by PCP  Chief Complaint:  Chief Complaint  Patient presents with   Shortness of Breath   Fatigue   HPI: Heather Hudson is a 67 y.o. 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  Patient was in her usual state of health till approximately 6 weeks ago when she reports a new onset syndrome of fatigue/shortness of breath that was brought on by exertion such as ambulating 50 to 100 feet.  However since then patient has had a progressive decline in her ability to exert before she has symptoms of marked tachypnea/dyspnea/fatigue.  Today patient was only able to ambulate 15 feet or so before she was markedly disabled by her symptoms.  There is no report of cough, chest pain, palpitation, leg swelling.  Patient has had leg surgery done in 2023 on the left side and patient has some chronic discomfort of the leg since then.  No change in leg symptoms since then.  Patient underwent remote/virtual visit with primary care provider.  Given her marked limitation in ambulation, she was advised to come to the ER.  Patient was initially evaluated at [facility] where a CAT scan revealed bilateral pulmonary emboli with RV to LV ratio more than 1.  Patient is subsequently transferred to [facility] for further evaluation.  Patient has been getting heparin infusion  Patient is asymptomatic at rest in the stretcher Review of Systems: As mentioned in the history of present illness. All other systems reviewed and are negative. Past Medical History:  Diagnosis Date   Adenomatous colon polyp 2015   Allergy 1974   teenager, and 2008   Colovesical fistula 2015   COVID-19 06/2020   Environmental and  seasonal allergies    Hx of adenomatous colonic polyps 01/19/2021   Hypertension 11/23   Low dose prescription   Lower abdominal pain 12/23/2019   Migraines    Motion sickness    Persistent cough for 3 weeks or longer 10/10/2021   Past Surgical History:  Procedure Laterality Date   COLON SURGERY  2016   fistula, small section removed   COLONOSCOPY  2015   Plus others   LAPAROSCOPIC LEFT COLON RESECTION  2015   Colovesical fistula, [place]   Left peroneal tendon repair Left 2023   WISDOM TOOTH EXTRACTION     Social History:  reports that she has never smoked. She has never used smokeless tobacco. She reports current alcohol use of about 5.0 standard drinks of alcohol per week. She reports that she does not use drugs.  Allergies  Allergen Reactions   Aspirin Hives   Ibuprofen Hives   Other     Anesthesia (familial) SISTER HAD MALIGNANT HYPERTHERMIA   Propofol Other (See Comments)    Family History  Problem Relation Age of Onset   Heart disease Mother    Diabetes Mother    Hearing loss Mother    Heart disease Father    Prostate cancer Father    Alcohol abuse Father    Cancer Father    COPD Father    Malignant hyperthermia Sister    Pancreatic cancer Maternal Aunt    Diabetes Brother    Cancer Maternal Grandmother    Miscarriages / Stillbirths Maternal Grandmother    Diabetes  Brother    Intellectual disability Brother    Learning disabilities Brother    Vision loss Brother    Miscarriages / Stillbirths Sister    Miscarriages / [place] Sister    Colon cancer Neg Hx    Esophageal cancer Neg Hx    Stomach cancer Neg Hx    Liver disease Neg Hx    Breast cancer Neg Hx     Prior to Admission medications   Medication Sig Start Date End Date Taking? Authorizing Provider  losartan (COZAAR) 25 MG tablet TAKE 1 TABLET BY MOUTH EVERY DAY FOR BLOOD PRESSURE 01/21/23   [name], NP  methotrexate (RHEUMATREX) 2.5 MG tablet Take 2.5 mg by mouth. 04/28/23    [provider]  predniSONE (DELTASONE) 10 MG tablet Take 25 mg by mouth daily in the afternoon. 01/14/23   [provider]  scopolamine (TRANSDERM-SCOP) 1 MG/3DAYS Place 1 patch (1.5 mg total) onto the skin every 3 (three) days. Patient not taking: Reported on 05/05/2023 03/04/23   [name], NP    Physical Exam: Vitals:   05/14/23 2052 05/14/23 2100 05/14/23 2122 05/14/23 2205  BP:  121/83    Pulse:  94  90  Resp:  (!) 29  (!) 24  Temp: 98.7 F (37.1 C)     TempSrc: Oral     SpO2:  95% 94% 97%  Weight:      Height:       Central: Patient is alert and awake gives coherent account of her symptoms no distress is apparent Patient is on 2 L/min of supplementary oxygen that was started just prior to patient being transferred to [facility] when her pulse oximetry was noted to be 90% on room air Respiratory; bilateral intravesically Cardiovascular exam S1-S2 normal Abdomen soft nontender Extremities warm without edema Skin no rash Data Reviewed:  Labs on Admission:  Results for orders placed or performed during the hospital encounter of 05/14/23 (from the past 24 hour(s))  Brain natriuretic peptide     Status: None   Collection Time: 05/14/23  5:30 PM  Result Value Ref Range   B Natriuretic Peptide 37.4 0.0 - 100.0 pg/mL  CBC with Differential     Status: Abnormal   Collection Time: 05/14/23  5:49 PM  Result Value Ref Range   WBC 13.1 (H) 4.0 - 10.5 K/uL   RBC 3.50 (L) 3.87 - 5.11 MIL/uL   Hemoglobin 11.7 (L) 12.0 - 15.0 g/dL   HCT 16.1 (L) 09.6 - 04.5 %   MCV 94.3 80.0 - 100.0 fL   MCH 33.4 26.0 - 34.0 pg   MCHC 35.5 30.0 - 36.0 g/dL   RDW 40.9 (H) 81.1 - 91.4 %   Platelets 188 150 - 400 K/uL   nRBC 0.4 (H) 0.0 - 0.2 %   Neutrophils Relative % 89 %   Neutro Abs 11.7 (H) 1.7 - 7.7 K/uL   Lymphocytes Relative 5 %   Lymphs Abs 0.7 0.7 - 4.0 K/uL   Monocytes Relative 4 %   Monocytes Absolute 0.5 0.1 - 1.0 K/uL   Eosinophils Relative 0 %    Eosinophils Absolute 0.0 0.0 - 0.5 K/uL   Basophils Relative 0 %   Basophils Absolute 0.0 0.0 - 0.1 K/uL   Immature Granulocytes 2 %   Abs Immature Granulocytes 0.23 (H) 0.00 - 0.07 K/uL  Comprehensive metabolic panel     Status: Abnormal   Collection Time: 05/14/23  5:49 PM  Result Value Ref Range  Sodium 130 (L) 135 - 145 mmol/L   Potassium 4.5 3.5 - 5.1 mmol/L   Chloride 100 98 - 111 mmol/L   CO2 21 (L) 22 - 32 mmol/L   Glucose, Bld 407 (H) 70 - 99 mg/dL   BUN 33 (H) 8 - 23 mg/dL   Creatinine, Ser 0.45 0.44 - 1.00 mg/dL   Calcium 40.9 (H) 8.9 - 10.3 mg/dL   Total Protein 6.5 6.5 - 8.1 g/dL   Albumin 3.7 3.5 - 5.0 g/dL   AST 26 15 - 41 U/L   ALT 42 0 - 44 U/L   Alkaline Phosphatase 74 38 - 126 U/L   Total Bilirubin 1.5 (H) 0.3 - 1.2 mg/dL   GFR, Estimated >81 >19 mL/min   Anion gap 9 5 - 15  Troponin I (High Sensitivity)     Status: None   Collection Time: 05/14/23  5:49 PM  Result Value Ref Range   Troponin I (High Sensitivity) 15 <18 ng/L  Resp panel by RT-PCR (RSV, Flu A&B, Covid) Anterior Nasal Swab     Status: None   Collection Time: 05/14/23  5:50 PM   Specimen: Anterior Nasal Swab  Result Value Ref Range   SARS Coronavirus 2 by RT PCR NEGATIVE NEGATIVE   Influenza A by PCR NEGATIVE NEGATIVE   Influenza B by PCR NEGATIVE NEGATIVE   Resp Syncytial Virus by PCR NEGATIVE NEGATIVE  APTT     Status: None   Collection Time: 05/14/23  7:30 PM  Result Value Ref Range   aPTT 32 24 - 36 seconds  Protime-INR     Status: None   Collection Time: 05/14/23  7:30 PM  Result Value Ref Range   Prothrombin Time 14.2 11.4 - 15.2 seconds   INR 1.1 0.8 - 1.2  Troponin I (High Sensitivity)     Status: None   Collection Time: 05/14/23  7:30 PM  Result Value Ref Range   Troponin I (High Sensitivity) 13 <18 ng/L  Lactic acid, plasma     Status: None   Collection Time: 05/14/23  7:39 PM  Result Value Ref Range   Lactic Acid, Venous 1.6 0.5 - 1.9 mmol/L   Basic Metabolic  Panel: Recent Labs  Lab 05/14/23 1749  NA 130*  K 4.5  CL 100  CO2 21*  GLUCOSE 407*  BUN 33*  CREATININE 0.69  CALCIUM 10.4*   Liver Function Tests: Recent Labs  Lab 05/14/23 1749  AST 26  ALT 42  ALKPHOS 74  BILITOT 1.5*  PROT 6.5  ALBUMIN 3.7   No results for input(s): "LIPASE", "AMYLASE" in the last 168 hours. No results for input(s): "AMMONIA" in the last 168 hours. CBC: Recent Labs  Lab 05/14/23 1749  WBC 13.1*  NEUTROABS 11.7*  HGB 11.7*  HCT 33.0*  MCV 94.3  PLT 188   Cardiac Enzymes: Recent Labs  Lab 05/14/23 1749 05/14/23 1930  TROPONINIHS 15 13    BNP (last 3 results) No results for input(s): "PROBNP" in the last 8760 hours. CBG: No results for input(s): "GLUCAP" in the last 168 hours.  Radiological Exams on Admission:  CT Angio Chest PE W and/or Wo Contrast  Result Date: 05/14/2023 CLINICAL DATA:  Pulmonary embolism (PE) suspected, high prob EXAM: CT ANGIOGRAPHY CHEST WITH CONTRAST TECHNIQUE: Multidetector CT imaging of the chest was performed using the standard protocol during bolus administration of intravenous contrast. Multiplanar CT image reconstructions and MIPs were obtained to evaluate the vascular anatomy. RADIATION DOSE REDUCTION: This exam  was performed according to the departmental dose-optimization program which includes automated exposure control, adjustment of the mA and/or kV according to patient size and/or use of iterative reconstruction technique. CONTRAST:  75mL OMNIPAQUE IOHEXOL 350 MG/ML SOLN COMPARISON:  05/05/2023 FINDINGS: Cardiovascular: Satisfactory opacification of the pulmonary arteries. Extensive bilateral pulmonary emboli involving both main pulmonary arteries, right greater than left. Thrombus involves numerous lobar, segmental, and subsegmental branch pulmonary arteries bilaterally. No saddle embolism. Mildly elevated RV to LV ratio of 1.05. Heart size is normal. No pericardial effusion. Thoracic aorta is nonaneurysmal.  Mediastinum/Nodes: No enlarged mediastinal, hilar, or axillary lymph nodes. Thyroid gland, trachea, and esophagus demonstrate no significant findings. Lungs/Pleura: Wedge-shaped airspace consolidation with air bronchograms within the right middle lobe. Additional smaller areas of airspace consolidation within the periphery of the right upper lobe and superior aspect of the right lower lobe. Bibasilar atelectasis. No pleural effusion or pneumothorax. Upper Abdomen: No acute abnormality. Musculoskeletal: No chest wall abnormality. No acute or significant osseous findings. Review of the MIP images confirms the above findings. IMPRESSION: 1. Positive for acute bilateral PE with CT evidence of right heart strain (RV/LV Ratio = 1.05) consistent with at least submassive (intermediate risk) PE. The presence of right heart strain has been associated with an increased risk of morbidity and mortality. Please refer to the "Code PE Focused" order set in EPIC. 2. Wedge-shaped airspace consolidation within the right middle lobe with additional smaller areas of airspace consolidation within the periphery of the right upper lobe and superior aspect of the right lower lobe. Findings are most concerning for pulmonary infarcts. Multifocal pneumonia could also result in this appearance in the appropriate clinical setting. Critical Value/emergent results were called by telephone at the time of interpretation on 05/14/2023 at 7:10 pm to provider Dr. Tanda Rockers , who verbally acknowledged these results. Electronically Signed   By: Duanne Guess D.O.   On: 05/14/2023 19:10    EKG: Independently reviewed. Sinus tachycardia.   Assessment and Plan: * Acute pulmonary embolism (HCC) Without any hemodynamic instability.  However associated pulmonary infarcts as well as associated borderline questionable hypoxemia.  Patient being treated with supplementary oxygen with plan to check orthostatic in the morning as well as ambulatory pulse  oximetry.  Patient has been started on heparin infusion, we will monitor patient's clinical evolution on this.  I advised patient to report any new symptoms including but not limited to headache, new back pain any neurologic symptoms on the lower extremity such as tingling or weakness or bladder or bowel changes or any black liquid stool to the staff right away. Check lower extremity dopplers.  Patient is noted to have leukocytosis, sinus tachycardia, and consolidation on CAT scan.  This is likely due to concern infarction of the lung.  However patient is immunocompromise getting steroid therapy as well as methotrexate.  At this time therefore I will check blood cultures and start empiric treatment for pneumonia  Hypercalcemia Very mild, incidental.  I will check PTH, vitamin D Phos morning. Noted on April 16th as well.  Hyperglycemia This is likely been precipitated by patient being started on steroid therapy recently.  See below.  I will maintain the patient on insulin sliding scale at this time.  Mucous membrane pemphigoid [name], MD - 04/23/2023 8:45 AM EDT Formatting of this note is different from the original. Images from the original note were not included. Dermatology Note  Assessment and Plan:  Mucous membrane pemphigoid, chronic illness with continued disease activity: -Patient has oral  biopsy with immunofluorescence demonstrating mucous membrane pemphigoid (reviewed in EPIC). She has been on high doses of prednisone and is experiencing side effects from prednisone including muscle weakness, facial plethora; continued disease activity, but overall improved from baseline. -Diagnosis, treatment options, prognosis, risk/ benefit, and side effects were discussed with the patient - Pending labs below, will start 10 mg methotrexate with folic acid supplementation - START prednisone taper, recommended PPI, calcium, and vitamin D while on steroids. Will hold off on antibiotic  prophylaxis at this time given tapering and on lower dose - START predniSONE (DELTASONE) 10 MG tablet; Take 3 tablets (30 mg total) by mouth daily for 7 days, THEN 2.5 tablets (25 mg total) daily for 7 days, THEN 2 tablets (20 mg total) daily for 7 days, THEN 1.5 tablets (15 mg total) daily for 14 days. Dispense: 74 tablet; Refill: 0 - START clobetasol (TEMOVATE) 0.05 % Gel; Apply to affected areas of the mouth twice daily while flaring. Dispense: 30 g; Refill: 0   Primary hypertension Blood pressure is acceptable, continue with losartan      Advance Care Planning:   Code Status: Full Code   Consults: PCCM has been engaged by ER provider.  Family Communication: husband at bedside during this encounter.  Severity of Illness: The appropriate patient status for this patient is INPATIENT. Inpatient status is judged to be reasonable and necessary in order to provide the required intensity of service to ensure the patient's safety. The patient's presenting symptoms, physical exam findings, and initial radiographic and laboratory data in the context of their chronic comorbidities is felt to place them at high risk for further clinical deterioration. Furthermore, it is not anticipated that the patient will be medically stable for discharge from the hospital within 2 midnights of admission.   * I certify that at the point of admission it is my clinical judgment that the patient will require inpatient hospital care spanning beyond 2 midnights from the point of admission due to high intensity of service, high risk for further deterioration and high frequency of surveillance required.*  Author: Nolberto Hanlon, MD 05/14/2023 11:54 PM  For on call review [email or web address].

## 2023-05-14 NOTE — Assessment & Plan Note (Signed)
This is likely been precipitated by patient being started on steroid therapy recently.  See below.  I will maintain the patient on insulin sliding scale at this time.

## 2023-05-14 NOTE — Consult Note (Signed)
NAME:  Joshelyn Zarcone, MRN:  [id number], DOB:  [birth year], LOS: 0 ADMISSION DATE:  05/14/2023, CONSULTATION DATE:  05/14/23 REFERRING MD:  Tanda Rockers, DO CHIEF COMPLAINT:  Pulmonary Emboli   History of Present Illness:  Heather Hudson is a 67 year old woman with history of hypertension and colovesical fistula and mucous membrane pemphigoid recently started on methotrexate by dermatology who presented to the [facility] with dyspnea and fatigue since late June after returning from a trip to [place]. She has had progressive dyspnea with ambulation. She denies fevers, chills or sweats. She denies lower extremity edema. She denies chest pain.   In ER CTA Chest was done which was notable for bilateral pulmonary emboli involving the right main pulmonary artery and numerous lobar, segmental and subsegmental pulmonary arteries bilaterally. There is wedge-shaped airspace consolidations with air bronchograms in RML and RUL airspace consolidations concerning for pulmonary infarcts. RV/LV ratio = 1.05.   Patient has been started on heparin drip. Troponing and BNP are flat. Lactic Acid 1.6.  PCCM consulted for further evaluation and management with plan to transfer patient under care of hospitalist team.   Pertinent  Medical History   Past Medical History:  Diagnosis Date   Adenomatous colon polyp 2015   Allergy 1974   teenager, and 2008   Colovesical fistula 2015   COVID-19 06/2020   Environmental and seasonal allergies    Hx of adenomatous colonic polyps 01/19/2021   Hypertension 11/23   Low dose prescription   Lower abdominal pain 12/23/2019   Migraines    Motion sickness    Persistent cough for 3 weeks or longer 10/10/2021   Significant Hospital Events: Including procedures, antibiotic start and stop dates in addition to other pertinent events   8/8 admitted with bilateral pulmonary emboli  Interim History / Subjective:    Objective   Blood pressure 122/77, pulse 96, temperature 98 F  (36.7 C), resp. rate (!) 21, height 5\' 8"  (1.727 m), weight 81.2 kg, SpO2 95%.       No intake or output data in the 24 hours ending 05/14/23 1954 Filed Weights   05/14/23 1626  Weight: 81.2 kg    Examination: General: no acute distress, resting in bed HENT: /AT, moist mucous membranes Lungs: clear to auscultation bilaterally Cardiovascular: rrr, no murmurs Abdomen: soft, non-tender, non-distended, BS+ Extremities: warm, no edema Neuro: alert, oriented, moving all extremities GU: n/a  Echo 04/20/23 LV EF 70-75%. Grade I diastolic dysfunction. RV systolic function is mildly reduced. RV size is normal.     Resolved Hospital Problem list     Assessment & Plan:  Bilateral Pulmonary Emboli, Submassive  Pulmonary Infarcts Mild Hyponatremia Mild metabolic Acidosis Elevated Bilirubin Anemia Hyperglycemia  Plan: - PESI score 65, low risk but evidence of Right heart strain based on CT chest - Check Echo - continue heparin drip  - if develops hypotension or O2 need, low threshold for half-dose or full dose IV TPA therapy  PCCM will continue to follow   Best Practice (right click and "Reselect all SmartList Selections" daily)   Per primary team  Labs   CBC: Recent Labs  Lab 05/14/23 1749  WBC 13.1*  NEUTROABS 11.7*  HGB 11.7*  HCT 33.0*  MCV 94.3  PLT 188    Basic Metabolic Panel: Recent Labs  Lab 05/14/23 1749  NA 130*  K 4.5  CL 100  CO2 21*  GLUCOSE 407*  BUN 33*  CREATININE 0.69  CALCIUM 10.4*   GFR: Estimated Creatinine  Clearance: 78.4 mL/min (by C-G formula based on SCr of 0.69 mg/dL). Recent Labs  Lab 05/14/23 1749  WBC 13.1*    Liver Function Tests: Recent Labs  Lab 05/14/23 1749  AST 26  ALT 42  ALKPHOS 74  BILITOT 1.5*  PROT 6.5  ALBUMIN 3.7   No results for input(s): "LIPASE", "AMYLASE" in the last 168 hours. No results for input(s): "AMMONIA" in the last 168 hours.  ABG No results found for: "PHART", "PCO2ART",  "PO2ART", "HCO3", "TCO2", "ACIDBASEDEF", "O2SAT"   Coagulation Profile: Recent Labs  Lab 05/14/23 1930  INR 1.1    Cardiac Enzymes: No results for input(s): "CKTOTAL", "CKMB", "CKMBINDEX", "TROPONINI" in the last 168 hours.  HbA1C: Hgb A1c MFr Bld  Date/Time Value Ref Range Status  01/16/2022 09:06 AM 5.1 4.6 - 6.5 % Final    Comment:    Glycemic Control Guidelines for People with Diabetes:Non Diabetic:  <6%Goal of Therapy: <7%Additional Action Suggested:  >8%     CBG: No results for input(s): "GLUCAP" in the last 168 hours.  Review of Systems:   Review of Systems  Constitutional:  Positive for malaise/fatigue. Negative for chills, fever and weight loss.  HENT:  Negative for congestion, sinus pain and sore throat.   Eyes: Negative.   Respiratory:  Positive for shortness of breath. Negative for cough, hemoptysis, sputum production and wheezing.   Cardiovascular:  Negative for chest pain, palpitations, orthopnea, claudication and leg swelling.  Gastrointestinal:  Negative for abdominal pain, heartburn, nausea and vomiting.  Genitourinary: Negative.   Musculoskeletal:  Negative for joint pain and myalgias.  Skin:  Negative for rash.  Neurological:  Negative for weakness.  Endo/Heme/Allergies: Negative.   Psychiatric/Behavioral: Negative.       Past Medical History:  She,  has a past medical history of Adenomatous colon polyp (2015), Allergy (1974), Colovesical fistula (2015), COVID-19 (06/2020), Environmental and seasonal allergies, adenomatous colonic polyps (01/19/2021), Hypertension (11/23), Lower abdominal pain (12/23/2019), Migraines, Motion sickness, and Persistent cough for 3 weeks or longer (10/10/2021).   Surgical History:   Past Surgical History:  Procedure Laterality Date   COLON SURGERY  2016   fistula, small section removed   COLONOSCOPY  2015   Plus others   LAPAROSCOPIC LEFT COLON RESECTION  2015   Colovesical fistula, [place]   Left peroneal tendon  repair Left 2023   WISDOM TOOTH EXTRACTION       Social History:   reports that she has never smoked. She has never used smokeless tobacco. She reports current alcohol use of about 5.0 standard drinks of alcohol per week. She reports that she does not use drugs.   Family History:  Her family history includes Alcohol abuse in her father; COPD in her father; Cancer in her father and maternal grandmother; Diabetes in her brother, brother, and mother; Hearing loss in her mother; Heart disease in her father and mother; Intellectual disability in her brother; Learning disabilities in her brother; Malignant hyperthermia in her sister; Miscarriages / Stillbirths in her maternal grandmother, sister, and sister; Pancreatic cancer in her maternal aunt; Prostate cancer in her father; Vision loss in her brother. There is no history of Colon cancer, Esophageal cancer, Stomach cancer, Liver disease, or Breast cancer.   Allergies Allergies  Allergen Reactions   Aspirin Hives   Ibuprofen Hives   Other     Anesthesia (familial) SISTER HAD MALIGNANT HYPERTHERMIA   Propofol Other (See Comments)     Home Medications  Prior to Admission medications   Medication  Sig Start Date End Date Taking? Authorizing Provider  losartan (COZAAR) 25 MG tablet TAKE 1 TABLET BY MOUTH EVERY DAY FOR BLOOD PRESSURE 01/21/23   [name], NP  methotrexate (RHEUMATREX) 2.5 MG tablet Take 2.5 mg by mouth. 04/28/23   [provider]  predniSONE (DELTASONE) 10 MG tablet Take 25 mg by mouth daily in the afternoon. 01/14/23   [provider]  scopolamine (TRANSDERM-SCOP) 1 MG/3DAYS Place 1 patch (1.5 mg total) onto the skin every 3 (three) days. Patient not taking: Reported on 05/05/2023 03/04/23   [name], NP     Critical care time: n/a    Melody Comas, MD [facility] Office: [phone]   See Amion for personal pager PCCM on call pager [phone] until  7pm. Please call Elink 7p-7a. [phone]

## 2023-05-14 NOTE — H&P (Incomplete)
History and Physical    Patient: Heather Hudson VWU:[id number] DOB: 01/23/1956 DOA: 05/14/2023 DOS: the patient was seen and examined on 05/14/2023 PCP: [name], NP  Patient coming from: Home>advised to come to ER/hospital by PCP  Chief Complaint:  Chief Complaint  Patient presents with  . Shortness of Breath  . Fatigue   HPI: Heather Hudson is a 67 y.o. 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  Patient was in her usual state of health till approximately 6 weeks ago when she reports a new onset syndrome of fatigue/shortness of breath that was brought on by exertion such as ambulating 50 to 100 feet.  However since then patient has had a progressive decline in her ability to exert before she has symptoms of marked tachypnea/dyspnea/fatigue.  Today patient was only able to ambulate 15 feet or so before she was markedly disabled by her symptoms.  There is no report of cough, chest pain, palpitation, leg swelling.  Patient has had leg surgery done in 2023 on the left side and patient has some chronic discomfort of the leg since then.  No change in leg symptoms since then.  Patient underwent remote/virtual visit with primary care provider.  Given her marked limitation in ambulation, she was advised to come to the ER.  Patient was initially evaluated at [facility] where a CAT scan revealed bilateral pulmonary emboli with RV to LV ratio more than 1.  Patient is subsequently transferred to [facility] for further evaluation.  Patient has been getting heparin infusion  Patient is asymptomatic at rest in the stretcher Review of Systems: As mentioned in the history of present illness. All other systems reviewed and are negative. Past Medical History:  Diagnosis Date  . Adenomatous colon polyp 2015  . Allergy 1974   teenager, and 2008  . Colovesical fistula 2015  . COVID-19 06/2020  . Environmental  and seasonal allergies   . Hx of adenomatous colonic polyps 01/19/2021  . Hypertension 11/23   Low dose prescription  . Lower abdominal pain 12/23/2019  . Migraines   . Motion sickness   . Persistent cough for 3 weeks or longer 10/10/2021   Past Surgical History:  Procedure Laterality Date  . COLON SURGERY  2016   fistula, small section removed  . COLONOSCOPY  2015   Plus others  . LAPAROSCOPIC LEFT COLON RESECTION  2015   Colovesical fistula, [place]  . Left peroneal tendon repair Left 2023  . WISDOM TOOTH EXTRACTION     Social History:  reports that she has never smoked. She has never used smokeless tobacco. She reports current alcohol use of about 5.0 standard drinks of alcohol per week. She reports that she does not use drugs.  Allergies  Allergen Reactions  . Aspirin Hives  . Ibuprofen Hives  . Other     Anesthesia (familial) SISTER HAD MALIGNANT HYPERTHERMIA  . Propofol Other (See Comments)    Family History  Problem Relation Age of Onset  . Heart disease Mother   . Diabetes Mother   . Hearing loss Mother   . Heart disease Father   . Prostate cancer Father   . Alcohol abuse Father   . Cancer Father   . COPD Father   . Malignant hyperthermia Sister   . Pancreatic cancer Maternal Aunt   . Diabetes Brother   . Cancer Maternal Grandmother   . Miscarriages / Stillbirths Maternal Grandmother   . Diabetes  Brother   . Intellectual disability Brother   . Learning disabilities Brother   . Vision loss Brother   . Miscarriages / Stillbirths Sister   . Miscarriages / Stillbirths Sister   . Colon cancer Neg Hx   . Esophageal cancer Neg Hx   . Stomach cancer Neg Hx   . Liver disease Neg Hx   . Breast cancer Neg Hx     Prior to Admission medications   Medication Sig Start Date End Date Taking? Authorizing Provider  losartan (COZAAR) 25 MG tablet TAKE 1 TABLET BY MOUTH EVERY DAY FOR BLOOD PRESSURE 01/21/23   [name], NP  methotrexate (RHEUMATREX) 2.5 MG  tablet Take 2.5 mg by mouth. 04/28/23   [provider]  predniSONE (DELTASONE) 10 MG tablet Take 25 mg by mouth daily in the afternoon. 01/14/23   [provider]  scopolamine (TRANSDERM-SCOP) 1 MG/3DAYS Place 1 patch (1.5 mg total) onto the skin every 3 (three) days. Patient not taking: Reported on 05/05/2023 03/04/23   [name], NP    Physical Exam: Vitals:   05/14/23 2052 05/14/23 2100 05/14/23 2122 05/14/23 2205  BP:  121/83    Pulse:  94  90  Resp:  (!) 29  (!) 24  Temp: 98.7 F (37.1 C)     TempSrc: Oral     SpO2:  95% 94% 97%  Weight:      Height:       Central: Patient is alert and awake gives coherent account of her symptoms no distress is apparent Patient is on 2 L/min of supplementary oxygen that was started just prior to patient being transferred to [facility] when her pulse oximetry was noted to be 90% on room air Respiratory; bilateral intravesically Cardiovascular exam S1-S2 normal Abdomen soft nontender Extremities warm without edema Skin no rash Data Reviewed: {Tip this will not be part of the note when signed- Document your independent interpretation of telemetry tracing, EKG, lab, Radiology test or any other diagnostic tests. Add any new diagnostic test ordered today. (Optional):26781} Labs on Admission:  Results for orders placed or performed during the hospital encounter of 05/14/23 (from the past 24 hour(s))  Brain natriuretic peptide     Status: None   Collection Time: 05/14/23  5:30 PM  Result Value Ref Range   B Natriuretic Peptide 37.4 0.0 - 100.0 pg/mL  CBC with Differential     Status: Abnormal   Collection Time: 05/14/23  5:49 PM  Result Value Ref Range   WBC 13.1 (H) 4.0 - 10.5 K/uL   RBC 3.50 (L) 3.87 - 5.11 MIL/uL   Hemoglobin 11.7 (L) 12.0 - 15.0 g/dL   HCT 40.9 (L) 81.1 - 91.4 %   MCV 94.3 80.0 - 100.0 fL   MCH 33.4 26.0 - 34.0 pg   MCHC 35.5 30.0 - 36.0 g/dL   RDW 78.2 (H) 95.6 - 21.3 %   Platelets 188 150 - 400  K/uL   nRBC 0.4 (H) 0.0 - 0.2 %   Neutrophils Relative % 89 %   Neutro Abs 11.7 (H) 1.7 - 7.7 K/uL   Lymphocytes Relative 5 %   Lymphs Abs 0.7 0.7 - 4.0 K/uL   Monocytes Relative 4 %   Monocytes Absolute 0.5 0.1 - 1.0 K/uL   Eosinophils Relative 0 %   Eosinophils Absolute 0.0 0.0 - 0.5 K/uL   Basophils Relative 0 %   Basophils Absolute 0.0 0.0 - 0.1 K/uL   Immature Granulocytes 2 %  Abs Immature Granulocytes 0.23 (H) 0.00 - 0.07 K/uL  Comprehensive metabolic panel     Status: Abnormal   Collection Time: 05/14/23  5:49 PM  Result Value Ref Range   Sodium 130 (L) 135 - 145 mmol/L   Potassium 4.5 3.5 - 5.1 mmol/L   Chloride 100 98 - 111 mmol/L   CO2 21 (L) 22 - 32 mmol/L   Glucose, Bld 407 (H) 70 - 99 mg/dL   BUN 33 (H) 8 - 23 mg/dL   Creatinine, Ser 6.29 0.44 - 1.00 mg/dL   Calcium 52.8 (H) 8.9 - 10.3 mg/dL   Total Protein 6.5 6.5 - 8.1 g/dL   Albumin 3.7 3.5 - 5.0 g/dL   AST 26 15 - 41 U/L   ALT 42 0 - 44 U/L   Alkaline Phosphatase 74 38 - 126 U/L   Total Bilirubin 1.5 (H) 0.3 - 1.2 mg/dL   GFR, Estimated >41 >32 mL/min   Anion gap 9 5 - 15  Troponin I (High Sensitivity)     Status: None   Collection Time: 05/14/23  5:49 PM  Result Value Ref Range   Troponin I (High Sensitivity) 15 <18 ng/L  Resp panel by RT-PCR (RSV, Flu A&B, Covid) Anterior Nasal Swab     Status: None   Collection Time: 05/14/23  5:50 PM   Specimen: Anterior Nasal Swab  Result Value Ref Range   SARS Coronavirus 2 by RT PCR NEGATIVE NEGATIVE   Influenza A by PCR NEGATIVE NEGATIVE   Influenza B by PCR NEGATIVE NEGATIVE   Resp Syncytial Virus by PCR NEGATIVE NEGATIVE  APTT     Status: None   Collection Time: 05/14/23  7:30 PM  Result Value Ref Range   aPTT 32 24 - 36 seconds  Protime-INR     Status: None   Collection Time: 05/14/23  7:30 PM  Result Value Ref Range   Prothrombin Time 14.2 11.4 - 15.2 seconds   INR 1.1 0.8 - 1.2  Troponin I (High Sensitivity)     Status: None   Collection Time:  05/14/23  7:30 PM  Result Value Ref Range   Troponin I (High Sensitivity) 13 <18 ng/L  Lactic acid, plasma     Status: None   Collection Time: 05/14/23  7:39 PM  Result Value Ref Range   Lactic Acid, Venous 1.6 0.5 - 1.9 mmol/L   Basic Metabolic Panel: Recent Labs  Lab 05/14/23 1749  NA 130*  K 4.5  CL 100  CO2 21*  GLUCOSE 407*  BUN 33*  CREATININE 0.69  CALCIUM 10.4*   Liver Function Tests: Recent Labs  Lab 05/14/23 1749  AST 26  ALT 42  ALKPHOS 74  BILITOT 1.5*  PROT 6.5  ALBUMIN 3.7   No results for input(s): "LIPASE", "AMYLASE" in the last 168 hours. No results for input(s): "AMMONIA" in the last 168 hours. CBC: Recent Labs  Lab 05/14/23 1749  WBC 13.1*  NEUTROABS 11.7*  HGB 11.7*  HCT 33.0*  MCV 94.3  PLT 188   Cardiac Enzymes: Recent Labs  Lab 05/14/23 1749 05/14/23 1930  TROPONINIHS 15 13    BNP (last 3 results) No results for input(s): "PROBNP" in the last 8760 hours. CBG: No results for input(s): "GLUCAP" in the last 168 hours.  Radiological Exams on Admission:  CT Angio Chest PE W and/or Wo Contrast  Result Date: 05/14/2023 CLINICAL DATA:  Pulmonary embolism (PE) suspected, high prob EXAM: CT ANGIOGRAPHY CHEST WITH CONTRAST TECHNIQUE: Multidetector CT  imaging of the chest was performed using the standard protocol during bolus administration of intravenous contrast. Multiplanar CT image reconstructions and MIPs were obtained to evaluate the vascular anatomy. RADIATION DOSE REDUCTION: This exam was performed according to the departmental dose-optimization program which includes automated exposure control, adjustment of the mA and/or kV according to patient size and/or use of iterative reconstruction technique. CONTRAST:  75mL OMNIPAQUE IOHEXOL 350 MG/ML SOLN COMPARISON:  05/05/2023 FINDINGS: Cardiovascular: Satisfactory opacification of the pulmonary arteries. Extensive bilateral pulmonary emboli involving both main pulmonary arteries, right  greater than left. Thrombus involves numerous lobar, segmental, and subsegmental branch pulmonary arteries bilaterally. No saddle embolism. Mildly elevated RV to LV ratio of 1.05. Heart size is normal. No pericardial effusion. Thoracic aorta is nonaneurysmal. Mediastinum/Nodes: No enlarged mediastinal, hilar, or axillary lymph nodes. Thyroid gland, trachea, and esophagus demonstrate no significant findings. Lungs/Pleura: Wedge-shaped airspace consolidation with air bronchograms within the right middle lobe. Additional smaller areas of airspace consolidation within the periphery of the right upper lobe and superior aspect of the right lower lobe. Bibasilar atelectasis. No pleural effusion or pneumothorax. Upper Abdomen: No acute abnormality. Musculoskeletal: No chest wall abnormality. No acute or significant osseous findings. Review of the MIP images confirms the above findings. IMPRESSION: 1. Positive for acute bilateral PE with CT evidence of right heart strain (RV/LV Ratio = 1.05) consistent with at least submassive (intermediate risk) PE. The presence of right heart strain has been associated with an increased risk of morbidity and mortality. Please refer to the "Code PE Focused" order set in EPIC. 2. Wedge-shaped airspace consolidation within the right middle lobe with additional smaller areas of airspace consolidation within the periphery of the right upper lobe and superior aspect of the right lower lobe. Findings are most concerning for pulmonary infarcts. Multifocal pneumonia could also result in this appearance in the appropriate clinical setting. Critical Value/emergent results were called by telephone at the time of interpretation on 05/14/2023 at 7:10 pm to provider Dr. Tanda Rockers , who verbally acknowledged these results. Electronically Signed   By: Duanne Guess D.O.   On: 05/14/2023 19:10    EKG: Independently reviewed. Sinus tachycardai.   Assessment and Plan: No notes have been filed under  this hospital service. Service: Hospitalist     Advance Care Planning:   Code Status: Full Code ***  Consults: ***  Family Communication: ***  Severity of Illness: {Observation/Inpatient:21159}  Author: Nolberto Hanlon, MD 05/14/2023 11:54 PM  For on call review [email or web address].

## 2023-05-14 NOTE — Progress Notes (Signed)
Virtual Visit Consent   Heather Hudson, you are scheduled for a virtual visit with a [facility] provider today. Just as with appointments in the office, your consent must be obtained to participate. Your consent will be active for this visit and any virtual visit you may have with one of our providers in the next 365 days. If you have a MyChart account, a copy of this consent can be sent to you electronically.  As this is a virtual visit, video technology does not allow for your provider to perform a traditional examination. This may limit your provider's ability to fully assess your condition. If your provider identifies any concerns that need to be evaluated in person or the need to arrange testing (such as labs, EKG, etc.), we will make arrangements to do so. Although advances in technology are sophisticated, we cannot ensure that it will always work on either your end or our end. If the connection with a video visit is poor, the visit may have to be switched to a telephone visit. With either a video or telephone visit, we are not always able to ensure that we have a secure connection.  By engaging in this virtual visit, you consent to the provision of healthcare and authorize for your insurance to be billed (if applicable) for the services provided during this visit. Depending on your insurance coverage, you may receive a charge related to this service.  I need to obtain your verbal consent now. Are you willing to proceed with your visit today? Heather Hudson has provided verbal consent on 05/14/2023 for a virtual visit (video or telephone). Heather Loveless, PA-C  Date: 05/14/2023 12:59 PM  Virtual Visit via Video Note   I, Heather Hudson, connected with  Heather Hudson  ([id number], 05/17/1956) on 05/14/23 at 12:15 PM EDT by a video-enabled telemedicine application and verified that I am speaking with the correct person using two identifiers.  Location: Patient: Virtual Visit Location  Patient: Home Provider: Virtual Visit Location Provider: Home Office   I discussed the limitations of evaluation and management by telemedicine and the availability of in person appointments. The patient expressed understanding and agreed to proceed.    History of Present Illness: Heather Hudson is a 67 y.o. who identifies as a female who was assigned female at birth, and is being seen today for worsening fatigue and DOE. This started near the end of June 2024 and has been progressively worsening since then.   She did see her PCP on 05/05/23 and had labs and CXR. Labs were overall unremarkable and negative for Myasthenia Gravis. There was a positive ANA with a low positive antibody titer 1:40. Her PCP was not concerned but was going to reach out to Rheumatology to make sure nothing else needed to be done in follow up.   She has seen Cardiology as well with an overall negative work-up. Echo showed EF 70-75% with low grade 1 LV dysfunction, cardiac calcium CT scoring was 0%, low risk. Felt that any DOE was more from physical deconditioning.   Reports now fatigue, lower extremity weakness, and dyspnea have progressively worsened this week. Now they are affecting even normal activities. Has to sit and rest even after walking to the restroom less than 10-15 ft away. She has tried to contact her Dermatologist and PCP without success over the last week without a return message or contact. Called PCP today and was advised to try a video visit with the [facility] team  to see if anything could be done.   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 She is followed by [facility]. She has been on high dose prednisone, but has been on a tapering dose since April 23, 2023. She does also use topical Clobetasol for spot treatment. She is starting Methotrexate.  It was felt that the weakness in the lower extremities could have been secondary to the prednisone, which was a reason to start the  tapering dose.   Problems:  Patient Active Problem List   Diagnosis Date Noted   Weakness of both lower extremities 05/05/2023   Exertional dyspnea 05/05/2023   Chronic joint pain 05/05/2023   Welcome to Medicare preventive visit 01/20/2023   Mucous membrane pemphigoid 01/20/2023   Primary hypertension 06/26/2022   Rash 10/10/2021   Hx of adenomatous colonic polyps 01/19/2021   Preventative health care 09/07/2018   Hyperlipidemia 09/07/2018   Environmental and seasonal allergies 01/26/2018   Motion sickness 01/26/2018    Allergies:  Allergies  Allergen Reactions   Aspirin Hives   Ibuprofen Hives   Other     Anesthesia (familial) SISTER HAD MALIGNANT HYPERTHERMIA   Propofol Other (See Comments)   Medications:  Current Outpatient Medications:    losartan (COZAAR) 25 MG tablet, TAKE 1 TABLET BY MOUTH EVERY DAY FOR BLOOD PRESSURE, Disp: 90 tablet, Rfl: 3   methotrexate (RHEUMATREX) 2.5 MG tablet, Take 2.5 mg by mouth., Disp: , Rfl:    predniSONE (DELTASONE) 10 MG tablet, Take 25 mg by mouth daily in the afternoon., Disp: , Rfl:    scopolamine (TRANSDERM-SCOP) 1 MG/3DAYS, Place 1 patch (1.5 mg total) onto the skin every 3 (three) days. (Patient not taking: Reported on 05/05/2023), Disp: 10 patch, Rfl: 0  Current Facility-Administered Medications:    0.9 %  sodium chloride infusion, 500 mL, Intravenous, Once, Iva Boop, MD  Observations/Objective: Patient is well-developed, well-nourished in no acute distress.  Resting comfortably at home.  Head is normocephalic, atraumatic.  No labored breathing.  Speech is clear and coherent with logical content.  Patient is alert and oriented at baseline.    Assessment and Plan: 1. Chronic fatigue  2. Weakness of both lower extremities  3. DOE (dyspnea on exertion)  - Progressively worsening gradually - Advised may need emergent evaluation at closest ER since now unable to ambulate more than 15 ft without tiring or becoming  winded  Follow Up Instructions: I discussed the assessment and treatment plan with the patient. The patient was provided an opportunity to ask questions and all were answered. The patient agreed with the plan and demonstrated an understanding of the instructions.  A copy of instructions were sent to the patient via MyChart unless otherwise noted below.    The patient was advised to call back or seek an in-person evaluation if the symptoms worsen or if the condition fails to improve as anticipated.  Time:  I spent 15 minutes with the patient via telehealth technology discussing the above problems/concerns.    Heather Loveless, PA-C

## 2023-05-14 NOTE — Telephone Encounter (Signed)
Noted. Will await notes.  

## 2023-05-14 NOTE — ED Triage Notes (Signed)
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 She is now reporting increased fatigue and shortness of breath over the past month.   Patient did have labs and workup with her PCP last week.

## 2023-05-14 NOTE — Telephone Encounter (Signed)
Morrie Sheldon, can you triage this patient? I'm really concerned about her message and symptoms.

## 2023-05-15 ENCOUNTER — Other Ambulatory Visit (HOSPITAL_COMMUNITY): Payer: Self-pay

## 2023-05-15 ENCOUNTER — Inpatient Hospital Stay (HOSPITAL_COMMUNITY): Payer: PPO

## 2023-05-15 DIAGNOSIS — R0602 Shortness of breath: Secondary | ICD-10-CM | POA: Diagnosis not present

## 2023-05-15 DIAGNOSIS — I2699 Other pulmonary embolism without acute cor pulmonale: Secondary | ICD-10-CM | POA: Diagnosis not present

## 2023-05-15 DIAGNOSIS — M7989 Other specified soft tissue disorders: Secondary | ICD-10-CM

## 2023-05-15 LAB — ECHOCARDIOGRAM LIMITED
AR max vel: 2.15 cm2
AV Area VTI: 2.14 cm2
AV Area mean vel: 1.95 cm2
AV Mean grad: 5 mmHg
AV Peak grad: 8.9 mmHg
Ao pk vel: 1.49 m/s
Area-P 1/2: 3.34 cm2
Calc EF: 63.9 %
Height: 68 in
MV VTI: 2.35 cm2
S' Lateral: 3.2 cm
Single Plane A2C EF: 64.8 %
Single Plane A4C EF: 64 %
Weight: 2864.22 oz

## 2023-05-15 LAB — PROCALCITONIN: Procalcitonin: <0.1 ng/mL

## 2023-05-15 LAB — HEPATIC FUNCTION PANEL
ALT: 39 U/L (ref 0–44)
AST: 21 U/L (ref 15–41)
Albumin: 3 g/dL — ABNORMAL LOW (ref 3.5–5.0)
Alkaline Phosphatase: 74 U/L (ref 38–126)
Bilirubin, Direct: 0.3 mg/dL — ABNORMAL HIGH (ref 0.0–0.2)
Indirect Bilirubin: 1 mg/dL — ABNORMAL HIGH (ref 0.3–0.9)
Total Bilirubin: 1.3 mg/dL — ABNORMAL HIGH (ref 0.3–1.2)
Total Protein: 5.9 g/dL — ABNORMAL LOW (ref 6.5–8.1)

## 2023-05-15 LAB — CULTURE, BLOOD (ROUTINE X 2)
Culture: NO GROWTH
Culture: NO GROWTH
Special Requests: ADEQUATE
Special Requests: ADEQUATE

## 2023-05-15 LAB — HEPARIN LEVEL (UNFRACTIONATED): Heparin Unfractionated: 0.31 IU/mL (ref 0.30–0.70)

## 2023-05-15 LAB — GLUCOSE, CAPILLARY
Glucose-Capillary: 194 mg/dL — ABNORMAL HIGH (ref 70–99)
Glucose-Capillary: 237 mg/dL — ABNORMAL HIGH (ref 70–99)
Glucose-Capillary: 266 mg/dL — ABNORMAL HIGH (ref 70–99)
Glucose-Capillary: 267 mg/dL — ABNORMAL HIGH (ref 70–99)
Glucose-Capillary: 339 mg/dL — ABNORMAL HIGH (ref 70–99)

## 2023-05-15 LAB — VITAMIN D 25 HYDROXY (VIT D DEFICIENCY, FRACTURES): Vit D, 25-Hydroxy: 16.88 ng/mL — ABNORMAL LOW (ref 30–100)

## 2023-05-15 LAB — PHOSPHORUS: Phosphorus: 2.9 mg/dL (ref 2.5–4.6)

## 2023-05-15 MED ORDER — INSULIN GLARGINE-YFGN 100 UNIT/ML ~~LOC~~ SOLN
10.0000 [IU] | Freq: Every day | SUBCUTANEOUS | Status: DC
  Administered 2023-05-15 – 2023-05-21 (×6): 10 [IU] via SUBCUTANEOUS
  Filled 2023-05-15 (×10): qty 0.1

## 2023-05-15 MED ORDER — SODIUM CHLORIDE 0.9 % IV SOLN
3.0000 g | Freq: Three times a day (TID) | INTRAVENOUS | Status: DC
  Administered 2023-05-15 – 2023-05-16 (×5): 3 g via INTRAVENOUS
  Filled 2023-05-15 (×5): qty 8

## 2023-05-15 MED ORDER — SODIUM CHLORIDE 0.9 % IV SOLN
500.0000 mg | Freq: Every day | INTRAVENOUS | Status: AC
  Administered 2023-05-15 – 2023-05-16 (×3): 500 mg via INTRAVENOUS
  Filled 2023-05-15 (×4): qty 5

## 2023-05-15 NOTE — Assessment & Plan Note (Signed)
Blood pressure is acceptable, continue with losartan

## 2023-05-15 NOTE — Progress Notes (Signed)
Pharmacy Antibiotic Note  Heather Hudson is a 67 y.o. female admitted on 05/14/2023 with pneumonia.  Pharmacy has been consulted for Unasyn dosing. WBC mildly elevated. Renal function ok. Also here for acute PE.   Plan: Unasyn 3g IV q8h  Height: 5\' 8"  (172.7 cm) Weight: 81.2 kg (179 lb 0.2 oz) IBW/kg (Calculated) : 63.9  Temp (24hrs), Avg:98.4 F (36.9 C), Min:98 F (36.7 C), Max:98.7 F (37.1 C)  Recent Labs  Lab 05/14/23 1749 05/14/23 1939  WBC 13.1*  --   CREATININE 0.69  --   LATICACIDVEN  --  1.6    Estimated Creatinine Clearance: 78.4 mL/min (by C-G formula based on SCr of 0.69 mg/dL).    Allergies  Allergen Reactions   Aspirin Hives   Ibuprofen Hives   Other     Anesthesia (familial) SISTER HAD MALIGNANT HYPERTHERMIA   Propofol Other (See Comments)    Abran Duke, PharmD, BCPS Clinical Pharmacist Phone: [phone]

## 2023-05-15 NOTE — TOC CM/SW Note (Signed)
Transition of Care [facility]) - Inpatient Brief Assessment   Patient Details  Name: Heather Hudson MRN: [id number] Date of Birth: 1956-05-16  Transition of [facility] CM/SW Contact:    Darrold Span, RN Phone Number: 05/15/2023, 3:57 PM   Clinical Narrative: Pt admitted with BIL PE/DVT,  We will continue to monitor patient advancement through interdisciplinary progression rounds. If new patient transition needs arise, please place a TOC consult.   Transition of Care Asessment: Insurance and Status: Insurance coverage has been reviewed Patient has primary care physician: Yes Home environment has been reviewed: home Prior level of function:: self Prior/Current Home Services: No current home services Social Determinants of Health Reivew: SDOH reviewed no interventions necessary Readmission risk has been reviewed: Yes Transition of care needs: no transition of care needs at this time

## 2023-05-15 NOTE — Assessment & Plan Note (Signed)
[name], MD - 04/23/2023 8:45 AM EDT Formatting of this note is different from the original. Images from the original note were not included. Dermatology Note  Assessment and Plan:  Mucous membrane pemphigoid, chronic illness with continued disease activity: -Patient has oral biopsy with immunofluorescence demonstrating mucous membrane pemphigoid (reviewed in EPIC). She has been on high doses of prednisone and is experiencing side effects from prednisone including muscle weakness, facial plethora; continued disease activity, but overall improved from baseline. -Diagnosis, treatment options, prognosis, risk/ benefit, and side effects were discussed with the patient - Pending labs below, will start 10 mg methotrexate with folic acid supplementation - START prednisone taper, recommended PPI, calcium, and vitamin D while on steroids. Will hold off on antibiotic prophylaxis at this time given tapering and on lower dose - START predniSONE (DELTASONE) 10 MG tablet; Take 3 tablets (30 mg total) by mouth daily for 7 days, THEN 2.5 tablets (25 mg total) daily for 7 days, THEN 2 tablets (20 mg total) daily for 7 days, THEN 1.5 tablets (15 mg total) daily for 14 days. Dispense: 74 tablet; Refill: 0 - START clobetasol (TEMOVATE) 0.05 % Gel; Apply to affected areas of the mouth twice daily while flaring. Dispense: 30 g; Refill: 0

## 2023-05-15 NOTE — Progress Notes (Signed)
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 She has had progressive dyspnea with ambulation. She denies fevers, chills or sweats. She denies lower extremity edema. She denies chest pain.   In ER CTA Chest was done which was notable for bilateral pulmonary emboli involving the right main pulmonary artery and numerous lobar, segmental and subsegmental pulmonary arteries bilaterally. There is wedge-shaped airspace consolidations with air bronchograms in RML and RUL airspace consolidations concerning for pulmonary infarcts. RV/LV ratio = 1.05.   Patient has been started on heparin drip. Troponing and BNP are flat. Lactic Acid 1.6.  PCCM consulted for further evaluation and management with plan to transfer patient under care of hospitalist team.   Pertinent  Medical History   Past Medical History:  Diagnosis Date   Adenomatous colon polyp 2015   Allergy 1974   teenager, and 2008   Colovesical fistula 2015   COVID-19 06/2020   Environmental and seasonal allergies    Hx of adenomatous colonic polyps 01/19/2021   Hypertension 11/23   Low dose prescription   Lower abdominal pain 12/23/2019   Migraines    Motion sickness    Persistent cough for 3 weeks or longer 10/10/2021   Significant Hospital Events: Including procedures, antibiotic start and stop dates in addition to other pertinent events   8/8 admitted with bilateral pulmonary emboli  Interim History / Subjective:   Sitting up eating breakfast o Blood pressure 122/71, pulse 83,  temperature 98.2 F (36.8 C), temperature source Oral, resp. rate (!) 24, height 5\' 8"  (1.727 m), weight 81.2 kg, SpO2 98%.        Intake/Output Summary (Last 24 hours) at 05/15/2023 1042 Last data filed at 05/15/2023 1000 Gross per 24 hour  Intake 1000 ml  Output 500 ml  Net 500 ml   Filed Weights   05/14/23 1626  Weight: 81.2 kg    Examination: Awake alert no acute distress at rest No JVD or lymphadenopathy Breath sounds clear to auscultation currently on 2 L with sats 94% Heart sounds are regular Lower extremity tenderness bilateral  Echo 04/20/23 LV EF 70-75%. Grade I diastolic dysfunction. RV systolic function is mildly reduced. RV size is normal.     Resolved Hospital Problem list     Assessment & Plan:  Bilateral Pulmonary Emboli, Submassive  Pulmonary Infarcts Mild Hyponatremia Mild metabolic Acidosis Elevated Bilirubin Anemia Hyperglycemia Bilateral lower extremity DVTs  Plan: Continue heparin drip 2D echo is pending Hemodynamically stable therefore no indication for IV tPA therapy Bilateral lower extremity DVTs She notes her father had a clotting disorder     Best Practice (right click and "Reselect all SmartList Selections" daily)   Per primary team  Labs   CBC: Recent Labs  Lab 05/14/23 1749 05/15/23 0146  WBC 13.1* 9.3  NEUTROABS 11.7*  --   HGB 11.7* 11.7*  HCT 33.0* 34.1*  MCV 94.3 93.9  PLT 188 162    Basic Metabolic Panel: Recent Labs  Lab 05/14/23 1749 05/15/23 0146  NA 130* 133*  K 4.5 3.3*  CL 100 103  CO2 21* 18*  GLUCOSE 407* 285*  BUN 33* 25*  CREATININE 0.69 0.68  CALCIUM 10.4* 9.6  PHOS  --  2.9   GFR: Estimated Creatinine Clearance: 78.4 mL/min (by C-G formula based on SCr of 0.68 mg/dL). Recent Labs  Lab 05/14/23 1749 05/14/23 1939 05/15/23 0146  WBC 13.1*  --  9.3  LATICACIDVEN  --  1.6 1.4    Liver Function Tests: Recent Labs  Lab 05/14/23 1749 05/15/23 0146  AST 26 21  ALT 42 39  ALKPHOS 74  74  BILITOT 1.5* 1.3*  PROT 6.5 5.9*  ALBUMIN 3.7 3.0*   No results for input(s): "LIPASE", "AMYLASE" in the last 168 hours. No results for input(s): "AMMONIA" in the last 168 hours.  ABG No results found for: "PHART", "PCO2ART", "PO2ART", "HCO3", "TCO2", "ACIDBASEDEF", "O2SAT"   Coagulation Profile: Recent Labs  Lab 05/14/23 1930 05/15/23 0146  INR 1.1 1.1    Cardiac Enzymes: No results for input(s): "CKTOTAL", "CKMB", "CKMBINDEX", "TROPONINI" in the last 168 hours.  HbA1C: Hgb A1c MFr Bld  Date/Time Value Ref Range Status  05/15/2023 01:46 AM 7.6 (H) 4.8 - 5.6 % Final    Comment:    (NOTE) Pre diabetes:          5.7%-6.4%  Diabetes:              >6.4%  Glycemic control for   <7.0% adults with diabetes   01/16/2022 09:06 AM 5.1 4.6 - 6.5 % Final    Comment:    Glycemic Control Guidelines for People with Diabetes:Non Diabetic:  <6%Goal of Therapy: <7%Additional Action Suggested:  >8%     CBG: Recent Labs  Lab 05/15/23 0010 05/15/23 0945  GLUCAP 237* 194*         Steve  ACNP Acute Care Nurse Practitioner [facility]/Critical Care Please consult Amion 05/15/2023, 10:42 AM

## 2023-05-15 NOTE — Progress Notes (Addendum)
PROGRESS NOTE    Heather Hudson  GNF:[id number] DOB: Oct 15, 1955 DOA: 05/14/2023 PCP: [name], NP  Outpatient Specialists:     Brief Narrative:  Patient is a 67 year old female past medical history significant for COVID-19 infection, autoimmune disease/mucocutaneous pemphigoid and remote colovesical fistula.  Patient has been on prednisone on methotrexate.  Patient was admitted with acute pulmonary embolism and bilateral lower extremity DVT.  History of travel to [place] reported.  Patient is currently on heparin.  Assessment & Plan:   Principal Problem:   Acute pulmonary embolism (HCC) Active Problems:   Primary hypertension   Mucous membrane pemphigoid   Pulmonary embolism (HCC)   Hyperglycemia   Hypercalcemia   Acute pulmonary embolism (HCC) -Without any hemodynamic instability.  However associated pulmonary infarcts as well as associated borderline questionable hypoxemia.  Patient being treated with supplementary oxygen with plan to check orthostatic in the morning as well as ambulatory pulse oximetry.  Patient has been started on heparin infusion, we will monitor patient's clinical evolution on this.  I advised patient to report any new symptoms including but not limited to headache, new back pain any neurologic symptoms on the lower extremity such as tingling or weakness or bladder or bowel changes or any black liquid stool to the staff right away. Check lower extremity dopplers.   Patient is noted to have leukocytosis, sinus tachycardia, and consolidation on CAT scan.  This is likely due to concern infarction of the lung.  However patient is immunocompromise getting steroid therapy as well as methotrexate.  At this time therefore I will check blood cultures and start empiric treatment for pneumonia 05/15/2023: Continue heparin.  Likely follow-up with hematology team on discharge.  Incentive spirometry.   Hypercalcemia Very mild, incidental.  I will check PTH, vitamin D  Phos morning. Noted on April 16th as well. 05/15/2023: Resolved.   Hyperglycemia This is likely been precipitated by patient being started on steroid therapy recently.  See below.  I will maintain the patient on insulin sliding scale at this time. 05/15/2023: Add Semglee.  A1c 7.6%.  Consult diabetic educator.  Patient has been on steroids.   Mucous membrane pemphigoid [name], MD - 04/23/2023 8:45 AM EDT Formatting of this note is different from the original. Images from the original note were not included. Dermatology Note  05/15/2023: Hematology team is managing.   DVT prophylaxis: Heparin. Code Status: Full code. Family Communication: Sister. Disposition Plan: Home eventually.  Remains inpatient.   Consultants:  None for now. Follow-up with hematology on discharge.  Procedures:  None.  Antimicrobials:  IV Unasyn.  Have a low threshold to discontinue antibiotics.  Check procalcitonin.   Subjective: -No fever or chills. -No significant shortness of breath.  Objective: Vitals:   05/15/23 0225 05/15/23 0758 05/15/23 1237 05/15/23 1620  BP: 120/65 122/71 120/65 122/70  Pulse: 88 83 95   Resp: (!) 23 (!) 24    Temp: (!) 97.5 F (36.4 C) 98.2 F (36.8 C) 98.1 F (36.7 C) 98.4 F (36.9 C)  TempSrc: Oral Oral Oral Oral  SpO2: 99% 98% 97% 93%  Weight:      Height:        Intake/Output Summary (Last 24 hours) at 05/15/2023 1826 Last data filed at 05/15/2023 1000 Gross per 24 hour  Intake 1000 ml  Output 500 ml  Net 500 ml   Filed Weights   05/14/23 1626  Weight: 81.2 kg    Examination:  General exam: Appears calm and comfortable.  Patient  is obese.  Cushingoid facies. Respiratory system: Clear to auscultation.  Cardiovascular system: S1 & S2 heard. Gastrointestinal system: Abdomen is obese, soft and nontender.   Central nervous system: Awake and alert.  Patient was all extremities.     Data Reviewed: I have personally reviewed following labs and imaging  studies  CBC: Recent Labs  Lab 05/14/23 1749 05/15/23 0146  WBC 13.1* 9.3  NEUTROABS 11.7*  --   HGB 11.7* 11.7*  HCT 33.0* 34.1*  MCV 94.3 93.9  PLT 188 162   Basic Metabolic Panel: Recent Labs  Lab 05/14/23 1749 05/15/23 0146  NA 130* 133*  K 4.5 3.3*  CL 100 103  CO2 21* 18*  GLUCOSE 407* 285*  BUN 33* 25*  CREATININE 0.69 0.68  CALCIUM 10.4* 9.6  PHOS  --  2.9   GFR: Estimated Creatinine Clearance: 78.4 mL/min (by C-G formula based on SCr of 0.68 mg/dL). Liver Function Tests: Recent Labs  Lab 05/14/23 1749 05/15/23 0146  AST 26 21  ALT 42 39  ALKPHOS 74 74  BILITOT 1.5* 1.3*  PROT 6.5 5.9*  ALBUMIN 3.7 3.0*   No results for input(s): "LIPASE", "AMYLASE" in the last 168 hours. No results for input(s): "AMMONIA" in the last 168 hours. Coagulation Profile: Recent Labs  Lab 05/14/23 1930 05/15/23 0146  INR 1.1 1.1   Cardiac Enzymes: No results for input(s): "CKTOTAL", "CKMB", "CKMBINDEX", "TROPONINI" in the last 168 hours. BNP (last 3 results) No results for input(s): "PROBNP" in the last 8760 hours. HbA1C: Recent Labs    05/15/23 0146  HGBA1C 7.6*   CBG: Recent Labs  Lab 05/15/23 0010 05/15/23 0945 05/15/23 1244  GLUCAP 237* 194* 339*   Lipid Profile: No results for input(s): "CHOL", "HDL", "LDLCALC", "TRIG", "CHOLHDL", "LDLDIRECT" in the last 72 hours. Thyroid Function Tests: No results for input(s): "TSH", "T4TOTAL", "FREET4", "T3FREE", "THYROIDAB" in the last 72 hours. Anemia Panel: No results for input(s): "VITAMINB12", "FOLATE", "FERRITIN", "TIBC", "IRON", "RETICCTPCT" in the last 72 hours. Urine analysis: No results found for: "COLORURINE", "APPEARANCEUR", "LABSPEC", "PHURINE", "GLUCOSEU", "HGBUR", "BILIRUBINUR", "KETONESUR", "PROTEINUR", "UROBILINOGEN", "NITRITE", "LEUKOCYTESUR" Sepsis Labs: @LABRCNTIP (procalcitonin:4,lacticidven:4)  ) Recent Results (from the past 240 hour(s))  Resp panel by RT-PCR (RSV, Flu A&B, Covid)  Anterior Nasal Swab     Status: None   Collection Time: 05/14/23  5:50 PM   Specimen: Anterior Nasal Swab  Result Value Ref Range Status   SARS Coronavirus 2 by RT PCR NEGATIVE NEGATIVE Final    Comment: (NOTE) SARS-CoV-2 target nucleic acids are NOT DETECTED.  The SARS-CoV-2 RNA is generally detectable in upper respiratory specimens during the acute phase of infection. The lowest concentration of SARS-CoV-2 viral copies this assay can detect is 138 copies/mL. A negative result does not preclude SARS-Cov-2 infection and should not be used as the sole basis for treatment or other patient management decisions. A negative result may occur with  improper specimen collection/handling, submission of specimen other than nasopharyngeal swab, presence of viral mutation(s) within the areas targeted by this assay, and inadequate number of viral copies(<138 copies/mL). A negative result must be combined with clinical observations, patient history, and epidemiological information. The expected result is Negative.  Fact Sheet for Patients:  BloggerCourse.com  Fact Sheet for Healthcare Providers:  SeriousBroker.it  This test is no t yet approved or cleared by the [place] FDA and  has been authorized for detection and/or diagnosis of SARS-CoV-2 by FDA under an Emergency Use Authorization (EUA). This EUA will remain  in effect (  meaning this test can be used) for the duration of the COVID-19 declaration under Section 564(b)(1) of the Act, 21 U.S.C.section 360bbb-3(b)(1), unless the authorization is terminated  or revoked sooner.       Influenza A by PCR NEGATIVE NEGATIVE Final   Influenza B by PCR NEGATIVE NEGATIVE Final    Comment: (NOTE) The Xpert Xpress SARS-CoV-2/FLU/RSV plus assay is intended as an aid in the diagnosis of influenza from Nasopharyngeal swab specimens and should not be used as a sole basis for treatment. Nasal washings  and aspirates are unacceptable for Xpert Xpress SARS-CoV-2/FLU/RSV testing.  Fact Sheet for Patients: BloggerCourse.com  Fact Sheet for Healthcare Providers: SeriousBroker.it  This test is not yet approved or cleared by the [place] FDA and has been authorized for detection and/or diagnosis of SARS-CoV-2 by FDA under an Emergency Use Authorization (EUA). This EUA will remain in effect (meaning this test can be used) for the duration of the COVID-19 declaration under Section 564(b)(1) of the Act, 21 U.S.C. section 360bbb-3(b)(1), unless the authorization is terminated or revoked.     Resp Syncytial Virus by PCR NEGATIVE NEGATIVE Final    Comment: (NOTE) Fact Sheet for Patients: BloggerCourse.com  Fact Sheet for Healthcare Providers: SeriousBroker.it  This test is not yet approved or cleared by the [place] FDA and has been authorized for detection and/or diagnosis of SARS-CoV-2 by FDA under an Emergency Use Authorization (EUA). This EUA will remain in effect (meaning this test can be used) for the duration of the COVID-19 declaration under Section 564(b)(1) of the Act, 21 U.S.C. section 360bbb-3(b)(1), unless the authorization is terminated or revoked.  Performed at [facility], [address], [place], [place] [address]   Culture, blood (Routine X 2) w Reflex to ID Panel     Status: None (Preliminary result)   Collection Time: 05/15/23  1:42 AM   Specimen: BLOOD LEFT ARM  Result Value Ref Range Status   Specimen Description BLOOD LEFT ARM  Final   Special Requests   Final    BOTTLES DRAWN AEROBIC AND ANAEROBIC Blood Culture adequate volume   Culture   Final    NO GROWTH < 12 HOURS Performed at [facility], [address], [place], [place] [address]    Report Status PENDING  Incomplete  Culture, blood (Routine X 2) w Reflex to ID  Panel     Status: None (Preliminary result)   Collection Time: 05/15/23  1:51 AM   Specimen: BLOOD LEFT ARM  Result Value Ref Range Status   Specimen Description BLOOD LEFT ARM  Final   Special Requests   Final    BOTTLES DRAWN AEROBIC AND ANAEROBIC Blood Culture adequate volume   Culture   Final    NO GROWTH < 12 HOURS Performed at [facility], [address] [place], [place] [address]    Report Status PENDING  Incomplete         Radiology Studies: VAS [place] LOWER EXTREMITY VENOUS (DVT)  Result Date: 05/15/2023  Lower Venous DVT Study Patient Name:  Heather Hudson  Date of Exam:   05/15/2023 Medical Rec #: [id number]      Accession #:    [id number] Date of Birth: September 07, 1956       Patient Gender: F Patient Age:   50 years Exam Location:  [facility] Procedure:      VAS [place] LOWER EXTREMITY VENOUS (DVT) Referring Phys: [facility] GOEL --------------------------------------------------------------------------------  Indications: Pulmonary embolism, and left foot swelling.  Anticoagulation: Heparin. Limitations: Body habitus and poor ultrasound/tissue interface. Comparison Study: No previous study. Performing Technologist: McKayla Maag RVT, [place]  Examination Guidelines: A complete evaluation includes B-mode imaging, spectral Doppler, color Doppler, and power Doppler as needed of all accessible portions of each vessel. Bilateral testing is considered an integral part of a complete examination. Limited examinations for reoccurring indications may be performed as noted. The reflux portion of the exam is performed with the patient in reverse Trendelenburg.  +---------+---------------+---------+-----------+----------+--------------+ RIGHT    CompressibilityPhasicitySpontaneityPropertiesThrombus Aging +---------+---------------+---------+-----------+----------+--------------+ CFV      Full           Yes      Yes                                  +---------+---------------+---------+-----------+----------+--------------+ SFJ      Full                                                        +---------+---------------+---------+-----------+----------+--------------+ FV Prox  Full                                                        +---------+---------------+---------+-----------+----------+--------------+ FV Mid   Full                                                        +---------+---------------+---------+-----------+----------+--------------+ FV DistalFull                                                        +---------+---------------+---------+-----------+----------+--------------+ PFV      Full                                                        +---------+---------------+---------+-----------+----------+--------------+ POP      Partial        Yes      Yes                  Acute          +---------+---------------+---------+-----------+----------+--------------+ PTV      None           No       No                   Acute          +---------+---------------+---------+-----------+----------+--------------+ PERO     None           No       No                   Acute          +---------+---------------+---------+-----------+----------+--------------+  Soleal   None           No       No                   Acute          +---------+---------------+---------+-----------+----------+--------------+ EIV      Full                                                        +---------+---------------+---------+-----------+----------+--------------+ CIV      Full                                                        +---------+---------------+---------+-----------+----------+--------------+   +---------+---------------+---------+-----------+----------+--------------+ LEFT     CompressibilityPhasicitySpontaneityPropertiesThrombus Aging  +---------+---------------+---------+-----------+----------+--------------+ CFV      None           No       No                   Acute          +---------+---------------+---------+-----------+----------+--------------+ SFJ      None           No       No                   Acute          +---------+---------------+---------+-----------+----------+--------------+ FV Prox  None           No       No                   Acute          +---------+---------------+---------+-----------+----------+--------------+ FV Mid   None           No       No                   Acute          +---------+---------------+---------+-----------+----------+--------------+ FV DistalNone           No       No                   Acute          +---------+---------------+---------+-----------+----------+--------------+ PFV      None           No       No                   Acute          +---------+---------------+---------+-----------+----------+--------------+ POP      None           No       No                   Acute          +---------+---------------+---------+-----------+----------+--------------+ PTV      None           No       No  Acute          +---------+---------------+---------+-----------+----------+--------------+ PERO                                                  Not visualized +---------+---------------+---------+-----------+----------+--------------+ Gastroc  None           No       No                   Acute          +---------+---------------+---------+-----------+----------+--------------+ EIV      None           No       No                   Acute          +---------+---------------+---------+-----------+----------+--------------+ CIV                                                   Not visualized +---------+---------------+---------+-----------+----------+--------------+     Summary: RIGHT: - Findings consistent with  acute deep vein thrombosis involving the right popliteal vein, right posterior tibial veins, right peroneal veins, and right soleal veins. The thrombus located in the right popliteal vein appears to be loosely attached to the vessel wall. - No cystic structure found in the popliteal fossa.  LEFT: - Findings consistent with acute deep vein thrombosis involving the left external iliac vein, left common femoral vein, SF junction, left femoral vein, left proximal profunda vein, left popliteal vein, left posterior tibial veins, left peroneal veins, and left gastrocnemius veins. - No cystic structure found in the popliteal fossa.  *See table(s) above for measurements and observations. Electronically signed by Gerarda Fraction on 05/15/2023 at 3:34:50 PM.    Final    ECHOCARDIOGRAM LIMITED  Result Date: 05/15/2023    ECHOCARDIOGRAM LIMITED REPORT   Patient Name:   Heather Hudson Date of Exam: 05/15/2023 Medical Rec #:  [id number]     Height:       68.0 in Accession #:    [id number]    Weight:       179.0 lb Date of Birth:  04/29/1956      BSA:          1.950 m Patient Age:    66 years      BP:           122/61 mmHg Patient Gender: F             HR:           84 bpm. Exam Location:  Inpatient Procedure: Limited Echo, Limited Color Doppler and Cardiac Doppler Indications:    Pulmonary Embolus  History:        Patient has prior history of Echocardiogram examinations, most                 recent 04/20/2023. Signs/Symptoms:Fatigue and Shortness of                 Breath; Risk Factors:Hypertension and Dyslipidemia.  Sonographer:    Wallie Char Referring Phys: Melody Comas, B IMPRESSIONS  1. Right ventricular systolic function is mildly reduced. The right ventricular size is normal. There is  moderately elevated pulmonary artery systolic pressure. The estimated right ventricular systolic pressure is 49.5 mmHg.  2. Left ventricular ejection fraction, by estimation, is 60 to 65%. The left ventricle has normal function. The left  ventricle has no regional wall motion abnormalities.  3. The mitral valve is grossly normal. Trivial mitral valve regurgitation. No evidence of mitral stenosis.  4. The aortic valve is tricuspid. Aortic valve regurgitation is trivial. No aortic stenosis is present.  5. The inferior vena cava is normal in size with greater than 50% respiratory variability, suggesting right atrial pressure of 3 mmHg. Comparison(s): Changes from prior study are noted. LV function unchanged. RV function and size unchanged. RVSP elevated up to 49 mmHG on this study. FINDINGS  Left Ventricle: Left ventricular ejection fraction, by estimation, is 60 to 65%. The left ventricle has normal function. The left ventricle has no regional wall motion abnormalities. The left ventricular internal cavity size was normal in size. There is  no left ventricular hypertrophy. Right Ventricle: The right ventricular size is normal. No increase in right ventricular wall thickness. Right ventricular systolic function is mildly reduced. There is moderately elevated pulmonary artery systolic pressure. The tricuspid regurgitant velocity is 3.41 m/s, and with an assumed right atrial pressure of 3 mmHg, the estimated right ventricular systolic pressure is 49.5 mmHg. Left Atrium: Left atrial size was normal in size. Right Atrium: Right atrial size was normal in size. Pericardium: There is no evidence of pericardial effusion. Presence of epicardial fat layer. Mitral Valve: The mitral valve is grossly normal. Trivial mitral valve regurgitation. No evidence of mitral valve stenosis. MV peak gradient, 4.8 mmHg. The mean mitral valve gradient is 2.0 mmHg. Tricuspid Valve: The tricuspid valve is grossly normal. Tricuspid valve regurgitation is mild . No evidence of tricuspid stenosis. Aortic Valve: The aortic valve is tricuspid. Aortic valve regurgitation is trivial. No aortic stenosis is present. Aortic valve mean gradient measures 5.0 mmHg. Aortic valve peak gradient  measures 8.9 mmHg. Aortic valve area, by VTI measures 2.14 cm. Aorta: The aortic root is normal in size and structure. Venous: The inferior vena cava is normal in size with greater than 50% respiratory variability, suggesting right atrial pressure of 3 mmHg. IAS/Shunts: The atrial septum is grossly normal. Additional Comments: Spectral Doppler performed. Color Doppler performed.  LEFT VENTRICLE PLAX 2D LVIDd:         4.90 cm LVIDs:         3.20 cm LV PW:         1.00 cm LV IVS:        1.00 cm LVOT diam:     1.80 cm LV SV:         54 LV SV Index:   28 LVOT Area:     2.54 cm  LV Volumes (MOD) LV vol d, MOD A2C: 77.8 ml LV vol d, MOD A4C: 79.9 ml LV vol s, MOD A2C: 27.4 ml LV vol s, MOD A4C: 28.8 ml LV SV MOD A2C:     50.4 ml LV SV MOD A4C:     79.9 ml LV SV MOD BP:      51.1 ml RIGHT VENTRICLE             IVC RV Basal diam:  3.50 cm     IVC diam: 1.30 cm RV S prime:     11.50 cm/s TAPSE (M-mode): 1.5 cm LEFT ATRIUM         Index       RIGHT  ATRIUM           Index LA diam:    3.00 cm 1.54 cm/m  RA Area:     11.90 cm                                 RA Volume:   23.60 ml  12.10 ml/m  AORTIC VALVE AV Area (Vmax):    2.15 cm AV Area (Vmean):   1.95 cm AV Area (VTI):     2.14 cm AV Vmax:           149.00 cm/s AV Vmean:          111.000 cm/s AV VTI:            0.254 m AV Peak Grad:      8.9 mmHg AV Mean Grad:      5.0 mmHg LVOT Vmax:         126.00 cm/s LVOT Vmean:        84.900 cm/s LVOT VTI:          0.214 m LVOT/AV VTI ratio: 0.84  AORTA Ao Root diam: 3.40 cm MITRAL VALVE               TRICUSPID VALVE MV Area (PHT): 3.34 cm    TR Peak grad:   46.5 mmHg MV Area VTI:   2.35 cm    TR Vmax:        341.00 cm/s MV Peak grad:  4.8 mmHg MV Mean grad:  2.0 mmHg    SHUNTS MV Vmax:       1.09 m/s    Systemic VTI:  0.21 m MV Vmean:      56.7 cm/s   Systemic Diam: 1.80 cm MV Decel Time: 227 msec MV E velocity: 71.80 cm/s MV A velocity: 95.20 cm/s MV E/A ratio:  0.75 Lennie Odor MD Electronically signed by Lennie Odor MD  Signature Date/Time: 05/15/2023/1:58:47 PM    Final    CT Angio Chest PE W and/or Wo Contrast  Result Date: 05/14/2023 CLINICAL DATA:  Pulmonary embolism (PE) suspected, high prob EXAM: CT ANGIOGRAPHY CHEST WITH CONTRAST TECHNIQUE: Multidetector CT imaging of the chest was performed using the standard protocol during bolus administration of intravenous contrast. Multiplanar CT image reconstructions and MIPs were obtained to evaluate the vascular anatomy. RADIATION DOSE REDUCTION: This exam was performed according to the departmental dose-optimization program which includes automated exposure control, adjustment of the mA and/or kV according to patient size and/or use of iterative reconstruction technique. CONTRAST:  75mL OMNIPAQUE IOHEXOL 350 MG/ML SOLN COMPARISON:  05/05/2023 FINDINGS: Cardiovascular: Satisfactory opacification of the pulmonary arteries. Extensive bilateral pulmonary emboli involving both main pulmonary arteries, right greater than left. Thrombus involves numerous lobar, segmental, and subsegmental branch pulmonary arteries bilaterally. No saddle embolism. Mildly elevated RV to LV ratio of 1.05. Heart size is normal. No pericardial effusion. Thoracic aorta is nonaneurysmal. Mediastinum/Nodes: No enlarged mediastinal, hilar, or axillary lymph nodes. Thyroid gland, trachea, and esophagus demonstrate no significant findings. Lungs/Pleura: Wedge-shaped airspace consolidation with air bronchograms within the right middle lobe. Additional smaller areas of airspace consolidation within the periphery of the right upper lobe and superior aspect of the right lower lobe. Bibasilar atelectasis. No pleural effusion or pneumothorax. Upper Abdomen: No acute abnormality. Musculoskeletal: No chest wall abnormality. No acute or significant osseous findings. Review of the MIP images confirms the above findings. IMPRESSION: 1. Positive for acute bilateral PE  with CT evidence of right heart strain (RV/LV Ratio = 1.05)  consistent with at least submassive (intermediate risk) PE. The presence of right heart strain has been associated with an increased risk of morbidity and mortality. Please refer to the "Code PE Focused" order set in EPIC. 2. Wedge-shaped airspace consolidation within the right middle lobe with additional smaller areas of airspace consolidation within the periphery of the right upper lobe and superior aspect of the right lower lobe. Findings are most concerning for pulmonary infarcts. Multifocal pneumonia could also result in this appearance in the appropriate clinical setting. Critical Value/emergent results were called by telephone at the time of interpretation on 05/14/2023 at 7:10 pm to provider Dr. Tanda Rockers , who verbally acknowledged these results. Electronically Signed   By: Duanne Guess D.O.   On: 05/14/2023 19:10        Scheduled Meds:  folic acid  1 mg Oral Daily   insulin aspart  0-15 Units Subcutaneous TID WC   insulin aspart  0-5 Units Subcutaneous QHS   losartan  25 mg Oral Daily   [START ON 05/17/2023] methotrexate  10 mg Oral Weekly   predniSONE  20 mg Oral Q breakfast   sodium chloride flush  3 mL Intravenous Q12H   Continuous Infusions:  sodium chloride 75 mL/hr at 05/15/23 1704   ampicillin-sulbactam (UNASYN) IV 3 g (05/15/23 1126)   azithromycin 500 mg (05/15/23 0105)   heparin 1,300 Units/hr (05/15/23 1420)     LOS: 1 day    Time spent: 55 minutes.    Berton Mount, MD  [facility] Pager #: [phone] 7PM-7AM contact night coverage as above

## 2023-05-15 NOTE — Assessment & Plan Note (Addendum)
Without any hemodynamic instability.  However associated pulmonary infarcts as well as associated borderline questionable hypoxemia.  Patient being treated with supplementary oxygen with plan to check orthostatic in the morning as well as ambulatory pulse oximetry.  Patient has been started on heparin infusion, we will monitor patient's clinical evolution on this.  I advised patient to report any new symptoms including but not limited to headache, new back pain any neurologic symptoms on the lower extremity such as tingling or weakness or bladder or bowel changes or any black liquid stool to the staff right away. Check lower extremity dopplers.  Patient is noted to have leukocytosis, sinus tachycardia, and consolidation on CAT scan.  This is likely due to concern infarction of the lung.  However patient is immunocompromise getting steroid therapy as well as methotrexate.  At this time therefore I will check blood cultures and start empiric treatment for pneumonia

## 2023-05-15 NOTE — Progress Notes (Signed)
ANTICOAGULATION CONSULT NOTE  Pharmacy Consult for Heparin Indication: pulmonary embolus Brief A/P: Heparin level within goal range Continue Heparin at current rate   Allergies  Allergen Reactions   Aspirin Hives   Ibuprofen Hives   Other     Anesthesia (familial) SISTER HAD MALIGNANT HYPERTHERMIA   Propofol Other (See Comments)    Patient Measurements: Height: 5\' 8"  (172.7 cm) Weight: 81.2 kg (179 lb 0.2 oz) IBW/kg (Calculated) : 63.9 Heparin Dosing Weight: 80.3 kg  Vital Signs: Temp: 97.5 F (36.4 C) (08/09 0225) Temp Source: Oral (08/09 0225) BP: 120/65 (08/09 0225) Pulse Rate: 88 (08/09 0225)  Labs: Recent Labs    05/14/23 1749 05/14/23 1930 05/15/23 0146  HGB 11.7*  --  11.7*  HCT 33.0*  --  34.1*  PLT 188  --  162  APTT  --  32 65*  LABPROT  --  14.2 14.8  INR  --  1.1 1.1  HEPARINUNFRC  --   --  0.41  CREATININE 0.69  --  0.68  TROPONINIHS 15 13  --     Estimated Creatinine Clearance: 78.4 mL/min (by C-G formula based on SCr of 0.68 mg/dL).   Assessment: 67 y.o. female with PE for heparin  Goal of Therapy:  Heparin level 0.3-0.7 units/ml Monitor platelets by anticoagulation protocol: Yes   Plan:  No change to heparin  Check heparin level in 6 hours to verify  Geannie Risen, PharmD, BCPS

## 2023-05-15 NOTE — Progress Notes (Signed)
ANTICOAGULATION CONSULT NOTE  Pharmacy Consult for Heparin Indication: pulmonary embolus   Allergies  Allergen Reactions   Aspirin Hives   Ibuprofen Hives   Other     Anesthesia (familial) SISTER HAD MALIGNANT HYPERTHERMIA   Propofol Other (See Comments)    Patient Measurements: Height: 5\' 8"  (172.7 cm) Weight: 81.2 kg (179 lb 0.2 oz) IBW/kg (Calculated) : 63.9 Heparin Dosing Weight: 80.3 kg  Vital Signs: Temp: 98.2 F (36.8 C) (08/09 0758) Temp Source: Oral (08/09 0758) BP: 122/71 (08/09 0758) Pulse Rate: 83 (08/09 0758)  Labs: Recent Labs    05/14/23 1749 05/14/23 1930 05/15/23 0146 05/15/23 1101  HGB 11.7*  --  11.7*  --   HCT 33.0*  --  34.1*  --   PLT 188  --  162  --   APTT  --  32 65*  --   LABPROT  --  14.2 14.8  --   INR  --  1.1 1.1  --   HEPARINUNFRC  --   --  0.41 0.31  CREATININE 0.69  --  0.68  --   TROPONINIHS 15 13  --   --     Estimated Creatinine Clearance: 78.4 mL/min (by C-G formula based on SCr of 0.68 mg/dL).   Assessment: 67 y.o. female presented 8/8 with bilateral PE with right heart strain and bilateral lower extremity DVTs.  Patient reports family history of clotting disorder (father).  PCCM consulted for possible lytics in the event of hemodynamic instability but stable thus far.  Pharmacy consulted for heparin dosing.  Confirmatory heparin level 0.31 therapeutic @1300  units/hr.  No s/sx of bleeding or IV issues.  Goal of Therapy:  Heparin level 0.3-0.7 units/ml Monitor platelets by anticoagulation protocol: Yes   Plan:  Continue heparin 1300 units/hr Monitor daily anti-Xa levels, CBC, s/sx bleeding F/u transition to DOAC  Trixie Rude, PharmD Clinical Pharmacist 05/15/2023  12:32 PM  Please check AMION for all [facility] phone numbers After 10:00 PM, call Main Pharmacy [phone]

## 2023-05-15 NOTE — TOC Benefit Eligibility Note (Signed)
Patient Product/process development scientist completed.    The patient is insured through [employer]/ [employer]. Patient has Medicare and is not eligible for a copay card, but may be able to apply for patient assistance, if available.    Ran test claim for Eliquis 5 mg and the current 30 day co-pay is $47.00.  Ran test claim for Xarelto 20 mg and the current 30 day co-pay is $47.00.  This test claim was processed through [facility]- copay amounts may vary at other pharmacies due to pharmacy/plan contracts, or as the patient moves through the different stages of their insurance plan.     Roland Earl, CPHT Pharmacy Patient Advocate Specialist [facility] Patient Advocate Team Direct Number: [phone]  Fax: [phone]

## 2023-05-15 NOTE — Progress Notes (Signed)
Bilateral lower extremity venous study completed.   Preliminary results relayed to MD and RN.  Please see CV Procedures for preliminary results.   , RVT  9:13 AM 05/15/23

## 2023-05-16 DIAGNOSIS — I2601 Septic pulmonary embolism with acute cor pulmonale: Secondary | ICD-10-CM | POA: Diagnosis not present

## 2023-05-16 LAB — GLUCOSE, CAPILLARY
Glucose-Capillary: 126 mg/dL — ABNORMAL HIGH (ref 70–99)
Glucose-Capillary: 162 mg/dL — ABNORMAL HIGH (ref 70–99)
Glucose-Capillary: 183 mg/dL — ABNORMAL HIGH (ref 70–99)
Glucose-Capillary: 209 mg/dL — ABNORMAL HIGH (ref 70–99)
Glucose-Capillary: 246 mg/dL — ABNORMAL HIGH (ref 70–99)
Glucose-Capillary: 270 mg/dL — ABNORMAL HIGH (ref 70–99)

## 2023-05-16 LAB — CBC
HCT: 27.7 % — ABNORMAL LOW (ref 36.0–46.0)
Hemoglobin: 9.2 g/dL — ABNORMAL LOW (ref 12.0–15.0)
MCH: 33.3 pg (ref 26.0–34.0)
MCHC: 33.2 g/dL (ref 30.0–36.0)
MCV: 100.4 fL — ABNORMAL HIGH (ref 80.0–100.0)
Platelets: 180 K/uL (ref 150–400)
RBC: 2.76 MIL/uL — ABNORMAL LOW (ref 3.87–5.11)
RDW: 15.8 % — ABNORMAL HIGH (ref 11.5–15.5)
WBC: 6.4 K/uL (ref 4.0–10.5)
nRBC: 0 % (ref 0.0–0.2)

## 2023-05-16 LAB — HEPARIN LEVEL (UNFRACTIONATED): Heparin Unfractionated: 0.4 [IU]/mL (ref 0.30–0.70)

## 2023-05-16 NOTE — Progress Notes (Signed)
PROGRESS NOTE    Heather Hudson  ZOX:[id number] DOB: 1955/10/31 DOA: 05/14/2023 PCP: [name], NP  Outpatient Specialists:     Brief Narrative:  Patient is a 67 year old female past medical history significant for COVID-19 infection, autoimmune disease/mucocutaneous pemphigoid and remote colovesical fistula.  Patient has been on prednisone on methotrexate.  Patient was admitted with acute pulmonary embolism and bilateral lower extremity DVT.  History of travel to [place] reported.  Patient is currently on heparin.  05/16/2023: Patient seen.  Procalcitonin is less than 0.1.  Will discontinue antibiotics.  Patient was seen alongside patient's husband.  All were updated.  Continue heparin for now.  Likely will continue heparin for the next 24 to 48 hours.  Assessment & Plan:   Principal Problem:   Acute pulmonary embolism (HCC) Active Problems:   Primary hypertension   Mucous membrane pemphigoid   Pulmonary embolism (HCC)   Hyperglycemia   Hypercalcemia   Acute pulmonary embolism (HCC) -Without any hemodynamic instability.  However associated pulmonary infarcts as well as associated borderline questionable hypoxemia.  Patient being treated with supplementary oxygen with plan to check orthostatic in the morning as well as ambulatory pulse oximetry.  Patient has been started on heparin infusion, we will monitor patient's clinical evolution on this.  I advised patient to report any new symptoms including but not limited to headache, new back pain any neurologic symptoms on the lower extremity such as tingling or weakness or bladder or bowel changes or any black liquid stool to the staff right away. Check lower extremity dopplers.   Patient is noted to have leukocytosis, sinus tachycardia, and consolidation on CAT scan.  This is likely due to concern infarction of the lung.  However patient is immunocompromise getting steroid therapy as well as methotrexate.  At this time therefore I  will check blood cultures and start empiric treatment for pneumonia 05/15/2023: Continue heparin.  Likely follow-up with hematology team on discharge.  Incentive spirometry. 05/16/2023: Continue heparin.   Hypercalcemia Very mild, incidental.  I will check PTH, vitamin D Phos morning. Noted on April 16th as well. 05/15/2023: Resolved.   Hyperglycemia This is likely been precipitated by patient being started on steroid therapy recently.  See below.  I will maintain the patient on insulin sliding scale at this time. 05/15/2023: Add Semglee.  A1c 7.6%.  Consult diabetic educator.  Patient has been on steroids.   Mucous membrane pemphigoid Heather Massed, MD - 04/23/2023 8:45 AM EDT Formatting of this note is different from the original. Images from the original note were not included. Dermatology Note  05/15/2023: Hematology team is managing.   DVT prophylaxis: Heparin. Code Status: Full code. Family Communication: Sister. Disposition Plan: Home eventually.  Remains inpatient.   Consultants:  None for now. Follow-up with hematology on discharge.  Procedures:  None.  Antimicrobials:  IV Unasyn.  Have a low threshold to discontinue antibiotics.  Check procalcitonin.   Subjective: -No fever or chills. -No shortness of breath.  Objective: Vitals:   05/16/23 0406 05/16/23 0812 05/16/23 1050 05/16/23 1505  BP: (!) 142/85 124/76 134/72 (!) 145/88  Pulse: 79 74 81 (!) 109  Resp: 18 18 18 20   Temp: (!) 97.5 F (36.4 C)  97.6 F (36.4 C) 98.1 F (36.7 C)  TempSrc: Oral  Oral Oral  SpO2: 95% 96% 97% 96%  Weight:      Height:        Intake/Output Summary (Last 24 hours) at 05/16/2023 1651 Last data filed at 05/16/2023  1215 Gross per 24 hour  Intake 3726.39 ml  Output 1200 ml  Net 2526.39 ml   Filed Weights   05/14/23 1626  Weight: 81.2 kg    Examination:  General exam: Appears calm and comfortable.  Patient is obese.  Cushingoid facies. Respiratory system: Clear to  auscultation.  Cardiovascular system: S1 & S2 heard. Gastrointestinal system: Abdomen is obese, soft and nontender.   Central nervous system: Awake and alert.  Patient was all extremities.     Data Reviewed: I have personally reviewed following labs and imaging studies  CBC: Recent Labs  Lab 05/14/23 1749 05/15/23 0146 05/16/23 0413  WBC 13.1* 9.3 6.4  NEUTROABS 11.7*  --   --   HGB 11.7* 11.7* 9.2*  HCT 33.0* 34.1* 27.7*  MCV 94.3 93.9 100.4*  PLT 188 162 180   Basic Metabolic Panel: Recent Labs  Lab 05/14/23 1749 05/15/23 0146  NA 130* 133*  K 4.5 3.3*  CL 100 103  CO2 21* 18*  GLUCOSE 407* 285*  BUN 33* 25*  CREATININE 0.69 0.68  CALCIUM 10.4* 9.6  PHOS  --  2.9   GFR: Estimated Creatinine Clearance: 78.4 mL/min (by C-G formula based on SCr of 0.68 mg/dL). Liver Function Tests: Recent Labs  Lab 05/14/23 1749 05/15/23 0146  AST 26 21  ALT 42 39  ALKPHOS 74 74  BILITOT 1.5* 1.3*  PROT 6.5 5.9*  ALBUMIN 3.7 3.0*   No results for input(s): "LIPASE", "AMYLASE" in the last 168 hours. No results for input(s): "AMMONIA" in the last 168 hours. Coagulation Profile: Recent Labs  Lab 05/14/23 1930 05/15/23 0146  INR 1.1 1.1   Cardiac Enzymes: No results for input(s): "CKTOTAL", "CKMB", "CKMBINDEX", "TROPONINI" in the last 168 hours. BNP (last 3 results) No results for input(s): "PROBNP" in the last 8760 hours. HbA1C: Recent Labs    05/15/23 0146  HGBA1C 7.6*   CBG: Recent Labs  Lab 05/15/23 2021 05/16/23 0008 05/16/23 0409 05/16/23 0638 05/16/23 1221  GLUCAP 266* 246* 162* 126* 183*   Lipid Profile: No results for input(s): "CHOL", "HDL", "LDLCALC", "TRIG", "CHOLHDL", "LDLDIRECT" in the last 72 hours. Thyroid Function Tests: No results for input(s): "TSH", "T4TOTAL", "FREET4", "T3FREE", "THYROIDAB" in the last 72 hours. Anemia Panel: No results for input(s): "VITAMINB12", "FOLATE", "FERRITIN", "TIBC", "IRON", "RETICCTPCT" in the last 72  hours. Urine analysis: No results found for: "COLORURINE", "APPEARANCEUR", "LABSPEC", "PHURINE", "GLUCOSEU", "HGBUR", "BILIRUBINUR", "KETONESUR", "PROTEINUR", "UROBILINOGEN", "NITRITE", "LEUKOCYTESUR" Sepsis Labs: @LABRCNTIP (procalcitonin:4,lacticidven:4)  ) Recent Results (from the past 240 hour(s))  Resp panel by RT-PCR (RSV, Flu A&B, Covid) Anterior Nasal Swab     Status: None   Collection Time: 05/14/23  5:50 PM   Specimen: Anterior Nasal Swab  Result Value Ref Range Status   SARS Coronavirus 2 by RT PCR NEGATIVE NEGATIVE Final    Comment: (NOTE) SARS-CoV-2 target nucleic acids are NOT DETECTED.  The SARS-CoV-2 RNA is generally detectable in upper respiratory specimens during the acute phase of infection. The lowest concentration of SARS-CoV-2 viral copies this assay can detect is 138 copies/mL. A negative result does not preclude SARS-Cov-2 infection and should not be used as the sole basis for treatment or other patient management decisions. A negative result may occur with  improper specimen collection/handling, submission of specimen other than nasopharyngeal swab, presence of viral mutation(s) within the areas targeted by this assay, and inadequate number of viral copies(<138 copies/mL). A negative result must be combined with clinical observations, patient history, and epidemiological information. The expected result  is Negative.  Fact Sheet for Patients:  BloggerCourse.com  Fact Sheet for Healthcare Providers:  SeriousBroker.it  This test is no t yet approved or cleared by the [place] FDA and  has been authorized for detection and/or diagnosis of SARS-CoV-2 by FDA under an Emergency Use Authorization (EUA). This EUA will remain  in effect (meaning this test can be used) for the duration of the COVID-19 declaration under Section 564(b)(1) of the Act, 21 U.S.C.section 360bbb-3(b)(1), unless the authorization is  terminated  or revoked sooner.       Influenza A by PCR NEGATIVE NEGATIVE Final   Influenza B by PCR NEGATIVE NEGATIVE Final    Comment: (NOTE) The Xpert Xpress SARS-CoV-2/FLU/RSV plus assay is intended as an aid in the diagnosis of influenza from Nasopharyngeal swab specimens and should not be used as a sole basis for treatment. Nasal washings and aspirates are unacceptable for Xpert Xpress SARS-CoV-2/FLU/RSV testing.  Fact Sheet for Patients: BloggerCourse.com  Fact Sheet for Healthcare Providers: SeriousBroker.it  This test is not yet approved or cleared by the [place] FDA and has been authorized for detection and/or diagnosis of SARS-CoV-2 by FDA under an Emergency Use Authorization (EUA). This EUA will remain in effect (meaning this test can be used) for the duration of the COVID-19 declaration under Section 564(b)(1) of the Act, 21 U.S.C. section 360bbb-3(b)(1), unless the authorization is terminated or revoked.     Resp Syncytial Virus by PCR NEGATIVE NEGATIVE Final    Comment: (NOTE) Fact Sheet for Patients: BloggerCourse.com  Fact Sheet for Healthcare Providers: SeriousBroker.it  This test is not yet approved or cleared by the [place] FDA and has been authorized for detection and/or diagnosis of SARS-CoV-2 by FDA under an Emergency Use Authorization (EUA). This EUA will remain in effect (meaning this test can be used) for the duration of the COVID-19 declaration under Section 564(b)(1) of the Act, 21 U.S.C. section 360bbb-3(b)(1), unless the authorization is terminated or revoked.  Performed at [facility], [address] [place], [place] [address]   Culture, blood (Routine X 2) w Reflex to ID Panel     Status: None (Preliminary result)   Collection Time: 05/15/23  1:42 AM   Specimen: BLOOD LEFT ARM  Result Value Ref Range  Status   Specimen Description BLOOD LEFT ARM  Final   Special Requests   Final    BOTTLES DRAWN AEROBIC AND ANAEROBIC Blood Culture adequate volume   Culture   Final    NO GROWTH 1 DAY Performed at [facility], [address] [place], [place] [address]    Report Status PENDING  Incomplete  Culture, blood (Routine X 2) w Reflex to ID Panel     Status: None (Preliminary result)   Collection Time: 05/15/23  1:51 AM   Specimen: BLOOD LEFT ARM  Result Value Ref Range Status   Specimen Description BLOOD LEFT ARM  Final   Special Requests   Final    BOTTLES DRAWN AEROBIC AND ANAEROBIC Blood Culture adequate volume   Culture   Final    NO GROWTH 1 DAY Performed at [facility], [address], [place], [place] [address]    Report Status PENDING  Incomplete         Radiology Studies: VAS [place] LOWER EXTREMITY VENOUS (DVT)  Result Date: 05/15/2023  Lower Venous DVT Study Patient Name:  LUDIVINA AMUNDSON  Date of Exam:   05/15/2023 Medical Rec #: [id number]  Accession #:    [id number] Date of Birth: 15-May-1956       Patient Gender: F Patient Age:   70 years Exam Location:  [facility] Procedure:      VAS [place] LOWER EXTREMITY VENOUS (DVT) Referring Phys: [facility] GOEL --------------------------------------------------------------------------------  Indications: Pulmonary embolism, and left foot swelling.  Anticoagulation: Heparin. Limitations: Body habitus and poor ultrasound/tissue interface. Comparison Study: No previous study. Performing Technologist: McKayla Maag RVT, [place]  Examination Guidelines: A complete evaluation includes B-mode imaging, spectral Doppler, color Doppler, and power Doppler as needed of all accessible portions of each vessel. Bilateral testing is considered an integral part of a complete examination. Limited examinations for reoccurring indications may be performed as noted. The reflux portion of the exam is performed with the patient in reverse Trendelenburg.   +---------+---------------+---------+-----------+----------+--------------+ RIGHT    CompressibilityPhasicitySpontaneityPropertiesThrombus Aging +---------+---------------+---------+-----------+----------+--------------+ CFV      Full           Yes      Yes                                 +---------+---------------+---------+-----------+----------+--------------+ SFJ      Full                                                        +---------+---------------+---------+-----------+----------+--------------+ FV Prox  Full                                                        +---------+---------------+---------+-----------+----------+--------------+ FV Mid   Full                                                        +---------+---------------+---------+-----------+----------+--------------+ FV DistalFull                                                        +---------+---------------+---------+-----------+----------+--------------+ PFV      Full                                                        +---------+---------------+---------+-----------+----------+--------------+ POP      Partial        Yes      Yes                  Acute          +---------+---------------+---------+-----------+----------+--------------+ PTV      None           No       No  Acute          +---------+---------------+---------+-----------+----------+--------------+ PERO     None           No       No                   Acute          +---------+---------------+---------+-----------+----------+--------------+ Soleal   None           No       No                   Acute          +---------+---------------+---------+-----------+----------+--------------+ EIV      Full                                                        +---------+---------------+---------+-----------+----------+--------------+ CIV      Full                                                         +---------+---------------+---------+-----------+----------+--------------+   +---------+---------------+---------+-----------+----------+--------------+ LEFT     CompressibilityPhasicitySpontaneityPropertiesThrombus Aging +---------+---------------+---------+-----------+----------+--------------+ CFV      None           No       No                   Acute          +---------+---------------+---------+-----------+----------+--------------+ SFJ      None           No       No                   Acute          +---------+---------------+---------+-----------+----------+--------------+ FV Prox  None           No       No                   Acute          +---------+---------------+---------+-----------+----------+--------------+ FV Mid   None           No       No                   Acute          +---------+---------------+---------+-----------+----------+--------------+ FV DistalNone           No       No                   Acute          +---------+---------------+---------+-----------+----------+--------------+ PFV      None           No       No                   Acute          +---------+---------------+---------+-----------+----------+--------------+ POP      None           No       No  Acute          +---------+---------------+---------+-----------+----------+--------------+ PTV      None           No       No                   Acute          +---------+---------------+---------+-----------+----------+--------------+ PERO                                                  Not visualized +---------+---------------+---------+-----------+----------+--------------+ Gastroc  None           No       No                   Acute          +---------+---------------+---------+-----------+----------+--------------+ EIV      None           No       No                   Acute           +---------+---------------+---------+-----------+----------+--------------+ CIV                                                   Not visualized +---------+---------------+---------+-----------+----------+--------------+     Summary: RIGHT: - Findings consistent with acute deep vein thrombosis involving the right popliteal vein, right posterior tibial veins, right peroneal veins, and right soleal veins. The thrombus located in the right popliteal vein appears to be loosely attached to the vessel wall. - No cystic structure found in the popliteal fossa.  LEFT: - Findings consistent with acute deep vein thrombosis involving the left external iliac vein, left common femoral vein, SF junction, left femoral vein, left proximal profunda vein, left popliteal vein, left posterior tibial veins, left peroneal veins, and left gastrocnemius veins. - No cystic structure found in the popliteal fossa.  *See table(s) above for measurements and observations. Electronically signed by Gerarda Fraction on 05/15/2023 at 3:34:50 PM.    Final    ECHOCARDIOGRAM LIMITED  Result Date: 05/15/2023    ECHOCARDIOGRAM LIMITED REPORT   Patient Name:   KYOMI FRUSH Date of Exam: 05/15/2023 Medical Rec #:  [id number]     Height:       68.0 in Accession #:    [id number]    Weight:       179.0 lb Date of Birth:  June 22, 1956      BSA:          1.950 m Patient Age:    66 years      BP:           122/61 mmHg Patient Gender: F             HR:           84 bpm. Exam Location:  Inpatient Procedure: Limited Echo, Limited Color Doppler and Cardiac Doppler Indications:    Pulmonary Embolus  History:        Patient has prior history of Echocardiogram examinations, most                 recent 04/20/2023. Signs/Symptoms:Fatigue  and Shortness of                 Breath; Risk Factors:Hypertension and Dyslipidemia.  Sonographer:    Wallie Char Referring Phys: Melody Comas, B IMPRESSIONS  1. Right ventricular systolic function is mildly reduced. The right  ventricular size is normal. There is moderately elevated pulmonary artery systolic pressure. The estimated right ventricular systolic pressure is 49.5 mmHg.  2. Left ventricular ejection fraction, by estimation, is 60 to 65%. The left ventricle has normal function. The left ventricle has no regional wall motion abnormalities.  3. The mitral valve is grossly normal. Trivial mitral valve regurgitation. No evidence of mitral stenosis.  4. The aortic valve is tricuspid. Aortic valve regurgitation is trivial. No aortic stenosis is present.  5. The inferior vena cava is normal in size with greater than 50% respiratory variability, suggesting right atrial pressure of 3 mmHg. Comparison(s): Changes from prior study are noted. LV function unchanged. RV function and size unchanged. RVSP elevated up to 49 mmHG on this study. FINDINGS  Left Ventricle: Left ventricular ejection fraction, by estimation, is 60 to 65%. The left ventricle has normal function. The left ventricle has no regional wall motion abnormalities. The left ventricular internal cavity size was normal in size. There is  no left ventricular hypertrophy. Right Ventricle: The right ventricular size is normal. No increase in right ventricular wall thickness. Right ventricular systolic function is mildly reduced. There is moderately elevated pulmonary artery systolic pressure. The tricuspid regurgitant velocity is 3.41 m/s, and with an assumed right atrial pressure of 3 mmHg, the estimated right ventricular systolic pressure is 49.5 mmHg. Left Atrium: Left atrial size was normal in size. Right Atrium: Right atrial size was normal in size. Pericardium: There is no evidence of pericardial effusion. Presence of epicardial fat layer. Mitral Valve: The mitral valve is grossly normal. Trivial mitral valve regurgitation. No evidence of mitral valve stenosis. MV peak gradient, 4.8 mmHg. The mean mitral valve gradient is 2.0 mmHg. Tricuspid Valve: The tricuspid valve is  grossly normal. Tricuspid valve regurgitation is mild . No evidence of tricuspid stenosis. Aortic Valve: The aortic valve is tricuspid. Aortic valve regurgitation is trivial. No aortic stenosis is present. Aortic valve mean gradient measures 5.0 mmHg. Aortic valve peak gradient measures 8.9 mmHg. Aortic valve area, by VTI measures 2.14 cm. Aorta: The aortic root is normal in size and structure. Venous: The inferior vena cava is normal in size with greater than 50% respiratory variability, suggesting right atrial pressure of 3 mmHg. IAS/Shunts: The atrial septum is grossly normal. Additional Comments: Spectral Doppler performed. Color Doppler performed.  LEFT VENTRICLE PLAX 2D LVIDd:         4.90 cm LVIDs:         3.20 cm LV PW:         1.00 cm LV IVS:        1.00 cm LVOT diam:     1.80 cm LV SV:         54 LV SV Index:   28 LVOT Area:     2.54 cm  LV Volumes (MOD) LV vol d, MOD A2C: 77.8 ml LV vol d, MOD A4C: 79.9 ml LV vol s, MOD A2C: 27.4 ml LV vol s, MOD A4C: 28.8 ml LV SV MOD A2C:     50.4 ml LV SV MOD A4C:     79.9 ml LV SV MOD BP:      51.1 ml RIGHT VENTRICLE  IVC RV Basal diam:  3.50 cm     IVC diam: 1.30 cm RV S prime:     11.50 cm/s TAPSE (M-mode): 1.5 cm LEFT ATRIUM         Index       RIGHT ATRIUM           Index LA diam:    3.00 cm 1.54 cm/m  RA Area:     11.90 cm                                 RA Volume:   23.60 ml  12.10 ml/m  AORTIC VALVE AV Area (Vmax):    2.15 cm AV Area (Vmean):   1.95 cm AV Area (VTI):     2.14 cm AV Vmax:           149.00 cm/s AV Vmean:          111.000 cm/s AV VTI:            0.254 m AV Peak Grad:      8.9 mmHg AV Mean Grad:      5.0 mmHg LVOT Vmax:         126.00 cm/s LVOT Vmean:        84.900 cm/s LVOT VTI:          0.214 m LVOT/AV VTI ratio: 0.84  AORTA Ao Root diam: 3.40 cm MITRAL VALVE               TRICUSPID VALVE MV Area (PHT): 3.34 cm    TR Peak grad:   46.5 mmHg MV Area VTI:   2.35 cm    TR Vmax:        341.00 cm/s MV Peak grad:  4.8 mmHg MV Mean  grad:  2.0 mmHg    SHUNTS MV Vmax:       1.09 m/s    Systemic VTI:  0.21 m MV Vmean:      56.7 cm/s   Systemic Diam: 1.80 cm MV Decel Time: 227 msec MV E velocity: 71.80 cm/s MV A velocity: 95.20 cm/s MV E/A ratio:  0.75 Lennie Odor MD Electronically signed by Lennie Odor MD Signature Date/Time: 05/15/2023/1:58:47 PM    Final    CT Angio Chest PE W and/or Wo Contrast  Result Date: 05/14/2023 CLINICAL DATA:  Pulmonary embolism (PE) suspected, high prob EXAM: CT ANGIOGRAPHY CHEST WITH CONTRAST TECHNIQUE: Multidetector CT imaging of the chest was performed using the standard protocol during bolus administration of intravenous contrast. Multiplanar CT image reconstructions and MIPs were obtained to evaluate the vascular anatomy. RADIATION DOSE REDUCTION: This exam was performed according to the departmental dose-optimization program which includes automated exposure control, adjustment of the mA and/or kV according to patient size and/or use of iterative reconstruction technique. CONTRAST:  75mL OMNIPAQUE IOHEXOL 350 MG/ML SOLN COMPARISON:  05/05/2023 FINDINGS: Cardiovascular: Satisfactory opacification of the pulmonary arteries. Extensive bilateral pulmonary emboli involving both main pulmonary arteries, right greater than left. Thrombus involves numerous lobar, segmental, and subsegmental branch pulmonary arteries bilaterally. No saddle embolism. Mildly elevated RV to LV ratio of 1.05. Heart size is normal. No pericardial effusion. Thoracic aorta is nonaneurysmal. Mediastinum/Nodes: No enlarged mediastinal, hilar, or axillary lymph nodes. Thyroid gland, trachea, and esophagus demonstrate no significant findings. Lungs/Pleura: Wedge-shaped airspace consolidation with air bronchograms within the right middle lobe. Additional smaller areas of airspace consolidation within the periphery of the right upper lobe and  superior aspect of the right lower lobe. Bibasilar atelectasis. No pleural effusion or pneumothorax.  Upper Abdomen: No acute abnormality. Musculoskeletal: No chest wall abnormality. No acute or significant osseous findings. Review of the MIP images confirms the above findings. IMPRESSION: 1. Positive for acute bilateral PE with CT evidence of right heart strain (RV/LV Ratio = 1.05) consistent with at least submassive (intermediate risk) PE. The presence of right heart strain has been associated with an increased risk of morbidity and mortality. Please refer to the "Code PE Focused" order set in EPIC. 2. Wedge-shaped airspace consolidation within the right middle lobe with additional smaller areas of airspace consolidation within the periphery of the right upper lobe and superior aspect of the right lower lobe. Findings are most concerning for pulmonary infarcts. Multifocal pneumonia could also result in this appearance in the appropriate clinical setting. Critical Value/emergent results were called by telephone at the time of interpretation on 05/14/2023 at 7:10 pm to provider Dr. Tanda Rockers , who verbally acknowledged these results. Electronically Signed   By: Duanne Guess D.O.   On: 05/14/2023 19:10        Scheduled Meds:  folic acid  1 mg Oral Daily   insulin aspart  0-15 Units Subcutaneous TID WC   insulin aspart  0-5 Units Subcutaneous QHS   insulin glargine-yfgn  10 Units Subcutaneous Daily   losartan  25 mg Oral Daily   [START ON 05/17/2023] methotrexate  10 mg Oral Weekly   predniSONE  20 mg Oral Q breakfast   sodium chloride flush  3 mL Intravenous Q12H   Continuous Infusions:  sodium chloride 75 mL/hr at 05/16/23 0921   azithromycin Stopped (05/15/23 2249)   heparin 1,300 Units/hr (05/16/23 1044)     LOS: 2 days    Time spent: 35 minutes.    Berton Mount, MD  [facility] Pager #: [phone] 7PM-7AM contact night coverage as above

## 2023-05-16 NOTE — Inpatient Diabetes Management (Signed)
Inpatient Diabetes Program Recommendations  AACE/ADA: New Consensus Statement on Inpatient Glycemic Control (2015)  Target Ranges:  Prepandial:   less than 140 mg/dL      Peak postprandial:   less than 180 mg/dL (1-2 hours)      Critically ill patients:  140 - 180 mg/dL   Lab Results  Component Value Date   GLUCAP 183 (H) 05/16/2023   HGBA1C 7.6 (H) 05/15/2023    Review of Glycemic Control  Latest Reference Range & Units 05/15/23 09:45 05/15/23 12:44 05/15/23 18:47 05/15/23 20:21 05/16/23 00:08 05/16/23 04:09 05/16/23 06:38 05/16/23 12:21  Glucose-Capillary 70 - 99 mg/dL 782 (H) 956 (H) 213 (H) 266 (H) 246 (H) 162 (H) 126 (H) 183 (H)   Diabetes history: None, steroid induced hyperglycemia per attending  Current orders for Inpatient glycemic control:  Semglee 10 units Daily Novolog 0-15 units tid + hs Novolog 3 units tid meal coverage  PO Prednisone 20 mg Daily  Note: Pt started on steroid taper on 7/18. While on steroids pt may require insulin and glucose control outpatient. Will follow glucose trends while inpatient and educate when able closer to d/c.  Note: Diabetes Coordinator works remotely over the weekends but is available by phone/pager if needed.   Thanks,  Christena Deem RN, MSN, BC-ADM Inpatient Diabetes Coordinator Team Pager [phone] (8a-5p)

## 2023-05-16 NOTE — Progress Notes (Signed)
ANTICOAGULATION CONSULT NOTE  Pharmacy Consult for Heparin Indication: pulmonary embolus   Allergies  Allergen Reactions   Aspirin Hives   Ibuprofen Hives   Other     Anesthesia (familial) SISTER HAD MALIGNANT HYPERTHERMIA   Propofol Other (See Comments)    Patient Measurements: Height: 5\' 8"  (172.7 cm) Weight: 81.2 kg (179 lb 0.2 oz) IBW/kg (Calculated) : 63.9 Heparin Dosing Weight: 80.3 kg  Vital Signs: Temp: 97.5 F (36.4 C) (08/10 0406) Temp Source: Oral (08/10 0406) BP: 142/85 (08/10 0406) Pulse Rate: 79 (08/10 0406)  Labs: Recent Labs    05/14/23 1749 05/14/23 1930 05/15/23 0146 05/15/23 1101 05/16/23 0413  HGB 11.7*  --  11.7*  --  9.2*  HCT 33.0*  --  34.1*  --  27.7*  PLT 188  --  162  --  180  APTT  --  32 65*  --   --   LABPROT  --  14.2 14.8  --   --   INR  --  1.1 1.1  --   --   HEPARINUNFRC  --   --  0.41 0.31 0.40  CREATININE 0.69  --  0.68  --   --   TROPONINIHS 15 13  --   --   --     Estimated Creatinine Clearance: 78.4 mL/min (by C-G formula based on SCr of 0.68 mg/dL).   Assessment: 67 y.o. female presented 8/8 with bilateral PE with right heart strain and extensive bilateral lower extremity DVTs.  Patient reports family history of clotting disorder (father).  PCCM consulted for possible lytics in the event of hemodynamic instability but stable thus far - PCCM signed off.  Pharmacy consulted for heparin dosing.  Heparin level is therapeutic at 0.40. No issues with infusion or s/sx of bleeding per RN.  Goal of Therapy:  Heparin level 0.3-0.7 units/ml Monitor platelets by anticoagulation protocol: Yes   Plan:  Continue heparin 1300 units/hr Monitor daily anti-Xa levels, CBC, s/sx bleeding F/u transition to DOAC  Nicole Kindred, PharmD PGY1 Pharmacy Resident 05/16/2023 7:59 AM

## 2023-05-17 ENCOUNTER — Inpatient Hospital Stay (HOSPITAL_COMMUNITY): Payer: PPO

## 2023-05-17 DIAGNOSIS — M7989 Other specified soft tissue disorders: Secondary | ICD-10-CM

## 2023-05-17 DIAGNOSIS — I2602 Saddle embolus of pulmonary artery with acute cor pulmonale: Secondary | ICD-10-CM | POA: Diagnosis not present

## 2023-05-17 LAB — GLUCOSE, CAPILLARY
Glucose-Capillary: 123 mg/dL — ABNORMAL HIGH (ref 70–99)
Glucose-Capillary: 192 mg/dL — ABNORMAL HIGH (ref 70–99)
Glucose-Capillary: 198 mg/dL — ABNORMAL HIGH (ref 70–99)
Glucose-Capillary: 175 mg/dL — ABNORMAL HIGH (ref 70–99)

## 2023-05-17 LAB — CBC
HCT: 26.6 % — ABNORMAL LOW (ref 36.0–46.0)
Hemoglobin: 8.7 g/dL — ABNORMAL LOW (ref 12.0–15.0)
MCH: 32.1 pg (ref 26.0–34.0)
MCHC: 32.7 g/dL (ref 30.0–36.0)
MCV: 98.2 fL (ref 80.0–100.0)
Platelets: 223 10*3/uL (ref 150–400)
RBC: 2.71 MIL/uL — ABNORMAL LOW (ref 3.87–5.11)
RDW: 15.4 % (ref 11.5–15.5)
WBC: 6.7 10*3/uL (ref 4.0–10.5)
nRBC: 1 % — ABNORMAL HIGH (ref 0.0–0.2)

## 2023-05-17 LAB — HEPARIN LEVEL (UNFRACTIONATED): Heparin Unfractionated: 0.33 [IU]/mL (ref 0.30–0.70)

## 2023-05-17 NOTE — Progress Notes (Signed)
VASCULAR LAB    Left lower extremity venous duplex has been performed.  See CV proc for preliminary results.   , , RVT 05/17/2023, 12:38 PM

## 2023-05-17 NOTE — Progress Notes (Signed)
Change in status:  Increased swelling LLE & c/o new onset nausea.   MD notified & new orders received.  Lower extremity doppler pending.

## 2023-05-17 NOTE — Progress Notes (Signed)
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  Patient has been on prednisone on methotrexate.  Patient was admitted with acute pulmonary embolism and bilateral lower extremity DVT.  History of travel to [place] reported.  Patient is currently on heparin.  05/16/2023: Patient seen.  Procalcitonin is less than 0.1.  Will discontinue antibiotics.  Patient was seen alongside patient's husband.  All were updated.  Continue heparin for now.  Likely will continue heparin for the next 24 to 48 hours.  05/17/2023: Patient seen.  Swelling of left lower extremity noted.  Repeat Doppler ultrasound of lower extremity has not shown any changes in clot burden.  Will consult interventional radiology team for possible mechanical thrombectomy and thrombolysis of left lower extremity DVT.  No shortness of breath or chest pain reported.  Assessment & Plan:   Principal Problem:   Acute pulmonary embolism (HCC) Active Problems:   Primary hypertension   Mucous membrane pemphigoid   Pulmonary embolism (HCC)   Hyperglycemia   Hypercalcemia   Acute pulmonary embolism (HCC) -Without any hemodynamic instability.  However associated pulmonary infarcts as well as associated borderline questionable hypoxemia.  Patient being treated with supplementary oxygen with plan to check orthostatic in the morning as well as ambulatory pulse oximetry.  Patient has been started on heparin infusion, we will monitor patient's clinical evolution on this.  I advised patient to report any new symptoms including but not limited to headache, new back pain any neurologic symptoms on the lower extremity such as tingling or weakness or bladder or bowel changes or any black  liquid stool to the staff right away. Check lower extremity dopplers.   Patient is noted to have leukocytosis, sinus tachycardia, and consolidation on CAT scan.  This is likely due to concern infarction of the lung.  However patient is immunocompromise getting steroid therapy as well as methotrexate.  At this time therefore I will check blood cultures and start empiric treatment for pneumonia 05/15/2023: Continue heparin.  Likely follow-up with hematology team on discharge.  Incentive spirometry. 05/17/2023: Continue heparin.   Hypercalcemia Very mild, incidental.  I will check PTH, vitamin D Phos morning. Noted on April 16th as well. 05/15/2023: Resolved.   Hyperglycemia This is likely been precipitated by patient being started on steroid therapy recently.  See below.  I will maintain the patient on insulin sliding scale at this time. 05/15/2023: Add Semglee.  A1c 7.6%.  Consult diabetic educator.  Patient has been on steroids.   Mucous membrane pemphigoid [name], MD - 04/23/2023 8:45 AM EDT Formatting of this note is different from the original. Images from the original note were not included. Dermatology Note  05/15/2023: Hematology team is managing.  Extensive DVT left lower extremity: -Consult IR for possible mechanical thrombectomy and thrombolysis. -Will keep patient n.p.o. after midnight.   DVT prophylaxis: Heparin. Code Status: Full code. Family Communication: Sister. Disposition Plan: Home eventually.  Remains inpatient.   Consultants:  None for now. Follow-up with hematology on discharge. Interventional radiology team for possible mechanical thrombectomy and thrombolysis of left lower extremity DVT.  Procedures:  None.  Antimicrobials:  IV Unasyn discontinued.    Subjective: -No fever or chills. -No shortness of breath.  Objective: Vitals:   05/16/23 2329 05/17/23 0334 05/17/23 0807 05/17/23 1126  BP: 133/74 (!) 145/82 (!) 145/75 134/73  Pulse: 99 91 74 78   Resp: 20 20 (!) 21 20  Temp: 98.4 F (36.9 C) 98.8 F (37.1 C) 98.7 F (37.1 C) 97.8 F (36.6 C)  TempSrc: Oral Oral Oral Oral  SpO2: 92% 96% 96% 94%  Weight:      Height:        Intake/Output Summary (Last 24 hours) at 05/17/2023 1339 Last data filed at 05/17/2023 0917 Gross per 24 hour  Intake 2735.11 ml  Output 800 ml  Net 1935.11 ml   Filed Weights   05/14/23 1626  Weight: 81.2 kg    Examination: General exam: Appears calm and comfortable.  Patient is obese.  Cushingoid facies. Respiratory system: Clear to auscultation.  Cardiovascular system: S1 & S2 heard. Gastrointestinal system: Abdomen is obese, soft and nontender.   Central nervous system: Awake and alert.  Patient was all extremities.   Extremities: Swelling of left lower extremity.   Data Reviewed: I have personally reviewed following labs and imaging studies  CBC: Recent Labs  Lab 05/14/23 1749 05/15/23 0146 05/16/23 0413 05/17/23 0348  WBC 13.1* 9.3 6.4 6.7  NEUTROABS 11.7*  --   --   --   HGB 11.7* 11.7* 9.2* 8.7*  HCT 33.0* 34.1* 27.7* 26.6*  MCV 94.3 93.9 100.4* 98.2  PLT 188 162 180 223   Basic Metabolic Panel: Recent Labs  Lab 05/14/23 1749 05/15/23 0146  NA 130* 133*  K 4.5 3.3*  CL 100 103  CO2 21* 18*  GLUCOSE 407* 285*  BUN 33* 25*  CREATININE 0.69 0.68  CALCIUM 10.4* 9.6  PHOS  --  2.9   GFR: Estimated Creatinine Clearance: 78.4 mL/min (by C-G formula based on SCr of 0.68 mg/dL). Liver Function Tests: Recent Labs  Lab 05/14/23 1749 05/15/23 0146  AST 26 21  ALT 42 39  ALKPHOS 74 74  BILITOT 1.5* 1.3*  PROT 6.5 5.9*  ALBUMIN 3.7 3.0*   No results for input(s): "LIPASE", "AMYLASE" in the last 168 hours. No results for input(s): "AMMONIA" in the last 168 hours. Coagulation Profile: Recent Labs  Lab 05/14/23 1930 05/15/23 0146  INR 1.1 1.1   Cardiac Enzymes: No results for input(s): "CKTOTAL", "CKMB", "CKMBINDEX", "TROPONINI" in the last 168 hours. BNP (last  3 results) No results for input(s): "PROBNP" in the last 8760 hours. HbA1C: Recent Labs    05/15/23 0146  HGBA1C 7.6*   CBG: Recent Labs  Lab 05/16/23 1221 05/16/23 1654 05/16/23 2133 05/17/23 0621 05/17/23 1125  GLUCAP 183* 270* 209* 123* 192*   Lipid Profile: No results for input(s): "CHOL", "HDL", "LDLCALC", "TRIG", "CHOLHDL", "LDLDIRECT" in the last 72 hours. Thyroid Function Tests: No results for input(s): "TSH", "T4TOTAL", "FREET4", "T3FREE", "THYROIDAB" in the last 72 hours. Anemia Panel: No results for input(s): "VITAMINB12", "FOLATE", "FERRITIN", "TIBC", "IRON", "RETICCTPCT" in the last 72 hours. Urine analysis: No results found for: "COLORURINE", "APPEARANCEUR", "LABSPEC", "PHURINE", "GLUCOSEU", "HGBUR", "BILIRUBINUR", "KETONESUR", "PROTEINUR", "UROBILINOGEN", "NITRITE", "LEUKOCYTESUR" Sepsis Labs: @LABRCNTIP (procalcitonin:4,lacticidven:4)  ) Recent Results (from the past 240 hour(s))  Resp panel by RT-PCR (RSV, Flu A&B, Covid) Anterior Nasal Swab     Status: None   Collection Time: 05/14/23  5:50 PM   Specimen: Anterior Nasal Swab  Result Value Ref Range Status   SARS Coronavirus 2 by RT PCR NEGATIVE NEGATIVE Final    Comment: (NOTE) SARS-CoV-2 target nucleic acids are NOT DETECTED.  The SARS-CoV-2 RNA is generally detectable in upper respiratory specimens during the acute  phase of infection. The lowest concentration of SARS-CoV-2 viral copies this assay can detect is 138 copies/mL. A negative result does not preclude SARS-Cov-2 infection and should not be used as the sole basis for treatment or other patient management decisions. A negative result may occur with  improper specimen collection/handling, submission of specimen other than nasopharyngeal swab, presence of viral mutation(s) within the areas targeted by this assay, and inadequate number of viral copies(<138 copies/mL). A negative result must be combined with clinical observations, patient  history, and epidemiological information. The expected result is Negative.  Fact Sheet for Patients:  BloggerCourse.com  Fact Sheet for Healthcare Providers:  SeriousBroker.it  This test is no t yet approved or cleared by the [place] FDA and  has been authorized for detection and/or diagnosis of SARS-CoV-2 by FDA under an Emergency Use Authorization (EUA). This EUA will remain  in effect (meaning this test can be used) for the duration of the COVID-19 declaration under Section 564(b)(1) of the Act, 21 U.S.C.section 360bbb-3(b)(1), unless the authorization is terminated  or revoked sooner.       Influenza A by PCR NEGATIVE NEGATIVE Final   Influenza B by PCR NEGATIVE NEGATIVE Final    Comment: (NOTE) The Xpert Xpress SARS-CoV-2/FLU/RSV plus assay is intended as an aid in the diagnosis of influenza from Nasopharyngeal swab specimens and should not be used as a sole basis for treatment. Nasal washings and aspirates are unacceptable for Xpert Xpress SARS-CoV-2/FLU/RSV testing.  Fact Sheet for Patients: BloggerCourse.com  Fact Sheet for Healthcare Providers: SeriousBroker.it  This test is not yet approved or cleared by the [place] FDA and has been authorized for detection and/or diagnosis of SARS-CoV-2 by FDA under an Emergency Use Authorization (EUA). This EUA will remain in effect (meaning this test can be used) for the duration of the COVID-19 declaration under Section 564(b)(1) of the Act, 21 U.S.C. section 360bbb-3(b)(1), unless the authorization is terminated or revoked.     Resp Syncytial Virus by PCR NEGATIVE NEGATIVE Final    Comment: (NOTE) Fact Sheet for Patients: BloggerCourse.com  Fact Sheet for Healthcare Providers: SeriousBroker.it  This test is not yet approved or cleared by the [place] FDA  and has been authorized for detection and/or diagnosis of SARS-CoV-2 by FDA under an Emergency Use Authorization (EUA). This EUA will remain in effect (meaning this test can be used) for the duration of the COVID-19 declaration under Section 564(b)(1) of the Act, 21 U.S.C. section 360bbb-3(b)(1), unless the authorization is terminated or revoked.  Performed at [facility], [address] [place], [place] [address]   Culture, blood (Routine X 2) w Reflex to ID Panel     Status: None (Preliminary result)   Collection Time: 05/15/23  1:42 AM   Specimen: BLOOD LEFT ARM  Result Value Ref Range Status   Specimen Description BLOOD LEFT ARM  Final   Special Requests   Final    BOTTLES DRAWN AEROBIC AND ANAEROBIC Blood Culture adequate volume   Culture   Final    NO GROWTH 1 DAY Performed at [facility], [address] [place], [place] [address]    Report Status PENDING  Incomplete  Culture, blood (Routine X 2) w Reflex to ID Panel     Status: None (Preliminary result)   Collection Time: 05/15/23  1:51 AM   Specimen: BLOOD LEFT ARM  Result Value Ref Range Status   Specimen Description BLOOD LEFT ARM  Final   Special Requests  Final    BOTTLES DRAWN AEROBIC AND ANAEROBIC Blood Culture adequate volume   Culture   Final    NO GROWTH 1 DAY Performed at [facility], [address] [place], [place] [address]    Report Status PENDING  Incomplete         Radiology Studies: VAS [place] LOWER EXTREMITY VENOUS (DVT)  Result Date: 05/17/2023  Lower Venous DVT Study Patient Name:  Heather Hudson  Date of Exam:   05/17/2023 Medical Rec #: [id number]      Accession #:    [id number] Date of Birth: 08-30-1956       Patient Gender: F Patient Age:   52 years Exam Location:  [facility] Procedure:      VAS [place] LOWER EXTREMITY VENOUS (DVT) Referring Phys: Jacqlyn Krauss  --------------------------------------------------------------------------------  Indications:  Progressive swelling.  Risk Factors: DVT 05/15/2023 Acute Right popliteal, PT, Peroneal, and soleal veins and left external iliac, common femoral, SF junction, femoral, profunda, popliteal, PT, peroneal, and gastroc veins. Limitations: Bowel gas and body habitus. Comparison Study: Prior study done 05/15/2023 Performing Technologist: Sherren Kerns RVS  Examination Guidelines: A complete evaluation includes B-mode imaging, spectral Doppler, color Doppler, and power Doppler as needed of all accessible portions of each vessel. Bilateral testing is considered an integral part of a complete examination. Limited examinations for reoccurring indications may be performed as noted. The reflux portion of the exam is performed with the patient in reverse Trendelenburg.  +-----+---------------+---------+-----------+----------+--------------+ RIGHTCompressibilityPhasicitySpontaneityPropertiesThrombus Aging +-----+---------------+---------+-----------+----------+--------------+ CFV  Full           Yes      Yes                                 +-----+---------------+---------+-----------+----------+--------------+   +---------+---------------+---------+-----------+----------+--------------+ LEFT     CompressibilityPhasicitySpontaneityPropertiesThrombus Aging +---------+---------------+---------+-----------+----------+--------------+ CFV      None           No       No                   Acute          +---------+---------------+---------+-----------+----------+--------------+ SFJ      None           No       No                   Acute          +---------+---------------+---------+-----------+----------+--------------+ FV Prox  None           No       No                   Acute          +---------+---------------+---------+-----------+----------+--------------+ FV Mid   None           No       No                   Acute           +---------+---------------+---------+-----------+----------+--------------+ FV DistalNone           No       No                   Acute          +---------+---------------+---------+-----------+----------+--------------+ PFV      None           No  No                   Acute          +---------+---------------+---------+-----------+----------+--------------+ POP      None           No       No                   Acute          +---------+---------------+---------+-----------+----------+--------------+ PTV      None                                         Acute          +---------+---------------+---------+-----------+----------+--------------+ PERO     None                                         Acute          +---------+---------------+---------+-----------+----------+--------------+ Gastroc  None           No       No                   Acute          +---------+---------------+---------+-----------+----------+--------------+ EIV      None                                         Acute          +---------+---------------+---------+-----------+----------+--------------+ CIV                                                   not visualized +---------+---------------+---------+-----------+----------+--------------+    Summary: RIGHT: - Findings appear essentially unchanged compared to previous examination. - There is no evidence of deep vein thrombosis in the lower extremity.  LEFT: - Findings consistent with acute deep vein thrombosis involving the left common femoral vein, SF junction, left femoral vein, left proximal profunda vein, left popliteal vein, left posterior tibial veins, left peroneal veins, left gastrocnemius veins, and EIV. - Findings appear essentially unchanged compared to previous examination.  *See table(s) above for measurements and observations.    Preliminary         Scheduled Meds:  folic acid  1 mg Oral Daily   insulin aspart   0-15 Units Subcutaneous TID WC   insulin aspart  0-5 Units Subcutaneous QHS   insulin glargine-yfgn  10 Units Subcutaneous Daily   losartan  25 mg Oral Daily   methotrexate  10 mg Oral Weekly   predniSONE  20 mg Oral Q breakfast   sodium chloride flush  3 mL Intravenous Q12H   Continuous Infusions:  sodium chloride 75 mL/hr at 05/17/23 0917   heparin 1,400 Units/hr (05/17/23 1134)     LOS: 3 days    Time spent: 35 minutes.    Berton Mount, MD  [facility] Pager #: [phone] 7PM-7AM contact night coverage as above

## 2023-05-17 NOTE — Progress Notes (Cosign Needed Addendum)
ANTICOAGULATION CONSULT NOTE  Pharmacy Consult for Heparin Indication: pulmonary embolus   Allergies  Allergen Reactions   Aspirin Hives   Ibuprofen Hives   Other     Anesthesia (familial) SISTER HAD MALIGNANT HYPERTHERMIA   Propofol Other (See Comments)    Patient Measurements: Height: 5\' 8"  (172.7 cm) Weight: 81.2 kg (179 lb 0.2 oz) IBW/kg (Calculated) : 63.9 Heparin Dosing Weight: 80.3 kg  Vital Signs: Temp: 98.7 F (37.1 C) (08/11 0807) Temp Source: Oral (08/11 0807) BP: 145/75 (08/11 0807) Pulse Rate: 74 (08/11 0807)  Labs: Recent Labs    05/14/23 1749 05/14/23 1749 05/14/23 1930 05/15/23 0146 05/15/23 1101 05/16/23 0413 05/17/23 0348  HGB 11.7*  --   --  11.7*  --  9.2* 8.7*  HCT 33.0*  --   --  34.1*  --  27.7* 26.6*  PLT 188  --   --  162  --  180 223  APTT  --   --  32 65*  --   --   --   LABPROT  --   --  14.2 14.8  --   --   --   INR  --   --  1.1 1.1  --   --   --   HEPARINUNFRC  --    < >  --  0.41 0.31 0.40 0.33  CREATININE 0.69  --   --  0.68  --   --   --   TROPONINIHS 15  --  13  --   --   --   --    < > = values in this interval not displayed.    Estimated Creatinine Clearance: 78.4 mL/min (by C-G formula based on SCr of 0.68 mg/dL).   Assessment: 67 y.o. female presented 8/8 with bilateral PE with right heart strain and extensive bilateral lower extremity DVTs.  Patient reports family history of clotting disorder (father).  PCCM consulted for possible lytics in the event of hemodynamic instability but stable thus far - PCCM signed off.  Pharmacy consulted for heparin dosing.  HL 0.33 this AM. Heparin level has consistently been at lower end of therapeutic range, therefore will increase dose. Hgb slowly trending down, plts stable.  No issues with infusion or s/sx of bleeding per RN. RN does note pt is worsening and is notifying the MD now.  Goal of Therapy:  Heparin level 0.3-0.7 units/ml Monitor platelets by anticoagulation protocol:  Yes   Plan:  Increase heparin to 1400 units/hr Monitor daily anti-Xa levels, CBC, s/sx bleeding F/u transition to DOAC  Nicole Kindred, PharmD PGY1 Pharmacy Resident 05/17/2023 11:01 AM

## 2023-05-18 ENCOUNTER — Inpatient Hospital Stay (HOSPITAL_COMMUNITY): Payer: PPO

## 2023-05-18 ENCOUNTER — Encounter (HOSPITAL_COMMUNITY): Payer: Self-pay | Admitting: Internal Medicine

## 2023-05-18 DIAGNOSIS — I2601 Septic pulmonary embolism with acute cor pulmonale: Secondary | ICD-10-CM | POA: Diagnosis not present

## 2023-05-18 LAB — TROPONIN I (HIGH SENSITIVITY)
Troponin I (High Sensitivity): 11 ng/L (ref <18)
Troponin I (High Sensitivity): 10 ng/L (ref <18)

## 2023-05-18 LAB — RENAL FUNCTION PANEL
Albumin: 2.6 g/dL — ABNORMAL LOW (ref 3.5–5.0)
Anion gap: 11 (ref 5–15)
BUN: 9 mg/dL (ref 8–23)
CO2: 18 mmol/L — ABNORMAL LOW (ref 22–32)
Calcium: 9.4 mg/dL (ref 8.9–10.3)
Chloride: 112 mmol/L — ABNORMAL HIGH (ref 98–111)
Creatinine, Ser: 0.59 mg/dL (ref 0.44–1.00)
GFR, Estimated: >60 mL/min (ref >60)
Glucose, Bld: 124 mg/dL — ABNORMAL HIGH (ref 70–99)
Phosphorus: 3.4 mg/dL (ref 2.5–4.6)
Potassium: 3.6 mmol/L (ref 3.5–5.1)
Sodium: 141 mmol/L (ref 135–145)

## 2023-05-18 LAB — GLUCOSE, CAPILLARY
Glucose-Capillary: 124 mg/dL — ABNORMAL HIGH (ref 70–99)
Glucose-Capillary: 129 mg/dL — ABNORMAL HIGH (ref 70–99)
Glucose-Capillary: 141 mg/dL — ABNORMAL HIGH (ref 70–99)
Glucose-Capillary: 151 mg/dL — ABNORMAL HIGH (ref 70–99)
Glucose-Capillary: 173 mg/dL — ABNORMAL HIGH (ref 70–99)

## 2023-05-18 LAB — MAGNESIUM: Magnesium: 1.9 mg/dL (ref 1.7–2.4)

## 2023-05-18 MED ORDER — IOHEXOL 350 MG/ML SOLN
75.0000 mL | Freq: Once | INTRAVENOUS | Status: AC | PRN
  Administered 2023-05-18: 75 mL via INTRAVENOUS

## 2023-05-18 MED ORDER — LIVING WELL WITH DIABETES BOOK
Freq: Once | Status: AC
  Filled 2023-05-18: qty 1

## 2023-05-18 NOTE — Progress Notes (Signed)
CTA results resulted.  MD notified via telephone

## 2023-05-18 NOTE — Inpatient Diabetes Management (Addendum)
Inpatient Diabetes Program Recommendations  AACE/ADA: New Consensus Statement on Inpatient Glycemic Control  Target Ranges:  Prepandial:   less than 140 mg/dL      Peak postprandial:   less than 180 mg/dL (1-2 hours)      Critically ill patients:  140 - 180 mg/dL    Latest Reference Range & Units 05/17/23 06:21 05/17/23 11:25 05/17/23 16:27 05/17/23 21:16 05/18/23 00:00 05/18/23 05:33  Glucose-Capillary 70 - 99 mg/dL 213 (H) 086 (H) 578 (H) 175 (H) 141 (H) 129 (H)    Latest Reference Range & Units 01/16/22 09:06 05/15/23 01:46  Hemoglobin A1C 4.8 - 5.6 % 5.1 7.6 (H)    Review of Glycemic Control  Diabetes history: No; new DM dx Outpatient Diabetes medications: NA Current orders for Inpatient glycemic control: Semglee 10 units daily, Novolog 0-15 units TID with meals, Novolog 0-5 units at bedtime; Prednisone 20 mg QAM  Inpatient Diabetes Program Recommendations:    HbgA1C:  A1C 7.6% on 05/15/23 indicating an average glucose of 171 mg/dl over the past 2-3 months. Note patient has been on steroids since 01/15/23 (initially on Prednisone 100 mg daily) which is contributing to elevated A1C. Would recommend patient has A1C repeated in 3-6 months.   DM medication: If patient is discharged on DM medication, please consider ordering Metformin 500 mg BID.  Outpatient DM: At discharge, please provide Rx for glucose monitoring kit (#[id number]).  NOTE: Noted consult for diabetes coordinator for new onset DM. In reviewing chart, noted patient has been on steroids since 04/23/23 which is contributing to elevated A1C. Ordered Living Well with DM book. Will plan to talk with patient today about new DM dx.   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  Patient states she does not have any prior hx of preDM or DM. Patient states her brother and mother had DM.  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 Patient states that she initially started on Prednisone 100 mg daily and when she seen a specialist on 04/23/23 the dose was decreased to 30 mg daily which has continued to be tapered. Patient is not sure if she will need to stay on long term steroids or not.  Discussed impact of steroids on glucose.  Discussed A1C results (7.6% on 05/15/23) and explained what an A1C is and informed patient that current A1C indicates an average glucose of 171 mg/dl over the past 2-3 months. Discussed basic pathophysiology of DM, basic home care, importance of checking CBGs and maintaining good CBG control to prevent long-term and short-term complications. Reviewed glucose and A1C goals.  Reviewed signs and symptoms of hyperglycemia and hypoglycemia along with treatment for both. Discussed impact of nutrition, exercise, stress, sickness, and medications on diabetes control. Discussed carb modified diet.  Informed patient a Living Well with diabetes booklet was ordered and once she receives it,  encouraged patient to read through entire book. Explained that she may be prescribed DM medication at discharge; discussed Metformin in case it was prescribed as outpatient. Explained that she will need to start monitoring glucose and if her glucose was consistently over 150 mg/dl, her PCP may need to adjust DM medications especially if steroids were continued.  Patient verbalized understanding of information discussed and she states that she has no further questions at this time related to diabetes.   RNs to provide ongoing basic DM education at bedside with this patient and engage patient  to actively check blood glucose.  Thanks, Orlando Penner, RN, MSN, CDCES Diabetes Coordinator Inpatient Diabetes Program [phone] (Team Pager from 8am to 5pm)

## 2023-05-18 NOTE — Progress Notes (Signed)
Bursts of rapid HR noted beginning at 1321.  MD notified.  EKG x 2 & placed in chart.   New orders received.

## 2023-05-18 NOTE — Progress Notes (Signed)
ANTICOAGULATION CONSULT NOTE  Pharmacy Consult for Heparin Indication: pulmonary embolus   Allergies  Allergen Reactions   Aspirin Hives   Ibuprofen Hives   Other     Anesthesia (familial) SISTER HAD MALIGNANT HYPERTHERMIA   Propofol Other (See Comments)    Patient Measurements: Height: 5\' 8"  (172.7 cm) Weight: 81.2 kg (179 lb 0.2 oz) IBW/kg (Calculated) : 63.9 Heparin Dosing Weight: 80.3 kg  Vital Signs: Temp: 98.2 F (36.8 C) (08/12 0711) Temp Source: Oral (08/12 0711) BP: 153/87 (08/12 0711) Pulse Rate: 70 (08/12 0711)  Labs: Recent Labs    05/16/23 0413 05/17/23 0348 05/18/23 0202  HGB 9.2* 8.7* 9.4*  HCT 27.7* 26.6* 28.0*  PLT 180 223 273  HEPARINUNFRC 0.40 0.33 0.32    Estimated Creatinine Clearance: 78.4 mL/min (by C-G formula based on SCr of 0.68 mg/dL).   Assessment: 67 y.o. female presented 8/8 with bilateral PE with right heart strain and extensive bilateral lower extremity DVTs.  Patient reports family history of clotting disorder (father).  Pharmacy consulted for heparin dosing.  HL 0.32 and at the low end of goal , CBC stable  Goal of Therapy:  Heparin level 0.3-0.7 units/ml Monitor platelets by anticoagulation protocol: Yes   Plan:  Increase heparin to 1500 units/hr Monitor daily anti-Xa levels, CBC, s/sx bleeding F/u transition to DOAC  Harland German, PharmD Clinical Pharmacist **Pharmacist phone directory can now be found on [email or web address] ([email or web address]).  Listed under [facility].

## 2023-05-18 NOTE — Consult Note (Signed)
Chief Complaint: Patient was seen in consultation today for DVT, pulmonary embolus  Referring Physician(s): Dr. Berton Mount  Supervising Physician: Gilmer Mor  Patient Status: [facility] - In-pt  History of Present Illness: Heather Hudson is a 67 y.o. 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  She was found to have LLE DVT with submassive bilateral PE.  She was started on heparin and has been doing well from respiratory standpoint, however has noticed progressively worsening swelling of the left lower extremity from the toes to thigh.  IR consulted for evaluation of lytic vs. Thrombectomy for DVT   Past Medical History:  Diagnosis Date   Adenomatous colon polyp 2015   Allergy 1974   teenager, and 2008   Colovesical fistula 2015   COVID-19 06/2020   Environmental and seasonal allergies    Hx of adenomatous colonic polyps 01/19/2021   Hypertension 11/23   Low dose prescription   Lower abdominal pain 12/23/2019   Migraines    Motion sickness    Persistent cough for 3 weeks or longer 10/10/2021    Past Surgical History:  Procedure Laterality Date   COLON SURGERY  2016   fistula, small section removed   COLONOSCOPY  2015   Plus others   LAPAROSCOPIC LEFT COLON RESECTION  2015   Colovesical fistula, [place]   Left peroneal tendon repair Left 2023   WISDOM TOOTH EXTRACTION      Allergies: Aspirin, Ibuprofen, Other, and Propofol  Medications: Prior to Admission medications   Medication Sig Start Date End Date Taking? Authorizing Provider  calcium-vitamin D (OSCAL WITH D) 500-5 MG-MCG tablet Take 1 tablet by mouth daily with breakfast.   Yes [provider]  Cholecalciferol (VITAMIN D3) 50 MCG (2000 UT) capsule Take 2,000 Units by mouth daily.   Yes [provider]  losartan (COZAAR) 25 MG tablet TAKE 1 TABLET BY MOUTH EVERY DAY FOR BLOOD PRESSURE  01/21/23  Yes [name], NP  methotrexate (RHEUMATREX) 2.5 MG tablet Take 10 mg by mouth once a week. 04/28/23  Yes [provider]  omeprazole (PRILOSEC) 20 MG capsule Take 20 mg by mouth daily.   Yes [provider]  predniSONE (DELTASONE) 10 MG tablet Take 25 mg by mouth daily in the afternoon. 01/14/23  Yes [provider]  scopolamine (TRANSDERM-SCOP) 1 MG/3DAYS Place 1 patch (1.5 mg total) onto the skin every 3 (three) days. Patient not taking: Reported on 05/05/2023 03/04/23   [name], NP     Family History  Problem Relation Age of Onset   Heart disease Mother    Diabetes Mother    Hearing loss Mother    Heart disease Father    Prostate cancer Father    Alcohol abuse Father    Cancer Father    COPD Father    Malignant hyperthermia Sister    Pancreatic cancer Maternal Aunt    Diabetes Brother    Cancer Maternal Grandmother    Miscarriages / Stillbirths Maternal Grandmother    Diabetes Brother    Intellectual disability Brother    Learning disabilities Brother    Vision loss Brother    Miscarriages / Stillbirths Sister    Miscarriages / Stillbirths Sister    Colon cancer Neg Hx    Esophageal cancer Neg Hx    Stomach cancer Neg Hx    Liver disease Neg Hx    Breast cancer Neg  Hx     Social History   Socioeconomic History   Marital status: Married    Spouse name: Not on file   Number of children: Not on file   Years of education: Not on file   Highest education level: Associate degree: occupational, Scientist, product/process development, or vocational program  Occupational History   Not on file  Tobacco Use   Smoking status: Never   Smokeless tobacco: Never   Tobacco comments:    Never smoked  Vaping Use   Vaping status: Never Used  Substance and Sexual Activity   Alcohol use: Yes    Alcohol/week: 5.0 standard drinks of alcohol    Types: 5 Glasses of wine per week   Drug use: Never   Sexual activity: Yes    Birth control/protection:  Post-menopausal  Other Topics Concern   Not on file  Social History Narrative   Married.  No children.   [place]    Worked in [employer], Insurance account manager.  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   Enjoys wine tasting.    1-2 caffeinated drinks a day 0-1 alcoholic never smoker no drug use   Sister Mardene Celeste works Adult nurse endoscopy as Charity fundraiser   Social Determinants of Corporate investment banker Strain: Low Risk  (05/04/2023)   Overall Financial Resource Strain (CARDIA)    Difficulty of Paying Living Expenses: Not hard at all  Food Insecurity: No Food Insecurity (05/16/2023)   Hunger Vital Sign    Worried About Running Out of Food in the Last Year: Never true    Ran Out of Food in the Last Year: Never true  Transportation Needs: No Transportation Needs (05/14/2023)   PRAPARE - Administrator, Civil Service (Medical): No    Lack of Transportation (Non-Medical): No  Physical Activity: Unknown (05/04/2023)   Exercise Vital Sign    Days of Exercise per Week: 0 days    Minutes of Exercise per Session: Not on file  Stress: No Stress Concern Present (05/04/2023)   [employer] of Occupational Health - Occupational Stress Questionnaire    Feeling of Stress : Not at all  Social Connections: Socially Integrated (05/04/2023)   Social Connection and Isolation Panel [NHANES]    Frequency of Communication with Friends and Family: More than three times a week    Frequency of Social Gatherings with Friends and Family: More than three times a week    Attends Religious Services: More than 4 times per year    Active Member of [employer] or Organizations: Yes    Attends Engineer, structural: More than 4 times per year    Marital Status: Married     Review of Systems: A 12 point ROS discussed and pertinent positives are indicated in the HPI above.  All other systems are  negative.  Review of Systems  Constitutional:  Negative for fatigue and fever.  HENT:  Negative for nosebleeds.   Respiratory:  Positive for shortness of breath.   Cardiovascular:  Negative for chest pain.  Gastrointestinal:  Negative for abdominal pain, blood in stool, nausea and vomiting.  Genitourinary:  Negative for hematuria and vaginal bleeding.  Skin:  Negative for wound.  Psychiatric/Behavioral:  Negative for behavioral problems and confusion.     Vital Signs: BP (!) 142/78 (BP Location: Left Arm)   Pulse 62   Temp 98.3 F (36.8 C) (Oral)  Resp 19   Ht 5\' 8"  (1.727 m)   Wt 179 lb 0.2 oz (81.2 kg)   SpO2 98%   BMI 27.22 kg/m   Physical Exam Vitals and nursing note reviewed.  Constitutional:      General: She is not in acute distress.    Appearance: She is normal weight. She is not ill-appearing.  Cardiovascular:     Rate and Rhythm: Normal rate and regular rhythm.     Pulses: Normal pulses.     Comments: DP palpable bilaterally. Pulmonary:     Effort: Pulmonary effort is normal.     Breath sounds: Normal breath sounds.  Musculoskeletal:        General: Swelling (Unilateral LLE extremity swelling noted from toes to mid thigh.   No warmth or erythema.  No cramping or pain with palpation. DP palpable) present. No tenderness. Normal range of motion.  Skin:    General: Skin is warm and dry.  Neurological:     General: No focal deficit present.     Mental Status: She is alert and oriented to person, place, and time.  Psychiatric:        Mood and Affect: Mood normal.        Behavior: Behavior normal.      MD Evaluation Airway: WNL Heart: WNL Abdomen: WNL Chest/ Lungs: WNL ASA  Classification: 3 Mallampati/Airway Score: Two   Imaging: VAS [place] LOWER EXTREMITY VENOUS (DVT)  Result Date: 05/17/2023  Lower Venous DVT Study Patient Name:  Heather Hudson  Date of Exam:   05/17/2023 Medical Rec #: [id number]      Accession #:    [id number] Date of Birth: [birth year]        Patient Gender: F Patient Age:   35 years Exam Location:  [facility] Procedure:      VAS [place] LOWER EXTREMITY VENOUS (DVT) Referring Phys: Jacqlyn Krauss OGBATA --------------------------------------------------------------------------------  Indications: Progressive swelling.  Risk Factors: DVT 05/15/2023 Acute Right popliteal, PT, Peroneal, and soleal veins and left external iliac, common femoral, SF junction, femoral, profunda, popliteal, PT, peroneal, and gastroc veins. Limitations: Bowel gas and body habitus. Comparison Study: Prior study done 05/15/2023 Performing Technologist: Sherren Kerns RVS  Examination Guidelines: A complete evaluation includes B-mode imaging, spectral Doppler, color Doppler, and power Doppler as needed of all accessible portions of each vessel. Bilateral testing is considered an integral part of a complete examination. Limited examinations for reoccurring indications may be performed as noted. The reflux portion of the exam is performed with the patient in reverse Trendelenburg.  +-----+---------------+---------+-----------+----------+--------------+ RIGHTCompressibilityPhasicitySpontaneityPropertiesThrombus Aging +-----+---------------+---------+-----------+----------+--------------+ CFV  Full           Yes      Yes                                 +-----+---------------+---------+-----------+----------+--------------+   +---------+---------------+---------+-----------+----------+--------------+ LEFT     CompressibilityPhasicitySpontaneityPropertiesThrombus Aging +---------+---------------+---------+-----------+----------+--------------+ CFV      None           No       No                   Acute          +---------+---------------+---------+-----------+----------+--------------+ SFJ      None           No       No  Acute          +---------+---------------+---------+-----------+----------+--------------+ FV Prox  None            No       No                   Acute          +---------+---------------+---------+-----------+----------+--------------+ FV Mid   None           No       No                   Acute          +---------+---------------+---------+-----------+----------+--------------+ FV DistalNone           No       No                   Acute          +---------+---------------+---------+-----------+----------+--------------+ PFV      None           No       No                   Acute          +---------+---------------+---------+-----------+----------+--------------+ POP      None           No       No                   Acute          +---------+---------------+---------+-----------+----------+--------------+ PTV      None                                         Acute          +---------+---------------+---------+-----------+----------+--------------+ PERO     None                                         Acute          +---------+---------------+---------+-----------+----------+--------------+ Gastroc  None           No       No                   Acute          +---------+---------------+---------+-----------+----------+--------------+ EIV      None                                         Acute          +---------+---------------+---------+-----------+----------+--------------+ CIV                                                   not visualized +---------+---------------+---------+-----------+----------+--------------+    Summary: RIGHT: - Findings appear essentially unchanged compared to previous examination. - There is no evidence of deep vein thrombosis in the lower extremity.  LEFT: - Findings consistent with acute deep vein thrombosis involving the left common femoral vein, SF junction, left femoral vein, left proximal profunda  vein, left popliteal vein, left posterior tibial veins, left peroneal veins, left gastrocnemius veins, and EIV. - Findings appear  essentially unchanged compared to previous examination.  *See table(s) above for measurements and observations.    Preliminary    VAS [place] LOWER EXTREMITY VENOUS (DVT)  Result Date: 05/15/2023  Lower Venous DVT Study Patient Name:  Heather Hudson  Date of Exam:   05/15/2023 Medical Rec #: [id number]      Accession #:    [id number] Date of Birth: 23-Dec-1955       Patient Gender: F Patient Age:   49 years Exam Location:  [facility] Procedure:      VAS [place] LOWER EXTREMITY VENOUS (DVT) Referring Phys: [facility] GOEL --------------------------------------------------------------------------------  Indications: Pulmonary embolism, and left foot swelling.  Anticoagulation: Heparin. Limitations: Body habitus and poor ultrasound/tissue interface. Comparison Study: No previous study. Performing Technologist: McKayla Maag RVT, VT  Examination Guidelines: A complete evaluation includes B-mode imaging, spectral Doppler, color Doppler, and power Doppler as needed of all accessible portions of each vessel. Bilateral testing is considered an integral part of a complete examination. Limited examinations for reoccurring indications may be performed as noted. The reflux portion of the exam is performed with the patient in reverse Trendelenburg.  +---------+---------------+---------+-----------+----------+--------------+ RIGHT    CompressibilityPhasicitySpontaneityPropertiesThrombus Aging +---------+---------------+---------+-----------+----------+--------------+ CFV      Full           Yes      Yes                                 +---------+---------------+---------+-----------+----------+--------------+ SFJ      Full                                                        +---------+---------------+---------+-----------+----------+--------------+ FV Prox  Full                                                        +---------+---------------+---------+-----------+----------+--------------+ FV Mid   Full                                                         +---------+---------------+---------+-----------+----------+--------------+ FV DistalFull                                                        +---------+---------------+---------+-----------+----------+--------------+ PFV      Full                                                        +---------+---------------+---------+-----------+----------+--------------+ POP      Partial  Yes      Yes                  Acute          +---------+---------------+---------+-----------+----------+--------------+ PTV      None           No       No                   Acute          +---------+---------------+---------+-----------+----------+--------------+ PERO     None           No       No                   Acute          +---------+---------------+---------+-----------+----------+--------------+ Soleal   None           No       No                   Acute          +---------+---------------+---------+-----------+----------+--------------+ EIV      Full                                                        +---------+---------------+---------+-----------+----------+--------------+ CIV      Full                                                        +---------+---------------+---------+-----------+----------+--------------+   +---------+---------------+---------+-----------+----------+--------------+ LEFT     CompressibilityPhasicitySpontaneityPropertiesThrombus Aging +---------+---------------+---------+-----------+----------+--------------+ CFV      None           No       No                   Acute          +---------+---------------+---------+-----------+----------+--------------+ SFJ      None           No       No                   Acute          +---------+---------------+---------+-----------+----------+--------------+ FV Prox  None           No       No                   Acute           +---------+---------------+---------+-----------+----------+--------------+ FV Mid   None           No       No                   Acute          +---------+---------------+---------+-----------+----------+--------------+ FV DistalNone           No       No                   Acute          +---------+---------------+---------+-----------+----------+--------------+ PFV      None  No       No                   Acute          +---------+---------------+---------+-----------+----------+--------------+ POP      None           No       No                   Acute          +---------+---------------+---------+-----------+----------+--------------+ PTV      None           No       No                   Acute          +---------+---------------+---------+-----------+----------+--------------+ PERO                                                  Not visualized +---------+---------------+---------+-----------+----------+--------------+ Gastroc  None           No       No                   Acute          +---------+---------------+---------+-----------+----------+--------------+ EIV      None           No       No                   Acute          +---------+---------------+---------+-----------+----------+--------------+ CIV                                                   Not visualized +---------+---------------+---------+-----------+----------+--------------+     Summary: RIGHT: - Findings consistent with acute deep vein thrombosis involving the right popliteal vein, right posterior tibial veins, right peroneal veins, and right soleal veins. The thrombus located in the right popliteal vein appears to be loosely attached to the vessel wall. - No cystic structure found in the popliteal fossa.  LEFT: - Findings consistent with acute deep vein thrombosis involving the left external iliac vein, left common femoral vein, SF junction, left femoral vein,  left proximal profunda vein, left popliteal vein, left posterior tibial veins, left peroneal veins, and left gastrocnemius veins. - No cystic structure found in the popliteal fossa.  *See table(s) above for measurements and observations. Electronically signed by Gerarda Fraction on 05/15/2023 at 3:34:50 PM.    Final    ECHOCARDIOGRAM LIMITED  Result Date: 05/15/2023    ECHOCARDIOGRAM LIMITED REPORT   Patient Name:   Heather Hudson Date of Exam: 05/15/2023 Medical Rec #:  [id number]     Height:       68.0 in Accession #:    [id number]    Weight:       179.0 lb Date of Birth:  1955/11/07      BSA:          1.950 m Patient Age:    66 years      BP:           122/61 mmHg Patient Gender: F  HR:           84 bpm. Exam Location:  Inpatient Procedure: Limited Echo, Limited Color Doppler and Cardiac Doppler Indications:    Pulmonary Embolus  History:        Patient has prior history of Echocardiogram examinations, most                 recent 04/20/2023. Signs/Symptoms:Fatigue and Shortness of                 Breath; Risk Factors:Hypertension and Dyslipidemia.  Sonographer:    Wallie Char Referring Phys: Melody Comas, B IMPRESSIONS  1. Right ventricular systolic function is mildly reduced. The right ventricular size is normal. There is moderately elevated pulmonary artery systolic pressure. The estimated right ventricular systolic pressure is 49.5 mmHg.  2. Left ventricular ejection fraction, by estimation, is 60 to 65%. The left ventricle has normal function. The left ventricle has no regional wall motion abnormalities.  3. The mitral valve is grossly normal. Trivial mitral valve regurgitation. No evidence of mitral stenosis.  4. The aortic valve is tricuspid. Aortic valve regurgitation is trivial. No aortic stenosis is present.  5. The inferior vena cava is normal in size with greater than 50% respiratory variability, suggesting right atrial pressure of 3 mmHg. Comparison(s): Changes from prior study are noted. LV  function unchanged. RV function and size unchanged. RVSP elevated up to 49 mmHG on this study. FINDINGS  Left Ventricle: Left ventricular ejection fraction, by estimation, is 60 to 65%. The left ventricle has normal function. The left ventricle has no regional wall motion abnormalities. The left ventricular internal cavity size was normal in size. There is  no left ventricular hypertrophy. Right Ventricle: The right ventricular size is normal. No increase in right ventricular wall thickness. Right ventricular systolic function is mildly reduced. There is moderately elevated pulmonary artery systolic pressure. The tricuspid regurgitant velocity is 3.41 m/s, and with an assumed right atrial pressure of 3 mmHg, the estimated right ventricular systolic pressure is 49.5 mmHg. Left Atrium: Left atrial size was normal in size. Right Atrium: Right atrial size was normal in size. Pericardium: There is no evidence of pericardial effusion. Presence of epicardial fat layer. Mitral Valve: The mitral valve is grossly normal. Trivial mitral valve regurgitation. No evidence of mitral valve stenosis. MV peak gradient, 4.8 mmHg. The mean mitral valve gradient is 2.0 mmHg. Tricuspid Valve: The tricuspid valve is grossly normal. Tricuspid valve regurgitation is mild . No evidence of tricuspid stenosis. Aortic Valve: The aortic valve is tricuspid. Aortic valve regurgitation is trivial. No aortic stenosis is present. Aortic valve mean gradient measures 5.0 mmHg. Aortic valve peak gradient measures 8.9 mmHg. Aortic valve area, by VTI measures 2.14 cm. Aorta: The aortic root is normal in size and structure. Venous: The inferior vena cava is normal in size with greater than 50% respiratory variability, suggesting right atrial pressure of 3 mmHg. IAS/Shunts: The atrial septum is grossly normal. Additional Comments: Spectral Doppler performed. Color Doppler performed.  LEFT VENTRICLE PLAX 2D LVIDd:         4.90 cm LVIDs:         3.20 cm LV  PW:         1.00 cm LV IVS:        1.00 cm LVOT diam:     1.80 cm LV SV:         54 LV SV Index:   28 LVOT Area:     2.54 cm  LV Volumes (MOD) LV vol d, MOD A2C: 77.8 ml LV vol d, MOD A4C: 79.9 ml LV vol s, MOD A2C: 27.4 ml LV vol s, MOD A4C: 28.8 ml LV SV MOD A2C:     50.4 ml LV SV MOD A4C:     79.9 ml LV SV MOD BP:      51.1 ml RIGHT VENTRICLE             IVC RV Basal diam:  3.50 cm     IVC diam: 1.30 cm RV S prime:     11.50 cm/s TAPSE (M-mode): 1.5 cm LEFT ATRIUM         Index       RIGHT ATRIUM           Index LA diam:    3.00 cm 1.54 cm/m  RA Area:     11.90 cm                                 RA Volume:   23.60 ml  12.10 ml/m  AORTIC VALVE AV Area (Vmax):    2.15 cm AV Area (Vmean):   1.95 cm AV Area (VTI):     2.14 cm AV Vmax:           149.00 cm/s AV Vmean:          111.000 cm/s AV VTI:            0.254 m AV Peak Grad:      8.9 mmHg AV Mean Grad:      5.0 mmHg LVOT Vmax:         126.00 cm/s LVOT Vmean:        84.900 cm/s LVOT VTI:          0.214 m LVOT/AV VTI ratio: 0.84  AORTA Ao Root diam: 3.40 cm MITRAL VALVE               TRICUSPID VALVE MV Area (PHT): 3.34 cm    TR Peak grad:   46.5 mmHg MV Area VTI:   2.35 cm    TR Vmax:        341.00 cm/s MV Peak grad:  4.8 mmHg MV Mean grad:  2.0 mmHg    SHUNTS MV Vmax:       1.09 m/s    Systemic VTI:  0.21 m MV Vmean:      56.7 cm/s   Systemic Diam: 1.80 cm MV Decel Time: 227 msec MV E velocity: 71.80 cm/s MV A velocity: 95.20 cm/s MV E/A ratio:  0.75 Lennie Odor MD Electronically signed by Lennie Odor MD Signature Date/Time: 05/15/2023/1:58:47 PM    Final    CT Angio Chest PE W and/or Wo Contrast  Result Date: 05/14/2023 CLINICAL DATA:  Pulmonary embolism (PE) suspected, high prob EXAM: CT ANGIOGRAPHY CHEST WITH CONTRAST TECHNIQUE: Multidetector CT imaging of the chest was performed using the standard protocol during bolus administration of intravenous contrast. Multiplanar CT image reconstructions and MIPs were obtained to evaluate the vascular  anatomy. RADIATION DOSE REDUCTION: This exam was performed according to the departmental dose-optimization program which includes automated exposure control, adjustment of the mA and/or kV according to patient size and/or use of iterative reconstruction technique. CONTRAST:  75mL OMNIPAQUE IOHEXOL 350 MG/ML SOLN COMPARISON:  05/05/2023 FINDINGS: Cardiovascular: Satisfactory opacification of the pulmonary arteries. Extensive bilateral pulmonary emboli involving both main pulmonary arteries, right greater than left. Thrombus involves  numerous lobar, segmental, and subsegmental branch pulmonary arteries bilaterally. No saddle embolism. Mildly elevated RV to LV ratio of 1.05. Heart size is normal. No pericardial effusion. Thoracic aorta is nonaneurysmal. Mediastinum/Nodes: No enlarged mediastinal, hilar, or axillary lymph nodes. Thyroid gland, trachea, and esophagus demonstrate no significant findings. Lungs/Pleura: Wedge-shaped airspace consolidation with air bronchograms within the right middle lobe. Additional smaller areas of airspace consolidation within the periphery of the right upper lobe and superior aspect of the right lower lobe. Bibasilar atelectasis. No pleural effusion or pneumothorax. Upper Abdomen: No acute abnormality. Musculoskeletal: No chest wall abnormality. No acute or significant osseous findings. Review of the MIP images confirms the above findings. IMPRESSION: 1. Positive for acute bilateral PE with CT evidence of right heart strain (RV/LV Ratio = 1.05) consistent with at least submassive (intermediate risk) PE. The presence of right heart strain has been associated with an increased risk of morbidity and mortality. Please refer to the "Code PE Focused" order set in EPIC. 2. Wedge-shaped airspace consolidation within the right middle lobe with additional smaller areas of airspace consolidation within the periphery of the right upper lobe and superior aspect of the right lower lobe. Findings are  most concerning for pulmonary infarcts. Multifocal pneumonia could also result in this appearance in the appropriate clinical setting. Critical Value/emergent results were called by telephone at the time of interpretation on 05/14/2023 at 7:10 pm to provider Dr. Tanda Rockers , who verbally acknowledged these results. Electronically Signed   By: Duanne Guess D.O.   On: 05/14/2023 19:10   DG Chest 2 View  Result Date: 05/11/2023 CLINICAL DATA:  Exertional dyspnea. EXAM: CHEST - 2 VIEW COMPARISON:  01/07/2021.  CT, 04/20/2023. FINDINGS: Cardiac silhouette is normal in size and configuration. Prominent right anterior cardiophrenic angle fat pad. No mediastinal or hilar masses. No evidence of adenopathy. Clear lungs.  No pleural effusion or pneumothorax. Skeletal structures are unremarkable. IMPRESSION: No active cardiopulmonary disease. Electronically Signed   By: Amie Portland M.D.   On: 05/11/2023 11:41   CT CARDIAC SCORING (SELF PAY ONLY)  Addendum Date: 04/27/2023   ADDENDUM REPORT: 04/27/2023 00:29 EXAM: OVER-READ INTERPRETATION  CT CHEST The following report is an over-read performed by radiologist Dr. Alcide Clever of [facility] on 04/27/2023. This over-read does not include interpretation of cardiac or coronary anatomy or pathology. The coronary calcium score interpretation by the cardiologist is attached. COMPARISON:  None. FINDINGS: Cardiovascular: There are no significant extracardiac vascular findings. Mediastinum/Nodes: There are no enlarged lymph nodes within the visualized mediastinum. Lungs/Pleura: There is no pleural effusion. The visualized lungs appear clear. Upper abdomen: No significant findings in the visualized upper abdomen. Musculoskeletal/Chest wall: No chest wall mass or suspicious osseous findings within the visualized chest. IMPRESSION: No significant extracardiac findings within the visualized chest. Electronically Signed   By: Alcide Clever M.D.   On: 04/27/2023 00:29    Result Date: 04/27/2023 CLINICAL DATA:  31F for cardiovascular disease risk stratification EXAM: Coronary Calcium Score TECHNIQUE: A gated, non-contrast computed tomography scan of the heart was performed using 3mm slice thickness. Axial images were analyzed on a dedicated workstation. Calcium scoring of the coronary arteries was performed using the Agatston method. FINDINGS: Coronary Calcium Score: 0 Pericardium: Normal. Mild aortic atherosclerosis. Non-cardiac: See separate report from [facility]. IMPRESSION: 1. Coronary calcium score of 0. This was 1st percentile for age-, race-, and sex-matched controls. 2. Mild aortic atherosclerosis. RECOMMENDATIONS: Coronary artery calcium (CAC) score is a strong predictor of incident coronary heart disease (  CHD) and provides predictive information beyond traditional risk factors. CAC scoring is reasonable to use in the decision to withhold, postpone, or initiate statin therapy in intermediate-risk or selected borderline-risk asymptomatic adults (age 64-75 years and LDL-C >=70 to <190 mg/dL) who do not have diabetes or established atherosclerotic cardiovascular disease (ASCVD).* In intermediate-risk (10-year ASCVD risk >=7.5% to <20%) adults or selected borderline-risk (10-year ASCVD risk >=5% to <7.5%) adults in whom a CAC score is measured for the purpose of making a treatment decision the following recommendations have been made: If CAC=0, it is reasonable to withhold statin therapy and reassess in 5 to 10 years, as long as higher risk conditions are absent (diabetes mellitus, family history of premature CHD in first degree relatives (males <55 years; females <65 years), cigarette smoking, or LDL >=190 mg/dL). If CAC is 1 to 99, it is reasonable to initiate statin therapy for patients >=7 years of age. If CAC is >=100 or >=75th percentile, it is reasonable to initiate statin therapy at any age. Cardiology referral should be considered for patients with CAC  scores >=400 or >=75th percentile. *2018 AHA/ACC/AACVPR/AAPA/ABC/ACPM/ADA/AGS/APhA/ASPC/NLA/PCNA Guideline on the Management of Blood Cholesterol: A Report of the American College of Cardiology/American Heart Association Task Force on Clinical Practice Guidelines. J Am Coll Cardiol. 2019;73(24):3168-3209. Chilton Si, MD Electronically Signed: By: Chilton Si M.D. On: 04/20/2023 16:44   ECHOCARDIOGRAM COMPLETE  Result Date: 04/20/2023    ECHOCARDIOGRAM REPORT   Patient Name:   Heather Hudson Date of Exam: 04/20/2023 Medical Rec #:  [id number]     Height:       68.0 in Accession #:    [id number]    Weight:       186.8 lb Date of Birth:  09-Oct-1955      BSA:          1.985 m Patient Age:    66 years      BP:           144/95 mmHg Patient Gender: F             HR:           72 bpm. Exam Location:  Outpatient Procedure: 2D Echo, 3D Echo, Cardiac Doppler and Color Doppler Indications:    R06.9 DOE; R94.31 Abnormal EKG; R53.83 Fatigue  History:        Patient has no prior history of Echocardiogram examinations.                 Abnormal ECG, Signs/Symptoms:Dyspnea and Fatigue; Risk                 Factors:Hypertension, Dyslipidemia and Non-Smoker. Patient                 denies chest pain and leg edema. She does have DOE. Patient                 complains of fatigue for about 6 weeks which she attributes it                 to taking prednisnone since April 2024.  Sonographer:    Carlos American RVT, RDCS (AE), RDMS Referring Phys: [id number] PHILIP J NAHSER  Sonographer Comments: No parasternal window and suboptimal apical window. IMPRESSIONS  1. Left ventricular ejection fraction, by estimation, is 70 to 75%. Left ventricular ejection fraction by 3D volume is 72 %. The left ventricle has hyperdynamic function. The left ventricle has no regional wall motion abnormalities. There is mild left ventricular  hypertrophy. Left ventricular diastolic parameters are consistent with Grade I diastolic dysfunction (impaired  relaxation).  2. Right ventricular systolic function is mildly reduced. The right ventricular size is normal. Tricuspid regurgitation signal is inadequate for assessing PA pressure.  3. The mitral valve is grossly normal. Trivial mitral valve regurgitation.  4. The aortic valve is tricuspid. Aortic valve regurgitation is trivial. Aortic valve sclerosis is present, with no evidence of aortic valve stenosis. Aortic regurgitation PHT measures 486 msec.  5. The inferior vena cava is normal in size with greater than 50% respiratory variability, suggesting right atrial pressure of 3 mmHg. Comparison(s): No prior Echocardiogram. FINDINGS  Left Ventricle: Left ventricular ejection fraction, by estimation, is 70 to 75%. Left ventricular ejection fraction by 3D volume is 72 %. The left ventricle has hyperdynamic function. The left ventricle has no regional wall motion abnormalities. The left ventricular internal cavity size was normal in size. There is mild left ventricular hypertrophy. Left ventricular diastolic parameters are consistent with Grade I diastolic dysfunction (impaired relaxation). Indeterminate filling pressures. Right Ventricle: The right ventricular size is normal. No increase in right ventricular wall thickness. Right ventricular systolic function is mildly reduced. Tricuspid regurgitation signal is inadequate for assessing PA pressure. Left Atrium: Left atrial size was normal in size. Right Atrium: Right atrial size was normal in size. Pericardium: There is no evidence of pericardial effusion. Presence of epicardial fat layer. Mitral Valve: The mitral valve is grossly normal. Trivial mitral valve regurgitation. Tricuspid Valve: The tricuspid valve is grossly normal. Tricuspid valve regurgitation is trivial. Aortic Valve: The aortic valve is tricuspid. Aortic valve regurgitation is trivial. Aortic regurgitation PHT measures 486 msec. Aortic valve sclerosis is present, with no evidence of aortic valve  stenosis. Aortic valve mean gradient measures 5.0 mmHg. Aortic valve peak gradient measures 10.8 mmHg. Aortic valve area, by VTI measures 3.84 cm. Pulmonic Valve: The pulmonic valve was normal in structure. Pulmonic valve regurgitation is not visualized. Aorta: The aortic root and ascending aorta are structurally normal, with no evidence of dilitation. Venous: The inferior vena cava is normal in size with greater than 50% respiratory variability, suggesting right atrial pressure of 3 mmHg. IAS/Shunts: No atrial level shunt detected by color flow Doppler.  LEFT VENTRICLE PLAX 2D LVOT diam:     2.50 cm         Diastology LV SV:         103             LV e' medial:    4.33 cm/s LV SV Index:   52              LV E/e' medial:  13.2 LVOT Area:     4.91 cm        LV e' lateral:   9.41 cm/s                                LV E/e' lateral: 6.1                                 3D Volume EF                                LV 3D EF:    Left  ventricul                                             ar                                             ejection                                             fraction                                             by 3D                                             volume is                                             72 %.                                 3D Volume EF:                                3D EF:        72 %                                LV EDV:       69 ml                                LV ESV:       19 ml                                LV SV:        50 ml RIGHT VENTRICLE RV S prime:     10.90 cm/s TAPSE (M-mode): 1.3 cm LEFT ATRIUM             Index        RIGHT ATRIUM           Index LA Vol (A2C):   53.1 ml 26.75 ml/m  RA Area:     11.50 cm LA Vol (A4C):   35.1 ml 17.68 ml/m  RA Volume:   22.80 ml  11.48 ml/m LA Biplane Vol: 42.4 ml 21.36 ml/m  AORTIC VALVE                     PULMONIC VALVE AV Area (Vmax):  3.65 cm      PV Vmax:        1.16 m/s AV Area (Vmean):   3.90 cm      PV Peak grad:  5.4 mmHg AV Area (VTI):     3.84 cm AV Vmax:           164.00 cm/s AV Vmean:          105.000 cm/s AV VTI:            0.267 m AV Peak Grad:      10.8 mmHg AV Mean Grad:      5.0 mmHg LVOT Vmax:         122.00 cm/s LVOT Vmean:        83.500 cm/s LVOT VTI:          0.209 m LVOT/AV VTI ratio: 0.78 AI PHT:            486 msec  AORTA Ao Root diam: 3.30 cm Ao Arch diam: 3.4 cm MITRAL VALVE MV Area (PHT): 2.80 cm    SHUNTS MV Decel Time: 271 msec    Systemic VTI:  0.21 m MV E velocity: 57.20 cm/s  Systemic Diam: 2.50 cm MV A velocity: 91.60 cm/s MV E/A ratio:  0.62 Zoila Shutter MD Electronically signed by Zoila Shutter MD Signature Date/Time: 04/20/2023/4:05:43 PM    Final     Labs:  CBC: Recent Labs    05/15/23 0146 05/16/23 0413 05/17/23 0348 05/18/23 0202  WBC 9.3 6.4 6.7 6.1  HGB 11.7* 9.2* 8.7* 9.4*  HCT 34.1* 27.7* 26.6* 28.0*  PLT 162 180 223 273    COAGS: Recent Labs    05/14/23 1930 05/15/23 0146  INR 1.1 1.1  APTT 32 65*    BMP: Recent Labs    07/15/22 0910 01/20/23 0830 05/14/23 1749 05/15/23 0146  NA 139 138 130* 133*  K 4.1 3.7 4.5 3.3*  CL 107 104 100 103  CO2 24 28 21* 18*  GLUCOSE 106* 77 407* 285*  BUN 17 21 33* 25*  CALCIUM 10.5 10.8* 10.4* 9.6  CREATININE 0.76 0.68 0.69 0.68  GFRNONAA  --   --  >60 >60    LIVER FUNCTION TESTS: Recent Labs    01/20/23 0830 05/14/23 1749 05/15/23 0146  BILITOT 0.6 1.5* 1.3*  AST 22 26 21   ALT 42* 42 39  ALKPHOS 80 74 74  PROT 6.9 6.5 5.9*  ALBUMIN 4.3 3.7 3.0*    TUMOR MARKERS: No results for input(s): "AFPTM", "CEA", "CA199", "CHROMGRNA" in the last 8760 hours.  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  She was found to have acute pulmonary embolus with right heart strain.  She was started on  IV heparin with improvement.  LE dopplers show bilateral acute DVT worse on the left-- deep vein thrombosis involving the left external iliac vein, left common femoral vein, SF junction, left femoral  vein, left proximal profunda vein, left popliteal vein, left posterior tibial veins, left peroneal veins,  and left gastrocnemius veins.   Since admission and initiating IV heparin, she has noted worsening swelling in the left lower extremity.  IR consulted for lysis vs. Thrombectomy. Case reviewed by Dr. Bryn Gulling and Dr. Loreta Ave.  Dr. Loreta Ave has also spoken with the patient and her husband at bedside. They are agreeable to proceed.   CT Venogram ordered to assess extent  of pelvic clot as we as for procedural planning.   Advance Care Plan: The advanced care plan/surrogate decision maker was discussed at the time of visit and documented in the medical record.   Of note, patient with episodes of afib with RVR this afternoon. EKGs ordered.  Troponins ordered; pending.    Patient and husband aware of plan to continue IV heparin overnight. Continue with venogram.  Further cardiac work-up per primary team.   Risks and benefits discussed with the patient including, but not limited to bleeding, possible life threatening bleeding and need for blood product transfusion, vascular injury, stroke, contrast induced renal failure, limb loss and infection.  All of the patient's questions were answered, patient is agreeable to proceed. Consent signed and in chart.  Thank you for this interesting consult.  I greatly enjoyed meeting Heather Hudson and look forward to participating in their care.  A copy of this report was sent to the requesting provider on this date.  Electronically Signed: Hoyt Koch, PA 05/18/2023, 11:38 AM   I spent a total of 40 Minutes    in face to face in clinical consultation, greater than 50% of which was counseling/coordinating care for DVT, pulmonary embolus

## 2023-05-18 NOTE — Progress Notes (Signed)
PROGRESS NOTE    Heather Hudson  NFA:[id number] DOB: 31-Oct-1955 DOA: 05/14/2023 PCP: [name], NP  Outpatient Specialists:     Brief Narrative:  Patient is a 67 year old female past medical history significant for COVID-19 infection, autoimmune disease/mucocutaneous pemphigoid and remote colovesical fistula.  Patient has been on prednisone on methotrexate.  Patient was admitted with acute pulmonary embolism and bilateral lower extremity DVT.  History of travel to [place] reported.  Patient is currently on heparin.  05/16/2023: Patient seen.  Procalcitonin is less than 0.1.  Will discontinue antibiotics.  Patient was seen alongside patient's husband.  All were updated.  Continue heparin for now.  Likely will continue heparin for the next 24 to 48 hours.  05/17/2023: Patient seen.  Swelling of left lower extremity noted.  Repeat Doppler ultrasound of lower extremity has not shown any changes in clot burden.  Will consult interventional radiology team for possible mechanical thrombectomy and thrombolysis of left lower extremity DVT.  No shortness of breath or chest pain reported.  05/18/2023: Patient seen.  Patient was noted to have gone into brief atrial fibrillation.  No prior history of atrial fibrillation.  Will continue telemetry monitoring for now.  Heart rate is controlled.  Patient is on heparin.  Interventional radiology input is appreciated.  CT venogram of the abdomen/pelvis planned.  Likely thrombectomy versus lysis tomorrow.  Assessment & Plan:   Principal Problem:   Acute pulmonary embolism (HCC) Active Problems:   Primary hypertension   Mucous membrane pemphigoid   Pulmonary embolism (HCC)   Hyperglycemia   Hypercalcemia   Acute pulmonary embolism (HCC) -Without any hemodynamic instability.  However associated pulmonary infarcts as well as associated borderline questionable hypoxemia.  Patient being treated with supplementary oxygen with plan to check orthostatic in  the morning as well as ambulatory pulse oximetry.  Patient has been started on heparin infusion, we will monitor patient's clinical evolution on this.  I advised patient to report any new symptoms including but not limited to headache, new back pain any neurologic symptoms on the lower extremity such as tingling or weakness or bladder or bowel changes or any black liquid stool to the staff right away. Check lower extremity dopplers.   Patient is noted to have leukocytosis, sinus tachycardia, and consolidation on CAT scan.  This is likely due to concern infarction of the lung.  However patient is immunocompromise getting steroid therapy as well as methotrexate.  At this time therefore I will check blood cultures and start empiric treatment for pneumonia 05/15/2023: Continue heparin.  Likely follow-up with hematology team on discharge.  Incentive spirometry. 05/18/2023: Continue heparin.   Hypercalcemia Very mild, incidental.  I will check PTH, vitamin D Phos morning. Noted on April 16th as well. 05/15/2023: Resolved.   Hyperglycemia This is likely been precipitated by patient being started on steroid therapy recently.  See below.  I will maintain the patient on insulin sliding scale at this time. 05/15/2023: Add Semglee.  A1c 7.6%.  Consult diabetic educator.  Patient has been on steroids.   Mucous membrane pemphigoid [name], MD - 04/23/2023 8:45 AM EDT Formatting of this note is different from the original. Images from the original note were not included. Dermatology Note  05/15/2023: Hematology team is managing.  Extensive DVT left lower extremity: -Consult IR for possible mechanical thrombectomy and thrombolysis. -Will keep patient n.p.o. after midnight. 05/18/2023: For thrombectomy/thrombolysis of the left lower extremity DVT tomorrow by IR.  Brief paroxysmal atrial fibrillation: -Asymptomatic. -Negative troponin. -Rate controlled. -  Patient is on heparin drip.   DVT prophylaxis:  Heparin. Code Status: Full code. Family Communication: Sister. Disposition Plan: Home eventually.  Remains inpatient.   Consultants:  None for now. Follow-up with hematology on discharge. Interventional radiology team for possible mechanical thrombectomy and thrombolysis of left lower extremity DVT.  Procedures:  None.  Antimicrobials:  IV Unasyn discontinued.    Subjective: -No fever or chills. -No shortness of breath. -Asymptomatic brief A-fib reported.  Objective: Vitals:   05/18/23 0526 05/18/23 0711 05/18/23 1052 05/18/23 1622  BP: (!) 160/89 (!) 153/87 (!) 142/78 (!) 153/82  Pulse: 96 70 62 74  Resp:  19 19 20   Temp: 98.5 F (36.9 C) 98.2 F (36.8 C) 98.3 F (36.8 C) 99 F (37.2 C)  TempSrc: Oral Oral Oral Axillary  SpO2:  96% 98% 98%  Weight:      Height:        Intake/Output Summary (Last 24 hours) at 05/18/2023 1630 Last data filed at 05/18/2023 1628 Gross per 24 hour  Intake 1527.36 ml  Output 1850 ml  Net -322.64 ml   Filed Weights   05/14/23 1626  Weight: 81.2 kg    Examination: General exam: Appears calm and comfortable.  Patient is obese.  Cushingoid facies. Respiratory system: Clear to auscultation.  Cardiovascular system: S1 & S2 heard. Gastrointestinal system: Abdomen is obese, soft and nontender.   Central nervous system: Awake and alert.  Patient was all extremities.   Extremities: Swelling of left lower extremity.   Data Reviewed: I have personally reviewed following labs and imaging studies  CBC: Recent Labs  Lab 05/14/23 1749 05/15/23 0146 05/16/23 0413 05/17/23 0348 05/18/23 0202  WBC 13.1* 9.3 6.4 6.7 6.1  NEUTROABS 11.7*  --   --   --   --   HGB 11.7* 11.7* 9.2* 8.7* 9.4*  HCT 33.0* 34.1* 27.7* 26.6* 28.0*  MCV 94.3 93.9 100.4* 98.2 97.9  PLT 188 162 180 223 273   Basic Metabolic Panel: Recent Labs  Lab 05/14/23 1749 05/15/23 0146 05/18/23 1129  NA 130* 133* 141  K 4.5 3.3* 3.6  CL 100 103 112*  CO2 21* 18*  18*  GLUCOSE 407* 285* 124*  BUN 33* 25* 9  CREATININE 0.69 0.68 0.59  CALCIUM 10.4* 9.6 9.4  MG  --   --  1.9  PHOS  --  2.9 3.4   GFR: Estimated Creatinine Clearance: 78.4 mL/min (by C-G formula based on SCr of 0.59 mg/dL). Liver Function Tests: Recent Labs  Lab 05/14/23 1749 05/15/23 0146 05/18/23 1129  AST 26 21  --   ALT 42 39  --   ALKPHOS 74 74  --   BILITOT 1.5* 1.3*  --   PROT 6.5 5.9*  --   ALBUMIN 3.7 3.0* 2.6*   No results for input(s): "LIPASE", "AMYLASE" in the last 168 hours. No results for input(s): "AMMONIA" in the last 168 hours. Coagulation Profile: Recent Labs  Lab 05/14/23 1930 05/15/23 0146  INR 1.1 1.1   Cardiac Enzymes: No results for input(s): "CKTOTAL", "CKMB", "CKMBINDEX", "TROPONINI" in the last 168 hours. BNP (last 3 results) No results for input(s): "PROBNP" in the last 8760 hours. HbA1C: No results for input(s): "HGBA1C" in the last 72 hours.  CBG: Recent Labs  Lab 05/17/23 2116 05/18/23 0000 05/18/23 0533 05/18/23 1055 05/18/23 1619  GLUCAP 175* 141* 129* 151* 124*   Lipid Profile: No results for input(s): "CHOL", "HDL", "LDLCALC", "TRIG", "CHOLHDL", "LDLDIRECT" in the last 72 hours. Thyroid  Function Tests: No results for input(s): "TSH", "T4TOTAL", "FREET4", "T3FREE", "THYROIDAB" in the last 72 hours. Anemia Panel: No results for input(s): "VITAMINB12", "FOLATE", "FERRITIN", "TIBC", "IRON", "RETICCTPCT" in the last 72 hours. Urine analysis: No results found for: "COLORURINE", "APPEARANCEUR", "LABSPEC", "PHURINE", "GLUCOSEU", "HGBUR", "BILIRUBINUR", "KETONESUR", "PROTEINUR", "UROBILINOGEN", "NITRITE", "LEUKOCYTESUR" Sepsis Labs: @LABRCNTIP (procalcitonin:4,lacticidven:4)  ) Recent Results (from the past 240 hour(s))  Resp panel by RT-PCR (RSV, Flu A&B, Covid) Anterior Nasal Swab     Status: None   Collection Time: 05/14/23  5:50 PM   Specimen: Anterior Nasal Swab  Result Value Ref Range Status   SARS Coronavirus 2 by RT  PCR NEGATIVE NEGATIVE Final    Comment: (NOTE) SARS-CoV-2 target nucleic acids are NOT DETECTED.  The SARS-CoV-2 RNA is generally detectable in upper respiratory specimens during the acute phase of infection. The lowest concentration of SARS-CoV-2 viral copies this assay can detect is 138 copies/mL. A negative result does not preclude SARS-Cov-2 infection and should not be used as the sole basis for treatment or other patient management decisions. A negative result may occur with  improper specimen collection/handling, submission of specimen other than nasopharyngeal swab, presence of viral mutation(s) within the areas targeted by this assay, and inadequate number of viral copies(<138 copies/mL). A negative result must be combined with clinical observations, patient history, and epidemiological information. The expected result is Negative.  Fact Sheet for Patients:  BloggerCourse.com  Fact Sheet for Healthcare Providers:  SeriousBroker.it  This test is no t yet approved or cleared by the [place] FDA and  has been authorized for detection and/or diagnosis of SARS-CoV-2 by FDA under an Emergency Use Authorization (EUA). This EUA will remain  in effect (meaning this test can be used) for the duration of the COVID-19 declaration under Section 564(b)(1) of the Act, 21 U.S.C.section 360bbb-3(b)(1), unless the authorization is terminated  or revoked sooner.       Influenza A by PCR NEGATIVE NEGATIVE Final   Influenza B by PCR NEGATIVE NEGATIVE Final    Comment: (NOTE) The Xpert Xpress SARS-CoV-2/FLU/RSV plus assay is intended as an aid in the diagnosis of influenza from Nasopharyngeal swab specimens and should not be used as a sole basis for treatment. Nasal washings and aspirates are unacceptable for Xpert Xpress SARS-CoV-2/FLU/RSV testing.  Fact Sheet for Patients: BloggerCourse.com  Fact Sheet for  Healthcare Providers: SeriousBroker.it  This test is not yet approved or cleared by the [place] FDA and has been authorized for detection and/or diagnosis of SARS-CoV-2 by FDA under an Emergency Use Authorization (EUA). This EUA will remain in effect (meaning this test can be used) for the duration of the COVID-19 declaration under Section 564(b)(1) of the Act, 21 U.S.C. section 360bbb-3(b)(1), unless the authorization is terminated or revoked.     Resp Syncytial Virus by PCR NEGATIVE NEGATIVE Final    Comment: (NOTE) Fact Sheet for Patients: BloggerCourse.com  Fact Sheet for Healthcare Providers: SeriousBroker.it  This test is not yet approved or cleared by the [place] FDA and has been authorized for detection and/or diagnosis of SARS-CoV-2 by FDA under an Emergency Use Authorization (EUA). This EUA will remain in effect (meaning this test can be used) for the duration of the COVID-19 declaration under Section 564(b)(1) of the Act, 21 U.S.C. section 360bbb-3(b)(1), unless the authorization is terminated or revoked.  Performed at [facility], [address], [place], [place] [address]   Culture, blood (Routine X 2) w Reflex to ID Panel     Status: None (  Preliminary result)   Collection Time: 05/15/23  1:42 AM   Specimen: BLOOD LEFT ARM  Result Value Ref Range Status   Specimen Description BLOOD LEFT ARM  Final   Special Requests   Final    BOTTLES DRAWN AEROBIC AND ANAEROBIC Blood Culture adequate volume   Culture   Final    NO GROWTH 3 DAYS Performed at [facility], [address], [place], [place] [address]    Report Status PENDING  Incomplete  Culture, blood (Routine X 2) w Reflex to ID Panel     Status: None (Preliminary result)   Collection Time: 05/15/23  1:51 AM   Specimen: BLOOD LEFT ARM  Result Value Ref Range Status   Specimen Description BLOOD  LEFT ARM  Final   Special Requests   Final    BOTTLES DRAWN AEROBIC AND ANAEROBIC Blood Culture adequate volume   Culture   Final    NO GROWTH 3 DAYS Performed at [facility], [address] [place], [place] [address]    Report Status PENDING  Incomplete         Radiology Studies: VAS [place] LOWER EXTREMITY VENOUS (DVT)  Result Date: 05/17/2023  Lower Venous DVT Study Patient Name:  RONDA LUBARSKY  Date of Exam:   05/17/2023 Medical Rec #: [id number]      Accession #:    [id number] Date of Birth: [birth year]       Patient Gender: F Patient Age:   24 years Exam Location:  [facility] Procedure:      VAS [place] LOWER EXTREMITY VENOUS (DVT) Referring Phys: Jacqlyn Krauss  --------------------------------------------------------------------------------  Indications: Progressive swelling.  Risk Factors: DVT 05/15/2023 Acute Right popliteal, PT, Peroneal, and soleal veins and left external iliac, common femoral, SF junction, femoral, profunda, popliteal, PT, peroneal, and gastroc veins. Limitations: Bowel gas and body habitus. Comparison Study: Prior study done 05/15/2023 Performing Technologist: Sherren Kerns RVS  Examination Guidelines: A complete evaluation includes B-mode imaging, spectral Doppler, color Doppler, and power Doppler as needed of all accessible portions of each vessel. Bilateral testing is considered an integral part of a complete examination. Limited examinations for reoccurring indications may be performed as noted. The reflux portion of the exam is performed with the patient in reverse Trendelenburg.  +-----+---------------+---------+-----------+----------+--------------+ RIGHTCompressibilityPhasicitySpontaneityPropertiesThrombus Aging +-----+---------------+---------+-----------+----------+--------------+ CFV  Full           Yes      Yes                                 +-----+---------------+---------+-----------+----------+--------------+    +---------+---------------+---------+-----------+----------+--------------+ LEFT     CompressibilityPhasicitySpontaneityPropertiesThrombus Aging +---------+---------------+---------+-----------+----------+--------------+ CFV      None           No       No                   Acute          +---------+---------------+---------+-----------+----------+--------------+ SFJ      None           No       No                   Acute          +---------+---------------+---------+-----------+----------+--------------+ FV Prox  None           No       No  Acute          +---------+---------------+---------+-----------+----------+--------------+ FV Mid   None           No       No                   Acute          +---------+---------------+---------+-----------+----------+--------------+ FV DistalNone           No       No                   Acute          +---------+---------------+---------+-----------+----------+--------------+ PFV      None           No       No                   Acute          +---------+---------------+---------+-----------+----------+--------------+ POP      None           No       No                   Acute          +---------+---------------+---------+-----------+----------+--------------+ PTV      None                                         Acute          +---------+---------------+---------+-----------+----------+--------------+ PERO     None                                         Acute          +---------+---------------+---------+-----------+----------+--------------+ Gastroc  None           No       No                   Acute          +---------+---------------+---------+-----------+----------+--------------+ EIV      None                                         Acute          +---------+---------------+---------+-----------+----------+--------------+ CIV                                                    not visualized +---------+---------------+---------+-----------+----------+--------------+    Summary: RIGHT: - Findings appear essentially unchanged compared to previous examination. - There is no evidence of deep vein thrombosis in the lower extremity.  LEFT: - Findings consistent with acute deep vein thrombosis involving the left common femoral vein, SF junction, left femoral vein, left proximal profunda vein, left popliteal vein, left posterior tibial veins, left peroneal veins, left gastrocnemius veins, and EIV. - Findings appear essentially unchanged compared to previous examination.  *See table(s) above for measurements and observations.    Preliminary         Scheduled Meds:  folic acid  1 mg Oral Daily   insulin aspart  0-15 Units Subcutaneous TID WC   insulin aspart  0-5 Units Subcutaneous QHS   insulin glargine-yfgn  10 Units Subcutaneous Daily   losartan  25 mg Oral Daily   methotrexate  10 mg Oral Weekly   predniSONE  20 mg Oral Q breakfast   sodium chloride flush  3 mL Intravenous Q12H   Continuous Infusions:  sodium chloride 75 mL/hr (05/18/23 0206)   heparin 1,500 Units/hr (05/18/23 1357)     LOS: 4 days    Time spent: 35 minutes.    Berton Mount, MD  [facility] Pager #: [phone] 7PM-7AM contact night coverage as above

## 2023-05-18 NOTE — Care Management Important Message (Signed)
Important Message  Patient Details  Name: Heather Hudson MRN: [id number] Date of Birth: Aug 03, 1956   Medicare Important Message Given:  Yes     Renie Ora 05/18/2023, 9:27 AM

## 2023-05-19 ENCOUNTER — Inpatient Hospital Stay (HOSPITAL_COMMUNITY): Payer: PPO

## 2023-05-19 ENCOUNTER — Other Ambulatory Visit (HOSPITAL_COMMUNITY): Payer: Self-pay

## 2023-05-19 DIAGNOSIS — I2601 Septic pulmonary embolism with acute cor pulmonale: Secondary | ICD-10-CM | POA: Diagnosis not present

## 2023-05-19 HISTORY — PX: IR IVC FILTER PLMT / S&I /IMG GUID/MOD SED: IMG701

## 2023-05-19 HISTORY — PX: IR VENO/EXT/UNI LEFT: IMG675

## 2023-05-19 HISTORY — PX: IR US GUIDE VASC ACCESS RIGHT: IMG2390

## 2023-05-19 HISTORY — PX: IR PTA VENOUS EXCEPT DIALYSIS CIRCUIT: IMG6126

## 2023-05-19 HISTORY — PX: IR INTRAVASCULAR ULTRASOUND NON CORONARY: IMG6085

## 2023-05-19 HISTORY — PX: IR VENOCAVAGRAM IVC: IMG678

## 2023-05-19 HISTORY — PX: IR THROMBECT VENO MECH MOD SED: IMG2300

## 2023-05-19 HISTORY — PX: IR US GUIDE VASC ACCESS LEFT: IMG2389

## 2023-05-19 HISTORY — PX: IR TRANSCATH PLC STENT 1ST ART NOT LE CV CAR VERT CAR: IMG5443

## 2023-05-19 LAB — GLUCOSE, CAPILLARY
Glucose-Capillary: 109 mg/dL — ABNORMAL HIGH (ref 70–99)
Glucose-Capillary: 125 mg/dL — ABNORMAL HIGH (ref 70–99)
Glucose-Capillary: 146 mg/dL — ABNORMAL HIGH (ref 70–99)
Glucose-Capillary: 136 mg/dL — ABNORMAL HIGH (ref 70–99)

## 2023-05-19 MED ORDER — IOHEXOL 300 MG/ML  SOLN
150.0000 mL | Freq: Once | INTRAMUSCULAR | Status: AC | PRN
  Administered 2023-05-19: 110 mL via INTRAVENOUS

## 2023-05-19 MED ORDER — LIDOCAINE-EPINEPHRINE 1 %-1:100000 IJ SOLN
INTRAMUSCULAR | Status: AC
  Filled 2023-05-19: qty 1

## 2023-05-19 MED ORDER — MIDAZOLAM HCL 2 MG/2ML IJ SOLN
INTRAMUSCULAR | Status: AC
  Filled 2023-05-19: qty 2

## 2023-05-19 MED ORDER — FENTANYL CITRATE (PF) 100 MCG/2ML IJ SOLN
INTRAMUSCULAR | Status: AC | PRN
  Administered 2023-05-19 (×3): 25 ug via INTRAVENOUS
  Administered 2023-05-19: 50 ug via INTRAVENOUS
  Administered 2023-05-19: 25 ug via INTRAVENOUS
  Administered 2023-05-19: 50 ug via INTRAVENOUS
  Administered 2023-05-19 (×2): 25 ug via INTRAVENOUS
  Administered 2023-05-19: 50 ug via INTRAVENOUS

## 2023-05-19 MED ORDER — FENTANYL CITRATE (PF) 100 MCG/2ML IJ SOLN
INTRAMUSCULAR | Status: AC
  Filled 2023-05-19: qty 2

## 2023-05-19 MED ORDER — FENTANYL CITRATE (PF) 100 MCG/2ML IJ SOLN
INTRAMUSCULAR | Status: AC
  Filled 2023-05-19: qty 4

## 2023-05-19 MED ORDER — DIPHENHYDRAMINE HCL 50 MG/ML IJ SOLN
INTRAMUSCULAR | Status: AC | PRN
  Administered 2023-05-19: 50 mg via INTRAVENOUS

## 2023-05-19 MED ORDER — ENOXAPARIN SODIUM 80 MG/0.8ML IJ SOSY
80.0000 mg | PREFILLED_SYRINGE | Freq: Two times a day (BID) | INTRAMUSCULAR | Status: DC
  Administered 2023-05-20 – 2023-05-22 (×5): 80 mg via SUBCUTANEOUS
  Filled 2023-05-19 (×6): qty 0.8

## 2023-05-19 MED ORDER — MIDAZOLAM HCL 2 MG/2ML IJ SOLN
INTRAMUSCULAR | Status: AC | PRN
  Administered 2023-05-19: 1 mg via INTRAVENOUS
  Administered 2023-05-19: .5 mg via INTRAVENOUS
  Administered 2023-05-19 (×6): 1 mg via INTRAVENOUS
  Administered 2023-05-19: .5 mg via INTRAVENOUS

## 2023-05-19 MED ORDER — IOHEXOL 300 MG/ML  SOLN
100.0000 mL | Freq: Once | INTRAMUSCULAR | Status: AC | PRN
  Administered 2023-05-19: 10 mL via INTRAVENOUS

## 2023-05-19 MED ORDER — ENOXAPARIN SODIUM 80 MG/0.8ML IJ SOSY
1.0000 mg/kg | PREFILLED_SYRINGE | Freq: Once | INTRAMUSCULAR | Status: AC
  Administered 2023-05-19: 80 mg via SUBCUTANEOUS
  Filled 2023-05-19: qty 0.8

## 2023-05-19 MED ORDER — MIDAZOLAM HCL 2 MG/2ML IJ SOLN
INTRAMUSCULAR | Status: AC
  Filled 2023-05-19: qty 4

## 2023-05-19 MED ORDER — DIPHENHYDRAMINE HCL 50 MG/ML IJ SOLN
INTRAMUSCULAR | Status: AC
  Filled 2023-05-19: qty 1

## 2023-05-19 MED ORDER — LIDOCAINE-EPINEPHRINE 1 %-1:100000 IJ SOLN
20.0000 mL | Freq: Once | INTRAMUSCULAR | Status: AC
  Administered 2023-05-19: 6 mL via INTRADERMAL

## 2023-05-19 NOTE — TOC Benefit Eligibility Note (Signed)
Patient Product/process development scientist completed.    The patient is insured through [facility]. Patient has [employer], may use a copay card, and/or apply for patient assistance if available.    Ran test claim for enoxaparin (Lovenox) 80 mg/0.8 ml and the current 30 day co-pay is $100.00.   This test claim was processed through [facility]- copay amounts may vary at other pharmacies due to pharmacy/plan contracts, or as the patient moves through the different stages of their insurance plan.     Roland Earl, CPHT Pharmacy Patient Advocate Specialist [facility] Patient Advocate Team Direct Number: [phone]  Fax: [phone]

## 2023-05-19 NOTE — Progress Notes (Signed)
ANTICOAGULATION CONSULT NOTE  Pharmacy Consult for Heparin Indication: pulmonary embolus   Allergies  Allergen Reactions   Aspirin Hives   Ibuprofen Hives   Other     Anesthesia (familial) SISTER HAD MALIGNANT HYPERTHERMIA   Propofol Other (See Comments)    Patient Measurements: Height: 5\' 8"  (172.7 cm) Weight: 81.2 kg (179 lb 0.2 oz) IBW/kg (Calculated) : 63.9 Heparin Dosing Weight: 80.3 kg  Vital Signs: Temp: 97.7 F (36.5 C) (08/13 0814) Temp Source: Oral (08/13 0814) BP: 155/89 (08/13 0814) Pulse Rate: 64 (08/13 0821)  Labs: Recent Labs    05/17/23 0348 05/18/23 0202 05/18/23 1129 05/18/23 1529 05/18/23 1722 05/19/23 0119  HGB 8.7* 9.4*  --   --   --  10.0*  HCT 26.6* 28.0*  --   --   --  30.0*  PLT 223 273  --   --   --  285  HEPARINUNFRC 0.33 0.32  --   --   --  0.45  CREATININE  --   --  0.59  --   --   --   TROPONINIHS  --   --   --  11 10  --     Estimated Creatinine Clearance: 78.4 mL/min (by C-G formula based on SCr of 0.59 mg/dL).   Assessment: 67 y.o. female presented 8/8 with bilateral PE with right heart strain and extensive bilateral lower extremity DVTs.  Patient reports family history of clotting disorder (father).  Pharmacy consulted for heparin dosing.  -HL 0.45 and at the low end of goal , CBC stable -plans noted for possible catheter directed lysis/thrombectomy  Goal of Therapy:  Heparin level 0.3-0.7 units/ml Monitor platelets by anticoagulation protocol: Yes   Plan:  Continue heparin 1500 units/hr Monitor daily anti-Xa levels, CBC, s/sx bleeding  Harland German, PharmD Clinical Pharmacist **Pharmacist phone directory can now be found on [email or web address] ([email or web address]).  Listed under [facility].

## 2023-05-19 NOTE — Procedures (Signed)
Interventional Radiology Procedure Note  Procedure:  1) Left lower extremity venogram 2) Inferior venacavogram 3) IVC filter placement 4) Intravascular ultrasound 5) Left iliofemoral extremity aspiration thrombectomy 6) Balloon angioplasty of left iliofemoral veins 7) Left common to external iliac vein stent placement  Findings: Please refer to procedural dictation for full description.  Infrarenal Option Elite IVC filter placement. Acute/subacute left lower extremity DVT extending throughout femoral and iliac veins.  Aspiration thrombectomy with high yield of acute and subacute appearing thrombus.  Balloon angioplasty of central femoral veins and iliac veins.  Placement of 14 mm x 150 mm left common to external iliac stent.  Complications: None immediate  Estimated Blood Loss: 250 mL  Recommendations: Start therapeutic lovenox, initial dose administered in IR.  Discontinue heparin gtt. Left thigh high compression hose. Left SCDs at all times unless ambulating. Encourage ambulation after 4 hour bedrest. IR will follow.   Marliss Coots, MD Pager: [phone]

## 2023-05-19 NOTE — Progress Notes (Signed)
PROGRESS NOTE    Heather Hudson  OZH:[id number] DOB: 11-21-1955 DOA: 05/14/2023 PCP: [name], NP  Outpatient Specialists:     Brief Narrative:  Patient is a 67 year old female past medical history significant for COVID-19 infection, autoimmune disease/mucocutaneous pemphigoid and remote colovesical fistula.  Patient has been on prednisone on methotrexate.  Patient was admitted with acute pulmonary embolism and bilateral lower extremity DVT.  History of travel to [place] reported.  Patient is currently on heparin.  05/16/2023: Patient seen.  Procalcitonin is less than 0.1.  Will discontinue antibiotics.  Patient was seen alongside patient's husband.  All were updated.  Continue heparin for now.  Likely will continue heparin for the next 24 to 48 hours.  05/17/2023: Patient seen.  Swelling of left lower extremity noted.  Repeat Doppler ultrasound of lower extremity has not shown any changes in clot burden.  Will consult interventional radiology team for possible mechanical thrombectomy and thrombolysis of left lower extremity DVT.  No shortness of breath or chest pain reported.  05/18/2023: Patient seen.  Patient was noted to have gone into brief atrial fibrillation.  No prior history of atrial fibrillation.  Will continue telemetry monitoring for now.  Heart rate is controlled.  Patient is on heparin.  Interventional radiology input is appreciated.  CT venogram of the abdomen/pelvis planned.  Likely thrombectomy versus lysis tomorrow.  05/19/2023: Patient seen alongside patient's husband.  Patient underwent left lower extremity venogram, inferior venacavogram, IVC filter placement, intravascular ultrasound, left iliofemoral extremity aspiration thrombectomy, balloon angioplasty of left iliofemoral veins and left common to external iliac vein stent placement by interventional radiology.  Heparin changed to full dose Lovenox.  IR input is highly appreciated.   Assessment & Plan:   Principal  Problem:   Acute pulmonary embolism (HCC) Active Problems:   Primary hypertension   Mucous membrane pemphigoid   Pulmonary embolism (HCC)   Hyperglycemia   Hypercalcemia   Acute pulmonary embolism (HCC) -Without any hemodynamic instability.  However associated pulmonary infarcts as well as associated borderline questionable hypoxemia.  Patient being treated with supplementary oxygen with plan to check orthostatic in the morning as well as ambulatory pulse oximetry.  Patient has been started on heparin infusion, we will monitor patient's clinical evolution on this.  I advised patient to report any new symptoms including but not limited to headache, new back pain any neurologic symptoms on the lower extremity such as tingling or weakness or bladder or bowel changes or any black liquid stool to the staff right away. Check lower extremity dopplers.   Patient is noted to have leukocytosis, sinus tachycardia, and consolidation on CAT scan.  This is likely due to concern infarction of the lung.  However patient is immunocompromise getting steroid therapy as well as methotrexate.  At this time therefore I will check blood cultures and start empiric treatment for pneumonia 05/15/2023: Continue heparin.  Likely follow-up with hematology team on discharge.  Incentive spirometry. 05/18/2023: Continue heparin. 05/19/2023: Patient underwent left lower extremity thrombectomy.  And on subcutaneous Lovenox.  Heparin drip has been discontinued.   Hypercalcemia Very mild, incidental.  I will check PTH, vitamin D Phos morning. Noted on April 16th as well. 05/15/2023: Resolved.   Hyperglycemia This is likely been precipitated by patient being started on steroid therapy recently.  See below.  I will maintain the patient on insulin sliding scale at this time. 05/15/2023: Add Semglee.  A1c 7.6%.  Consult diabetic educator.  Patient has been on steroids.   Mucous membrane  pemphigoid [name], MD - 04/23/2023 8:45 AM  EDT Formatting of this note is different from the original. Images from the original note were not included. Dermatology Note  05/15/2023: Hematology team is managing.  Extensive DVT left lower extremity: -Consult IR for possible mechanical thrombectomy and thrombolysis. -Will keep patient n.p.o. after midnight. 05/18/2023: For thrombectomy/thrombolysis of the left lower extremity DVT tomorrow by IR.  Brief paroxysmal atrial fibrillation: -Asymptomatic. -Negative troponin. -Rate controlled. -Patient is on heparin drip.   DVT prophylaxis: Heparin. Code Status: Full code. Family Communication: Sister. Disposition Plan: Home eventually.  Remains inpatient.   Consultants:  Follow-up with hematology on discharge. Interventional radiology team for possible mechanical thrombectomy and thrombolysis of left lower extremity DVT.  Procedures:  None.  Antimicrobials:  IV Unasyn discontinued.    Subjective: -No fever or chills. -No shortness of breath. -Asymptomatic brief A-fib reported.  Objective: Vitals:   05/19/23 1705 05/19/23 1710 05/19/23 1715 05/19/23 1730  BP: (!) 126/91 (!) 155/83 (!) 149/80 139/86  Pulse: (!) 59 60 66 67  Resp: 19 17 20 19   Temp:    97.9 F (36.6 C)  TempSrc:    Oral  SpO2: 98% 98% 97% 95%  Weight:      Height:        Intake/Output Summary (Last 24 hours) at 05/19/2023 1909 Last data filed at 05/19/2023 1718 Gross per 24 hour  Intake 2078.49 ml  Output 2100 ml  Net -21.51 ml   Filed Weights   05/14/23 1626  Weight: 81.2 kg    Examination: General exam: Appears calm and comfortable.  Patient is obese.  Cushingoid facies. Respiratory system: Clear to auscultation.  Cardiovascular system: S1 & S2 heard. Gastrointestinal system: Abdomen is obese, soft and nontender.   Central nervous system: Awake and alert.  Patient was all extremities.   Extremities: Swelling of left lower extremity.   Data Reviewed: I have personally reviewed  following labs and imaging studies  CBC: Recent Labs  Lab 05/14/23 1749 05/15/23 0146 05/16/23 0413 05/17/23 0348 05/18/23 0202 05/19/23 0119  WBC 13.1* 9.3 6.4 6.7 6.1 6.6  NEUTROABS 11.7*  --   --   --   --   --   HGB 11.7* 11.7* 9.2* 8.7* 9.4* 10.0*  HCT 33.0* 34.1* 27.7* 26.6* 28.0* 30.0*  MCV 94.3 93.9 100.4* 98.2 97.9 97.4  PLT 188 162 180 223 273 285   Basic Metabolic Panel: Recent Labs  Lab 05/14/23 1749 05/15/23 0146 05/18/23 1129  NA 130* 133* 141  K 4.5 3.3* 3.6  CL 100 103 112*  CO2 21* 18* 18*  GLUCOSE 407* 285* 124*  BUN 33* 25* 9  CREATININE 0.69 0.68 0.59  CALCIUM 10.4* 9.6 9.4  MG  --   --  1.9  PHOS  --  2.9 3.4   GFR: Estimated Creatinine Clearance: 78.4 mL/min (by C-G formula based on SCr of 0.59 mg/dL). Liver Function Tests: Recent Labs  Lab 05/14/23 1749 05/15/23 0146 05/18/23 1129  AST 26 21  --   ALT 42 39  --   ALKPHOS 74 74  --   BILITOT 1.5* 1.3*  --   PROT 6.5 5.9*  --   ALBUMIN 3.7 3.0* 2.6*   No results for input(s): "LIPASE", "AMYLASE" in the last 168 hours. No results for input(s): "AMMONIA" in the last 168 hours. Coagulation Profile: Recent Labs  Lab 05/14/23 1930 05/15/23 0146  INR 1.1 1.1   Cardiac Enzymes: No results for input(s): "CKTOTAL", "CKMB", "CKMBINDEX", "  TROPONINI" in the last 168 hours. BNP (last 3 results) No results for input(s): "PROBNP" in the last 8760 hours. HbA1C: No results for input(s): "HGBA1C" in the last 72 hours.  CBG: Recent Labs  Lab 05/18/23 1619 05/18/23 2132 05/19/23 0634 05/19/23 1137 05/19/23 1751  GLUCAP 124* 173* 109* 125* 146*   Lipid Profile: No results for input(s): "CHOL", "HDL", "LDLCALC", "TRIG", "CHOLHDL", "LDLDIRECT" in the last 72 hours. Thyroid Function Tests: No results for input(s): "TSH", "T4TOTAL", "FREET4", "T3FREE", "THYROIDAB" in the last 72 hours. Anemia Panel: No results for input(s): "VITAMINB12", "FOLATE", "FERRITIN", "TIBC", "IRON", "RETICCTPCT" in  the last 72 hours. Urine analysis: No results found for: "COLORURINE", "APPEARANCEUR", "LABSPEC", "PHURINE", "GLUCOSEU", "HGBUR", "BILIRUBINUR", "KETONESUR", "PROTEINUR", "UROBILINOGEN", "NITRITE", "LEUKOCYTESUR" Sepsis Labs: @LABRCNTIP (procalcitonin:4,lacticidven:4)  ) Recent Results (from the past 240 hour(s))  Resp panel by RT-PCR (RSV, Flu A&B, Covid) Anterior Nasal Swab     Status: None   Collection Time: 05/14/23  5:50 PM   Specimen: Anterior Nasal Swab  Result Value Ref Range Status   SARS Coronavirus 2 by RT PCR NEGATIVE NEGATIVE Final    Comment: (NOTE) SARS-CoV-2 target nucleic acids are NOT DETECTED.  The SARS-CoV-2 RNA is generally detectable in upper respiratory specimens during the acute phase of infection. The lowest concentration of SARS-CoV-2 viral copies this assay can detect is 138 copies/mL. A negative result does not preclude SARS-Cov-2 infection and should not be used as the sole basis for treatment or other patient management decisions. A negative result may occur with  improper specimen collection/handling, submission of specimen other than nasopharyngeal swab, presence of viral mutation(s) within the areas targeted by this assay, and inadequate number of viral copies(<138 copies/mL). A negative result must be combined with clinical observations, patient history, and epidemiological information. The expected result is Negative.  Fact Sheet for Patients:  BloggerCourse.com  Fact Sheet for Healthcare Providers:  SeriousBroker.it  This test is no t yet approved or cleared by the [place] FDA and  has been authorized for detection and/or diagnosis of SARS-CoV-2 by FDA under an Emergency Use Authorization (EUA). This EUA will remain  in effect (meaning this test can be used) for the duration of the COVID-19 declaration under Section 564(b)(1) of the Act, 21 U.S.C.section 360bbb-3(b)(1), unless the  authorization is terminated  or revoked sooner.       Influenza A by PCR NEGATIVE NEGATIVE Final   Influenza B by PCR NEGATIVE NEGATIVE Final    Comment: (NOTE) The Xpert Xpress SARS-CoV-2/FLU/RSV plus assay is intended as an aid in the diagnosis of influenza from Nasopharyngeal swab specimens and should not be used as a sole basis for treatment. Nasal washings and aspirates are unacceptable for Xpert Xpress SARS-CoV-2/FLU/RSV testing.  Fact Sheet for Patients: BloggerCourse.com  Fact Sheet for Healthcare Providers: SeriousBroker.it  This test is not yet approved or cleared by the [place] FDA and has been authorized for detection and/or diagnosis of SARS-CoV-2 by FDA under an Emergency Use Authorization (EUA). This EUA will remain in effect (meaning this test can be used) for the duration of the COVID-19 declaration under Section 564(b)(1) of the Act, 21 U.S.C. section 360bbb-3(b)(1), unless the authorization is terminated or revoked.     Resp Syncytial Virus by PCR NEGATIVE NEGATIVE Final    Comment: (NOTE) Fact Sheet for Patients: BloggerCourse.com  Fact Sheet for Healthcare Providers: SeriousBroker.it  This test is not yet approved or cleared by the [place] FDA and has been authorized for detection and/or diagnosis of SARS-CoV-2 by  FDA under an Emergency Use Authorization (EUA). This EUA will remain in effect (meaning this test can be used) for the duration of the COVID-19 declaration under Section 564(b)(1) of the Act, 21 U.S.C. section 360bbb-3(b)(1), unless the authorization is terminated or revoked.  Performed at [facility], [address] [place], [place] [address]   Culture, blood (Routine X 2) w Reflex to ID Panel     Status: None (Preliminary result)   Collection Time: 05/15/23  1:42 AM   Specimen: BLOOD LEFT ARM  Result  Value Ref Range Status   Specimen Description BLOOD LEFT ARM  Final   Special Requests   Final    BOTTLES DRAWN AEROBIC AND ANAEROBIC Blood Culture adequate volume   Culture   Final    NO GROWTH 4 DAYS Performed at [facility], [address], [place], [place] [address]    Report Status PENDING  Incomplete  Culture, blood (Routine X 2) w Reflex to ID Panel     Status: None (Preliminary result)   Collection Time: 05/15/23  1:51 AM   Specimen: BLOOD LEFT ARM  Result Value Ref Range Status   Specimen Description BLOOD LEFT ARM  Final   Special Requests   Final    BOTTLES DRAWN AEROBIC AND ANAEROBIC Blood Culture adequate volume   Culture   Final    NO GROWTH 4 DAYS Performed at [facility], [address] [place], [place] [address]    Report Status PENDING  Incomplete         Radiology Studies: CT VENOGRAM ABD/PEL  Result Date: 05/18/2023 CLINICAL DATA:  Bilateral pulmonary artery embolism.  DVT suspected. EXAM: CT VENOGRAM ABDOMEN AND PELVIS TECHNIQUE: Venographic phase images of the abdomen and pelvis were obtained following the administration of intravenous contrast. Multiplanar reformats and maximum intensity projections were generated. RADIATION DOSE REDUCTION: This exam was performed according to the departmental dose-optimization program which includes automated exposure control, adjustment of the mA and/or kV according to patient size and/or use of iterative reconstruction technique. CONTRAST:  75mL OMNIPAQUE IOHEXOL 350 MG/ML SOLN COMPARISON:  None CT of the abdomen pelvis dated 02/14/2020. FINDINGS: Lower chest: Small bilateral pleural effusions with partial compressive atelectasis of the lower lobes. Pneumonia is not excluded clinical correlation is recommended. Bilateral lower lobe pulmonary artery emboli are partially visualized. No intra-abdominal free air. Small free fluid within the pelvis. Hepatobiliary: The liver is unremarkable. No biliary ductal  dilatation. The gallbladder is unremarkable. Pancreas: Unremarkable. No pancreatic ductal dilatation or surrounding inflammatory changes. Spleen: Normal in size without focal abnormality. Adrenals/Urinary Tract: The adrenal glands unremarkable. Small right renal lower pole cysts. There is no hydronephrosis on either side. There is symmetric enhancement and excretion of contrast by both kidneys. The visualized ureters and urinary bladder appear unremarkable. Stomach/Bowel: Postsurgical changes of the sigmoid colon with anastomotic staple line. There is moderate stool throughout the colon. There is no bowel obstruction or active inflammation. The appendix is normal. Vascular/Lymphatic: Mild atherosclerotic calcification of the abdominal aorta. The IVC is unremarkable. Thrombus extends from the visualized left common femoral vein to the junction of the left common iliac vein and IVC. The SMV, splenic vein, and main portal vein are patent. No portal venous gas. There is no adenopathy. Reproductive: The uterus is anteverted and grossly unremarkable. No adnexal masses. Other: None Musculoskeletal: No acute or significant osseous findings. IMPRESSION: 1. Left lower extremity DVT extends from the visualized left common femoral vein to the junction of the  left common iliac vein and IVC. 2. Partially visualized bilateral lower lobe pulmonary artery emboli. 3. Small bilateral pleural effusions with partial compressive atelectasis of the lower lobes. 4. No bowel obstruction. Normal appendix. 5.  Aortic Atherosclerosis (ICD10-I70.0). These results will be called to the ordering clinician or representative by the Radiologist Assistant, and communication documented in the PACS or [employer]. Electronically Signed   By: Elgie Collard M.D.   On: 05/18/2023 17:45        Scheduled Meds:  [START ON 05/20/2023] enoxaparin (LOVENOX) injection  80 mg Subcutaneous Q12H   folic acid  1 mg Oral Daily   insulin aspart  0-15  Units Subcutaneous TID WC   insulin aspart  0-5 Units Subcutaneous QHS   insulin glargine-yfgn  10 Units Subcutaneous Daily   losartan  25 mg Oral Daily   methotrexate  10 mg Oral Weekly   predniSONE  20 mg Oral Q breakfast   sodium chloride flush  3 mL Intravenous Q12H   Continuous Infusions:  sodium chloride 75 mL/hr at 05/19/23 1558     LOS: 5 days    Time spent: 35 minutes.    Berton Mount, MD  [facility] Pager #: [phone] 7PM-7AM contact night coverage as above

## 2023-05-19 NOTE — Sedation Documentation (Signed)
Knee-high Ted Hose applied and SCDs applied to BLE at this time.

## 2023-05-19 NOTE — Plan of Care (Signed)
  Problem: Nutritional: Goal: Progress toward achieving an optimal weight will improve Outcome: Progressing   Problem: Education: Goal: Knowledge of General Education information will improve Description: Including pain rating scale, medication(s)/side effects and non-pharmacologic comfort measures Outcome: Progressing   Problem: Clinical Measurements: Goal: Respiratory complications will improve Outcome: Progressing

## 2023-05-20 ENCOUNTER — Other Ambulatory Visit (HOSPITAL_COMMUNITY): Payer: Self-pay

## 2023-05-20 DIAGNOSIS — I2601 Septic pulmonary embolism with acute cor pulmonale: Secondary | ICD-10-CM | POA: Diagnosis not present

## 2023-05-20 DIAGNOSIS — I2609 Other pulmonary embolism with acute cor pulmonale: Secondary | ICD-10-CM | POA: Diagnosis not present

## 2023-05-20 LAB — RENAL FUNCTION PANEL
Albumin: 2.6 g/dL — ABNORMAL LOW (ref 3.5–5.0)
Anion gap: 12 (ref 5–15)
BUN: 7 mg/dL — ABNORMAL LOW (ref 8–23)
CO2: 20 mmol/L — ABNORMAL LOW (ref 22–32)
Calcium: 9.5 mg/dL (ref 8.9–10.3)
Chloride: 107 mmol/L (ref 98–111)
Creatinine, Ser: 0.66 mg/dL (ref 0.44–1.00)
GFR, Estimated: >60 mL/min (ref >60)
Glucose, Bld: 211 mg/dL — ABNORMAL HIGH (ref 70–99)
Phosphorus: 3 mg/dL (ref 2.5–4.6)
Potassium: 3.4 mmol/L — ABNORMAL LOW (ref 3.5–5.1)
Sodium: 139 mmol/L (ref 135–145)

## 2023-05-20 LAB — MAGNESIUM: Magnesium: 1.8 mg/dL (ref 1.7–2.4)

## 2023-05-20 LAB — GLUCOSE, CAPILLARY
Glucose-Capillary: 111 mg/dL — ABNORMAL HIGH (ref 70–99)
Glucose-Capillary: 198 mg/dL — ABNORMAL HIGH (ref 70–99)
Glucose-Capillary: 156 mg/dL — ABNORMAL HIGH (ref 70–99)
Glucose-Capillary: 129 mg/dL — ABNORMAL HIGH (ref 70–99)

## 2023-05-20 MED ORDER — DILTIAZEM HCL-DEXTROSE 125-5 MG/125ML-% IV SOLN (PREMIX)
5.0000 mg/h | INTRAVENOUS | Status: DC
  Administered 2023-05-20: 5 mg/h via INTRAVENOUS
  Filled 2023-05-20: qty 125

## 2023-05-20 NOTE — Consult Note (Signed)
[facility] CONSULT NOTE  Patient Care Team: [name], NP as PCP - General (Internal Medicine)  CHIEF COMPLAINTS/PURPOSE OF CONSULTATION:  Acute bilateral PE and bilateral DVTs  HISTORY OF PRESENTING ILLNESS:  Heather Hudson 67 y.o. female is admitted with hospital with a 1 to 80-month history of progressively worsening fatigue and shortness of breath minimal exertion.  Ultimately she decided to come to the emergency room and was found to have bilateral lower extremity swelling CT PE protocol revealed acute bilateral PEs.  She underwent thrombectomy and thrombolysis by interventional radiology.  She was started on heparin and we are consulted to discuss risk factors for blood clots.  She is already feeling significantly better with regards to her shortness of breath.  I reviewed her records extensively and collaborated the history with the patient.   MEDICAL HISTORY:  Past Medical History:  Diagnosis Date   Adenomatous colon polyp 2015   Allergy 1974   teenager, and 2008   Colovesical fistula 2015   COVID-19 06/2020   Environmental and seasonal allergies    Hx of adenomatous colonic polyps 01/19/2021   Hypertension 11/23   Low dose prescription   Lower abdominal pain 12/23/2019   Migraines    Motion sickness    Persistent cough for 3 weeks or longer 10/10/2021    SURGICAL HISTORY: Past Surgical History:  Procedure Laterality Date   COLON SURGERY  2016   fistula, small section removed   COLONOSCOPY  2015   Plus others   IR INTRAVASCULAR ULTRASOUND NON CORONARY  05/19/2023   IR IVC FILTER PLMT / S&I /IMG GUID/MOD SED  05/19/2023   IR PTA VENOUS EXCEPT DIALYSIS CIRCUIT  05/19/2023   IR THROMBECT VENO MECH MOD SED  05/19/2023   IR TRANSCATH PLC STENT 1ST ART NOT LE CV CAR VERT CAR  05/19/2023   IR US GUIDE VASC ACCESS LEFT  05/19/2023   IR US GUIDE VASC ACCESS RIGHT  05/19/2023   IR VENO/EXT/UNI LEFT  05/19/2023   IR VENOCAVAGRAM IVC  05/19/2023    LAPAROSCOPIC LEFT COLON RESECTION  2015   Colovesical fistula, [place]   Left peroneal tendon repair Left 2023   WISDOM TOOTH EXTRACTION      SOCIAL HISTORY: Social History   Socioeconomic History   Marital status: Married    Spouse name: Not on file   Number of children: Not on file   Years of education: Not on file   Highest education level: Associate degree: occupational, Scientist, product/process development, or vocational program  Occupational History   Not on file  Tobacco Use   Smoking status: Never   Smokeless tobacco: Never   Tobacco comments:    Never smoked  Vaping Use   Vaping status: Never Used  Substance and Sexual Activity   Alcohol use: Yes    Alcohol/week: 5.0 standard drinks of alcohol    Types: 5 Glasses of wine per week   Drug use: Never   Sexual activity: Yes    Birth control/protection: Post-menopausal  Other Topics Concern   Not on file  Social History Narrative   Married.  No children.   Moved from [place] area [place] in [place]    Worked in [employer], Insurance account manager.  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   Enjoys wine tasting.    1-2 caffeinated drinks a day 0-1 alcoholic never smoker no drug use   Sister Mardene Celeste works  [employer] as RN   Social Determinants of Health   Financial Resource Strain: Low Risk  (05/04/2023)   Overall Financial Resource Strain (CARDIA)    Difficulty of Paying Living Expenses: Not hard at all  Food Insecurity: No Food Insecurity (05/16/2023)   Hunger Vital Sign    Worried About Running Out of Food in the Last Year: Never true    Ran Out of Food in the Last Year: Never true  Transportation Needs: No Transportation Needs (05/14/2023)   PRAPARE - Administrator, Civil Service (Medical): No    Lack of Transportation (Non-Medical): No  Physical Activity: Unknown (05/04/2023)   Exercise Vital Sign    Days of Exercise per Week: 0 days     Minutes of Exercise per Session: Not on file  Stress: No Stress Concern Present (05/04/2023)   [employer] of Occupational Health - Occupational Stress Questionnaire    Feeling of Stress : Not at all  Social Connections: Socially Integrated (05/04/2023)   Social Connection and Isolation Panel [NHANES]    Frequency of Communication with Friends and Family: More than three times a week    Frequency of Social Gatherings with Friends and Family: More than three times a week    Attends Religious Services: More than 4 times per year    Active Member of [employer] or Organizations: Yes    Attends Engineer, structural: More than 4 times per year    Marital Status: Married  Catering manager Violence: Not At Risk (05/16/2023)   Humiliation, Afraid, Rape, and Kick questionnaire    Fear of Current or Ex-Partner: No    Emotionally Abused: No    Physically Abused: No    Sexually Abused: No    FAMILY HISTORY: Family History  Problem Relation Age of Onset   Heart disease Mother    Diabetes Mother    Hearing loss Mother    Heart disease Father    Prostate cancer Father    Alcohol abuse Father    Cancer Father    COPD Father    Malignant hyperthermia Sister    Pancreatic cancer Maternal Aunt    Diabetes Brother    Cancer Maternal Grandmother    Miscarriages / Stillbirths Maternal Grandmother    Diabetes Brother    Intellectual disability Brother    Learning disabilities Brother    Vision loss Brother    Miscarriages / Stillbirths Sister    Miscarriages / [place] Sister    Colon cancer Neg Hx    Esophageal cancer Neg Hx    Stomach cancer Neg Hx    Liver disease Neg Hx    Breast cancer Neg Hx     ALLERGIES:  is allergic to aspirin, ibuprofen, other, and propofol.  MEDICATIONS:  Current Facility-Administered Medications  Medication Dose Route Frequency Provider Last Rate Last Admin   0.9 %  sodium chloride infusion   Intravenous Continuous Nolberto Hanlon, MD 75 mL/hr at  05/20/23 0356 New Bag at 05/20/23 0356   acetaminophen (TYLENOL) tablet 650 mg  650 mg Oral Q6H PRN Nolberto Hanlon, MD   650 mg at 05/20/23 0825   Or   acetaminophen (TYLENOL) suppository 650 mg  650 mg Rectal Q6H PRN Nolberto Hanlon, MD       diltiazem (CARDIZEM) 125 mg in dextrose 5% 125 mL (1 mg/mL) infusion  5-15 mg/hr Intravenous Titrated Berton Mount I, MD 5 mL/hr at 05/20/23 1141 5 mg/hr at 05/20/23 1141   enoxaparin (LOVENOX) injection 80  mg  80 mg Subcutaneous Q12H Suttle, Thressa Sheller, MD   80 mg at 05/20/23 0981   folic acid (FOLVITE) tablet 1 mg  1 mg Oral Daily Nolberto Hanlon, MD   1 mg at 05/20/23 0825   insulin aspart (novoLOG) injection 0-15 Units  0-15 Units Subcutaneous TID WC Nolberto Hanlon, MD   3 Units at 05/20/23 1144   insulin aspart (novoLOG) injection 0-5 Units  0-5 Units Subcutaneous QHS Nolberto Hanlon, MD   2 Units at 05/16/23 2213   insulin glargine-yfgn (SEMGLEE) injection 10 Units  10 Units Subcutaneous Daily Berton Mount I, MD   10 Units at 05/19/23 2204   losartan (COZAAR) tablet 25 mg  25 mg Oral Daily Nolberto Hanlon, MD   25 mg at 05/20/23 0825   methotrexate (RHEUMATREX) tablet 10 mg  10 mg Oral Weekly Nolberto Hanlon, MD   10 mg at 05/17/23 0910   Oral care mouth rinse  15 mL Mouth Rinse PRN Nolberto Hanlon, MD       polyethylene glycol (MIRALAX / GLYCOLAX) packet 17 g  17 g Oral Daily PRN Nolberto Hanlon, MD       predniSONE (DELTASONE) tablet 20 mg  20 mg Oral Q breakfast Nolberto Hanlon, MD   20 mg at 05/20/23 0825   sodium chloride flush (NS) 0.9 % injection 3 mL  3 mL Intravenous Q12H Nolberto Hanlon, MD   3 mL at 05/20/23 1914    REVIEW OF SYSTEMS:   Constitutional: Denies fevers, chills or abnormal night sweats All other systems were reviewed with the patient and are negative.  PHYSICAL EXAMINATION: ECOG PERFORMANCE STATUS: 1 - Symptomatic but completely ambulatory  Vitals:   05/20/23 1136 05/20/23 1227  BP: (!) 136/90 132/77  Pulse: (!) 36 98  Resp: 20 20  Temp: 97.7 F (36.5  C) 98.2 F (36.8 C)  SpO2: 100% 97%   Filed Weights   05/14/23 1626  Weight: 179 lb 0.2 oz (81.2 kg)    GENERAL:alert, no distress and comfortable  LABORATORY DATA:  I have reviewed the data as listed Lab Results  Component Value Date   WBC 7.1 05/20/2023   HGB 9.0 (L) 05/20/2023   HCT 26.7 (L) 05/20/2023   MCV 97.1 05/20/2023   PLT 297 05/20/2023   Lab Results  Component Value Date   NA 139 05/20/2023   K 3.4 (L) 05/20/2023   CL 107 05/20/2023   CO2 20 (L) 05/20/2023    RADIOGRAPHIC STUDIES: I have personally reviewed the radiological reports and agreed with the findings in the report.  ASSESSMENT AND PLAN:  Acute pulmonary embolism and bilateral lower extremity DVT: 05/14/2023: Acute bilateral PE involving both main pulmonary arteries, numerous lobar segmental subsegmental arteries with right heart strain right middle lobe wedge-shaped.  consolidation: Infarcts  No clear-cut predisposing factors. (Prior history of COVID-19, autoimmune/mucocutaneous pemphigoid, on prednisone and methotrexate) Status post thrombectomy and thrombolysis I discussed with the patient risk factors for blood clots.  Inherited risk factors include: 1. Factor V Leiden mutation 2. Prothrombin gene [id number] 3. Protein S deficiency  4. Protein C deficiency  5. Antithrombin deficiency  Acquired risk factors include: 1. Antiphospholipid antibody syndrome 2. Obesity 3.  Travel/sedentary behavior     Workup recommended: Bloodwork to evaluate for the 5 inherited factors mentioned above along with antiphospholipid antibodies. Follow-up in my clinic in one to 2 weeks to discuss the results of the tests   All questions were answered. The patient knows to call the clinic with  any problems, questions or concerns.    Tamsen Meek, MD

## 2023-05-20 NOTE — Progress Notes (Signed)
PROGRESS NOTE    Heather Hudson  ZOX:[id number] DOB: [birth year] DOA: 05/14/2023 PCP: [name], NP  Outpatient Specialists:     Brief Narrative:  Patient is a 67 year old female past medical history significant for COVID-19 infection, autoimmune disease/mucocutaneous pemphigoid and remote colovesical fistula.  Patient has been on prednisone on methotrexate.  Patient was admitted with acute pulmonary embolism and bilateral lower extremity DVT.  History of travel to [place] reported.  Patient is currently on heparin.  05/16/2023: Patient seen.  Procalcitonin is less than 0.1.  Will discontinue antibiotics.  Patient was seen alongside patient's husband.  All were updated.  Continue heparin for now.  Likely will continue heparin for the next 24 to 48 hours.  05/17/2023: Patient seen.  Swelling of left lower extremity noted.  Repeat Doppler ultrasound of lower extremity has not shown any changes in clot burden.  Will consult interventional radiology team for possible mechanical thrombectomy and thrombolysis of left lower extremity DVT.  No shortness of breath or chest pain reported.  05/18/2023: Patient seen.  Patient was noted to have gone into brief atrial fibrillation.  No prior history of atrial fibrillation.  Will continue telemetry monitoring for now.  Heart rate is controlled.  Patient is on heparin.  Interventional radiology input is appreciated.  CT venogram of the abdomen/pelvis planned.  Likely thrombectomy versus lysis tomorrow.  05/19/2023: Patient seen alongside patient's husband.  Patient underwent left lower extremity venogram, inferior venacavogram, IVC filter placement, intravascular ultrasound, left iliofemoral extremity aspiration thrombectomy, balloon angioplasty of left iliofemoral veins and left common to external iliac vein stent placement by interventional radiology.  Heparin changed to full dose Lovenox.  IR input is highly appreciated.  05/20/2023: Patient seen alongside  patient's husband.  Swelling of the left lower extremity is improving.  Hematology team consulted to assist with patient's management.  No chest pain or shortness of breath.     Assessment & Plan:   Principal Problem:   Acute pulmonary embolism (HCC) Active Problems:   Primary hypertension   Mucous membrane pemphigoid   Pulmonary embolism (HCC)   Hyperglycemia   Hypercalcemia   Acute pulmonary embolism (HCC) -Without any hemodynamic instability.  However associated pulmonary infarcts as well as associated borderline questionable hypoxemia.  Patient being treated with supplementary oxygen with plan to check orthostatic in the morning as well as ambulatory pulse oximetry.  Patient has been started on heparin infusion, we will monitor patient's clinical evolution on this.  I advised patient to report any new symptoms including but not limited to headache, new back pain any neurologic symptoms on the lower extremity such as tingling or weakness or bladder or bowel changes or any black liquid stool to the staff right away. Check lower extremity dopplers.   Patient is noted to have leukocytosis, sinus tachycardia, and consolidation on CAT scan.  This is likely due to concern infarction of the lung.  However patient is immunocompromise getting steroid therapy as well as methotrexate.  At this time therefore I will check blood cultures and start empiric treatment for pneumonia 05/15/2023: Continue heparin.  Likely follow-up with hematology team on discharge.  Incentive spirometry. 05/18/2023: Continue heparin. 05/19/2023: Patient underwent left lower extremity thrombectomy.  And on subcutaneous Lovenox.  Heparin drip has been discontinued.   Hypercalcemia Very mild, incidental.  I will check PTH, vitamin D Phos morning. Noted on April 16th as well. 05/15/2023: Resolved.   Hyperglycemia This is likely been precipitated by patient being started on steroid therapy recently.  See below.  I will maintain  the patient on insulin sliding scale at this time. 05/15/2023: Add Semglee.  A1c 7.6%.  Consult diabetic educator.  Patient has been on steroids. 05/20/2023: Blood sugar control is improving.   Mucous membrane pemphigoid [name], MD - 04/23/2023 8:45 AM EDT Formatting of this note is different from the original. Images from the original note were not included. Dermatology Note  05/15/2023: Hematology team is managing.  Extensive DVT left lower extremity: -Consult IR for possible mechanical thrombectomy and thrombolysis. -Will keep patient n.p.o. after midnight. 05/18/2023: For thrombectomy/thrombolysis of the left lower extremity DVT tomorrow by IR. 05/20/2023: Improvement in left lower extremity swelling.  Continue subcutaneous Lovenox.  Brief paroxysmal atrial fibrillation: -Asymptomatic. -Negative troponin. -Rate controlled. -Patient is on heparin drip. 05/20/2023: A-fib RVR in the 160s noted earlier today.  Patient responded to short course of Cardizem drip.  Patient is back to sinus rhythm.  Follow-up with cardiology team on discharge.  Patient manage heart monitor on discharge.   DVT prophylaxis: Heparin. Code Status: Full code. Family Communication: Sister. Disposition Plan: Home eventually.  Remains inpatient.   Consultants:  Follow-up with hematology on discharge. Interventional radiology team for possible mechanical thrombectomy and thrombolysis of left lower extremity DVT.  Procedures:  None.  Antimicrobials:  IV Unasyn discontinued.    Subjective: -No fever or chills. -No shortness of breath. -Asymptomatic brief A-fib noted.  Objective: Vitals:   05/20/23 0809 05/20/23 1136 05/20/23 1227 05/20/23 1624  BP: (!) 147/78 (!) 136/90 132/77 127/74  Pulse: 92 (!) 36 98 69  Resp: 18 20 20 18   Temp: 98 F (36.7 C) 97.7 F (36.5 C) 98.2 F (36.8 C) 99.1 F (37.3 C)  TempSrc: Oral Oral Oral Oral  SpO2: 97% 100% 97% 98%  Weight:      Height:         Intake/Output Summary (Last 24 hours) at 05/20/2023 1732 Last data filed at 05/20/2023 1610 Gross per 24 hour  Intake 350 ml  Output 550 ml  Net -200 ml   Filed Weights   05/14/23 1626  Weight: 81.2 kg    Examination: General exam: Appears calm and comfortable.  Patient is obese.  Cushingoid facies. Respiratory system: Clear to auscultation.  Cardiovascular system: S1 & S2 heard. Gastrointestinal system: Abdomen is obese, soft and nontender.   Central nervous system: Awake and alert.  Patient was all extremities.   Extremities: Swelling of left lower extremity is improving.   Data Reviewed: I have personally reviewed following labs and imaging studies  CBC: Recent Labs  Lab 05/14/23 1749 05/15/23 0146 05/16/23 0413 05/17/23 0348 05/18/23 0202 05/19/23 0119 05/20/23 0101  WBC 13.1*   < > 6.4 6.7 6.1 6.6 7.1  NEUTROABS 11.7*  --   --   --   --   --   --   HGB 11.7*   < > 9.2* 8.7* 9.4* 10.0* 9.0*  HCT 33.0*   < > 27.7* 26.6* 28.0* 30.0* 26.7*  MCV 94.3   < > 100.4* 98.2 97.9 97.4 97.1  PLT 188   < > 180 223 273 285 297   < > = values in this interval not displayed.   Basic Metabolic Panel: Recent Labs  Lab 05/14/23 1749 05/15/23 0146 05/18/23 1129 05/20/23 1245  NA 130* 133* 141 139  K 4.5 3.3* 3.6 3.4*  CL 100 103 112* 107  CO2 21* 18* 18* 20*  GLUCOSE 407* 285* 124* 211*  BUN 33* 25* 9  7*  CREATININE 0.69 0.68 0.59 0.66  CALCIUM 10.4* 9.6 9.4 9.5  MG  --   --  1.9 1.8  PHOS  --  2.9 3.4 3.0   GFR: Estimated Creatinine Clearance: 78.4 mL/min (by C-G formula based on SCr of 0.66 mg/dL). Liver Function Tests: Recent Labs  Lab 05/14/23 1749 05/15/23 0146 05/18/23 1129 05/20/23 1245  AST 26 21  --   --   ALT 42 39  --   --   ALKPHOS 74 74  --   --   BILITOT 1.5* 1.3*  --   --   PROT 6.5 5.9*  --   --   ALBUMIN 3.7 3.0* 2.6* 2.6*   No results for input(s): "LIPASE", "AMYLASE" in the last 168 hours. No results for input(s): "AMMONIA" in the  last 168 hours. Coagulation Profile: Recent Labs  Lab 05/14/23 1930 05/15/23 0146  INR 1.1 1.1   Cardiac Enzymes: No results for input(s): "CKTOTAL", "CKMB", "CKMBINDEX", "TROPONINI" in the last 168 hours. BNP (last 3 results) No results for input(s): "PROBNP" in the last 8760 hours. HbA1C: No results for input(s): "HGBA1C" in the last 72 hours.  CBG: Recent Labs  Lab 05/19/23 1751 05/19/23 2049 05/20/23 0527 05/20/23 1135 05/20/23 1626  GLUCAP 146* 136* 111* 198* 156*   Lipid Profile: No results for input(s): "CHOL", "HDL", "LDLCALC", "TRIG", "CHOLHDL", "LDLDIRECT" in the last 72 hours. Thyroid Function Tests: No results for input(s): "TSH", "T4TOTAL", "FREET4", "T3FREE", "THYROIDAB" in the last 72 hours. Anemia Panel: No results for input(s): "VITAMINB12", "FOLATE", "FERRITIN", "TIBC", "IRON", "RETICCTPCT" in the last 72 hours. Urine analysis: No results found for: "COLORURINE", "APPEARANCEUR", "LABSPEC", "PHURINE", "GLUCOSEU", "HGBUR", "BILIRUBINUR", "KETONESUR", "PROTEINUR", "UROBILINOGEN", "NITRITE", "LEUKOCYTESUR" Sepsis Labs: @LABRCNTIP (procalcitonin:4,lacticidven:4)  ) Recent Results (from the past 240 hour(s))  Resp panel by RT-PCR (RSV, Flu A&B, Covid) Anterior Nasal Swab     Status: None   Collection Time: 05/14/23  5:50 PM   Specimen: Anterior Nasal Swab  Result Value Ref Range Status   SARS Coronavirus 2 by RT PCR NEGATIVE NEGATIVE Final    Comment: (NOTE) SARS-CoV-2 target nucleic acids are NOT DETECTED.  The SARS-CoV-2 RNA is generally detectable in upper respiratory specimens during the acute phase of infection. The lowest concentration of SARS-CoV-2 viral copies this assay can detect is 138 copies/mL. A negative result does not preclude SARS-Cov-2 infection and should not be used as the sole basis for treatment or other patient management decisions. A negative result may occur with  improper specimen collection/handling, submission of specimen  other than nasopharyngeal swab, presence of viral mutation(s) within the areas targeted by this assay, and inadequate number of viral copies(<138 copies/mL). A negative result must be combined with clinical observations, patient history, and epidemiological information. The expected result is Negative.  Fact Sheet for Patients:  BloggerCourse.com  Fact Sheet for Healthcare Providers:  SeriousBroker.it  This test is no t yet approved or cleared by the [place] FDA and  has been authorized for detection and/or diagnosis of SARS-CoV-2 by FDA under an Emergency Use Authorization (EUA). This EUA will remain  in effect (meaning this test can be used) for the duration of the COVID-19 declaration under Section 564(b)(1) of the Act, 21 U.S.C.section 360bbb-3(b)(1), unless the authorization is terminated  or revoked sooner.       Influenza A by PCR NEGATIVE NEGATIVE Final   Influenza B by PCR NEGATIVE NEGATIVE Final    Comment: (NOTE) The Xpert Xpress SARS-CoV-2/FLU/RSV plus assay is intended as an  aid in the diagnosis of influenza from Nasopharyngeal swab specimens and should not be used as a sole basis for treatment. Nasal washings and aspirates are unacceptable for Xpert Xpress SARS-CoV-2/FLU/RSV testing.  Fact Sheet for Patients: BloggerCourse.com  Fact Sheet for Healthcare Providers: SeriousBroker.it  This test is not yet approved or cleared by the [place] FDA and has been authorized for detection and/or diagnosis of SARS-CoV-2 by FDA under an Emergency Use Authorization (EUA). This EUA will remain in effect (meaning this test can be used) for the duration of the COVID-19 declaration under Section 564(b)(1) of the Act, 21 U.S.C. section 360bbb-3(b)(1), unless the authorization is terminated or revoked.     Resp Syncytial Virus by PCR NEGATIVE NEGATIVE Final     Comment: (NOTE) Fact Sheet for Patients: BloggerCourse.com  Fact Sheet for Healthcare Providers: SeriousBroker.it  This test is not yet approved or cleared by the [place] FDA and has been authorized for detection and/or diagnosis of SARS-CoV-2 by FDA under an Emergency Use Authorization (EUA). This EUA will remain in effect (meaning this test can be used) for the duration of the COVID-19 declaration under Section 564(b)(1) of the Act, 21 U.S.C. section 360bbb-3(b)(1), unless the authorization is terminated or revoked.  Performed at [facility], [address] [place], [place] [address]   Culture, blood (Routine X 2) w Reflex to ID Panel     Status: None   Collection Time: 05/15/23  1:42 AM   Specimen: BLOOD LEFT ARM  Result Value Ref Range Status   Specimen Description BLOOD LEFT ARM  Final   Special Requests   Final    BOTTLES DRAWN AEROBIC AND ANAEROBIC Blood Culture adequate volume   Culture   Final    NO GROWTH 5 DAYS Performed at [facility], [address] [place], [place] [address]    Report Status 05/20/2023 FINAL  Final  Culture, blood (Routine X 2) w Reflex to ID Panel     Status: None   Collection Time: 05/15/23  1:51 AM   Specimen: BLOOD LEFT ARM  Result Value Ref Range Status   Specimen Description BLOOD LEFT ARM  Final   Special Requests   Final    BOTTLES DRAWN AEROBIC AND ANAEROBIC Blood Culture adequate volume   Culture   Final    NO GROWTH 5 DAYS Performed at [facility], [address] [place], [place] [address]    Report Status 05/20/2023 FINAL  Final         Radiology Studies: IR [facility] MOD SED  Result Date: 05/20/2023 INDICATION: 67 year old female presenting with acute pulmonary embolism and left lower extremity swelling with acute appearing thrombus extending from the left common iliac vein in the left calf veins. Concern for May-Thurner  morphology on CT. EXAM: 1. Ultrasound-guided vascular access of the right common femoral vein. 2. Inferior vena cavogram. 3. Placement of infrarenal IVC filter. 4. Left lower extremity venogram. 5. Intravascular ultrasound. 6. Aspiration thrombectomy of left iliac and femoral veins. 7. Balloon angioplasty of left iliac and femoral veins. 8. Ultrasound-guided vascular access of the left common femoral vein. 9. Left iliac venous stent placement. COMPARISON:  None Available. MEDICATIONS: Benadryl 50 mg, intravenous ANESTHESIA/SEDATION: Versed 8 mg IV; Fentanyl 300 mcg IV Moderate Sedation Time:  118 minutes The patient was continuously monitored during the procedure by the interventional radiology nurse under my direct supervision. FLUOROSCOPY TIME:  One hundred forty-one mGy COMPLICATIONS: None immediate. TECHNIQUE: Informed written consent was  obtained from the patient after a thorough discussion of the procedural risks, benefits and alternatives. All questions were addressed. Maximal Sterile Barrier Technique was utilized including caps, mask, sterile gowns, sterile gloves, sterile drape, hand hygiene and skin antiseptic. A timeout was performed prior to the initiation of the procedure. Preprocedure ultrasound evaluation of the right common femoral vein demonstrated patency and compressibility. Subdermal Local anesthesia was provided at the planned needle entry site. A small skin nick was made. Under direct ultrasound visualization, the right common femoral vein was accessed with a 21 gauge micropuncture needle. A micropuncture sheath was placed and a Bentson wire was directed to the inferior vena cava. An 8 French sheath was then placed over the wire and inferior vena cavagram was performed which demonstrated normal caliber, widely patent inferior vena cava without vascular anomaly. Over the wire, an Argon option elite filter was introduced and deployed in infrarenal location under fluoroscopic visualization. Post  deployment inferior vena cavogram demonstrated proper positioning. Two Perclose devices were then pre deployed at the 10 o'clock and 2 o'clock positions in the right common femoral vein after removal of the 8 [place] sheath. After serial dilation was performed, a 33 cm 16 French dry seal sheath was inserted and positioned near the iliac vein confluence. The left common iliac vein was then cannulated using an Omni flush followed by a C2 catheter and Glidewire. The catheter was directed to the mid to peripheral left femoral vein. An exchange Rosen wire was then placed in the catheter was removed. Intravascular ultrasound was then performed with a Phillips 0.035 inch IVUS catheter. There was extensive, acute to subacute appearing thrombus throughout the iliac and femoral veins. Of note, there was severe focal compression of the left common iliac vein at the site of overlying right common iliac artery. Next, under intermittent fluoroscopic visualization, the 16 French penumbra flash aspiration catheter was inserted over the wire and aspiration was performed throughout the left common iliac, external iliac, common femoral, and central to mid femoral vein. There was copious acute and subacute appearing thrombus in the aspiration canister. Approximately 4 passes were made through the segments. The aspiration catheter was removed and exchanged for a 4 French, 90 cm Navicross catheter which was positioned in the peripheral femoral vein. Left lower extremity venogram was performed which demonstrated persistent filling defects in the femoral vein without significant opacification of the iliac veins. Next, balloon maceration was performed throughout the thrombosed segment with a 10 mm by 80 mm Athletis balloon in the iliac veins and 8 cm x 40 mm mustang balloon throughout the central aspect of the femoral vein. After angioplasty, repeat venogram from the central femoral vein demonstrated significantly improved patency inflow  with persistent mild luminal irregularity and stenosis in the left common iliac vein. Ultrasound evaluation of the left common femoral vein demonstrated a patent central channel with persistent peripheral, subacute appearing thrombus. Subdermal Local anesthesia was administered at the planned needle entry site. A small skin nick was made. Under direct ultrasound visualization, the left common femoral vein was accessed with a 21 gauge micropuncture needle. A micropuncture sheath was inserted in a Bentson wire was directed to the inferior vena cava. The micropuncture sheath was exchanged for a 9 French, 10 cm vascular sheath. Intravascular ultrasound was then performed from the left common femoral access site which demonstrated mild irregularities of the lumen of the external iliac with persistent severe stenosis of the left common iliac vein at the site of the overlying right common iliac  artery. Intravascular ultrasound was used for measurement purposes of the vein. Left pelvic venogram was then performed confirming the findings on intravascular ultrasound. Under direct fluoroscopic visualization, a 14 mm x 150 mm operate stent was then deployed. Post deployment balloon venoplasty was then performed with a 14 mm x 4 cm atlas balloon throughout the length of the stent. Completion bilateral pelvic venogram demonstrated patency of the stent with antegrade flow into the inferior vena cava. There were a few non flow limiting luminal filling defects throughout the stented segment. Completion inferior vena cavogram was then performed which demonstrated small focal filling defect within the apex of the implanted filter compatible with small volume of migrated thrombus. The left common femoral vein sheath was removed and hemostasis was achieved with manual compression. Sterile bandage was applied. The right common femoral sheath was then removed and the 2 Perclose devices were tied which achieve immediate hemostasis. A  sterile bandage was applied. The patient tolerated the procedure well was transferred back to the floor in good condition. IMPRESSION: 1. Acute to subacute left lower extremity deep vein thrombosis extending from the left common iliac veins the calf veins. 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 2. Technically successful placement of inferior vena cava filter. Interventional radiology will follow the patient as an outpatient with plans to evaluate for filter retrieval in approximately 3 months. 3. Technically successful computer-assisted aspiration thrombectomy and balloon venoplasty of the left iliac and femoral veins. 4. Technically successful placement of uncovered, self expanding stent (14 x 150 mm Abre) spanning the left common through left external iliac veins. PLAN: Discontinue heparin infusion in begin therapeutic Lovenox injections. The first dose was administered in Interventional Radiology. Recommend left lower extremity compression hose and SCDs and encourage ambulation. Recommend Lovenox for at least 1 month prior to switching to oral anticoagulation. Interventional radiology will follow all in patient and arrange 1 month follow-up as an outpatient with lower extremity venous duplex and CT venogram abdomen pelvis. Marliss Coots, MD [facility] Radiology Electronically Signed   By: Marliss Coots M.D.   On: 05/20/2023 09:54   IR INTRAVASCULAR ULTRASOUND NON CORONARY  Result Date: 05/20/2023 INDICATION: 67 year old female presenting with acute pulmonary embolism and left lower extremity swelling with acute appearing thrombus extending from the left common iliac vein in the left calf veins. Concern for May-Thurner morphology on CT. EXAM: 1. Ultrasound-guided vascular access of the right common femoral vein. 2. Inferior vena cavogram. 3. Placement of  infrarenal IVC filter. 4. Left lower extremity venogram. 5. Intravascular ultrasound. 6. Aspiration thrombectomy of left iliac and femoral veins. 7. Balloon angioplasty of left iliac and femoral veins. 8. Ultrasound-guided vascular access of the left common femoral vein. 9. Left iliac venous stent placement. COMPARISON:  None Available. MEDICATIONS: Benadryl 50 mg, intravenous ANESTHESIA/SEDATION: Versed 8 mg IV; Fentanyl 300 mcg IV Moderate Sedation Time:  118 minutes The patient was continuously monitored during the procedure by the interventional radiology nurse under my direct supervision. FLUOROSCOPY TIME:  One hundred forty-one mGy COMPLICATIONS: None immediate. TECHNIQUE: Informed written consent was obtained from the patient after a thorough discussion of the procedural risks, benefits and alternatives. All questions were addressed. Maximal Sterile Barrier Technique was utilized including caps, mask, sterile gowns, sterile gloves, sterile drape, hand hygiene and skin antiseptic. A timeout was performed prior to the initiation of the procedure. Preprocedure ultrasound evaluation of the right common  femoral vein demonstrated patency and compressibility. Subdermal Local anesthesia was provided at the planned needle entry site. A small skin nick was made. Under direct ultrasound visualization, the right common femoral vein was accessed with a 21 gauge micropuncture needle. A micropuncture sheath was placed and a Bentson wire was directed to the inferior vena cava. An 8 French sheath was then placed over the wire and inferior vena cavagram was performed which demonstrated normal caliber, widely patent inferior vena cava without vascular anomaly. Over the wire, an Argon option elite filter was introduced and deployed in infrarenal location under fluoroscopic visualization. Post deployment inferior vena cavogram demonstrated proper positioning. Two Perclose devices were then pre deployed at the 10 o'clock and 2  o'clock positions in the right common femoral vein after removal of the 8 [place] sheath. After serial dilation was performed, a 33 cm 16 French dry seal sheath was inserted and positioned near the iliac vein confluence. The left common iliac vein was then cannulated using an Omni flush followed by a C2 catheter and Glidewire. The catheter was directed to the mid to peripheral left femoral vein. An exchange Rosen wire was then placed in the catheter was removed. Intravascular ultrasound was then performed with a Phillips 0.035 inch IVUS catheter. There was extensive, acute to subacute appearing thrombus throughout the iliac and femoral veins. Of note, there was severe focal compression of the left common iliac vein at the site of overlying right common iliac artery. Next, under intermittent fluoroscopic visualization, the 16 French penumbra flash aspiration catheter was inserted over the wire and aspiration was performed throughout the left common iliac, external iliac, common femoral, and central to mid femoral vein. There was copious acute and subacute appearing thrombus in the aspiration canister. Approximately 4 passes were made through the segments. The aspiration catheter was removed and exchanged for a 4 French, 90 cm Navicross catheter which was positioned in the peripheral femoral vein. Left lower extremity venogram was performed which demonstrated persistent filling defects in the femoral vein without significant opacification of the iliac veins. Next, balloon maceration was performed throughout the thrombosed segment with a 10 mm by 80 mm Athletis balloon in the iliac veins and 8 cm x 40 mm mustang balloon throughout the central aspect of the femoral vein. After angioplasty, repeat venogram from the central femoral vein demonstrated significantly improved patency inflow with persistent mild luminal irregularity and stenosis in the left common iliac vein. Ultrasound evaluation of the left common femoral  vein demonstrated a patent central channel with persistent peripheral, subacute appearing thrombus. Subdermal Local anesthesia was administered at the planned needle entry site. A small skin nick was made. Under direct ultrasound visualization, the left common femoral vein was accessed with a 21 gauge micropuncture needle. A micropuncture sheath was inserted in a Bentson wire was directed to the inferior vena cava. The micropuncture sheath was exchanged for a 9 French, 10 cm vascular sheath. 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 Intravascular ultrasound was used for measurement purposes of the vein. Left pelvic venogram was then performed confirming the findings on intravascular ultrasound. Under direct fluoroscopic visualization, a 14 mm x 150 mm operate stent was then deployed. Post deployment balloon venoplasty was then performed with a 14 mm x 4 cm atlas balloon throughout the length  of the stent. Completion bilateral pelvic venogram demonstrated patency of the stent with antegrade flow into the inferior vena cava. There were a few non flow limiting luminal filling defects throughout the stented segment. Completion inferior vena cavogram was then performed which demonstrated small focal filling defect within the apex of the implanted filter compatible with small volume of migrated thrombus. The left common femoral vein sheath was removed and hemostasis was achieved with manual compression. Sterile bandage was applied. The right common femoral sheath was then removed and the 2 Perclose devices were tied which achieve immediate hemostasis. A sterile bandage was applied. The patient tolerated the procedure well was transferred back to the floor in good condition. IMPRESSION: 1.  Acute to subacute left lower extremity deep vein thrombosis extending from the left common iliac veins the calf veins. 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 2. Technically successful placement of inferior vena cava filter. Interventional radiology will follow the patient as an outpatient with plans to evaluate for filter retrieval in approximately 3 months. 3. Technically successful computer-assisted aspiration thrombectomy and balloon venoplasty of the left iliac and femoral veins. 4. Technically successful placement of uncovered, self expanding stent (14 x 150 mm Abre) spanning the left common through left external iliac veins. PLAN: Discontinue heparin infusion in begin therapeutic Lovenox injections. The first dose was administered in Interventional Radiology. Recommend left lower extremity compression hose and SCDs and encourage ambulation. Recommend Lovenox for at least 1 month prior to switching to oral anticoagulation. Interventional radiology will follow all in patient and arrange 1 month follow-up as an outpatient with lower extremity venous duplex and CT venogram abdomen pelvis. Marliss Coots, MD [facility] Radiology Electronically Signed   By: Marliss Coots M.D.   On: 05/20/2023 09:54   IR US Guide Vasc Access Right  Result Date: 05/20/2023 INDICATION: 67 year old female presenting with acute pulmonary embolism and left lower extremity swelling with acute appearing thrombus extending from the left common iliac vein in the left calf veins. Concern for May-Thurner morphology on CT. EXAM: 1. Ultrasound-guided vascular access of the right common femoral vein. 2. Inferior vena cavogram. 3. Placement of infrarenal IVC filter. 4. Left lower extremity venogram. 5. Intravascular ultrasound. 6. Aspiration thrombectomy of left iliac and femoral veins. 7.  Balloon angioplasty of left iliac and femoral veins. 8. Ultrasound-guided vascular access of the left common femoral vein. 9. Left iliac venous stent placement. COMPARISON:  None Available. MEDICATIONS: Benadryl 50 mg, intravenous ANESTHESIA/SEDATION: Versed 8 mg IV; Fentanyl 300 mcg IV Moderate Sedation Time:  118 minutes The patient was continuously monitored during the procedure by the interventional radiology nurse under my direct supervision. FLUOROSCOPY TIME:  One hundred forty-one mGy COMPLICATIONS: None immediate. TECHNIQUE: Informed written consent was obtained from the patient after a thorough discussion of the procedural risks, benefits and alternatives. All questions were addressed. Maximal Sterile Barrier Technique was utilized including caps, mask, sterile gowns, sterile gloves, sterile drape, hand hygiene and skin antiseptic. A timeout was performed prior to the initiation of the procedure. Preprocedure ultrasound evaluation of the right common femoral vein demonstrated patency and compressibility. Subdermal Local anesthesia was provided at the planned needle entry site. A small skin nick was made. Under direct ultrasound visualization, the right common femoral vein was accessed with a 21 gauge micropuncture needle. A micropuncture sheath was placed and a Bentson wire was directed to the inferior vena cava.  An 8 French sheath was then placed over the wire and inferior vena cavagram was performed which demonstrated normal caliber, widely patent inferior vena cava without vascular anomaly. Over the wire, an Argon option elite filter was introduced and deployed in infrarenal location under fluoroscopic visualization. Post deployment inferior vena cavogram demonstrated proper positioning. Two Perclose devices were then pre deployed at the 10 o'clock and 2 o'clock positions in the right common femoral vein after removal of the 8 [place] sheath. After serial dilation was performed, a 33 cm 16 French dry seal  sheath was inserted and positioned near the iliac vein confluence. The left common iliac vein was then cannulated using an Omni flush followed by a C2 catheter and Glidewire. The catheter was directed to the mid to peripheral left femoral vein. An exchange Rosen wire was then placed in the catheter was removed. Intravascular ultrasound was then performed with a Phillips 0.035 inch IVUS catheter. There was extensive, acute to subacute appearing thrombus throughout the iliac and femoral veins. Of note, there was severe focal compression of the left common iliac vein at the site of overlying right common iliac artery. Next, under intermittent fluoroscopic visualization, the 16 French penumbra flash aspiration catheter was inserted over the wire and aspiration was performed throughout the left common iliac, external iliac, common femoral, and central to mid femoral vein. There was copious acute and subacute appearing thrombus in the aspiration canister. Approximately 4 passes were made through the segments. The aspiration catheter was removed and exchanged for a 4 French, 90 cm Navicross catheter which was positioned in the peripheral femoral vein. Left lower extremity venogram was performed which demonstrated persistent filling defects in the femoral vein without significant opacification of the iliac veins. Next, balloon maceration was performed throughout the thrombosed segment with a 10 mm by 80 mm Athletis balloon in the iliac veins and 8 cm x 40 mm mustang balloon throughout the central aspect of the femoral vein. After angioplasty, repeat venogram from the central femoral vein demonstrated significantly improved patency inflow with persistent mild luminal irregularity and stenosis in the left common iliac vein. Ultrasound evaluation of the left common femoral vein demonstrated a patent central channel with persistent peripheral, subacute appearing thrombus. Subdermal Local anesthesia was administered at the  planned needle entry site. A small skin nick was made. Under direct ultrasound visualization, the left common femoral vein was accessed with a 21 gauge micropuncture needle. A micropuncture sheath was inserted in a Bentson wire was directed to the inferior vena cava. The micropuncture sheath was exchanged for a 9 French, 10 cm vascular sheath. 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 Intravascular ultrasound was used for measurement purposes of the vein. Left pelvic venogram was then performed confirming the findings on intravascular ultrasound. Under direct fluoroscopic visualization, a 14 mm x 150 mm operate stent was then deployed. Post deployment balloon venoplasty was then performed with a 14 mm x 4 cm atlas balloon throughout the length of the stent. Completion bilateral pelvic venogram demonstrated patency of the stent with antegrade flow into the inferior vena cava. There were a few non flow limiting luminal filling defects throughout the stented segment. Completion inferior vena cavogram was then performed which demonstrated small focal filling defect within the apex of the implanted filter compatible with  small volume of migrated thrombus. The left common femoral vein sheath was removed and hemostasis was achieved with manual compression. Sterile bandage was applied. The right common femoral sheath was then removed and the 2 Perclose devices were tied which achieve immediate hemostasis. A sterile bandage was applied. The patient tolerated the procedure well was transferred back to the floor in good condition. IMPRESSION: 1. Acute to subacute left lower extremity deep vein thrombosis extending from the left common iliac veins the calf veins. 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 2. Technically successful placement of inferior vena cava filter. Interventional radiology will follow the patient as an outpatient with plans to evaluate for filter retrieval in approximately 3 months. 3. Technically successful computer-assisted aspiration thrombectomy and balloon venoplasty of the left iliac and femoral veins. 4. Technically successful placement of uncovered, self expanding stent (14 x 150 mm Abre) spanning the left common through left external iliac veins. PLAN: Discontinue heparin infusion in begin therapeutic Lovenox injections. The first dose was administered in Interventional Radiology. Recommend left lower extremity compression hose and SCDs and encourage ambulation. Recommend Lovenox for at least 1 month prior to switching to oral anticoagulation. Interventional radiology will follow all in patient and arrange 1 month follow-up as an outpatient with lower extremity venous duplex and CT venogram abdomen pelvis. Marliss Coots, MD [facility] Radiology Electronically Signed   By: Marliss Coots M.D.   On: 05/20/2023 09:54   IR IVC FILTER PLMT / S&I Lenise Arena GUID/MOD SED  Result Date: 05/20/2023 INDICATION: 67 year old female presenting with acute pulmonary embolism and left lower extremity swelling with acute appearing thrombus extending from the left common iliac vein in the left calf veins. Concern for May-Thurner morphology on CT. EXAM: 1. Ultrasound-guided vascular access of the right common femoral vein. 2. Inferior vena cavogram. 3. Placement of infrarenal IVC filter. 4. Left lower extremity venogram. 5. Intravascular ultrasound. 6. Aspiration thrombectomy of left iliac and femoral veins. 7. Balloon angioplasty of left iliac and femoral veins. 8. Ultrasound-guided vascular access of the left common femoral vein. 9. Left iliac venous  stent placement. COMPARISON:  None Available. MEDICATIONS: Benadryl 50 mg, intravenous ANESTHESIA/SEDATION: Versed 8 mg IV; Fentanyl 300 mcg IV Moderate Sedation Time:  118 minutes The patient was continuously monitored during the procedure by the interventional radiology nurse under my direct supervision. FLUOROSCOPY TIME:  One hundred forty-one mGy COMPLICATIONS: None immediate. TECHNIQUE: Informed written consent was obtained from the patient after a thorough discussion of the procedural risks, benefits and alternatives. All questions were addressed. Maximal Sterile Barrier Technique was utilized including caps, mask, sterile gowns, sterile gloves, sterile drape, hand hygiene and skin antiseptic. A timeout was performed prior to the initiation of the procedure. Preprocedure ultrasound evaluation of the right common femoral vein demonstrated patency and compressibility. Subdermal Local anesthesia was provided at the planned needle entry site. A small skin nick was made. Under direct ultrasound visualization, the right common femoral vein was accessed with a 21 gauge micropuncture needle. A micropuncture sheath was placed and a Bentson wire was directed to the inferior vena cava. An 8 French sheath was then placed over the wire and inferior vena cavagram was performed which demonstrated normal caliber, widely patent inferior vena cava without vascular anomaly. Over the wire, an Argon option elite filter was introduced and deployed in infrarenal location under fluoroscopic visualization. Post deployment inferior vena cavogram demonstrated proper  positioning. Two Perclose devices were then pre deployed at the 10 o'clock and 2 o'clock positions in the right common femoral vein after removal of the 8 [place] sheath. After serial dilation was performed, a 33 cm 16 French dry seal sheath was inserted and positioned near the iliac vein confluence. The left common iliac vein was then cannulated using an Omni flush followed  by a C2 catheter and Glidewire. The catheter was directed to the mid to peripheral left femoral vein. An exchange Rosen wire was then placed in the catheter was removed. Intravascular ultrasound was then performed with a Phillips 0.035 inch IVUS catheter. There was extensive, acute to subacute appearing thrombus throughout the iliac and femoral veins. Of note, there was severe focal compression of the left common iliac vein at the site of overlying right common iliac artery. Next, under intermittent fluoroscopic visualization, the 16 French penumbra flash aspiration catheter was inserted over the wire and aspiration was performed throughout the left common iliac, external iliac, common femoral, and central to mid femoral vein. There was copious acute and subacute appearing thrombus in the aspiration canister. Approximately 4 passes were made through the segments. The aspiration catheter was removed and exchanged for a 4 French, 90 cm Navicross catheter which was positioned in the peripheral femoral vein. Left lower extremity venogram was performed which demonstrated persistent filling defects in the femoral vein without significant opacification of the iliac veins. Next, balloon maceration was performed throughout the thrombosed segment with a 10 mm by 80 mm Athletis balloon in the iliac veins and 8 cm x 40 mm mustang balloon throughout the central aspect of the femoral vein. After angioplasty, repeat venogram from the central femoral vein demonstrated significantly improved patency inflow with persistent mild luminal irregularity and stenosis in the left common iliac vein. Ultrasound evaluation of the left common femoral vein demonstrated a patent central channel with persistent peripheral, subacute appearing thrombus. Subdermal Local anesthesia was administered at the planned needle entry site. A small skin nick was made. Under direct ultrasound visualization, the left common femoral vein was accessed with a 21  gauge micropuncture needle. A micropuncture sheath was inserted in a Bentson wire was directed to the inferior vena cava. The micropuncture sheath was exchanged for a 9 French, 10 cm vascular sheath. 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 Intravascular ultrasound was used for measurement purposes of the vein. Left pelvic venogram was then performed confirming the findings on intravascular ultrasound. Under direct fluoroscopic visualization, a 14 mm x 150 mm operate stent was then deployed. Post deployment balloon venoplasty was then performed with a 14 mm x 4 cm atlas balloon throughout the length of the stent. Completion bilateral pelvic venogram demonstrated patency of the stent with antegrade flow into the inferior vena cava. There were a few non flow limiting luminal filling defects throughout the stented segment. Completion inferior vena cavogram was then performed which demonstrated small focal filling defect within the apex of the implanted filter compatible with small volume of migrated thrombus. The left common femoral vein sheath was removed and hemostasis was achieved with manual compression. Sterile bandage was applied. The right common femoral sheath was then removed and the 2 Perclose devices were tied which achieve immediate hemostasis. A sterile bandage was applied. The patient tolerated the procedure  well was transferred back to the floor in good condition. IMPRESSION: 1. Acute to subacute left lower extremity deep vein thrombosis extending from the left common iliac veins the calf veins. 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 2. Technically successful placement  of inferior vena cava filter. Interventional radiology will follow the patient as an outpatient with plans to evaluate for filter retrieval in approximately 3 months. 3. Technically successful computer-assisted aspiration thrombectomy and balloon venoplasty of the left iliac and femoral veins. 4. Technically successful placement of uncovered, self expanding stent (14 x 150 mm Abre) spanning the left common through left external iliac veins. PLAN: Discontinue heparin infusion in begin therapeutic Lovenox injections. The first dose was administered in Interventional Radiology. Recommend left lower extremity compression hose and SCDs and encourage ambulation. Recommend Lovenox for at least 1 month prior to switching to oral anticoagulation. Interventional radiology will follow all in patient and arrange 1 month follow-up as an outpatient with lower extremity venous duplex and CT venogram abdomen pelvis. Marliss Coots, MD [facility] Radiology Electronically Signed   By: Marliss Coots M.D.   On: 05/20/2023 09:54   IR PTA VENOUS EXCEPT DIALYSIS CIRCUIT  Result Date: 05/20/2023 INDICATION: 67 year old female presenting with acute pulmonary embolism and left lower extremity swelling with acute appearing thrombus extending from the left common iliac vein in the left calf veins. Concern for May-Thurner morphology on CT. EXAM: 1. Ultrasound-guided vascular access of the right common femoral vein. 2. Inferior vena cavogram. 3. Placement of infrarenal IVC filter. 4. Left lower extremity venogram. 5. Intravascular ultrasound. 6. Aspiration thrombectomy of left iliac and femoral veins. 7. Balloon angioplasty of left iliac and femoral veins. 8. Ultrasound-guided vascular access of the left common femoral vein. 9. Left iliac venous stent placement. COMPARISON:  None Available. MEDICATIONS: Benadryl 50 mg, intravenous ANESTHESIA/SEDATION: Versed 8 mg IV; Fentanyl 300 mcg IV  Moderate Sedation Time:  118 minutes The patient was continuously monitored during the procedure by the interventional radiology nurse under my direct supervision. FLUOROSCOPY TIME:  One hundred forty-one mGy COMPLICATIONS: None immediate. TECHNIQUE: Informed written consent was obtained from the patient after a thorough discussion of the procedural risks, benefits and alternatives. All questions were addressed. Maximal Sterile Barrier Technique was utilized including caps, mask, sterile gowns, sterile gloves, sterile drape, hand hygiene and skin antiseptic. A timeout was performed prior to the initiation of the procedure. Preprocedure ultrasound evaluation of the right common femoral vein demonstrated patency and compressibility. Subdermal Local anesthesia was provided at the planned needle entry site. A small skin nick was made. Under direct ultrasound visualization, the right common femoral vein was accessed with a 21 gauge micropuncture needle. A micropuncture sheath was placed and a Bentson wire was directed to the inferior vena cava. An 8 French sheath was then placed over the wire and inferior vena cavagram was performed which demonstrated normal caliber, widely patent inferior vena cava without vascular anomaly. Over the wire, an Argon option elite filter was introduced and deployed in infrarenal location under fluoroscopic visualization. Post deployment inferior vena cavogram demonstrated proper positioning. Two Perclose devices were then pre deployed at the 10 o'clock and 2 o'clock positions in the right common femoral vein after removal of the 8 [place] sheath. After serial dilation was performed, a 33 cm 16 French dry seal sheath was inserted and positioned near the iliac vein confluence. The left common iliac vein  was then cannulated using an Omni flush followed by a C2 catheter and Glidewire. The catheter was directed to the mid to peripheral left femoral vein. An exchange Rosen wire was then placed in  the catheter was removed. Intravascular ultrasound was then performed with a Phillips 0.035 inch IVUS catheter. There was extensive, acute to subacute appearing thrombus throughout the iliac and femoral veins. Of note, there was severe focal compression of the left common iliac vein at the site of overlying right common iliac artery. Next, under intermittent fluoroscopic visualization, the 16 French penumbra flash aspiration catheter was inserted over the wire and aspiration was performed throughout the left common iliac, external iliac, common femoral, and central to mid femoral vein. There was copious acute and subacute appearing thrombus in the aspiration canister. Approximately 4 passes were made through the segments. The aspiration catheter was removed and exchanged for a 4 French, 90 cm Navicross catheter which was positioned in the peripheral femoral vein. Left lower extremity venogram was performed which demonstrated persistent filling defects in the femoral vein without significant opacification of the iliac veins. Next, balloon maceration was performed throughout the thrombosed segment with a 10 mm by 80 mm Athletis balloon in the iliac veins and 8 cm x 40 mm mustang balloon throughout the central aspect of the femoral vein. After angioplasty, repeat venogram from the central femoral vein demonstrated significantly improved patency inflow with persistent mild luminal irregularity and stenosis in the left common iliac vein. Ultrasound evaluation of the left common femoral vein demonstrated a patent central channel with persistent peripheral, subacute appearing thrombus. Subdermal Local anesthesia was administered at the planned needle entry site. A small skin nick was made. Under direct ultrasound visualization, the left common femoral vein was accessed with a 21 gauge micropuncture needle. A micropuncture sheath was inserted in a Bentson wire was directed to the inferior vena cava. The micropuncture sheath  was exchanged for a 9 French, 10 cm vascular sheath. 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 Intravascular ultrasound was used for measurement purposes of the vein. Left pelvic venogram was then performed confirming the findings on intravascular ultrasound. Under direct fluoroscopic visualization, a 14 mm x 150 mm operate stent was then deployed. Post deployment balloon venoplasty was then performed with a 14 mm x 4 cm atlas balloon throughout the length of the stent. Completion bilateral pelvic venogram demonstrated patency of the stent with antegrade flow into the inferior vena cava. There were a few non flow limiting luminal filling defects throughout the stented segment. Completion inferior vena cavogram was then performed which demonstrated small focal filling defect within the apex of the implanted filter compatible with small volume of migrated thrombus. The left common femoral vein sheath was removed and hemostasis was achieved with manual compression. Sterile bandage was applied. The right common femoral sheath was then removed and the 2 Perclose devices were tied which achieve immediate hemostasis. A sterile bandage was applied. The patient tolerated the procedure well was transferred back to the floor in good condition. IMPRESSION: 1. Acute to subacute left lower extremity deep vein thrombosis extending from the left common iliac veins the calf veins. 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 2. Technically successful placement  of inferior vena cava filter. Interventional radiology will follow the patient as an outpatient with plans to evaluate for filter retrieval in  approximately 3 months. 3. Technically successful computer-assisted aspiration thrombectomy and balloon venoplasty of the left iliac and femoral veins. 4. Technically successful placement of uncovered, self expanding stent (14 x 150 mm Abre) spanning the left common through left external iliac veins. PLAN: Discontinue heparin infusion in begin therapeutic Lovenox injections. The first dose was administered in Interventional Radiology. Recommend left lower extremity compression hose and SCDs and encourage ambulation. Recommend Lovenox for at least 1 month prior to switching to oral anticoagulation. Interventional radiology will follow all in patient and arrange 1 month follow-up as an outpatient with lower extremity venous duplex and CT venogram abdomen pelvis. Marliss Coots, MD [facility] Radiology Electronically Signed   By: Marliss Coots M.D.   On: 05/20/2023 09:54   IR US Guide Vasc Access Left  Result Date: 05/20/2023 INDICATION: 67 year old female presenting with acute pulmonary embolism and left lower extremity swelling with acute appearing thrombus extending from the left common iliac vein in the left calf veins. Concern for May-Thurner morphology on CT. EXAM: 1. Ultrasound-guided vascular access of the right common femoral vein. 2. Inferior vena cavogram. 3. Placement of infrarenal IVC filter. 4. Left lower extremity venogram. 5. Intravascular ultrasound. 6. Aspiration thrombectomy of left iliac and femoral veins. 7. Balloon angioplasty of left iliac and femoral veins. 8. Ultrasound-guided vascular access of the left common femoral vein. 9. Left iliac venous stent placement. COMPARISON:  None Available. MEDICATIONS: Benadryl 50 mg, intravenous ANESTHESIA/SEDATION: Versed 8 mg IV; Fentanyl 300 mcg IV Moderate Sedation Time:  118 minutes The patient was continuously monitored during the procedure by the interventional radiology nurse under my direct  supervision. FLUOROSCOPY TIME:  One hundred forty-one mGy COMPLICATIONS: None immediate. TECHNIQUE: Informed written consent was obtained from the patient after a thorough discussion of the procedural risks, benefits and alternatives. All questions were addressed. Maximal Sterile Barrier Technique was utilized including caps, mask, sterile gowns, sterile gloves, sterile drape, hand hygiene and skin antiseptic. A timeout was performed prior to the initiation of the procedure. Preprocedure ultrasound evaluation of the right common femoral vein demonstrated patency and compressibility. Subdermal Local anesthesia was provided at the planned needle entry site. A small skin nick was made. Under direct ultrasound visualization, the right common femoral vein was accessed with a 21 gauge micropuncture needle. A micropuncture sheath was placed and a Bentson wire was directed to the inferior vena cava. An 8 French sheath was then placed over the wire and inferior vena cavagram was performed which demonstrated normal caliber, widely patent inferior vena cava without vascular anomaly. Over the wire, an Argon option elite filter was introduced and deployed in infrarenal location under fluoroscopic visualization. Post deployment inferior vena cavogram demonstrated proper positioning. Two Perclose devices were then pre deployed at the 10 o'clock and 2 o'clock positions in the right common femoral vein after removal of the 8 [place] sheath. After serial dilation was performed, a 33 cm 16 French dry seal sheath was inserted and positioned near the iliac vein confluence. The left common iliac vein was then cannulated using an Omni flush followed by a C2 catheter and Glidewire. The catheter was directed to the mid to peripheral left femoral vein. An exchange Rosen wire was then placed in the catheter was removed. Intravascular ultrasound was then performed with a Phillips 0.035 inch IVUS catheter. There was extensive, acute to subacute  appearing thrombus throughout the iliac and femoral veins. Of note, there was severe focal compression of the left common iliac vein at the site of overlying right common iliac artery. Next, under intermittent fluoroscopic visualization, the 16 French penumbra flash aspiration catheter was inserted over the wire and aspiration was performed throughout the left common iliac, external iliac, common femoral, and central to mid femoral vein. There was copious acute and subacute appearing thrombus in the aspiration canister. Approximately 4 passes were made through the segments. The aspiration catheter was removed and exchanged for a 4 French, 90 cm Navicross catheter which was positioned in the peripheral femoral vein. Left lower extremity venogram was performed which demonstrated persistent filling defects in the femoral vein without significant opacification of the iliac veins. Next, balloon maceration was performed throughout the thrombosed segment with a 10 mm by 80 mm Athletis balloon in the iliac veins and 8 cm x 40 mm mustang balloon throughout the central aspect of the femoral vein. After angioplasty, repeat venogram from the central femoral vein demonstrated significantly improved patency inflow with persistent mild luminal irregularity and stenosis in the left common iliac vein. Ultrasound evaluation of the left common femoral vein demonstrated a patent central channel with persistent peripheral, subacute appearing thrombus. Subdermal Local anesthesia was administered at the planned needle entry site. A small skin nick was made. Under direct ultrasound visualization, the left common femoral vein was accessed with a 21 gauge micropuncture needle. A micropuncture sheath was inserted in a Bentson wire was directed to the inferior vena cava. The micropuncture sheath was exchanged for a 9 French, 10 cm vascular sheath. 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 Intravascular ultrasound was used for measurement purposes of the vein. Left pelvic venogram was then performed confirming the findings on intravascular ultrasound. Under direct fluoroscopic visualization, a 14 mm x 150 mm operate stent was then deployed. Post deployment balloon venoplasty was then performed with a 14 mm x 4 cm atlas balloon throughout the length of the stent. Completion bilateral pelvic venogram demonstrated patency of the stent with antegrade flow into the inferior vena cava. There were a few non flow limiting luminal filling defects throughout the stented segment. Completion inferior vena cavogram was then performed which demonstrated small focal filling defect within the apex of the implanted filter compatible with small volume of migrated thrombus. The left common femoral vein sheath was removed and hemostasis was achieved with manual compression. Sterile bandage was applied. The right common femoral sheath was then removed and the 2 Perclose devices were tied which achieve immediate hemostasis. A sterile bandage was applied. The patient tolerated the procedure well was transferred back to the floor in good condition. IMPRESSION: 1. Acute to subacute left lower extremity deep vein thrombosis extending from the left common iliac veins the calf veins. 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 2. Technically successful placement of inferior vena cava filter. Interventional radiology will follow the patient as an outpatient with plans to evaluate for filter retrieval in approximately 3 months. 3. Technically successful computer-assisted aspiration thrombectomy and balloon venoplasty of the left iliac and femoral  veins. 4. Technically successful placement of uncovered, self expanding stent (14 x 150 mm Abre) spanning  the left common through left external iliac veins. PLAN: Discontinue heparin infusion in begin therapeutic Lovenox injections. The first dose was administered in Interventional Radiology. Recommend left lower extremity compression hose and SCDs and encourage ambulation. Recommend Lovenox for at least 1 month prior to switching to oral anticoagulation. Interventional radiology will follow all in patient and arrange 1 month follow-up as an outpatient with lower extremity venous duplex and CT venogram abdomen pelvis. Marliss Coots, MD [facility] Radiology Electronically Signed   By: Marliss Coots M.D.   On: 05/20/2023 09:54   IR Venocavagram Ivc  Result Date: 05/20/2023 INDICATION: 67 year old female presenting with acute pulmonary embolism and left lower extremity swelling with acute appearing thrombus extending from the left common iliac vein in the left calf veins. Concern for May-Thurner morphology on CT. EXAM: 1. Ultrasound-guided vascular access of the right common femoral vein. 2. Inferior vena cavogram. 3. Placement of infrarenal IVC filter. 4. Left lower extremity venogram. 5. Intravascular ultrasound. 6. Aspiration thrombectomy of left iliac and femoral veins. 7. Balloon angioplasty of left iliac and femoral veins. 8. Ultrasound-guided vascular access of the left common femoral vein. 9. Left iliac venous stent placement. COMPARISON:  None Available. MEDICATIONS: Benadryl 50 mg, intravenous ANESTHESIA/SEDATION: Versed 8 mg IV; Fentanyl 300 mcg IV Moderate Sedation Time:  118 minutes The patient was continuously monitored during the procedure by the interventional radiology nurse under my direct supervision. FLUOROSCOPY TIME:  One hundred forty-one mGy COMPLICATIONS: None immediate. TECHNIQUE: Informed written consent was obtained from the patient after a  thorough discussion of the procedural risks, benefits and alternatives. All questions were addressed. Maximal Sterile Barrier Technique was utilized including caps, mask, sterile gowns, sterile gloves, sterile drape, hand hygiene and skin antiseptic. A timeout was performed prior to the initiation of the procedure. Preprocedure ultrasound evaluation of the right common femoral vein demonstrated patency and compressibility. Subdermal Local anesthesia was provided at the planned needle entry site. A small skin nick was made. Under direct ultrasound visualization, the right common femoral vein was accessed with a 21 gauge micropuncture needle. A micropuncture sheath was placed and a Bentson wire was directed to the inferior vena cava. An 8 French sheath was then placed over the wire and inferior vena cavagram was performed which demonstrated normal caliber, widely patent inferior vena cava without vascular anomaly. Over the wire, an Argon option elite filter was introduced and deployed in infrarenal location under fluoroscopic visualization. Post deployment inferior vena cavogram demonstrated proper positioning. Two Perclose devices were then pre deployed at the 10 o'clock and 2 o'clock positions in the right common femoral vein after removal of the 8 [place] sheath. After serial dilation was performed, a 33 cm 16 French dry seal sheath was inserted and positioned near the iliac vein confluence. The left common iliac vein was then cannulated using an Omni flush followed by a C2 catheter and Glidewire. The catheter was directed to the mid to peripheral left femoral vein. An exchange Rosen wire was then placed in the catheter was removed. Intravascular ultrasound was then performed with a Phillips 0.035 inch IVUS catheter. There was extensive, acute to subacute appearing thrombus throughout the iliac and femoral veins. Of note, there was severe focal compression of the left common iliac vein at the site of overlying right  common iliac artery. Next, under intermittent fluoroscopic visualization, the 16 French penumbra flash aspiration catheter was inserted over the wire and aspiration was performed throughout the left common iliac, external iliac, common  femoral, and central to mid femoral vein. There was copious acute and subacute appearing thrombus in the aspiration canister. Approximately 4 passes were made through the segments. The aspiration catheter was removed and exchanged for a 4 French, 90 cm Navicross catheter which was positioned in the peripheral femoral vein. Left lower extremity venogram was performed which demonstrated persistent filling defects in the femoral vein without significant opacification of the iliac veins. Next, balloon maceration was performed throughout the thrombosed segment with a 10 mm by 80 mm Athletis balloon in the iliac veins and 8 cm x 40 mm mustang balloon throughout the central aspect of the femoral vein. After angioplasty, repeat venogram from the central femoral vein demonstrated significantly improved patency inflow with persistent mild luminal irregularity and stenosis in the left common iliac vein. Ultrasound evaluation of the left common femoral vein demonstrated a patent central channel with persistent peripheral, subacute appearing thrombus. Subdermal Local anesthesia was administered at the planned needle entry site. A small skin nick was made. Under direct ultrasound visualization, the left common femoral vein was accessed with a 21 gauge micropuncture needle. A micropuncture sheath was inserted in a Bentson wire was directed to the inferior vena cava. The micropuncture sheath was exchanged for a 9 French, 10 cm vascular sheath. 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 Intravascular ultrasound was used for measurement purposes of the vein. Left pelvic venogram was then performed confirming the findings on intravascular ultrasound. Under direct fluoroscopic visualization, a 14 mm x 150 mm operate stent was then deployed. Post deployment balloon venoplasty was then performed with a 14 mm x 4 cm atlas balloon throughout the length of the stent. Completion bilateral pelvic venogram demonstrated patency of the stent with antegrade flow into the inferior vena cava. There were a few non flow limiting luminal filling defects throughout the stented segment. Completion inferior vena cavogram was then performed which demonstrated small focal filling defect within the apex of the implanted filter compatible with small volume of migrated thrombus. The left common femoral vein sheath was removed and hemostasis was achieved with manual compression. Sterile bandage was applied. The right common femoral sheath was then removed and the 2 Perclose devices were tied which achieve immediate hemostasis. A sterile bandage was applied. The patient tolerated the procedure well was transferred back to the floor in good condition. IMPRESSION: 1. Acute to subacute left lower extremity deep vein thrombosis extending from the left common iliac veins the calf veins. 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 2. Technically successful placement of inferior vena cava filter. Interventional radiology will follow the patient as an outpatient with plans to evaluate for filter retrieval in approximately 3 months. 3. Technically successful computer-assisted aspiration thrombectomy and balloon venoplasty of the left iliac and femoral veins. 4. Technically successful placement of uncovered, self expanding stent (14 x 150 mm Abre) spanning the left common through left external iliac veins. PLAN:  Discontinue heparin infusion in begin therapeutic Lovenox injections. The first dose was administered in Interventional Radiology. Recommend left lower extremity compression hose and SCDs and encourage ambulation. Recommend Lovenox for at least 1 month prior to switching to oral anticoagulation. Interventional radiology will follow all in patient and arrange 1  month follow-up as an outpatient with lower extremity venous duplex and CT venogram abdomen pelvis. Marliss Coots, MD [facility] Radiology Electronically Signed   By: Marliss Coots M.D.   On: 05/20/2023 09:54   IR Veno/Ext/Uni Left  Result Date: 05/20/2023 INDICATION: 67 year old female presenting with acute pulmonary embolism and left lower extremity swelling with acute appearing thrombus extending from the left common iliac vein in the left calf veins. Concern for May-Thurner morphology on CT. EXAM: 1. Ultrasound-guided vascular access of the right common femoral vein. 2. Inferior vena cavogram. 3. Placement of infrarenal IVC filter. 4. Left lower extremity venogram. 5. Intravascular ultrasound. 6. Aspiration thrombectomy of left iliac and femoral veins. 7. Balloon angioplasty of left iliac and femoral veins. 8. Ultrasound-guided vascular access of the left common femoral vein. 9. Left iliac venous stent placement. COMPARISON:  None Available. MEDICATIONS: Benadryl 50 mg, intravenous ANESTHESIA/SEDATION: Versed 8 mg IV; Fentanyl 300 mcg IV Moderate Sedation Time:  118 minutes The patient was continuously monitored during the procedure by the interventional radiology nurse under my direct supervision. FLUOROSCOPY TIME:  One hundred forty-one mGy COMPLICATIONS: None immediate. TECHNIQUE: Informed written consent was obtained from the patient after a thorough discussion of the procedural risks, benefits and alternatives. All questions were addressed. Maximal Sterile Barrier Technique was utilized including  caps, mask, sterile gowns, sterile gloves, sterile drape, hand hygiene and skin antiseptic. A timeout was performed prior to the initiation of the procedure. Preprocedure ultrasound evaluation of the right common femoral vein demonstrated patency and compressibility. Subdermal Local anesthesia was provided at the planned needle entry site. A small skin nick was made. Under direct ultrasound visualization, the right common femoral vein was accessed with a 21 gauge micropuncture needle. A micropuncture sheath was placed and a Bentson wire was directed to the inferior vena cava. An 8 French sheath was then placed over the wire and inferior vena cavagram was performed which demonstrated normal caliber, widely patent inferior vena cava without vascular anomaly. Over the wire, an Argon option elite filter was introduced and deployed in infrarenal location under fluoroscopic visualization. Post deployment inferior vena cavogram demonstrated proper positioning. Two Perclose devices were then pre deployed at the 10 o'clock and 2 o'clock positions in the right common femoral vein after removal of the 8 [place] sheath. After serial dilation was performed, a 33 cm 16 French dry seal sheath was inserted and positioned near the iliac vein confluence. The left common iliac vein was then cannulated using an Omni flush followed by a C2 catheter and Glidewire. The catheter was directed to the mid to peripheral left femoral vein. An exchange Rosen wire was then placed in the catheter was removed. Intravascular ultrasound was then performed with a Phillips 0.035 inch IVUS catheter. There was extensive, acute to subacute appearing thrombus throughout the iliac and femoral veins. Of note, there was severe focal compression of the left common iliac vein at the site of overlying right common iliac artery. Next, under intermittent fluoroscopic visualization, the 16 French penumbra flash aspiration catheter was inserted over the wire and  aspiration was performed throughout the left common iliac, external iliac, common femoral, and central to mid femoral vein. There was copious acute and subacute appearing thrombus in the aspiration canister. Approximately 4 passes were made through the segments. The aspiration catheter was removed and exchanged for a 4 French, 90 cm Navicross catheter which was positioned in the peripheral femoral vein. Left lower extremity venogram was performed which demonstrated persistent  filling defects in the femoral vein without significant opacification of the iliac veins. Next, balloon maceration was performed throughout the thrombosed segment with a 10 mm by 80 mm Athletis balloon in the iliac veins and 8 cm x 40 mm mustang balloon throughout the central aspect of the femoral vein. After angioplasty, repeat venogram from the central femoral vein demonstrated significantly improved patency inflow with persistent mild luminal irregularity and stenosis in the left common iliac vein. Ultrasound evaluation of the left common femoral vein demonstrated a patent central channel with persistent peripheral, subacute appearing thrombus. Subdermal Local anesthesia was administered at the planned needle entry site. A small skin nick was made. Under direct ultrasound visualization, the left common femoral vein was accessed with a 21 gauge micropuncture needle. A micropuncture sheath was inserted in a Bentson wire was directed to the inferior vena cava. The micropuncture sheath was exchanged for a 9 French, 10 cm vascular sheath. 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 Intravascular ultrasound was used for measurement purposes of the vein. Left pelvic venogram was then performed confirming the findings on  intravascular ultrasound. Under direct fluoroscopic visualization, a 14 mm x 150 mm operate stent was then deployed. Post deployment balloon venoplasty was then performed with a 14 mm x 4 cm atlas balloon throughout the length of the stent. Completion bilateral pelvic venogram demonstrated patency of the stent with antegrade flow into the inferior vena cava. There were a few non flow limiting luminal filling defects throughout the stented segment. Completion inferior vena cavogram was then performed which demonstrated small focal filling defect within the apex of the implanted filter compatible with small volume of migrated thrombus. The left common femoral vein sheath was removed and hemostasis was achieved with manual compression. Sterile bandage was applied. The right common femoral sheath was then removed and the 2 Perclose devices were tied which achieve immediate hemostasis. A sterile bandage was applied. The patient tolerated the procedure well was transferred back to the floor in good condition. IMPRESSION: 1. Acute to subacute left lower extremity deep vein thrombosis extending from the left common iliac veins the calf veins. 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 2. Technically successful placement of inferior vena cava filter. Interventional radiology will follow the patient as an outpatient with plans to evaluate for filter retrieval in approximately 3 months. 3. Technically successful computer-assisted aspiration thrombectomy and balloon venoplasty of the left iliac and femoral veins. 4. Technically successful placement of uncovered, self expanding stent (14 x 150 mm Abre) spanning the left common through left external iliac veins. PLAN: Discontinue heparin infusion in begin therapeutic Lovenox injections. The first dose was administered in Interventional Radiology. Recommend left lower extremity  compression hose and SCDs and encourage ambulation. Recommend Lovenox for at least 1 month prior to switching to oral anticoagulation. Interventional radiology will follow all in patient and arrange 1 month follow-up as an outpatient with lower extremity venous duplex and CT venogram abdomen pelvis. Marliss Coots, MD [facility] Radiology Electronically Signed   By: Marliss Coots M.D.   On: 05/20/2023 09:54   IR TRANSCATH PLC STENT 1ST ART NOT LE CV CAR VERT CAR  Result Date: 05/20/2023 INDICATION: 67 year old  female presenting with acute pulmonary embolism and left lower extremity swelling with acute appearing thrombus extending from the left common iliac vein in the left calf veins. Concern for May-Thurner morphology on CT. EXAM: 1. Ultrasound-guided vascular access of the right common femoral vein. 2. Inferior vena cavogram. 3. Placement of infrarenal IVC filter. 4. Left lower extremity venogram. 5. Intravascular ultrasound. 6. Aspiration thrombectomy of left iliac and femoral veins. 7. Balloon angioplasty of left iliac and femoral veins. 8. Ultrasound-guided vascular access of the left common femoral vein. 9. Left iliac venous stent placement. COMPARISON:  None Available. MEDICATIONS: Benadryl 50 mg, intravenous ANESTHESIA/SEDATION: Versed 8 mg IV; Fentanyl 300 mcg IV Moderate Sedation Time:  118 minutes The patient was continuously monitored during the procedure by the interventional radiology nurse under my direct supervision. FLUOROSCOPY TIME:  One hundred forty-one mGy COMPLICATIONS: None immediate. TECHNIQUE: Informed written consent was obtained from the patient after a thorough discussion of the procedural risks, benefits and alternatives. All questions were addressed. Maximal Sterile Barrier Technique was utilized including caps, mask, sterile gowns, sterile gloves, sterile drape, hand hygiene and skin antiseptic. A timeout was performed prior to the  initiation of the procedure. Preprocedure ultrasound evaluation of the right common femoral vein demonstrated patency and compressibility. Subdermal Local anesthesia was provided at the planned needle entry site. A small skin nick was made. Under direct ultrasound visualization, the right common femoral vein was accessed with a 21 gauge micropuncture needle. A micropuncture sheath was placed and a Bentson wire was directed to the inferior vena cava. An 8 French sheath was then placed over the wire and inferior vena cavagram was performed which demonstrated normal caliber, widely patent inferior vena cava without vascular anomaly. Over the wire, an Argon option elite filter was introduced and deployed in infrarenal location under fluoroscopic visualization. Post deployment inferior vena cavogram demonstrated proper positioning. Two Perclose devices were then pre deployed at the 10 o'clock and 2 o'clock positions in the right common femoral vein after removal of the 8 [place] sheath. After serial dilation was performed, a 33 cm 16 French dry seal sheath was inserted and positioned near the iliac vein confluence. The left common iliac vein was then cannulated using an Omni flush followed by a C2 catheter and Glidewire. The catheter was directed to the mid to peripheral left femoral vein. An exchange Rosen wire was then placed in the catheter was removed. Intravascular ultrasound was then performed with a Phillips 0.035 inch IVUS catheter. There was extensive, acute to subacute appearing thrombus throughout the iliac and femoral veins. Of note, there was severe focal compression of the left common iliac vein at the site of overlying right common iliac artery. Next, under intermittent fluoroscopic visualization, the 16 French penumbra flash aspiration catheter was inserted over the wire and aspiration was performed throughout the left common iliac, external iliac, common femoral, and central to mid femoral vein. There was  copious acute and subacute appearing thrombus in the aspiration canister. Approximately 4 passes were made through the segments. The aspiration catheter was removed and exchanged for a 4 French, 90 cm Navicross catheter which was positioned in the peripheral femoral vein. Left lower extremity venogram was performed which demonstrated persistent filling defects in the femoral vein without significant opacification of the iliac veins. Next, balloon maceration was performed throughout the thrombosed segment with a 10 mm by 80 mm Athletis balloon in the iliac veins and 8 cm x 40 mm mustang balloon throughout the central aspect of the femoral  vein. After angioplasty, repeat venogram from the central femoral vein demonstrated significantly improved patency inflow with persistent mild luminal irregularity and stenosis in the left common iliac vein. Ultrasound evaluation of the left common femoral vein demonstrated a patent central channel with persistent peripheral, subacute appearing thrombus. Subdermal Local anesthesia was administered at the planned needle entry site. A small skin nick was made. Under direct ultrasound visualization, the left common femoral vein was accessed with a 21 gauge micropuncture needle. A micropuncture sheath was inserted in a Bentson wire was directed to the inferior vena cava. The micropuncture sheath was exchanged for a 9 French, 10 cm vascular sheath. 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 Intravascular ultrasound was used for measurement purposes of the vein. Left pelvic venogram was then performed confirming the findings on intravascular ultrasound. Under direct fluoroscopic visualization, a 14 mm x 150 mm operate stent was then deployed. Post deployment  balloon venoplasty was then performed with a 14 mm x 4 cm atlas balloon throughout the length of the stent. Completion bilateral pelvic venogram demonstrated patency of the stent with antegrade flow into the inferior vena cava. There were a few non flow limiting luminal filling defects throughout the stented segment. Completion inferior vena cavogram was then performed which demonstrated small focal filling defect within the apex of the implanted filter compatible with small volume of migrated thrombus. The left common femoral vein sheath was removed and hemostasis was achieved with manual compression. Sterile bandage was applied. The right common femoral sheath was then removed and the 2 Perclose devices were tied which achieve immediate hemostasis. A sterile bandage was applied. The patient tolerated the procedure well was transferred back to the floor in good condition. IMPRESSION: 1. Acute to subacute left lower extremity deep vein thrombosis extending from the left common iliac veins the calf veins. 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 2. Technically successful placement of inferior vena cava filter. Interventional radiology will follow the patient as an outpatient with plans to evaluate for filter retrieval in approximately 3 months. 3. Technically successful computer-assisted aspiration thrombectomy and balloon venoplasty of the left iliac and femoral veins. 4. Technically successful placement of uncovered, self expanding stent (14 x 150 mm Abre) spanning the left common through left external iliac veins. PLAN: Discontinue heparin infusion in begin therapeutic Lovenox injections. The first dose was administered in Interventional Radiology. Recommend left lower extremity compression hose and SCDs and encourage ambulation. Recommend Lovenox for at least 1 month prior to switching to oral anticoagulation.  Interventional radiology will follow all in patient and arrange 1 month follow-up as an outpatient with lower extremity venous duplex and CT venogram abdomen pelvis. Marliss Coots, MD [facility] Radiology Electronically Signed   By: Marliss Coots M.D.   On: 05/20/2023 09:54        Scheduled Meds:  enoxaparin (LOVENOX) injection  80 mg Subcutaneous Q12H   folic acid  1 mg Oral Daily   insulin aspart  0-15 Units Subcutaneous TID WC   insulin aspart  0-5 Units Subcutaneous QHS   insulin glargine-yfgn  10 Units Subcutaneous Daily   losartan  25 mg Oral Daily   methotrexate  10 mg Oral  Weekly   predniSONE  20 mg Oral Q breakfast   sodium chloride flush  3 mL Intravenous Q12H   Continuous Infusions:  sodium chloride 75 mL/hr at 05/20/23 0356   diltiazem (CARDIZEM) infusion 5 mg/hr (05/20/23 1141)     LOS: 6 days    Time spent: 35 minutes.    Berton Mount, MD  [facility] Pager #: [phone] 7PM-7AM contact night coverage as above

## 2023-05-20 NOTE — Progress Notes (Signed)
Referring Physician(s): Dr Leonia Corona  Supervising Physician: Marliss Coots  Patient Status:  [facility] - In-pt  Chief Complaint:  LLE DVT  Subjective:  Procedure: IN IR 05/19/23 1) Left lower extremity venogram 2) Inferior venacavogram 3) IVC filter placement 4) Intravascular ultrasound 5) Left iliofemoral extremity aspiration thrombectomy 6) Balloon angioplasty of left iliofemoral veins 7) Left common to external iliac vein stent placement  Pt doing well this pm Left leg less swollen; less painful; moving  easier and better Pt able to ambulate with help Says she even breathing better today Compression stockings in place   Allergies: Aspirin, Ibuprofen, Other, and Propofol  Medications: Prior to Admission medications   Medication Sig Start Date End Date Taking? Authorizing Provider  calcium-vitamin D (OSCAL WITH D) 500-5 MG-MCG tablet Take 1 tablet by mouth daily with breakfast.   Yes [provider]  Cholecalciferol (VITAMIN D3) 50 MCG (2000 UT) capsule Take 2,000 Units by mouth daily.   Yes [provider]  losartan (COZAAR) 25 MG tablet TAKE 1 TABLET BY MOUTH EVERY DAY FOR BLOOD PRESSURE 01/21/23  Yes [name], NP  methotrexate (RHEUMATREX) 2.5 MG tablet Take 10 mg by mouth once a week. 04/28/23  Yes [provider]  omeprazole (PRILOSEC) 20 MG capsule Take 20 mg by mouth daily.   Yes [provider]  predniSONE (DELTASONE) 10 MG tablet Take 25 mg by mouth daily in the afternoon. 01/14/23  Yes [provider]  scopolamine (TRANSDERM-SCOP) 1 MG/3DAYS Place 1 patch (1.5 mg total) onto the skin every 3 (three) days. Patient not taking: Reported on 05/05/2023 03/04/23   [name], NP     Vital Signs: BP 132/77 (BP Location: Left Arm)   Pulse 98   Temp 98.2 F (36.8 C) (Oral)   Resp 20   Ht 5\' 8"  (1.727 m)   Wt 179 lb 0.2 oz (81.2 kg)   SpO2 97%   BMI 27.22 kg/m   Physical Exam Vitals reviewed.   Musculoskeletal:     Left lower leg: Edema present.     Comments: Left leg slightly larger that Rt Smaller though than yesterday per pt Able to move toes easily Able to walk with help Can place pressure on foot with much less discomfort No redness  Skin:    General: Skin is warm.     Comments: Bilat groin sites are clean and dry NT no bleeding No hematoma Good pulses Bilat  Neurological:     Mental Status: She is alert.     Imaging: IR [facility] MOD SED  Result Date: 05/20/2023 INDICATION: 67 year old female presenting with acute pulmonary embolism and left lower extremity swelling with acute appearing thrombus extending from the left common iliac vein in the left calf veins. Concern for May-Thurner morphology on CT. EXAM: 1. Ultrasound-guided vascular access of the right common femoral vein. 2. Inferior vena cavogram. 3. Placement of infrarenal IVC filter. 4. Left lower extremity venogram. 5. Intravascular ultrasound. 6. Aspiration thrombectomy of left iliac and femoral veins. 7. Balloon angioplasty of left iliac and femoral veins. 8. Ultrasound-guided vascular access of the left common femoral vein. 9. Left iliac venous stent placement. COMPARISON:  None Available. MEDICATIONS: Benadryl 50 mg, intravenous ANESTHESIA/SEDATION: Versed 8 mg IV; Fentanyl 300 mcg IV Moderate Sedation Time:  118 minutes The patient was continuously monitored during the procedure by the interventional radiology nurse under my direct supervision. FLUOROSCOPY TIME:  One hundred forty-one mGy COMPLICATIONS: None immediate. TECHNIQUE: Informed written  consent was obtained from the patient after a thorough discussion of the procedural risks, benefits and alternatives. All questions were addressed. Maximal Sterile Barrier Technique was utilized including caps, mask, sterile gowns, sterile gloves, sterile drape, hand hygiene and skin antiseptic. A timeout was performed prior to the initiation of the procedure.  Preprocedure ultrasound evaluation of the right common femoral vein demonstrated patency and compressibility. Subdermal Local anesthesia was provided at the planned needle entry site. A small skin nick was made. Under direct ultrasound visualization, the right common femoral vein was accessed with a 21 gauge micropuncture needle. A micropuncture sheath was placed and a Bentson wire was directed to the inferior vena cava. An 8 French sheath was then placed over the wire and inferior vena cavagram was performed which demonstrated normal caliber, widely patent inferior vena cava without vascular anomaly. Over the wire, an Argon option elite filter was introduced and deployed in infrarenal location under fluoroscopic visualization. Post deployment inferior vena cavogram demonstrated proper positioning. Two Perclose devices were then pre deployed at the 10 o'clock and 2 o'clock positions in the right common femoral vein after removal of the 8 [place] sheath. After serial dilation was performed, a 33 cm 16 French dry seal sheath was inserted and positioned near the iliac vein confluence. The left common iliac vein was then cannulated using an Omni flush followed by a C2 catheter and Glidewire. The catheter was directed to the mid to peripheral left femoral vein. An exchange Rosen wire was then placed in the catheter was removed. Intravascular ultrasound was then performed with a Phillips 0.035 inch IVUS catheter. There was extensive, acute to subacute appearing thrombus throughout the iliac and femoral veins. Of note, there was severe focal compression of the left common iliac vein at the site of overlying right common iliac artery. Next, under intermittent fluoroscopic visualization, the 16 French penumbra flash aspiration catheter was inserted over the wire and aspiration was performed throughout the left common iliac, external iliac, common femoral, and central to mid femoral vein. There was copious acute and subacute  appearing thrombus in the aspiration canister. Approximately 4 passes were made through the segments. The aspiration catheter was removed and exchanged for a 4 French, 90 cm Navicross catheter which was positioned in the peripheral femoral vein. Left lower extremity venogram was performed which demonstrated persistent filling defects in the femoral vein without significant opacification of the iliac veins. Next, balloon maceration was performed throughout the thrombosed segment with a 10 mm by 80 mm Athletis balloon in the iliac veins and 8 cm x 40 mm mustang balloon throughout the central aspect of the femoral vein. After angioplasty, repeat venogram from the central femoral vein demonstrated significantly improved patency inflow with persistent mild luminal irregularity and stenosis in the left common iliac vein. Ultrasound evaluation of the left common femoral vein demonstrated a patent central channel with persistent peripheral, subacute appearing thrombus. Subdermal Local anesthesia was administered at the planned needle entry site. A small skin nick was made. Under direct ultrasound visualization, the left common femoral vein was accessed with a 21 gauge micropuncture needle. A micropuncture sheath was inserted in a Bentson wire was directed to the inferior vena cava. The micropuncture sheath was exchanged for a 9 French, 10 cm vascular sheath. 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 Intravascular ultrasound was used for measurement purposes of the vein. Left pelvic venogram was then performed confirming the findings on intravascular ultrasound. Under direct fluoroscopic visualization, a 14 mm x 150 mm operate stent was then deployed. Post deployment balloon venoplasty was then  performed with a 14 mm x 4 cm atlas balloon throughout the length of the stent. Completion bilateral pelvic venogram demonstrated patency of the stent with antegrade flow into the inferior vena cava. There were a few non flow limiting luminal filling defects throughout the stented segment. Completion inferior vena cavogram was then performed which demonstrated small focal filling defect within the apex of the implanted filter compatible with small volume of migrated thrombus. The left common femoral vein sheath was removed and hemostasis was achieved with manual compression. Sterile bandage was applied. The right common femoral sheath was then removed and the 2 Perclose devices were tied which achieve immediate hemostasis. A sterile bandage was applied. The patient tolerated the procedure well was transferred back to the floor in good condition. IMPRESSION: 1. Acute to subacute left lower extremity deep vein thrombosis extending from the left common iliac veins the calf veins. 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 2. Technically successful placement of inferior vena cava filter. Interventional radiology will follow the patient as an outpatient with plans to evaluate for filter retrieval in approximately 3 months. 3. Technically successful computer-assisted aspiration thrombectomy and balloon venoplasty of the left iliac and femoral veins. 4. Technically successful placement of uncovered, self expanding stent (14 x 150 mm Abre) spanning the left common through left external iliac veins. PLAN: Discontinue heparin infusion in begin therapeutic Lovenox injections. The first dose was administered in Interventional Radiology. Recommend left lower extremity compression hose and SCDs and encourage ambulation. Recommend Lovenox for at least 1 month prior to switching to oral anticoagulation. Interventional radiology will  follow all in patient and arrange 1 month follow-up as an outpatient with lower extremity venous duplex and CT venogram abdomen pelvis. Marliss Coots, MD [facility] Radiology Electronically Signed   By: Marliss Coots M.D.   On: 05/20/2023 09:54   IR INTRAVASCULAR ULTRASOUND NON CORONARY  Result Date: 05/20/2023 INDICATION: 67 year old female presenting with acute pulmonary embolism and left lower extremity swelling with acute appearing thrombus extending from the left common iliac vein in the left calf veins. Concern for May-Thurner morphology on CT. EXAM: 1. Ultrasound-guided vascular access of the right common femoral vein. 2. Inferior vena cavogram. 3. Placement of infrarenal IVC filter. 4. Left lower extremity venogram. 5. Intravascular ultrasound. 6. Aspiration thrombectomy of left iliac and femoral veins. 7. Balloon angioplasty of left iliac and femoral veins. 8. Ultrasound-guided vascular access of the left common femoral vein. 9. Left iliac venous stent placement. COMPARISON:  None Available. MEDICATIONS: Benadryl 50 mg, intravenous ANESTHESIA/SEDATION: Versed 8 mg IV; Fentanyl 300 mcg IV Moderate Sedation Time:  118 minutes The patient was continuously monitored during the procedure by the interventional radiology nurse under my direct supervision. FLUOROSCOPY TIME:  One hundred forty-one mGy COMPLICATIONS: None immediate. TECHNIQUE: Informed written consent was obtained from the patient after a thorough discussion of the procedural risks, benefits and alternatives. All questions were addressed. Maximal Sterile Barrier Technique was utilized including caps, mask, sterile gowns, sterile gloves, sterile drape, hand hygiene and skin antiseptic. A timeout was performed prior to the initiation of the procedure. Preprocedure ultrasound evaluation of the  right common femoral vein demonstrated patency and compressibility. Subdermal Local anesthesia was provided  at the planned needle entry site. A small skin nick was made. Under direct ultrasound visualization, the right common femoral vein was accessed with a 21 gauge micropuncture needle. A micropuncture sheath was placed and a Bentson wire was directed to the inferior vena cava. An 8 French sheath was then placed over the wire and inferior vena cavagram was performed which demonstrated normal caliber, widely patent inferior vena cava without vascular anomaly. Over the wire, an Argon option elite filter was introduced and deployed in infrarenal location under fluoroscopic visualization. Post deployment inferior vena cavogram demonstrated proper positioning. Two Perclose devices were then pre deployed at the 10 o'clock and 2 o'clock positions in the right common femoral vein after removal of the 8 [place] sheath. After serial dilation was performed, a 33 cm 16 French dry seal sheath was inserted and positioned near the iliac vein confluence. The left common iliac vein was then cannulated using an Omni flush followed by a C2 catheter and Glidewire. The catheter was directed to the mid to peripheral left femoral vein. An exchange Rosen wire was then placed in the catheter was removed. Intravascular ultrasound was then performed with a Phillips 0.035 inch IVUS catheter. There was extensive, acute to subacute appearing thrombus throughout the iliac and femoral veins. Of note, there was severe focal compression of the left common iliac vein at the site of overlying right common iliac artery. Next, under intermittent fluoroscopic visualization, the 16 French penumbra flash aspiration catheter was inserted over the wire and aspiration was performed throughout the left common iliac, external iliac, common femoral, and central to mid femoral vein. There was copious acute and subacute appearing thrombus in the aspiration canister. Approximately 4 passes were made through the segments. The aspiration catheter was removed and exchanged  for a 4 French, 90 cm Navicross catheter which was positioned in the peripheral femoral vein. Left lower extremity venogram was performed which demonstrated persistent filling defects in the femoral vein without significant opacification of the iliac veins. Next, balloon maceration was performed throughout the thrombosed segment with a 10 mm by 80 mm Athletis balloon in the iliac veins and 8 cm x 40 mm mustang balloon throughout the central aspect of the femoral vein. After angioplasty, repeat venogram from the central femoral vein demonstrated significantly improved patency inflow with persistent mild luminal irregularity and stenosis in the left common iliac vein. Ultrasound evaluation of the left common femoral vein demonstrated a patent central channel with persistent peripheral, subacute appearing thrombus. Subdermal Local anesthesia was administered at the planned needle entry site. A small skin nick was made. Under direct ultrasound visualization, the left common femoral vein was accessed with a 21 gauge micropuncture needle. A micropuncture sheath was inserted in a Bentson wire was directed to the inferior vena cava. The micropuncture sheath was exchanged for a 9 French, 10 cm vascular sheath. 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 Intravascular ultrasound was used for measurement purposes of the vein. Left pelvic venogram was then performed confirming the findings on intravascular ultrasound. Under direct fluoroscopic visualization, a 14 mm x 150 mm operate stent was then deployed. Post deployment balloon venoplasty was then performed with a 14 mm x 4 cm atlas balloon throughout  the length of the stent. Completion bilateral pelvic venogram demonstrated patency of the stent  with antegrade flow into the inferior vena cava. There were a few non flow limiting luminal filling defects throughout the stented segment. Completion inferior vena cavogram was then performed which demonstrated small focal filling defect within the apex of the implanted filter compatible with small volume of migrated thrombus. The left common femoral vein sheath was removed and hemostasis was achieved with manual compression. Sterile bandage was applied. The right common femoral sheath was then removed and the 2 Perclose devices were tied which achieve immediate hemostasis. A sterile bandage was applied. The patient tolerated the procedure well was transferred back to the floor in good condition. IMPRESSION: 1. Acute to subacute left lower extremity deep vein thrombosis extending from the left common iliac veins the calf veins. 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 2. Technically successful placement of inferior vena cava filter. Interventional radiology will follow the patient as an outpatient with plans to evaluate for filter retrieval in approximately 3 months. 3. Technically successful computer-assisted aspiration thrombectomy and balloon venoplasty of the left iliac and femoral veins. 4. Technically successful placement of uncovered, self expanding stent (14 x 150 mm Abre) spanning the left common through left external iliac veins. PLAN: Discontinue heparin infusion in begin therapeutic Lovenox injections. The first dose was administered in Interventional Radiology. Recommend left lower extremity compression hose and SCDs and encourage ambulation. Recommend Lovenox for at least 1 month prior to switching to oral anticoagulation. Interventional radiology will follow all in patient and arrange 1 month follow-up as an outpatient with lower extremity venous duplex and CT venogram abdomen pelvis. Marliss Coots,  MD [facility] Radiology Electronically Signed   By: Marliss Coots M.D.   On: 05/20/2023 09:54   IR US Guide Vasc Access Right  Result Date: 05/20/2023 INDICATION: 67 year old female presenting with acute pulmonary embolism and left lower extremity swelling with acute appearing thrombus extending from the left common iliac vein in the left calf veins. Concern for May-Thurner morphology on CT. EXAM: 1. Ultrasound-guided vascular access of the right common femoral vein. 2. Inferior vena cavogram. 3. Placement of infrarenal IVC filter. 4. Left lower extremity venogram. 5. Intravascular ultrasound. 6. Aspiration thrombectomy of left iliac and femoral veins. 7. Balloon angioplasty of left iliac and femoral veins. 8. Ultrasound-guided vascular access of the left common femoral vein. 9. Left iliac venous stent placement. COMPARISON:  None Available. MEDICATIONS: Benadryl 50 mg, intravenous ANESTHESIA/SEDATION: Versed 8 mg IV; Fentanyl 300 mcg IV Moderate Sedation Time:  118 minutes The patient was continuously monitored during the procedure by the interventional radiology nurse under my direct supervision. FLUOROSCOPY TIME:  One hundred forty-one mGy COMPLICATIONS: None immediate. TECHNIQUE: Informed written consent was obtained from the patient after a thorough discussion of the procedural risks, benefits and alternatives. All questions were addressed. Maximal Sterile Barrier Technique was utilized including caps, mask, sterile gowns, sterile gloves, sterile drape, hand hygiene and skin antiseptic. A timeout was performed prior to the initiation of the procedure. Preprocedure ultrasound evaluation of the right common femoral vein demonstrated patency and compressibility. Subdermal Local anesthesia was provided at the planned needle entry site. A small skin nick was made. Under direct ultrasound visualization, the right common femoral vein was accessed with a 21 gauge  micropuncture needle. A micropuncture sheath was placed and a Bentson wire was directed to the  inferior vena cava. An 8 French sheath was then placed over the wire and inferior vena cavagram was performed which demonstrated normal caliber, widely patent inferior vena cava without vascular anomaly. Over the wire, an Argon option elite filter was introduced and deployed in infrarenal location under fluoroscopic visualization. Post deployment inferior vena cavogram demonstrated proper positioning. Two Perclose devices were then pre deployed at the 10 o'clock and 2 o'clock positions in the right common femoral vein after removal of the 8 [place] sheath. After serial dilation was performed, a 33 cm 16 French dry seal sheath was inserted and positioned near the iliac vein confluence. The left common iliac vein was then cannulated using an Omni flush followed by a C2 catheter and Glidewire. The catheter was directed to the mid to peripheral left femoral vein. An exchange Rosen wire was then placed in the catheter was removed. Intravascular ultrasound was then performed with a Phillips 0.035 inch IVUS catheter. There was extensive, acute to subacute appearing thrombus throughout the iliac and femoral veins. Of note, there was severe focal compression of the left common iliac vein at the site of overlying right common iliac artery. Next, under intermittent fluoroscopic visualization, the 16 French penumbra flash aspiration catheter was inserted over the wire and aspiration was performed throughout the left common iliac, external iliac, common femoral, and central to mid femoral vein. There was copious acute and subacute appearing thrombus in the aspiration canister. Approximately 4 passes were made through the segments. The aspiration catheter was removed and exchanged for a 4 French, 90 cm Navicross catheter which was positioned in the peripheral femoral vein. Left lower extremity venogram was performed which demonstrated  persistent filling defects in the femoral vein without significant opacification of the iliac veins. Next, balloon maceration was performed throughout the thrombosed segment with a 10 mm by 80 mm Athletis balloon in the iliac veins and 8 cm x 40 mm mustang balloon throughout the central aspect of the femoral vein. After angioplasty, repeat venogram from the central femoral vein demonstrated significantly improved patency inflow with persistent mild luminal irregularity and stenosis in the left common iliac vein. Ultrasound evaluation of the left common femoral vein demonstrated a patent central channel with persistent peripheral, subacute appearing thrombus. Subdermal Local anesthesia was administered at the planned needle entry site. A small skin nick was made. Under direct ultrasound visualization, the left common femoral vein was accessed with a 21 gauge micropuncture needle. A micropuncture sheath was inserted in a Bentson wire was directed to the inferior vena cava. The micropuncture sheath was exchanged for a 9 French, 10 cm vascular sheath. 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 Intravascular ultrasound was used for measurement purposes of the vein. Left pelvic venogram was then performed confirming the findings on intravascular ultrasound. Under direct fluoroscopic visualization, a 14 mm x 150 mm operate stent was then deployed. Post deployment balloon venoplasty was then performed with a 14 mm x 4 cm atlas balloon throughout the length of the stent. Completion bilateral pelvic venogram demonstrated patency of the stent with antegrade flow into the inferior vena cava. There were a few non flow limiting luminal filling defects throughout the stented segment. Completion  inferior vena cavogram was then performed which demonstrated small focal filling defect within the apex of the implanted  filter compatible with small volume of migrated thrombus. The left common femoral vein sheath was removed and hemostasis was achieved with manual compression. Sterile bandage was applied. The right common femoral sheath was then removed and the 2 Perclose devices were tied which achieve immediate hemostasis. A sterile bandage was applied. The patient tolerated the procedure well was transferred back to the floor in good condition. IMPRESSION: 1. Acute to subacute left lower extremity deep vein thrombosis extending from the left common iliac veins the calf veins. 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 2. Technically successful placement of inferior vena cava filter. Interventional radiology will follow the patient as an outpatient with plans to evaluate for filter retrieval in approximately 3 months. 3. Technically successful computer-assisted aspiration thrombectomy and balloon venoplasty of the left iliac and femoral veins. 4. Technically successful placement of uncovered, self expanding stent (14 x 150 mm Abre) spanning the left common through left external iliac veins. PLAN: Discontinue heparin infusion in begin therapeutic Lovenox injections. The first dose was administered in Interventional Radiology. Recommend left lower extremity compression hose and SCDs and encourage ambulation. Recommend Lovenox for at least 1 month prior to switching to oral anticoagulation. Interventional radiology will follow all in patient and arrange 1 month follow-up as an outpatient with lower extremity venous duplex and CT venogram abdomen pelvis. Marliss Coots, MD [facility] Radiology Electronically Signed   By: Marliss Coots M.D.   On: 05/20/2023 09:54   IR  IVC FILTER PLMT / S&I Lenise Arena GUID/MOD SED  Result Date: 05/20/2023 INDICATION: 67 year old female presenting with acute pulmonary embolism and left lower extremity swelling with acute appearing thrombus extending from the left common iliac vein in the left calf veins. Concern for May-Thurner morphology on CT. EXAM: 1. Ultrasound-guided vascular access of the right common femoral vein. 2. Inferior vena cavogram. 3. Placement of infrarenal IVC filter. 4. Left lower extremity venogram. 5. Intravascular ultrasound. 6. Aspiration thrombectomy of left iliac and femoral veins. 7. Balloon angioplasty of left iliac and femoral veins. 8. Ultrasound-guided vascular access of the left common femoral vein. 9. Left iliac venous stent placement. COMPARISON:  None Available. MEDICATIONS: Benadryl 50 mg, intravenous ANESTHESIA/SEDATION: Versed 8 mg IV; Fentanyl 300 mcg IV Moderate Sedation Time:  118 minutes The patient was continuously monitored during the procedure by the interventional radiology nurse under my direct supervision. FLUOROSCOPY TIME:  One hundred forty-one mGy COMPLICATIONS: None immediate. TECHNIQUE: Informed written consent was obtained from the patient after a thorough discussion of the procedural risks, benefits and alternatives. All questions were addressed. Maximal Sterile Barrier Technique was utilized including caps, mask, sterile gowns, sterile gloves, sterile drape, hand hygiene and skin antiseptic. A timeout was performed prior to the initiation of the procedure. Preprocedure ultrasound evaluation of the right common femoral vein demonstrated patency and compressibility. Subdermal Local anesthesia was provided at the planned needle entry site. A small skin nick was made. Under direct ultrasound visualization, the right common femoral vein was accessed with a 21 gauge micropuncture needle. A micropuncture sheath was placed and a Bentson wire was directed to the inferior vena cava. An 8 French sheath was  then placed over the wire and inferior vena cavagram was performed which demonstrated normal caliber, widely patent inferior vena cava without vascular anomaly. Over the wire, an Argon option elite filter was introduced and deployed in infrarenal location under fluoroscopic visualization. Post deployment inferior vena  cavogram demonstrated proper positioning. Two Perclose devices were then pre deployed at the 10 o'clock and 2 o'clock positions in the right common femoral vein after removal of the 8 [place] sheath. After serial dilation was performed, a 33 cm 16 French dry seal sheath was inserted and positioned near the iliac vein confluence. The left common iliac vein was then cannulated using an Omni flush followed by a C2 catheter and Glidewire. The catheter was directed to the mid to peripheral left femoral vein. An exchange Rosen wire was then placed in the catheter was removed. Intravascular ultrasound was then performed with a Phillips 0.035 inch IVUS catheter. There was extensive, acute to subacute appearing thrombus throughout the iliac and femoral veins. Of note, there was severe focal compression of the left common iliac vein at the site of overlying right common iliac artery. Next, under intermittent fluoroscopic visualization, the 16 French penumbra flash aspiration catheter was inserted over the wire and aspiration was performed throughout the left common iliac, external iliac, common femoral, and central to mid femoral vein. There was copious acute and subacute appearing thrombus in the aspiration canister. Approximately 4 passes were made through the segments. The aspiration catheter was removed and exchanged for a 4 French, 90 cm Navicross catheter which was positioned in the peripheral femoral vein. Left lower extremity venogram was performed which demonstrated persistent filling defects in the femoral vein without significant opacification of the iliac veins. Next, balloon maceration was performed  throughout the thrombosed segment with a 10 mm by 80 mm Athletis balloon in the iliac veins and 8 cm x 40 mm mustang balloon throughout the central aspect of the femoral vein. After angioplasty, repeat venogram from the central femoral vein demonstrated significantly improved patency inflow with persistent mild luminal irregularity and stenosis in the left common iliac vein. Ultrasound evaluation of the left common femoral vein demonstrated a patent central channel with persistent peripheral, subacute appearing thrombus. Subdermal Local anesthesia was administered at the planned needle entry site. A small skin nick was made. Under direct ultrasound visualization, the left common femoral vein was accessed with a 21 gauge micropuncture needle. A micropuncture sheath was inserted in a Bentson wire was directed to the inferior vena cava. The micropuncture sheath was exchanged for a 9 French, 10 cm vascular sheath. 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 Intravascular ultrasound was used for measurement purposes of the vein. Left pelvic venogram was then performed confirming the findings on intravascular ultrasound. Under direct fluoroscopic visualization, a 14 mm x 150 mm operate stent was then deployed. Post deployment balloon venoplasty was then performed with a 14 mm x 4 cm atlas balloon throughout the length of the stent. Completion bilateral pelvic venogram demonstrated patency of the stent with antegrade flow into the inferior vena cava. There were a few non flow limiting luminal filling defects throughout the stented segment. Completion inferior vena cavogram was then performed which demonstrated small focal filling defect within the apex of the implanted filter compatible with  small volume of migrated thrombus. The left common femoral vein sheath was removed and hemostasis was achieved with manual compression. Sterile bandage was applied. The right common femoral sheath was then removed and the 2 Perclose devices were tied which achieve immediate hemostasis. A sterile bandage was applied. The patient  tolerated the procedure well was transferred back to the floor in good condition. IMPRESSION: 1. Acute to subacute left lower extremity deep vein thrombosis extending from the left common iliac veins the calf veins. 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 2. Technically successful placement of inferior vena cava filter. Interventional radiology will follow the patient as an outpatient with plans to evaluate for filter retrieval in approximately 3 months. 3. Technically successful computer-assisted aspiration thrombectomy and balloon venoplasty of the left iliac and femoral veins. 4. Technically successful placement of uncovered, self expanding stent (14 x 150 mm Abre) spanning the left common through left external iliac veins. PLAN: Discontinue heparin infusion in begin therapeutic Lovenox injections. The first dose was administered in Interventional Radiology. Recommend left lower extremity compression hose and SCDs and encourage ambulation. Recommend Lovenox for at least 1 month prior to switching to oral anticoagulation. Interventional radiology will follow all in patient and arrange 1 month follow-up as an outpatient with lower extremity venous duplex and CT venogram abdomen pelvis. Marliss Coots, MD [facility] Radiology Electronically Signed   By: Marliss Coots M.D.   On: 05/20/2023 09:54   IR PTA VENOUS EXCEPT DIALYSIS CIRCUIT  Result Date: 05/20/2023 INDICATION: 67 year old female presenting with acute pulmonary embolism and left  lower extremity swelling with acute appearing thrombus extending from the left common iliac vein in the left calf veins. Concern for May-Thurner morphology on CT. EXAM: 1. Ultrasound-guided vascular access of the right common femoral vein. 2. Inferior vena cavogram. 3. Placement of infrarenal IVC filter. 4. Left lower extremity venogram. 5. Intravascular ultrasound. 6. Aspiration thrombectomy of left iliac and femoral veins. 7. Balloon angioplasty of left iliac and femoral veins. 8. Ultrasound-guided vascular access of the left common femoral vein. 9. Left iliac venous stent placement. COMPARISON:  None Available. MEDICATIONS: Benadryl 50 mg, intravenous ANESTHESIA/SEDATION: Versed 8 mg IV; Fentanyl 300 mcg IV Moderate Sedation Time:  118 minutes The patient was continuously monitored during the procedure by the interventional radiology nurse under my direct supervision. FLUOROSCOPY TIME:  One hundred forty-one mGy COMPLICATIONS: None immediate. TECHNIQUE: Informed written consent was obtained from the patient after a thorough discussion of the procedural risks, benefits and alternatives. All questions were addressed. Maximal Sterile Barrier Technique was utilized including caps, mask, sterile gowns, sterile gloves, sterile drape, hand hygiene and skin antiseptic. A timeout was performed prior to the initiation of the procedure. Preprocedure ultrasound evaluation of the right common femoral vein demonstrated patency and compressibility. Subdermal Local anesthesia was provided at the planned needle entry site. A small skin nick was made. Under direct ultrasound visualization, the right common femoral vein was accessed with a 21 gauge micropuncture needle. A micropuncture sheath was placed and a Bentson wire was directed to the inferior vena cava. An 8 French sheath was then placed over the wire and inferior vena cavagram was performed which demonstrated normal caliber, widely patent inferior vena cava without  vascular anomaly. Over the wire, an Argon option elite filter was introduced and deployed in infrarenal location under fluoroscopic visualization. Post deployment inferior vena cavogram demonstrated proper positioning. Two Perclose devices were then pre deployed at the 10 o'clock and 2 o'clock positions in the right common femoral vein after removal of the 8 [place] sheath. After serial dilation was performed, a 33 cm 16 French dry seal sheath was inserted and positioned near the iliac vein confluence. The left  common iliac vein was then cannulated using an Omni flush followed by a C2 catheter and Glidewire. The catheter was directed to the mid to peripheral left femoral vein. An exchange Rosen wire was then placed in the catheter was removed. Intravascular ultrasound was then performed with a Phillips 0.035 inch IVUS catheter. There was extensive, acute to subacute appearing thrombus throughout the iliac and femoral veins. Of note, there was severe focal compression of the left common iliac vein at the site of overlying right common iliac artery. Next, under intermittent fluoroscopic visualization, the 16 French penumbra flash aspiration catheter was inserted over the wire and aspiration was performed throughout the left common iliac, external iliac, common femoral, and central to mid femoral vein. There was copious acute and subacute appearing thrombus in the aspiration canister. Approximately 4 passes were made through the segments. The aspiration catheter was removed and exchanged for a 4 French, 90 cm Navicross catheter which was positioned in the peripheral femoral vein. Left lower extremity venogram was performed which demonstrated persistent filling defects in the femoral vein without significant opacification of the iliac veins. Next, balloon maceration was performed throughout the thrombosed segment with a 10 mm by 80 mm Athletis balloon in the iliac veins and 8 cm x 40 mm mustang balloon throughout the  central aspect of the femoral vein. After angioplasty, repeat venogram from the central femoral vein demonstrated significantly improved patency inflow with persistent mild luminal irregularity and stenosis in the left common iliac vein. Ultrasound evaluation of the left common femoral vein demonstrated a patent central channel with persistent peripheral, subacute appearing thrombus. Subdermal Local anesthesia was administered at the planned needle entry site. A small skin nick was made. Under direct ultrasound visualization, the left common femoral vein was accessed with a 21 gauge micropuncture needle. A micropuncture sheath was inserted in a Bentson wire was directed to the inferior vena cava. The micropuncture sheath was exchanged for a 9 French, 10 cm vascular sheath. 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 Intravascular ultrasound was used for measurement purposes of the vein. Left pelvic venogram was then performed confirming the findings on intravascular ultrasound. Under direct fluoroscopic visualization, a 14 mm x 150 mm operate stent was then deployed. Post deployment balloon venoplasty was then performed with a 14 mm x 4 cm atlas balloon throughout the length of the stent. Completion bilateral pelvic venogram demonstrated patency of the stent with antegrade flow into the inferior vena cava. There were a few non flow limiting luminal filling defects throughout the stented segment. Completion inferior vena cavogram was then performed which demonstrated small focal filling defect within the apex of the implanted filter compatible with small volume of migrated thrombus. The left common femoral vein sheath was removed and hemostasis was achieved with manual compression.  Sterile bandage was applied. The right common femoral sheath was then removed and the 2 Perclose devices were tied which achieve immediate hemostasis. A sterile bandage was applied. The patient tolerated the procedure well was transferred back to the floor in good condition. IMPRESSION: 1. Acute to subacute left lower extremity deep vein thrombosis extending from the left common iliac veins the calf veins. 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 2.  Technically successful placement of inferior vena cava filter. Interventional radiology will follow the patient as an outpatient with plans to evaluate for filter retrieval in approximately 3 months. 3. Technically successful computer-assisted aspiration thrombectomy and balloon venoplasty of the left iliac and femoral veins. 4. Technically successful placement of uncovered, self expanding stent (14 x 150 mm Abre) spanning the left common through left external iliac veins. PLAN: Discontinue heparin infusion in begin therapeutic Lovenox injections. The first dose was administered in Interventional Radiology. Recommend left lower extremity compression hose and SCDs and encourage ambulation. Recommend Lovenox for at least 1 month prior to switching to oral anticoagulation. Interventional radiology will follow all in patient and arrange 1 month follow-up as an outpatient with lower extremity venous duplex and CT venogram abdomen pelvis. Marliss Coots, MD [facility] Radiology Electronically Signed   By: Marliss Coots M.D.   On: 05/20/2023 09:54   IR US Guide Vasc Access Left  Result Date: 05/20/2023 INDICATION: 67 year old female presenting with acute pulmonary embolism and left lower extremity swelling with acute appearing thrombus extending from the left common iliac vein in the left calf veins. Concern for May-Thurner  morphology on CT. EXAM: 1. Ultrasound-guided vascular access of the right common femoral vein. 2. Inferior vena cavogram. 3. Placement of infrarenal IVC filter. 4. Left lower extremity venogram. 5. Intravascular ultrasound. 6. Aspiration thrombectomy of left iliac and femoral veins. 7. Balloon angioplasty of left iliac and femoral veins. 8. Ultrasound-guided vascular access of the left common femoral vein. 9. Left iliac venous stent placement. COMPARISON:  None Available. MEDICATIONS: Benadryl 50 mg, intravenous ANESTHESIA/SEDATION: Versed 8 mg IV; Fentanyl 300 mcg IV Moderate Sedation Time:  118 minutes The patient was continuously monitored during the procedure by the interventional radiology nurse under my direct supervision. FLUOROSCOPY TIME:  One hundred forty-one mGy COMPLICATIONS: None immediate. TECHNIQUE: Informed written consent was obtained from the patient after a thorough discussion of the procedural risks, benefits and alternatives. All questions were addressed. Maximal Sterile Barrier Technique was utilized including caps, mask, sterile gowns, sterile gloves, sterile drape, hand hygiene and skin antiseptic. A timeout was performed prior to the initiation of the procedure. Preprocedure ultrasound evaluation of the right common femoral vein demonstrated patency and compressibility. Subdermal Local anesthesia was provided at the planned needle entry site. A small skin nick was made. Under direct ultrasound visualization, the right common femoral vein was accessed with a 21 gauge micropuncture needle. A micropuncture sheath was placed and a Bentson wire was directed to the inferior vena cava. An 8 French sheath was then placed over the wire and inferior vena cavagram was performed which demonstrated normal caliber, widely patent inferior vena cava without vascular anomaly. Over the wire, an Argon option elite filter was introduced and deployed in infrarenal location under fluoroscopic visualization. Post  deployment inferior vena cavogram demonstrated proper positioning. Two Perclose devices were then pre deployed at the 10 o'clock and 2 o'clock positions in the right common femoral vein after removal of the 8 [place] sheath. After serial dilation was performed, a 33 cm 16 French dry seal sheath was inserted and positioned near the iliac vein confluence. The left common iliac vein was then cannulated using an Omni flush followed by a C2 catheter and Glidewire. The catheter was directed to the mid to peripheral left femoral vein. An exchange Rosen wire was then placed in the catheter was removed. Intravascular ultrasound was then performed with a Phillips 0.035 inch IVUS catheter. There was extensive,  acute to subacute appearing thrombus throughout the iliac and femoral veins. Of note, there was severe focal compression of the left common iliac vein at the site of overlying right common iliac artery. Next, under intermittent fluoroscopic visualization, the 16 French penumbra flash aspiration catheter was inserted over the wire and aspiration was performed throughout the left common iliac, external iliac, common femoral, and central to mid femoral vein. There was copious acute and subacute appearing thrombus in the aspiration canister. Approximately 4 passes were made through the segments. The aspiration catheter was removed and exchanged for a 4 French, 90 cm Navicross catheter which was positioned in the peripheral femoral vein. Left lower extremity venogram was performed which demonstrated persistent filling defects in the femoral vein without significant opacification of the iliac veins. Next, balloon maceration was performed throughout the thrombosed segment with a 10 mm by 80 mm Athletis balloon in the iliac veins and 8 cm x 40 mm mustang balloon throughout the central aspect of the femoral vein. After angioplasty, repeat venogram from the central femoral vein demonstrated significantly improved patency inflow  with persistent mild luminal irregularity and stenosis in the left common iliac vein. Ultrasound evaluation of the left common femoral vein demonstrated a patent central channel with persistent peripheral, subacute appearing thrombus. Subdermal Local anesthesia was administered at the planned needle entry site. A small skin nick was made. Under direct ultrasound visualization, the left common femoral vein was accessed with a 21 gauge micropuncture needle. A micropuncture sheath was inserted in a Bentson wire was directed to the inferior vena cava. The micropuncture sheath was exchanged for a 9 French, 10 cm vascular sheath. 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 Intravascular ultrasound was used for measurement purposes of the vein. Left pelvic venogram was then performed confirming the findings on intravascular ultrasound. Under direct fluoroscopic visualization, a 14 mm x 150 mm operate stent was then deployed. Post deployment balloon venoplasty was then performed with a 14 mm x 4 cm atlas balloon throughout the length of the stent. Completion bilateral pelvic venogram demonstrated patency of the stent with antegrade flow into the inferior vena cava. There were a few non flow limiting luminal filling defects throughout the stented segment. Completion inferior vena cavogram was then performed which demonstrated small focal filling defect within the apex of the implanted filter compatible with small volume of migrated thrombus. The left common femoral vein sheath was removed and hemostasis was achieved with manual compression. Sterile bandage was applied. The right common femoral sheath was then removed and the 2 Perclose devices were tied which achieve immediate hemostasis. A  sterile bandage was applied. The patient tolerated the procedure well was transferred back to the floor in good condition. IMPRESSION: 1. Acute to subacute left lower extremity deep vein thrombosis extending from the left common iliac veins the calf veins. 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 2. Technically successful placement of inferior vena cava filter. Interventional radiology will follow the patient as an outpatient with plans to evaluate for filter retrieval in approximately 3 months. 3. Technically successful computer-assisted aspiration thrombectomy and balloon venoplasty of the left iliac and femoral veins. 4. Technically successful placement of uncovered, self expanding stent (14 x 150  mm Abre) spanning the left common through left external iliac veins. PLAN: Discontinue heparin infusion in begin therapeutic Lovenox injections. The first dose was administered in Interventional Radiology. Recommend left lower extremity compression hose and SCDs and encourage ambulation. Recommend Lovenox for at least 1 month prior to switching to oral anticoagulation. Interventional radiology will follow all in patient and arrange 1 month follow-up as an outpatient with lower extremity venous duplex and CT venogram abdomen pelvis. Marliss Coots, MD [facility] Radiology Electronically Signed   By: Marliss Coots M.D.   On: 05/20/2023 09:54   IR Venocavagram Ivc  Result Date: 05/20/2023 INDICATION: 67 year old female presenting with acute pulmonary embolism and left lower extremity swelling with acute appearing thrombus extending from the left common iliac vein in the left calf veins. Concern for May-Thurner morphology on CT. EXAM: 1. Ultrasound-guided vascular access of the right common femoral vein. 2. Inferior vena cavogram. 3. Placement of infrarenal IVC filter. 4.  Left lower extremity venogram. 5. Intravascular ultrasound. 6. Aspiration thrombectomy of left iliac and femoral veins. 7. Balloon angioplasty of left iliac and femoral veins. 8. Ultrasound-guided vascular access of the left common femoral vein. 9. Left iliac venous stent placement. COMPARISON:  None Available. MEDICATIONS: Benadryl 50 mg, intravenous ANESTHESIA/SEDATION: Versed 8 mg IV; Fentanyl 300 mcg IV Moderate Sedation Time:  118 minutes The patient was continuously monitored during the procedure by the interventional radiology nurse under my direct supervision. FLUOROSCOPY TIME:  One hundred forty-one mGy COMPLICATIONS: None immediate. TECHNIQUE: Informed written consent was obtained from the patient after a thorough discussion of the procedural risks, benefits and alternatives. All questions were addressed. Maximal Sterile Barrier Technique was utilized including caps, mask, sterile gowns, sterile gloves, sterile drape, hand hygiene and skin antiseptic. A timeout was performed prior to the initiation of the procedure. Preprocedure ultrasound evaluation of the right common femoral vein demonstrated patency and compressibility. Subdermal Local anesthesia was provided at the planned needle entry site. A small skin nick was made. Under direct ultrasound visualization, the right common femoral vein was accessed with a 21 gauge micropuncture needle. A micropuncture sheath was placed and a Bentson wire was directed to the inferior vena cava. An 8 French sheath was then placed over the wire and inferior vena cavagram was performed which demonstrated normal caliber, widely patent inferior vena cava without vascular anomaly. Over the wire, an Argon option elite filter was introduced and deployed in infrarenal location under fluoroscopic visualization. Post deployment inferior vena cavogram demonstrated proper positioning. Two Perclose devices were then pre deployed at the 10 o'clock and 2 o'clock positions in the  right common femoral vein after removal of the 8 [place] sheath. After serial dilation was performed, a 33 cm 16 French dry seal sheath was inserted and positioned near the iliac vein confluence. The left common iliac vein was then cannulated using an Omni flush followed by a C2 catheter and Glidewire. The catheter was directed to the mid to peripheral left femoral vein. An exchange Rosen wire was then placed in the catheter was removed. Intravascular ultrasound was then performed with a Phillips 0.035 inch IVUS catheter. There was extensive, acute to subacute appearing thrombus throughout the iliac and femoral veins. Of note, there was severe focal compression of the left common iliac vein at the site of overlying right common iliac artery. Next, under intermittent fluoroscopic visualization, the 16 French penumbra flash aspiration catheter was inserted over the wire and aspiration was performed throughout the left common iliac,  external iliac, common femoral, and central to mid femoral vein. There was copious acute and subacute appearing thrombus in the aspiration canister. Approximately 4 passes were made through the segments. The aspiration catheter was removed and exchanged for a 4 French, 90 cm Navicross catheter which was positioned in the peripheral femoral vein. Left lower extremity venogram was performed which demonstrated persistent filling defects in the femoral vein without significant opacification of the iliac veins. Next, balloon maceration was performed throughout the thrombosed segment with a 10 mm by 80 mm Athletis balloon in the iliac veins and 8 cm x 40 mm mustang balloon throughout the central aspect of the femoral vein. After angioplasty, repeat venogram from the central femoral vein demonstrated significantly improved patency inflow with persistent mild luminal irregularity and stenosis in the left common iliac vein. Ultrasound evaluation of the left common femoral vein demonstrated a patent  central channel with persistent peripheral, subacute appearing thrombus. Subdermal Local anesthesia was administered at the planned needle entry site. A small skin nick was made. Under direct ultrasound visualization, the left common femoral vein was accessed with a 21 gauge micropuncture needle. A micropuncture sheath was inserted in a Bentson wire was directed to the inferior vena cava. The micropuncture sheath was exchanged for a 9 French, 10 cm vascular sheath. 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 Intravascular ultrasound was used for measurement purposes of the vein. Left pelvic venogram was then performed confirming the findings on intravascular ultrasound. Under direct fluoroscopic visualization, a 14 mm x 150 mm operate stent was then deployed. Post deployment balloon venoplasty was then performed with a 14 mm x 4 cm atlas balloon throughout the length of the stent. Completion bilateral pelvic venogram demonstrated patency of the stent with antegrade flow into the inferior vena cava. There were a few non flow limiting luminal filling defects throughout the stented segment. Completion inferior vena cavogram was then performed which demonstrated small focal filling defect within the apex of the implanted filter compatible with small volume of migrated thrombus. The left common femoral vein sheath was removed and hemostasis was achieved with manual compression. Sterile bandage was applied. The right common femoral sheath was then removed and the 2 Perclose devices were tied which achieve immediate hemostasis. A sterile bandage was applied. The patient tolerated the procedure well was transferred back to the floor in good condition. IMPRESSION: 1. Acute to subacute left lower  extremity deep vein thrombosis extending from the left common iliac veins the calf veins. 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 2. Technically successful placement of inferior vena cava filter. Interventional radiology will follow the patient as an outpatient with plans to evaluate for filter retrieval in approximately 3 months. 3. Technically successful computer-assisted aspiration thrombectomy and balloon venoplasty of the left iliac and femoral veins. 4. Technically successful placement of uncovered, self expanding stent (14 x 150 mm Abre) spanning the left common through left external iliac veins. PLAN: Discontinue heparin infusion in begin therapeutic Lovenox injections. The first dose was administered in Interventional Radiology. Recommend left lower extremity compression hose and SCDs and encourage ambulation. Recommend Lovenox for at least 1 month prior to switching to oral anticoagulation. Interventional radiology will follow all in patient  and arrange 1 month follow-up as an outpatient with lower extremity venous duplex and CT venogram abdomen pelvis. Marliss Coots, MD [facility] Radiology Electronically Signed   By: Marliss Coots M.D.   On: 05/20/2023 09:54   IR Veno/Ext/Uni Left  Result Date: 05/20/2023 INDICATION: 67 year old female presenting with acute pulmonary embolism and left lower extremity swelling with acute appearing thrombus extending from the left common iliac vein in the left calf veins. Concern for May-Thurner morphology on CT. EXAM: 1. Ultrasound-guided vascular access of the right common femoral vein. 2. Inferior vena cavogram. 3. Placement of infrarenal IVC filter. 4. Left lower extremity venogram. 5. Intravascular ultrasound. 6. Aspiration thrombectomy of left iliac and femoral veins. 7. Balloon angioplasty of left iliac and  femoral veins. 8. Ultrasound-guided vascular access of the left common femoral vein. 9. Left iliac venous stent placement. COMPARISON:  None Available. MEDICATIONS: Benadryl 50 mg, intravenous ANESTHESIA/SEDATION: Versed 8 mg IV; Fentanyl 300 mcg IV Moderate Sedation Time:  118 minutes The patient was continuously monitored during the procedure by the interventional radiology nurse under my direct supervision. FLUOROSCOPY TIME:  One hundred forty-one mGy COMPLICATIONS: None immediate. TECHNIQUE: Informed written consent was obtained from the patient after a thorough discussion of the procedural risks, benefits and alternatives. All questions were addressed. Maximal Sterile Barrier Technique was utilized including caps, mask, sterile gowns, sterile gloves, sterile drape, hand hygiene and skin antiseptic. A timeout was performed prior to the initiation of the procedure. Preprocedure ultrasound evaluation of the right common femoral vein demonstrated patency and compressibility. Subdermal Local anesthesia was provided at the planned needle entry site. A small skin nick was made. Under direct ultrasound visualization, the right common femoral vein was accessed with a 21 gauge micropuncture needle. A micropuncture sheath was placed and a Bentson wire was directed to the inferior vena cava. An 8 French sheath was then placed over the wire and inferior vena cavagram was performed which demonstrated normal caliber, widely patent inferior vena cava without vascular anomaly. Over the wire, an Argon option elite filter was introduced and deployed in infrarenal location under fluoroscopic visualization. Post deployment inferior vena cavogram demonstrated proper positioning. Two Perclose devices were then pre deployed at the 10 o'clock and 2 o'clock positions in the right common femoral vein after removal of the 8 [place] sheath. After serial dilation was performed, a 33 cm 16 French dry seal sheath was inserted and positioned  near the iliac vein confluence. The left common iliac vein was then cannulated using an Omni flush followed by a C2 catheter and Glidewire. The catheter was directed to the mid to peripheral left femoral vein. An exchange Rosen wire was then placed in the catheter was removed. Intravascular ultrasound was then performed with a Phillips 0.035 inch IVUS catheter. There was extensive, acute to subacute appearing thrombus throughout the iliac and femoral veins. Of note, there was severe focal compression of the left common iliac vein at the site of overlying right common iliac artery. Next, under intermittent fluoroscopic visualization, the 16 French penumbra flash aspiration catheter was inserted over the wire and aspiration was performed throughout the left common iliac, external iliac, common femoral, and central to mid femoral vein. There was copious acute and subacute appearing thrombus in the aspiration canister. Approximately 4 passes were made through the segments. The aspiration catheter was removed and exchanged for a 4 French, 90 cm Navicross catheter which was positioned in the peripheral femoral vein. Left lower extremity venogram was performed  which demonstrated persistent filling defects in the femoral vein without significant opacification of the iliac veins. Next, balloon maceration was performed throughout the thrombosed segment with a 10 mm by 80 mm Athletis balloon in the iliac veins and 8 cm x 40 mm mustang balloon throughout the central aspect of the femoral vein. After angioplasty, repeat venogram from the central femoral vein demonstrated significantly improved patency inflow with persistent mild luminal irregularity and stenosis in the left common iliac vein. Ultrasound evaluation of the left common femoral vein demonstrated a patent central channel with persistent peripheral, subacute appearing thrombus. Subdermal Local anesthesia was administered at the planned needle entry site. A small skin  nick was made. Under direct ultrasound visualization, the left common femoral vein was accessed with a 21 gauge micropuncture needle. A micropuncture sheath was inserted in a Bentson wire was directed to the inferior vena cava. The micropuncture sheath was exchanged for a 9 French, 10 cm vascular sheath. 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 Intravascular ultrasound was used for measurement purposes of the vein. Left pelvic venogram was then performed confirming the findings on intravascular ultrasound. Under direct fluoroscopic visualization, a 14 mm x 150 mm operate stent was then deployed. Post deployment balloon venoplasty was then performed with a 14 mm x 4 cm atlas balloon throughout the length of the stent. Completion bilateral pelvic venogram demonstrated patency of the stent with antegrade flow into the inferior vena cava. There were a few non flow limiting luminal filling defects throughout the stented segment. Completion inferior vena cavogram was then performed which demonstrated small focal filling defect within the apex of the implanted filter compatible with small volume of migrated thrombus. The left common femoral vein sheath was removed and hemostasis was achieved with manual compression. Sterile bandage was applied. The right common femoral sheath was then removed and the 2 Perclose devices were tied which achieve immediate hemostasis. A sterile bandage was applied. The patient tolerated the procedure well was transferred back to the floor in good condition. IMPRESSION: 1. Acute to subacute left lower extremity deep vein thrombosis extending from the left common iliac veins the calf veins. 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 2. Technically successful placement of inferior vena cava filter. Interventional radiology will follow the patient as an outpatient with plans to evaluate for filter retrieval in approximately 3 months. 3. Technically successful computer-assisted aspiration thrombectomy and balloon venoplasty of the left iliac and femoral veins. 4. Technically successful placement of uncovered, self expanding stent (14 x 150 mm Abre) spanning the left common through left external iliac veins. PLAN: Discontinue heparin infusion in begin therapeutic Lovenox injections. The first dose was administered in Interventional Radiology. Recommend left lower extremity compression hose and SCDs and encourage ambulation. Recommend Lovenox for at least 1 month prior to switching to oral anticoagulation. Interventional radiology will follow all in patient and arrange 1 month follow-up as an outpatient with lower extremity venous duplex and CT venogram abdomen pelvis. Marliss Coots, MD [facility] Inter[facility] Radiology Specialists [facility] Radiology Electronically Signed   By: Marliss Coots M.D.   On: 05/20/2023 09:54   IR TRANSCATH PLC STENT 1ST ART NOT LE CV CAR VERT CAR  Result Date:  05/20/2023 INDICATION: 67 year old female presenting with acute pulmonary embolism and left lower extremity swelling with acute appearing thrombus extending from the left common iliac vein in the left calf veins. Concern for May-Thurner morphology on CT. EXAM: 1. Ultrasound-guided vascular access of the right common femoral vein. 2. Inferior vena cavogram. 3. Placement of infrarenal IVC filter. 4. Left lower extremity venogram. 5. Intravascular ultrasound. 6. Aspiration thrombectomy of left iliac and femoral veins. 7. Balloon angioplasty of left iliac and femoral veins. 8. Ultrasound-guided vascular access of the left common femoral vein. 9. Left iliac venous stent placement.  COMPARISON:  None Available. MEDICATIONS: Benadryl 50 mg, intravenous ANESTHESIA/SEDATION: Versed 8 mg IV; Fentanyl 300 mcg IV Moderate Sedation Time:  118 minutes The patient was continuously monitored during the procedure by the interventional radiology nurse under my direct supervision. FLUOROSCOPY TIME:  One hundred forty-one mGy COMPLICATIONS: None immediate. TECHNIQUE: Informed written consent was obtained from the patient after a thorough discussion of the procedural risks, benefits and alternatives. All questions were addressed. Maximal Sterile Barrier Technique was utilized including caps, mask, sterile gowns, sterile gloves, sterile drape, hand hygiene and skin antiseptic. A timeout was performed prior to the initiation of the procedure. Preprocedure ultrasound evaluation of the right common femoral vein demonstrated patency and compressibility. Subdermal Local anesthesia was provided at the planned needle entry site. A small skin nick was made. Under direct ultrasound visualization, the right common femoral vein was accessed with a 21 gauge micropuncture needle. A micropuncture sheath was placed and a Bentson wire was directed to the inferior vena cava. An 8 French sheath was then placed over the wire and inferior vena cavagram was performed which demonstrated normal caliber, widely patent inferior vena cava without vascular anomaly. Over the wire, an Argon option elite filter was introduced and deployed in infrarenal location under fluoroscopic visualization. Post deployment inferior vena cavogram demonstrated proper positioning. Two Perclose devices were then pre deployed at the 10 o'clock and 2 o'clock positions in the right common femoral vein after removal of the 8 [place] sheath. After serial dilation was performed, a 33 cm 16 French dry seal sheath was inserted and positioned near the iliac vein confluence. The left common iliac vein was then cannulated using an Omni flush followed by a C2 catheter  and Glidewire. The catheter was directed to the mid to peripheral left femoral vein. An exchange Rosen wire was then placed in the catheter was removed. Intravascular ultrasound was then performed with a Phillips 0.035 inch IVUS catheter. There was extensive, acute to subacute appearing thrombus throughout the iliac and femoral veins. Of note, there was severe focal compression of the left common iliac vein at the site of overlying right common iliac artery. Next, under intermittent fluoroscopic visualization, the 16 French penumbra flash aspiration catheter was inserted over the wire and aspiration was performed throughout the left common iliac, external iliac, common femoral, and central to mid femoral vein. There was copious acute and subacute appearing thrombus in the aspiration canister. Approximately 4 passes were made through the segments. The aspiration catheter was removed and exchanged for a 4 French, 90 cm Navicross catheter which was positioned in the peripheral femoral vein. Left lower extremity venogram was performed which demonstrated persistent filling defects in the femoral vein without significant opacification of the iliac veins. Next, balloon maceration was performed throughout the thrombosed segment with a 10 mm by 80 mm Athletis balloon in the iliac veins and 8 cm x 40 mm mustang balloon throughout the central aspect  of the femoral vein. After angioplasty, repeat venogram from the central femoral vein demonstrated significantly improved patency inflow with persistent mild luminal irregularity and stenosis in the left common iliac vein. Ultrasound evaluation of the left common femoral vein demonstrated a patent central channel with persistent peripheral, subacute appearing thrombus. Subdermal Local anesthesia was administered at the planned needle entry site. A small skin nick was made. Under direct ultrasound visualization, the left common femoral vein was accessed with a 21 gauge micropuncture  needle. A micropuncture sheath was inserted in a Bentson wire was directed to the inferior vena cava. The micropuncture sheath was exchanged for a 9 French, 10 cm vascular sheath. 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 Intravascular ultrasound was used for measurement purposes of the vein. Left pelvic venogram was then performed confirming the findings on intravascular ultrasound. Under direct fluoroscopic visualization, a 14 mm x 150 mm operate stent was then deployed. Post deployment balloon venoplasty was then performed with a 14 mm x 4 cm atlas balloon throughout the length of the stent. Completion bilateral pelvic venogram demonstrated patency of the stent with antegrade flow into the inferior vena cava. There were a few non flow limiting luminal filling defects throughout the stented segment. Completion inferior vena cavogram was then performed which demonstrated small focal filling defect within the apex of the implanted filter compatible with small volume of migrated thrombus. The left common femoral vein sheath was removed and hemostasis was achieved with manual compression. Sterile bandage was applied. The right common femoral sheath was then removed and the 2 Perclose devices were tied which achieve immediate hemostasis. A sterile bandage was applied. The patient tolerated the procedure well was transferred back to the floor in good condition. IMPRESSION: 1. Acute to subacute left lower extremity deep vein thrombosis extending from the left common iliac veins the calf veins. 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 2. Technically successful placement of inferior vena  cava filter. Interventional radiology will follow the patient as an outpatient with plans to evaluate for filter retrieval in approximately 3 months. 3. Technically successful computer-assisted aspiration thrombectomy and balloon venoplasty of the left iliac and femoral veins. 4. Technically successful placement of uncovered, self expanding stent (14 x 150 mm Abre) spanning the left common through left external iliac veins. PLAN: Discontinue heparin infusion in begin therapeutic Lovenox injections. The first dose was administered in Interventional Radiology. Recommend left lower extremity compression hose and SCDs and encourage ambulation. Recommend Lovenox for at least 1 month prior to switching to oral anticoagulation. Interventional radiology will follow all in patient and arrange 1 month follow-up as an outpatient with lower extremity venous duplex and CT venogram abdomen pelvis. Marliss Coots, MD Vascular and Interventional Radiology Specialists [facility] Radiology Electronically Signed   By: Marliss Coots M.D.   On: 05/20/2023 09:54   VAS [place] LOWER EXTREMITY VENOUS (DVT)  Result Date: 05/19/2023  Lower Venous DVT Study Patient Name:  MARIETOU HARPSTER  Date of Exam:   05/17/2023 Medical Rec #: [id number]      Accession #:    [id number] Date of Birth: [birth year]       Patient Gender: F Patient Age:   21 years Exam Location:  [facility] Procedure:      VAS [place] LOWER EXTREMITY VENOUS (DVT) Referring Phys: SYLVESTER OGBATA --------------------------------------------------------------------------------  Indications: Progressive swelling.  Risk Factors: DVT 05/15/2023 Acute Right popliteal, PT, Peroneal, and soleal veins and left external iliac, common femoral, SF junction, femoral, profunda, popliteal, PT, peroneal, and gastroc veins. Limitations: Bowel gas and body habitus. Comparison Study: Prior study done 05/15/2023 Performing Technologist: Sherren Kerns RVS  Examination Guidelines: A complete evaluation  includes B-mode imaging, spectral Doppler, color Doppler, and power Doppler as needed of all accessible portions of each vessel. Bilateral testing is considered an integral part of a complete examination. Limited examinations for reoccurring indications may be performed as noted. The reflux portion of the exam is performed with the patient in reverse Trendelenburg.  +-----+---------------+---------+-----------+----------+--------------+ RIGHTCompressibilityPhasicitySpontaneityPropertiesThrombus Aging +-----+---------------+---------+-----------+----------+--------------+ CFV  Full           Yes      Yes                                 +-----+---------------+---------+-----------+----------+--------------+   +---------+---------------+---------+-----------+----------+--------------+ LEFT     CompressibilityPhasicitySpontaneityPropertiesThrombus Aging +---------+---------------+---------+-----------+----------+--------------+ CFV      None           No       No                   Acute          +---------+---------------+---------+-----------+----------+--------------+ SFJ      None           No       No                   Acute          +---------+---------------+---------+-----------+----------+--------------+ FV Prox  None           No       No                   Acute          +---------+---------------+---------+-----------+----------+--------------+ FV Mid   None           No       No                   Acute          +---------+---------------+---------+-----------+----------+--------------+ FV DistalNone           No       No                   Acute          +---------+---------------+---------+-----------+----------+--------------+ PFV      None           No       No                   Acute          +---------+---------------+---------+-----------+----------+--------------+ POP      None           No       No                   Acute           +---------+---------------+---------+-----------+----------+--------------+ PTV      None  Acute          +---------+---------------+---------+-----------+----------+--------------+ PERO     None                                         Acute          +---------+---------------+---------+-----------+----------+--------------+ Gastroc  None           No       No                   Acute          +---------+---------------+---------+-----------+----------+--------------+ EIV      None                                         Acute          +---------+---------------+---------+-----------+----------+--------------+ CIV                                                   not visualized +---------+---------------+---------+-----------+----------+--------------+     Summary: RIGHT: - Findings appear essentially unchanged compared to previous examination. - There is no evidence of deep vein thrombosis in the lower extremity.  LEFT: - Findings consistent with acute deep vein thrombosis involving the left common femoral vein, SF junction, left femoral vein, left proximal profunda vein, left popliteal vein, left posterior tibial veins, left peroneal veins, left gastrocnemius veins, and EIV. - Findings appear essentially unchanged compared to previous examination.  *See table(s) above for measurements and observations. Electronically signed by Coral Else MD on 05/19/2023 at 9:08:01 PM.    Final    CT VENOGRAM ABD/PEL  Result Date: 05/18/2023 CLINICAL DATA:  Bilateral pulmonary artery embolism.  DVT suspected. EXAM: CT VENOGRAM ABDOMEN AND PELVIS TECHNIQUE: Venographic phase images of the abdomen and pelvis were obtained following the administration of intravenous contrast. Multiplanar reformats and maximum intensity projections were generated. RADIATION DOSE REDUCTION: This exam was performed according to the departmental dose-optimization program which  includes automated exposure control, adjustment of the mA and/or kV according to patient size and/or use of iterative reconstruction technique. CONTRAST:  75mL OMNIPAQUE IOHEXOL 350 MG/ML SOLN COMPARISON:  None CT of the abdomen pelvis dated 02/14/2020. FINDINGS: Lower chest: Small bilateral pleural effusions with partial compressive atelectasis of the lower lobes. Pneumonia is not excluded clinical correlation is recommended. Bilateral lower lobe pulmonary artery emboli are partially visualized. No intra-abdominal free air. Small free fluid within the pelvis. Hepatobiliary: The liver is unremarkable. No biliary ductal dilatation. The gallbladder is unremarkable. Pancreas: Unremarkable. No pancreatic ductal dilatation or surrounding inflammatory changes. Spleen: Normal in size without focal abnormality. Adrenals/Urinary Tract: The adrenal glands unremarkable. Small right renal lower pole cysts. There is no hydronephrosis on either side. There is symmetric enhancement and excretion of contrast by both kidneys. The visualized ureters and urinary bladder appear unremarkable. Stomach/Bowel: Postsurgical changes of the sigmoid colon with anastomotic staple line. There is moderate stool throughout the colon. There is no bowel obstruction or active inflammation. The appendix is normal. Vascular/Lymphatic: Mild atherosclerotic calcification of the abdominal aorta. The IVC is unremarkable. Thrombus extends from the visualized left common femoral vein to the junction of the  left common iliac vein and IVC. The SMV, splenic vein, and main portal vein are patent. No portal venous gas. There is no adenopathy. Reproductive: The uterus is anteverted and grossly unremarkable. No adnexal masses. Other: None Musculoskeletal: No acute or significant osseous findings. IMPRESSION: 1. Left lower extremity DVT extends from the visualized left common femoral vein to the junction of the left common iliac vein and IVC. 2. Partially visualized  bilateral lower lobe pulmonary artery emboli. 3. Small bilateral pleural effusions with partial compressive atelectasis of the lower lobes. 4. No bowel obstruction. Normal appendix. 5.  Aortic Atherosclerosis (ICD10-I70.0). These results will be called to the ordering clinician or representative by the Radiologist Assistant, and communication documented in the PACS or [employer]. Electronically Signed   By: Elgie Collard M.D.   On: 05/18/2023 17:45    Labs:  CBC: Recent Labs    05/17/23 0348 05/18/23 0202 05/19/23 0119 05/20/23 0101  WBC 6.7 6.1 6.6 7.1  HGB 8.7* 9.4* 10.0* 9.0*  HCT 26.6* 28.0* 30.0* 26.7*  PLT 223 273 285 297    COAGS: Recent Labs    05/14/23 1930 05/15/23 0146  INR 1.1 1.1  APTT 32 65*    BMP: Recent Labs    05/14/23 1749 05/15/23 0146 05/18/23 1129 05/20/23 1245  NA 130* 133* 141 139  K 4.5 3.3* 3.6 3.4*  CL 100 103 112* 107  CO2 21* 18* 18* 20*  GLUCOSE 407* 285* 124* 211*  BUN 33* 25* 9 7*  CALCIUM 10.4* 9.6 9.4 9.5  CREATININE 0.69 0.68 0.59 0.66  GFRNONAA >60 >60 >60 >60    LIVER FUNCTION TESTS: Recent Labs    01/20/23 0830 05/14/23 1749 05/15/23 0146 05/18/23 1129 05/20/23 1245  BILITOT 0.6 1.5* 1.3*  --   --   AST 22 26 21   --   --   ALT 42* 42 39  --   --   ALKPHOS 80 74 74  --   --   PROT 6.9 6.5 5.9*  --   --   ALBUMIN 4.3 3.7 3.0* 2.6* 2.6*    Assessment and Plan:  Post LLE thrombectomy IVC filter placement Left iliofemoral extremity aspiration thrombectomy Balloon angioplasty of left iliofemoral veins Left common to external iliac vein stent placement Doing well  Recommend Lovenox for at least 1 month prior to switching to oral anticoagulation.  Interventional radiology will follow all in patient and arrange 1 month follow-up as an outpatient with lower extremity venous duplex and CT venogram abdomen pelvis.   Electronically Signed:  Robet Leu, PA-C 05/20/2023, 2:31 PM   I spent a total of 15  Minutes at the the patient's bedside AND on the patient's hospital floor or unit, greater than 50% of which was counseling/coordinating care for LLE venous thrombectomy

## 2023-05-21 DIAGNOSIS — I2601 Septic pulmonary embolism with acute cor pulmonale: Secondary | ICD-10-CM | POA: Diagnosis not present

## 2023-05-21 LAB — CBC WITH DIFFERENTIAL/PLATELET
Abs Immature Granulocytes: 0 10*3/uL (ref 0.00–0.07)
Basophils Absolute: 0 10*3/uL (ref 0.0–0.1)
Basophils Relative: 0 %
Eosinophils Absolute: 0.1 10*3/uL (ref 0.0–0.5)
Eosinophils Relative: 1 %
HCT: 25.5 % — ABNORMAL LOW (ref 36.0–46.0)
Hemoglobin: 8.7 g/dL — ABNORMAL LOW (ref 12.0–15.0)
Lymphocytes Relative: 14 %
Lymphs Abs: 0.9 10*3/uL (ref 0.7–4.0)
MCH: 32.7 pg (ref 26.0–34.0)
MCHC: 34.1 g/dL (ref 30.0–36.0)
MCV: 95.9 fL (ref 80.0–100.0)
Monocytes Absolute: 0.1 10*3/uL (ref 0.1–1.0)
Monocytes Relative: 2 %
Neutro Abs: 5.1 10*3/uL (ref 1.7–7.7)
Neutrophils Relative %: 83 %
Platelets: 265 10*3/uL (ref 150–400)
RBC: 2.66 MIL/uL — ABNORMAL LOW (ref 3.87–5.11)
RDW: 15.6 % — ABNORMAL HIGH (ref 11.5–15.5)
WBC: 6.1 10*3/uL (ref 4.0–10.5)
nRBC: 1.5 % — ABNORMAL HIGH (ref 0.0–0.2)
nRBC: 2 /100{WBCs} — ABNORMAL HIGH

## 2023-05-21 LAB — GLUCOSE, CAPILLARY
Glucose-Capillary: 107 mg/dL — ABNORMAL HIGH (ref 70–99)
Glucose-Capillary: 252 mg/dL — ABNORMAL HIGH (ref 70–99)
Glucose-Capillary: 215 mg/dL — ABNORMAL HIGH (ref 70–99)
Glucose-Capillary: 162 mg/dL — ABNORMAL HIGH (ref 70–99)

## 2023-05-21 LAB — MAGNESIUM: Magnesium: 2 mg/dL (ref 1.7–2.4)

## 2023-05-21 LAB — RENAL FUNCTION PANEL
Albumin: 2.4 g/dL — ABNORMAL LOW (ref 3.5–5.0)
Anion gap: 8 (ref 5–15)
BUN: 7 mg/dL — ABNORMAL LOW (ref 8–23)
CO2: 20 mmol/L — ABNORMAL LOW (ref 22–32)
Calcium: 9.1 mg/dL (ref 8.9–10.3)
Chloride: 110 mmol/L (ref 98–111)
Creatinine, Ser: 0.51 mg/dL (ref 0.44–1.00)
GFR, Estimated: >60 mL/min (ref >60)
Glucose, Bld: 112 mg/dL — ABNORMAL HIGH (ref 70–99)
Phosphorus: 3 mg/dL (ref 2.5–4.6)
Potassium: 2.8 mmol/L — ABNORMAL LOW (ref 3.5–5.1)
Sodium: 138 mmol/L (ref 135–145)

## 2023-05-21 LAB — ANTITHROMBIN III: AntiThromb III Func: 88 % (ref 75–120)

## 2023-05-21 MED ORDER — POTASSIUM CHLORIDE CRYS ER 20 MEQ PO TBCR
40.0000 meq | EXTENDED_RELEASE_TABLET | ORAL | Status: AC
  Administered 2023-05-21 (×2): 40 meq via ORAL
  Filled 2023-05-21 (×2): qty 2

## 2023-05-21 NOTE — Progress Notes (Signed)
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  Patient has been on prednisone on methotrexate.  Patient was admitted with acute pulmonary embolism and bilateral lower extremity DVT.  History of travel to [place] reported.  Patient is currently on heparin.  05/21/2023: Patient seen alongside patient's husband friend.  Patient continues to improve.  Will consult PT.  Likely discharge tomorrow.  No shortness of breath or chest pain.  Patient will follow-up with hematology and cardiology team on discharge.  Assessment & Plan:   Principal Problem:   Acute pulmonary embolism (HCC) Active Problems:   Primary hypertension   Mucous membrane pemphigoid   Pulmonary embolism (HCC)   Hyperglycemia   Hypercalcemia   Acute pulmonary embolism (HCC) -Continue subcutaneous Lovenox.   -Transition to Eliquis orally tomorrow.   -Patient remains on room air.   -Extensive left lower extremity DVT status post thrombectomy.   -Likely discharge tomorrow. Consult physical therapy.      CTA chest revealed: 1. Positive for acute bilateral PE with CT evidence of right heart strain (RV/LV Ratio = 1.05) consistent with at least submassive (intermediate risk) PE. The presence of right heart strain has been associated with an increased risk of morbidity and mortality. Please refer to the "Code PE Focused" order set in EPIC. 2. Wedge-shaped airspace consolidation within the right middle lobe with additional smaller areas of airspace consolidation within the periphery of the right upper lobe and superior aspect of the right lower lobe. Findings are most concerning for pulmonary infarcts. Multifocal pneumonia could also result in this  appearance in the appropriate clinical setting.   Critical Value/emergent results were called by telephone at the time of interpretation on 05/14/2023 at 7:10 pm to provider Dr. Tanda Rockers , who verbally acknowledged these results.     Electronically Signed   By: Duanne Guess D.O.   On: 05/14/2023 19:10   Hypercalcemia 05/15/2023: Resolved. Suspect this is related to volume status.   Hyperglycemia This is likely been precipitated by patient being started on steroid therapy recently.  See below.  I will maintain the patient on insulin sliding scale at this time. 05/15/2023: Add Semglee.  A1c 7.6%.  Consult diabetic educator.  Patient has been on steroids. 05/20/2023: Blood sugar control is improving. 05/21/2023: Start patient on metformin 1000 Mg p.o. twice daily.   Mucous membrane pemphigoid [name], MD - 04/23/2023 8:45 AM EDT Formatting of this note is different from the original. Images from the original note were not included. Dermatology Note  05/15/2023: Hematology team is managing.  Extensive DVT left lower extremity: -Consult IR for possible mechanical thrombectomy and thrombolysis. -Will keep patient n.p.o. after midnight. 05/18/2023: For thrombectomy/thrombolysis of the left lower extremity DVT tomorrow by IR. 05/20/2023: Improvement in left lower extremity swelling.  Continue subcutaneous Lovenox.  Brief paroxysmal atrial fibrillation: -Asymptomatic. -Negative troponin. -Rate controlled. -Patient is on subcutaneous Lovenox. -Follow cardiology on discharge.  Hypokalemia: -DC IV fluids normal saline. -Replace potassium (K-Lor 40 mEq orally every 4 hours x 2 doses).  DVT prophylaxis: Heparin. Code Status: Full code. Family Communication: Sister. Disposition Plan: Home eventually.  Remains inpatient.   Consultants:  Follow-up with hematology on discharge. Interventional radiology team for possible mechanical thrombectomy and thrombolysis of left lower  extremity DVT. (Follow-up with cardiology  on discharge.)  Procedures:  None.  Antimicrobials:  IV Unasyn discontinued.    Subjective: -No fever or chills. -No shortness of breath. -Asymptomatic brief A-fib noted.  Objective: Vitals:   05/20/23 2045 05/20/23 2317 05/21/23 0256 05/21/23 0733  BP: 139/68 138/73 139/75 139/76  Pulse:  72  70  Resp: 20 17 15 15   Temp: 97.6 F (36.4 C) 98.4 F (36.9 C) 98.2 F (36.8 C) 98.5 F (36.9 C)  TempSrc: Oral Oral Oral Oral  SpO2: 98% 100% 95% 96%  Weight:      Height:        Intake/Output Summary (Last 24 hours) at 05/21/2023 1124 Last data filed at 05/21/2023 0600 Gross per 24 hour  Intake 1011.16 ml  Output --  Net 1011.16 ml   Filed Weights   05/14/23 1626  Weight: 81.2 kg    Examination: General exam: Appears calm and comfortable.  Patient is obese.  Cushingoid facies. Respiratory system: Clear to auscultation.  Cardiovascular system: S1 & S2 heard. Gastrointestinal system: Abdomen is obese, soft and nontender.   Central nervous system: Awake and alert.  Patient was all extremities.   Extremities: Swelling of left lower extremity is improving.   Data Reviewed: I have personally reviewed following labs and imaging studies  CBC: Recent Labs  Lab 05/14/23 1749 05/15/23 0146 05/17/23 0348 05/18/23 0202 05/19/23 0119 05/20/23 0101 05/21/23 0643  WBC 13.1*   < > 6.7 6.1 6.6 7.1 6.1  NEUTROABS 11.7*  --   --   --   --   --  5.1  HGB 11.7*   < > 8.7* 9.4* 10.0* 9.0* 8.7*  HCT 33.0*   < > 26.6* 28.0* 30.0* 26.7* 25.5*  MCV 94.3   < > 98.2 97.9 97.4 97.1 95.9  PLT 188   < > 223 273 285 297 265   < > = values in this interval not displayed.   Basic Metabolic Panel: Recent Labs  Lab 05/14/23 1749 05/15/23 0146 05/18/23 1129 05/20/23 1245 05/21/23 0643 05/21/23 0645  NA 130* 133* 141 139  --  138  K 4.5 3.3* 3.6 3.4*  --  2.8*  CL 100 103 112* 107  --  110  CO2 21* 18* 18* 20*  --  20*  GLUCOSE 407* 285*  124* 211*  --  112*  BUN 33* 25* 9 7*  --  7*  CREATININE 0.69 0.68 0.59 0.66  --  0.51  CALCIUM 10.4* 9.6 9.4 9.5  --  9.1  MG  --   --  1.9 1.8 2.0  --   PHOS  --  2.9 3.4 3.0  --  3.0   GFR: Estimated Creatinine Clearance: 78.4 mL/min (by C-G formula based on SCr of 0.51 mg/dL). Liver Function Tests: Recent Labs  Lab 05/14/23 1749 05/15/23 0146 05/18/23 1129 05/20/23 1245 05/21/23 0645  AST 26 21  --   --   --   ALT 42 39  --   --   --   ALKPHOS 74 74  --   --   --   BILITOT 1.5* 1.3*  --   --   --   PROT 6.5 5.9*  --   --   --   ALBUMIN 3.7 3.0* 2.6* 2.6* 2.4*   No results for input(s): "LIPASE", "AMYLASE" in the last 168 hours. No results for input(s): "AMMONIA" in the last 168 hours. Coagulation Profile: Recent Labs  Lab 05/14/23 1930 05/15/23 0146  INR 1.1 1.1  Cardiac Enzymes: No results for input(s): "CKTOTAL", "CKMB", "CKMBINDEX", "TROPONINI" in the last 168 hours. BNP (last 3 results) No results for input(s): "PROBNP" in the last 8760 hours. HbA1C: No results for input(s): "HGBA1C" in the last 72 hours.  CBG: Recent Labs  Lab 05/20/23 0527 05/20/23 1135 05/20/23 1626 05/20/23 2046 05/21/23 0607  GLUCAP 111* 198* 156* 129* 107*   Lipid Profile: No results for input(s): "CHOL", "HDL", "LDLCALC", "TRIG", "CHOLHDL", "LDLDIRECT" in the last 72 hours. Thyroid Function Tests: No results for input(s): "TSH", "T4TOTAL", "FREET4", "T3FREE", "THYROIDAB" in the last 72 hours. Anemia Panel: No results for input(s): "VITAMINB12", "FOLATE", "FERRITIN", "TIBC", "IRON", "RETICCTPCT" in the last 72 hours. Urine analysis: No results found for: "COLORURINE", "APPEARANCEUR", "LABSPEC", "PHURINE", "GLUCOSEU", "HGBUR", "BILIRUBINUR", "KETONESUR", "PROTEINUR", "UROBILINOGEN", "NITRITE", "LEUKOCYTESUR" Sepsis Labs: @LABRCNTIP (procalcitonin:4,lacticidven:4)  ) Recent Results (from the past 240 hour(s))  Resp panel by RT-PCR (RSV, Flu A&B, Covid) Anterior Nasal Swab      Status: None   Collection Time: 05/14/23  5:50 PM   Specimen: Anterior Nasal Swab  Result Value Ref Range Status   SARS Coronavirus 2 by RT PCR NEGATIVE NEGATIVE Final    Comment: (NOTE) SARS-CoV-2 target nucleic acids are NOT DETECTED.  The SARS-CoV-2 RNA is generally detectable in upper respiratory specimens during the acute phase of infection. The lowest concentration of SARS-CoV-2 viral copies this assay can detect is 138 copies/mL. A negative result does not preclude SARS-Cov-2 infection and should not be used as the sole basis for treatment or other patient management decisions. A negative result may occur with  improper specimen collection/handling, submission of specimen other than nasopharyngeal swab, presence of viral mutation(s) within the areas targeted by this assay, and inadequate number of viral copies(<138 copies/mL). A negative result must be combined with clinical observations, patient history, and epidemiological information. The expected result is Negative.  Fact Sheet for Patients:  BloggerCourse.com  Fact Sheet for Healthcare Providers:  SeriousBroker.it  This test is no t yet approved or cleared by the [place] FDA and  has been authorized for detection and/or diagnosis of SARS-CoV-2 by FDA under an Emergency Use Authorization (EUA). This EUA will remain  in effect (meaning this test can be used) for the duration of the COVID-19 declaration under Section 564(b)(1) of the Act, 21 U.S.C.section 360bbb-3(b)(1), unless the authorization is terminated  or revoked sooner.       Influenza A by PCR NEGATIVE NEGATIVE Final   Influenza B by PCR NEGATIVE NEGATIVE Final    Comment: (NOTE) The Xpert Xpress SARS-CoV-2/FLU/RSV plus assay is intended as an aid in the diagnosis of influenza from Nasopharyngeal swab specimens and should not be used as a sole basis for treatment. Nasal washings and aspirates are  unacceptable for Xpert Xpress SARS-CoV-2/FLU/RSV testing.  Fact Sheet for Patients: BloggerCourse.com  Fact Sheet for Healthcare Providers: SeriousBroker.it  This test is not yet approved or cleared by the [place] FDA and has been authorized for detection and/or diagnosis of SARS-CoV-2 by FDA under an Emergency Use Authorization (EUA). This EUA will remain in effect (meaning this test can be used) for the duration of the COVID-19 declaration under Section 564(b)(1) of the Act, 21 U.S.C. section 360bbb-3(b)(1), unless the authorization is terminated or revoked.     Resp Syncytial Virus by PCR NEGATIVE NEGATIVE Final    Comment: (NOTE) Fact Sheet for Patients: BloggerCourse.com  Fact Sheet for Healthcare Providers: SeriousBroker.it  This test is not yet approved or cleared by the [place] FDA and has  been authorized for detection and/or diagnosis of SARS-CoV-2 by FDA under an Emergency Use Authorization (EUA). This EUA will remain in effect (meaning this test can be used) for the duration of the COVID-19 declaration under Section 564(b)(1) of the Act, 21 U.S.C. section 360bbb-3(b)(1), unless the authorization is terminated or revoked.  Performed at [facility], [address], [place], [place] [address]   Culture, blood (Routine X 2) w Reflex to ID Panel     Status: None   Collection Time: 05/15/23  1:42 AM   Specimen: BLOOD LEFT ARM  Result Value Ref Range Status   Specimen Description BLOOD LEFT ARM  Final   Special Requests   Final    BOTTLES DRAWN AEROBIC AND ANAEROBIC Blood Culture adequate volume   Culture   Final    NO GROWTH 5 DAYS Performed at [facility], [address], [place], [place] [address]    Report Status 05/20/2023 FINAL  Final  Culture, blood (Routine X 2) w Reflex to ID Panel     Status: None   Collection Time:  05/15/23  1:51 AM   Specimen: BLOOD LEFT ARM  Result Value Ref Range Status   Specimen Description BLOOD LEFT ARM  Final   Special Requests   Final    BOTTLES DRAWN AEROBIC AND ANAEROBIC Blood Culture adequate volume   Culture   Final    NO GROWTH 5 DAYS Performed at [facility], [address] [place], [place] [address]    Report Status 05/20/2023 FINAL  Final         Radiology Studies: IR [facility] MOD SED  Result Date: 05/20/2023 INDICATION: 67 year old female presenting with acute pulmonary embolism and left lower extremity swelling with acute appearing thrombus extending from the left common iliac vein in the left calf veins. Concern for May-Thurner morphology on CT. EXAM: 1. Ultrasound-guided vascular access of the right common femoral vein. 2. Inferior vena cavogram. 3. Placement of infrarenal IVC filter. 4. Left lower extremity venogram. 5. Intravascular ultrasound. 6. Aspiration thrombectomy of left iliac and femoral veins. 7. Balloon angioplasty of left iliac and femoral veins. 8. Ultrasound-guided vascular access of the left common femoral vein. 9. Left iliac venous stent placement. COMPARISON:  None Available. MEDICATIONS: Benadryl 50 mg, intravenous ANESTHESIA/SEDATION: Versed 8 mg IV; Fentanyl 300 mcg IV Moderate Sedation Time:  118 minutes The patient was continuously monitored during the procedure by the interventional radiology nurse under my direct supervision. FLUOROSCOPY TIME:  One hundred forty-one mGy COMPLICATIONS: None immediate. TECHNIQUE: Informed written consent was obtained from the patient after a thorough discussion of the procedural risks, benefits and alternatives. All questions were addressed. Maximal Sterile Barrier Technique was utilized including caps, mask, sterile gowns, sterile gloves, sterile drape, hand hygiene and skin antiseptic. A timeout was performed prior to the initiation of the procedure. Preprocedure ultrasound evaluation of the  right common femoral vein demonstrated patency and compressibility. Subdermal Local anesthesia was provided at the planned needle entry site. A small skin nick was made. Under direct ultrasound visualization, the right common femoral vein was accessed with a 21 gauge micropuncture needle. A micropuncture sheath was placed and a Bentson wire was directed to the inferior vena cava. An 8 French sheath was then placed over the wire and inferior vena cavagram was performed which demonstrated normal caliber, widely patent inferior vena cava without vascular anomaly. Over the wire, an Argon option elite filter was introduced and deployed in infrarenal location under fluoroscopic  visualization. Post deployment inferior vena cavogram demonstrated proper positioning. Two Perclose devices were then pre deployed at the 10 o'clock and 2 o'clock positions in the right common femoral vein after removal of the 8 [place] sheath. After serial dilation was performed, a 33 cm 16 French dry seal sheath was inserted and positioned near the iliac vein confluence. The left common iliac vein was then cannulated using an Omni flush followed by a C2 catheter and Glidewire. The catheter was directed to the mid to peripheral left femoral vein. An exchange Rosen wire was then placed in the catheter was removed. Intravascular ultrasound was then performed with a Phillips 0.035 inch IVUS catheter. There was extensive, acute to subacute appearing thrombus throughout the iliac and femoral veins. Of note, there was severe focal compression of the left common iliac vein at the site of overlying right common iliac artery. Next, under intermittent fluoroscopic visualization, the 16 French penumbra flash aspiration catheter was inserted over the wire and aspiration was performed throughout the left common iliac, external iliac, common femoral, and central to mid femoral vein. There was copious acute and subacute appearing thrombus in the aspiration  canister. Approximately 4 passes were made through the segments. The aspiration catheter was removed and exchanged for a 4 French, 90 cm Navicross catheter which was positioned in the peripheral femoral vein. Left lower extremity venogram was performed which demonstrated persistent filling defects in the femoral vein without significant opacification of the iliac veins. Next, balloon maceration was performed throughout the thrombosed segment with a 10 mm by 80 mm Athletis balloon in the iliac veins and 8 cm x 40 mm mustang balloon throughout the central aspect of the femoral vein. After angioplasty, repeat venogram from the central femoral vein demonstrated significantly improved patency inflow with persistent mild luminal irregularity and stenosis in the left common iliac vein. Ultrasound evaluation of the left common femoral vein demonstrated a patent central channel with persistent peripheral, subacute appearing thrombus. Subdermal Local anesthesia was administered at the planned needle entry site. A small skin nick was made. Under direct ultrasound visualization, the left common femoral vein was accessed with a 21 gauge micropuncture needle. A micropuncture sheath was inserted in a Bentson wire was directed to the inferior vena cava. The micropuncture sheath was exchanged for a 9 French, 10 cm vascular sheath. 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 Intravascular ultrasound was used for measurement purposes of the vein. Left pelvic venogram was then performed confirming the findings on intravascular ultrasound. Under direct fluoroscopic visualization, a 14 mm x 150 mm operate stent was then deployed. Post deployment balloon venoplasty was then performed with a 14 mm x 4 cm atlas  balloon throughout the length of the stent. Completion bilateral pelvic venogram demonstrated patency of the stent with antegrade flow into the inferior vena cava. There were a few non flow limiting luminal filling defects throughout the stented segment. Completion inferior vena cavogram was then performed which demonstrated small focal filling defect within the apex of the implanted filter compatible with small volume of migrated thrombus. The left common femoral vein sheath was removed and hemostasis was achieved with manual compression. Sterile bandage was applied. The right common femoral sheath was then removed and the 2 Perclose devices were tied which achieve immediate hemostasis. A sterile  bandage was applied. The patient tolerated the procedure well was transferred back to the floor in good condition. IMPRESSION: 1. Acute to subacute left lower extremity deep vein thrombosis extending from the left common iliac veins the calf veins. 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 2. Technically successful placement of inferior vena cava filter. Interventional radiology will follow the patient as an outpatient with plans to evaluate for filter retrieval in approximately 3 months. 3. Technically successful computer-assisted aspiration thrombectomy and balloon venoplasty of the left iliac and femoral veins. 4. Technically successful placement of uncovered, self expanding stent (14 x 150 mm Abre) spanning the left common through left external iliac veins. PLAN: Discontinue heparin infusion in begin therapeutic Lovenox injections. The first dose was administered in Interventional Radiology. Recommend left lower extremity compression hose and SCDs and encourage ambulation. Recommend Lovenox for at least 1 month prior to switching to oral anticoagulation. Interventional radiology will follow all in patient and arrange 1  month follow-up as an outpatient with lower extremity venous duplex and CT venogram abdomen pelvis. Marliss Coots, MD [facility] Radiology Electronically Signed   By: Marliss Coots M.D.   On: 05/20/2023 09:54   IR INTRAVASCULAR ULTRASOUND NON CORONARY  Result Date: 05/20/2023 INDICATION: 67 year old female presenting with acute pulmonary embolism and left lower extremity swelling with acute appearing thrombus extending from the left common iliac vein in the left calf veins. Concern for May-Thurner morphology on CT. EXAM: 1. Ultrasound-guided vascular access of the right common femoral vein. 2. Inferior vena cavogram. 3. Placement of infrarenal IVC filter. 4. Left lower extremity venogram. 5. Intravascular ultrasound. 6. Aspiration thrombectomy of left iliac and femoral veins. 7. Balloon angioplasty of left iliac and femoral veins. 8. Ultrasound-guided vascular access of the left common femoral vein. 9. Left iliac venous stent placement. COMPARISON:  None Available. MEDICATIONS: Benadryl 50 mg, intravenous ANESTHESIA/SEDATION: Versed 8 mg IV; Fentanyl 300 mcg IV Moderate Sedation Time:  118 minutes The patient was continuously monitored during the procedure by the interventional radiology nurse under my direct supervision. FLUOROSCOPY TIME:  One hundred forty-one mGy COMPLICATIONS: None immediate. TECHNIQUE: Informed written consent was obtained from the patient after a thorough discussion of the procedural risks, benefits and alternatives. All questions were addressed. Maximal Sterile Barrier Technique was utilized including caps, mask, sterile gowns, sterile gloves, sterile drape, hand hygiene and skin antiseptic. A timeout was performed prior to the initiation of the procedure. Preprocedure ultrasound evaluation of the right common femoral vein demonstrated patency and compressibility. Subdermal Local anesthesia was provided at the planned needle entry site. A  small skin nick was made. Under direct ultrasound visualization, the right common femoral vein was accessed with a 21 gauge micropuncture needle. A micropuncture sheath was placed and a Bentson wire was directed to the inferior vena cava. An 8 French sheath was then placed over the wire and inferior vena cavagram was performed which demonstrated normal caliber, widely patent inferior vena cava without vascular anomaly. Over the wire, an Argon option elite filter was introduced and deployed in infrarenal location under fluoroscopic visualization. Post deployment inferior vena cavogram demonstrated proper positioning. Two Perclose devices were then pre deployed at the 10 o'clock and 2 o'clock positions in the right common femoral vein after removal of the 8 [place] sheath. After serial dilation was performed, a 33 cm 16 French dry seal sheath was inserted and positioned near the iliac  vein confluence. The left common iliac vein was then cannulated using an Omni flush followed by a C2 catheter and Glidewire. The catheter was directed to the mid to peripheral left femoral vein. An exchange Rosen wire was then placed in the catheter was removed. Intravascular ultrasound was then performed with a Phillips 0.035 inch IVUS catheter. There was extensive, acute to subacute appearing thrombus throughout the iliac and femoral veins. Of note, there was severe focal compression of the left common iliac vein at the site of overlying right common iliac artery. Next, under intermittent fluoroscopic visualization, the 16 French penumbra flash aspiration catheter was inserted over the wire and aspiration was performed throughout the left common iliac, external iliac, common femoral, and central to mid femoral vein. There was copious acute and subacute appearing thrombus in the aspiration canister. Approximately 4 passes were made through the segments. The aspiration catheter was removed and exchanged for a 4 French, 90 cm Navicross  catheter which was positioned in the peripheral femoral vein. Left lower extremity venogram was performed which demonstrated persistent filling defects in the femoral vein without significant opacification of the iliac veins. Next, balloon maceration was performed throughout the thrombosed segment with a 10 mm by 80 mm Athletis balloon in the iliac veins and 8 cm x 40 mm mustang balloon throughout the central aspect of the femoral vein. After angioplasty, repeat venogram from the central femoral vein demonstrated significantly improved patency inflow with persistent mild luminal irregularity and stenosis in the left common iliac vein. Ultrasound evaluation of the left common femoral vein demonstrated a patent central channel with persistent peripheral, subacute appearing thrombus. Subdermal Local anesthesia was administered at the planned needle entry site. A small skin nick was made. Under direct ultrasound visualization, the left common femoral vein was accessed with a 21 gauge micropuncture needle. A micropuncture sheath was inserted in a Bentson wire was directed to the inferior vena cava. The micropuncture sheath was exchanged for a 9 French, 10 cm vascular sheath. 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 Intravascular ultrasound was used for measurement purposes of the vein. Left pelvic venogram was then performed confirming the findings on intravascular ultrasound. Under direct fluoroscopic visualization, a 14 mm x 150 mm operate stent was then deployed. Post deployment balloon venoplasty was then performed with a 14 mm x 4 cm atlas balloon throughout the length of the stent. Completion bilateral pelvic venogram demonstrated patency of the stent with antegrade flow into the  inferior vena cava. There were a few non flow limiting luminal filling defects throughout the stented segment. Completion inferior vena cavogram was then performed which demonstrated small focal filling defect within the apex of the implanted filter compatible with small volume of migrated thrombus. The left common femoral vein sheath was removed and hemostasis was achieved with manual compression. Sterile bandage was applied. The right common femoral sheath was then removed and the 2 Perclose devices were tied which achieve immediate hemostasis. A sterile bandage was applied. The patient tolerated the procedure well was transferred back to the floor in good condition. IMPRESSION: 1. Acute to subacute left lower extremity deep vein thrombosis extending from the left common iliac veins the calf veins. 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 2. Technically successful placement of inferior vena cava filter. Interventional radiology will follow the patient as an outpatient with plans to evaluate for filter retrieval in approximately 3 months. 3. Technically successful computer-assisted aspiration thrombectomy and balloon venoplasty of the left iliac and femoral veins. 4. Technically successful placement of uncovered, self expanding stent (14 x 150 mm Abre) spanning the left common through left external iliac veins. PLAN: Discontinue heparin infusion in begin therapeutic Lovenox injections. The first dose was administered in Interventional Radiology. Recommend left lower extremity compression hose and SCDs and encourage ambulation. Recommend Lovenox for at least 1 month prior to switching to oral anticoagulation. Interventional radiology will follow all in patient and arrange 1 month follow-up as an outpatient with lower extremity venous duplex and CT venogram abdomen pelvis. Marliss Coots, MD [facility]  [facility]iology [facility] Radiology Electronically Signed   By: Marliss Coots M.D.   On: 05/20/2023 09:54   IR US Guide Vasc Access Right  Result Date: 05/20/2023 INDICATION: 67 year old female presenting with acute pulmonary embolism and left lower extremity swelling with acute appearing thrombus extending from the left common iliac vein in the left calf veins. Concern for May-Thurner morphology on CT. EXAM: 1. Ultrasound-guided vascular access of the right common femoral vein. 2. Inferior vena cavogram. 3. Placement of infrarenal IVC filter. 4. Left lower extremity venogram. 5. Intravascular ultrasound. 6. Aspiration thrombectomy of left iliac and femoral veins. 7. Balloon angioplasty of left iliac and femoral veins. 8. Ultrasound-guided vascular access of the left common femoral vein. 9. Left iliac venous stent placement. COMPARISON:  None Available. MEDICATIONS: Benadryl 50 mg, intravenous ANESTHESIA/SEDATION: Versed 8 mg IV; Fentanyl 300 mcg IV Moderate Sedation Time:  118 minutes The patient was continuously monitored during the procedure by the interventional radiology nurse under my direct supervision. FLUOROSCOPY TIME:  One hundred forty-one mGy COMPLICATIONS: None immediate. TECHNIQUE: Informed written consent was obtained from the patient after a thorough discussion of the procedural risks, benefits and alternatives. All questions were addressed. Maximal Sterile Barrier Technique was utilized including caps, mask, sterile gowns, sterile gloves, sterile drape, hand hygiene and skin antiseptic. A timeout was performed prior to the initiation of the procedure. Preprocedure ultrasound evaluation of the right common femoral vein demonstrated patency and compressibility. Subdermal Local anesthesia was provided at the planned needle entry site. A small skin nick was made. Under direct ultrasound visualization, the right common femoral vein was accessed with a 21 gauge micropuncture  needle. A micropuncture sheath was placed and a Bentson wire was directed to the inferior vena cava. An 8 French sheath was then placed over the wire and inferior vena cavagram was performed which demonstrated normal caliber, widely patent inferior vena cava without vascular anomaly. Over the wire, an Argon option elite filter was introduced and deployed in infrarenal location under fluoroscopic visualization. Post deployment inferior vena cavogram demonstrated proper positioning. Two Perclose devices were then pre deployed at the 10 o'clock and 2 o'clock positions in the right common femoral vein after removal of the 8 [place] sheath. After serial dilation was performed, a 33 cm 16 French dry seal sheath was inserted and positioned near the iliac vein confluence. The left common iliac vein was then cannulated using an Omni flush followed by a C2 catheter and Glidewire. The catheter was directed to the mid to peripheral left femoral vein. An exchange Rosen wire was then placed in the catheter was removed. Intravascular ultrasound was then performed with a Phillips 0.035 inch IVUS  catheter. There was extensive, acute to subacute appearing thrombus throughout the iliac and femoral veins. Of note, there was severe focal compression of the left common iliac vein at the site of overlying right common iliac artery. Next, under intermittent fluoroscopic visualization, the 16 French penumbra flash aspiration catheter was inserted over the wire and aspiration was performed throughout the left common iliac, external iliac, common femoral, and central to mid femoral vein. There was copious acute and subacute appearing thrombus in the aspiration canister. Approximately 4 passes were made through the segments. The aspiration catheter was removed and exchanged for a 4 French, 90 cm Navicross catheter which was positioned in the peripheral femoral vein. Left lower extremity venogram was performed which demonstrated persistent  filling defects in the femoral vein without significant opacification of the iliac veins. Next, balloon maceration was performed throughout the thrombosed segment with a 10 mm by 80 mm Athletis balloon in the iliac veins and 8 cm x 40 mm mustang balloon throughout the central aspect of the femoral vein. After angioplasty, repeat venogram from the central femoral vein demonstrated significantly improved patency inflow with persistent mild luminal irregularity and stenosis in the left common iliac vein. Ultrasound evaluation of the left common femoral vein demonstrated a patent central channel with persistent peripheral, subacute appearing thrombus. Subdermal Local anesthesia was administered at the planned needle entry site. A small skin nick was made. Under direct ultrasound visualization, the left common femoral vein was accessed with a 21 gauge micropuncture needle. A micropuncture sheath was inserted in a Bentson wire was directed to the inferior vena cava. The micropuncture sheath was exchanged for a 9 French, 10 cm vascular sheath. 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 Intravascular ultrasound was used for measurement purposes of the vein. Left pelvic venogram was then performed confirming the findings on intravascular ultrasound. Under direct fluoroscopic visualization, a 14 mm x 150 mm operate stent was then deployed. Post deployment balloon venoplasty was then performed with a 14 mm x 4 cm atlas balloon throughout the length of the stent. Completion bilateral pelvic venogram demonstrated patency of the stent with antegrade flow into the inferior vena cava. There were a few non flow limiting luminal filling defects throughout the stented segment. Completion inferior vena  cavogram was then performed which demonstrated small focal filling defect within the apex of the implanted filter compatible with small volume of migrated thrombus. The left common femoral vein sheath was removed and hemostasis was achieved with manual compression. Sterile bandage was applied. The right common femoral sheath was then removed and the 2 Perclose devices were tied which achieve immediate hemostasis. A sterile bandage was applied. The patient tolerated the procedure well was transferred back to the floor in good condition. IMPRESSION: 1. Acute to subacute left lower extremity deep vein thrombosis extending from the left common iliac veins the calf veins. 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 2. Technically successful placement of inferior vena cava filter. Interventional radiology will follow the patient as an outpatient with plans to evaluate for filter retrieval in approximately 3 months. 3. Technically successful computer-assisted aspiration thrombectomy and balloon venoplasty of the left iliac and femoral veins. 4. Technically successful placement of uncovered, self expanding  stent (14 x 150 mm Abre) spanning the left common through left external iliac veins. PLAN: Discontinue heparin infusion in begin therapeutic Lovenox injections. The first dose was administered in Interventional Radiology. Recommend left lower extremity compression hose and SCDs and encourage ambulation. Recommend Lovenox for at least 1 month prior to switching to oral anticoagulation. Interventional radiology will follow all in patient and arrange 1 month follow-up as an outpatient with lower extremity venous duplex and CT venogram abdomen pelvis. Marliss Coots, MD Vascular and Interventional Radiology Specialists [facility] Radiology Electronically Signed   By: Marliss Coots M.D.   On: 05/20/2023 09:54   IR IVC FILTER  PLMT / S&I Lenise Arena GUID/MOD SED  Result Date: 05/20/2023 INDICATION: 67 year old female presenting with acute pulmonary embolism and left lower extremity swelling with acute appearing thrombus extending from the left common iliac vein in the left calf veins. Concern for May-Thurner morphology on CT. EXAM: 1. Ultrasound-guided vascular access of the right common femoral vein. 2. Inferior vena cavogram. 3. Placement of infrarenal IVC filter. 4. Left lower extremity venogram. 5. Intravascular ultrasound. 6. Aspiration thrombectomy of left iliac and femoral veins. 7. Balloon angioplasty of left iliac and femoral veins. 8. Ultrasound-guided vascular access of the left common femoral vein. 9. Left iliac venous stent placement. COMPARISON:  None Available. MEDICATIONS: Benadryl 50 mg, intravenous ANESTHESIA/SEDATION: Versed 8 mg IV; Fentanyl 300 mcg IV Moderate Sedation Time:  118 minutes The patient was continuously monitored during the procedure by the interventional radiology nurse under my direct supervision. FLUOROSCOPY TIME:  One hundred forty-one mGy COMPLICATIONS: None immediate. TECHNIQUE: Informed written consent was obtained from the patient after a thorough discussion of the procedural risks, benefits and alternatives. All questions were addressed. Maximal Sterile Barrier Technique was utilized including caps, mask, sterile gowns, sterile gloves, sterile drape, hand hygiene and skin antiseptic. A timeout was performed prior to the initiation of the procedure. Preprocedure ultrasound evaluation of the right common femoral vein demonstrated patency and compressibility. Subdermal Local anesthesia was provided at the planned needle entry site. A small skin nick was made. Under direct ultrasound visualization, the right common femoral vein was accessed with a 21 gauge micropuncture needle. A micropuncture sheath was placed and a Bentson wire was directed to the inferior vena cava. An 8 French sheath was then placed  over the wire and inferior vena cavagram was performed which demonstrated normal caliber, widely patent inferior vena cava without vascular anomaly. Over the wire, an Argon option elite filter was introduced and deployed in infrarenal location under fluoroscopic visualization. Post deployment inferior vena cavogram demonstrated proper positioning. Two Perclose devices were then pre deployed at the 10 o'clock and 2 o'clock positions in the right common femoral vein after removal of the 8 [place] sheath. After serial dilation was performed, a 33 cm 16 French dry seal sheath was inserted and positioned near the iliac vein confluence. The left common iliac vein was then cannulated using an Omni flush followed by a C2 catheter and Glidewire. The catheter was directed to the mid to peripheral left femoral vein. An exchange Rosen wire was then placed in the catheter was removed. Intravascular ultrasound was then performed with a Phillips 0.035 inch IVUS catheter. There was extensive, acute to subacute appearing thrombus throughout the iliac and femoral veins. Of note, there was severe focal compression of the left common iliac vein at the site of overlying right common iliac artery. Next, under intermittent fluoroscopic visualization, the 16 French penumbra flash aspiration catheter was inserted over the  wire and aspiration was performed throughout the left common iliac, external iliac, common femoral, and central to mid femoral vein. There was copious acute and subacute appearing thrombus in the aspiration canister. Approximately 4 passes were made through the segments. The aspiration catheter was removed and exchanged for a 4 French, 90 cm Navicross catheter which was positioned in the peripheral femoral vein. Left lower extremity venogram was performed which demonstrated persistent filling defects in the femoral vein without significant opacification of the iliac veins. Next, balloon maceration was performed throughout  the thrombosed segment with a 10 mm by 80 mm Athletis balloon in the iliac veins and 8 cm x 40 mm mustang balloon throughout the central aspect of the femoral vein. After angioplasty, repeat venogram from the central femoral vein demonstrated significantly improved patency inflow with persistent mild luminal irregularity and stenosis in the left common iliac vein. Ultrasound evaluation of the left common femoral vein demonstrated a patent central channel with persistent peripheral, subacute appearing thrombus. Subdermal Local anesthesia was administered at the planned needle entry site. A small skin nick was made. Under direct ultrasound visualization, the left common femoral vein was accessed with a 21 gauge micropuncture needle. A micropuncture sheath was inserted in a Bentson wire was directed to the inferior vena cava. The micropuncture sheath was exchanged for a 9 French, 10 cm vascular sheath. 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 Intravascular ultrasound was used for measurement purposes of the vein. Left pelvic venogram was then performed confirming the findings on intravascular ultrasound. Under direct fluoroscopic visualization, a 14 mm x 150 mm operate stent was then deployed. Post deployment balloon venoplasty was then performed with a 14 mm x 4 cm atlas balloon throughout the length of the stent. Completion bilateral pelvic venogram demonstrated patency of the stent with antegrade flow into the inferior vena cava. There were a few non flow limiting luminal filling defects throughout the stented segment. Completion inferior vena cavogram was then performed which demonstrated small focal filling defect within the apex of the implanted filter compatible with small volume  of migrated thrombus. The left common femoral vein sheath was removed and hemostasis was achieved with manual compression. Sterile bandage was applied. The right common femoral sheath was then removed and the 2 Perclose devices were tied which achieve immediate hemostasis. A sterile bandage was applied. The patient tolerated the procedure well was transferred back to the floor in good condition. IMPRESSION: 1. Acute to subacute left lower extremity deep vein thrombosis extending from the left common iliac veins the calf veins. 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 2. Technically successful placement of inferior vena cava filter. Interventional radiology will follow the patient as an outpatient with plans to evaluate for filter retrieval in approximately 3 months. 3. Technically successful computer-assisted aspiration thrombectomy and balloon venoplasty of the left iliac and femoral veins. 4. Technically successful placement of uncovered, self expanding stent (14 x 150 mm Abre) spanning the left common through left external iliac veins. PLAN: Discontinue heparin infusion in begin therapeutic Lovenox injections. The first dose was administered in Interventional Radiology. Recommend left lower extremity compression hose and SCDs and encourage ambulation. Recommend Lovenox for at least 1 month prior to switching  to oral anticoagulation. Interventional radiology will follow all in patient and arrange 1 month follow-up as an outpatient with lower extremity venous duplex and CT venogram abdomen pelvis. Marliss Coots, MD Vascular and Interventional Radiology Specialists [facility] Radiology Electronically Signed   By: Marliss Coots M.D.   On: 05/20/2023 09:54   IR PTA VENOUS EXCEPT DIALYSIS CIRCUIT  Result Date: 05/20/2023 INDICATION: 66 year old female presenting with acute pulmonary embolism and left lower  extremity swelling with acute appearing thrombus extending from the left common iliac vein in the left calf veins. Concern for May-Thurner morphology on CT. EXAM: 1. Ultrasound-guided vascular access of the right common femoral vein. 2. Inferior vena cavogram. 3. Placement of infrarenal IVC filter. 4. Left lower extremity venogram. 5. Intravascular ultrasound. 6. Aspiration thrombectomy of left iliac and femoral veins. 7. Balloon angioplasty of left iliac and femoral veins. 8. Ultrasound-guided vascular access of the left common femoral vein. 9. Left iliac venous stent placement. COMPARISON:  None Available. MEDICATIONS: Benadryl 50 mg, intravenous ANESTHESIA/SEDATION: Versed 8 mg IV; Fentanyl 300 mcg IV Moderate Sedation Time:  118 minutes The patient was continuously monitored during the procedure by the interventional radiology nurse under my direct supervision. FLUOROSCOPY TIME:  One hundred forty-one mGy COMPLICATIONS: None immediate. TECHNIQUE: Informed written consent was obtained from the patient after a thorough discussion of the procedural risks, benefits and alternatives. All questions were addressed. Maximal Sterile Barrier Technique was utilized including caps, mask, sterile gowns, sterile gloves, sterile drape, hand hygiene and skin antiseptic. A timeout was performed prior to the initiation of the procedure. Preprocedure ultrasound evaluation of the right common femoral vein demonstrated patency and compressibility. Subdermal Local anesthesia was provided at the planned needle entry site. A small skin nick was made. Under direct ultrasound visualization, the right common femoral vein was accessed with a 21 gauge micropuncture needle. A micropuncture sheath was placed and a Bentson wire was directed to the inferior vena cava. An 8 French sheath was then placed over the wire and inferior vena cavagram was performed which demonstrated normal caliber, widely patent inferior vena cava without vascular  anomaly. Over the wire, an Argon option elite filter was introduced and deployed in infrarenal location under fluoroscopic visualization. Post deployment inferior vena cavogram demonstrated proper positioning. Two Perclose devices were then pre deployed at the 10 o'clock and 2 o'clock positions in the right common femoral vein after removal of the 8 [place] sheath. After serial dilation was performed, a 33 cm 16 French dry seal sheath was inserted and positioned near the iliac vein confluence. The left common iliac vein was then cannulated using an Omni flush followed by a C2 catheter and Glidewire. The catheter was directed to the mid to peripheral left femoral vein. An exchange Rosen wire was then placed in the catheter was removed. Intravascular ultrasound was then performed with a Phillips 0.035 inch IVUS catheter. There was extensive, acute to subacute appearing thrombus throughout the iliac and femoral veins. Of note, there was severe focal compression of the left common iliac vein at the site of overlying right common iliac artery. Next, under intermittent fluoroscopic visualization, the 16 French penumbra flash aspiration catheter was inserted over the wire and aspiration was performed throughout the left common iliac, external iliac, common femoral, and central to mid femoral vein. There was copious acute and subacute appearing thrombus in the aspiration canister. Approximately 4 passes were made through the segments. The aspiration catheter was removed and exchanged for a 4 French, 90 cm Navicross catheter which  was positioned in the peripheral femoral vein. Left lower extremity venogram was performed which demonstrated persistent filling defects in the femoral vein without significant opacification of the iliac veins. Next, balloon maceration was performed throughout the thrombosed segment with a 10 mm by 80 mm Athletis balloon in the iliac veins and 8 cm x 40 mm mustang balloon throughout the central  aspect of the femoral vein. After angioplasty, repeat venogram from the central femoral vein demonstrated significantly improved patency inflow with persistent mild luminal irregularity and stenosis in the left common iliac vein. Ultrasound evaluation of the left common femoral vein demonstrated a patent central channel with persistent peripheral, subacute appearing thrombus. Subdermal Local anesthesia was administered at the planned needle entry site. A small skin nick was made. Under direct ultrasound visualization, the left common femoral vein was accessed with a 21 gauge micropuncture needle. A micropuncture sheath was inserted in a Bentson wire was directed to the inferior vena cava. The micropuncture sheath was exchanged for a 9 French, 10 cm vascular sheath. 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 Intravascular ultrasound was used for measurement purposes of the vein. Left pelvic venogram was then performed confirming the findings on intravascular ultrasound. Under direct fluoroscopic visualization, a 14 mm x 150 mm operate stent was then deployed. Post deployment balloon venoplasty was then performed with a 14 mm x 4 cm atlas balloon throughout the length of the stent. Completion bilateral pelvic venogram demonstrated patency of the stent with antegrade flow into the inferior vena cava. There were a few non flow limiting luminal filling defects throughout the stented segment. Completion inferior vena cavogram was then performed which demonstrated small focal filling defect within the apex of the implanted filter compatible with small volume of migrated thrombus. The left common femoral vein sheath was removed and hemostasis was achieved with manual compression. Sterile  bandage was applied. The right common femoral sheath was then removed and the 2 Perclose devices were tied which achieve immediate hemostasis. A sterile bandage was applied. The patient tolerated the procedure well was transferred back to the floor in good condition. IMPRESSION: 1. Acute to subacute left lower extremity deep vein thrombosis extending from the left common iliac veins the calf veins. 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 2. Technically successful placement of inferior vena cava filter. Interventional radiology will follow the patient as an outpatient with plans to evaluate for filter retrieval in approximately 3 months. 3. Technically successful computer-assisted aspiration thrombectomy and balloon venoplasty of the left iliac and femoral veins. 4. Technically successful placement of uncovered, self expanding stent (14 x 150 mm Abre) spanning the left common through left external iliac veins. PLAN: Discontinue heparin infusion in begin therapeutic Lovenox injections. The first dose was administered in Interventional Radiology. Recommend left lower extremity compression hose and SCDs and encourage ambulation. Recommend Lovenox for at least 1 month prior to switching to oral anticoagulation. Interventional radiology will follow all in patient and arrange 1 month follow-up as an outpatient with lower extremity venous duplex and CT venogram abdomen pelvis. Marliss Coots, MD [facility] Radiology Electronically Signed   By: Marliss Coots M.D.   On: 05/20/2023 09:54   IR US  Guide Vasc Access Left  Result Date: 05/20/2023 INDICATION: 68 year old female presenting with acute pulmonary embolism and left lower extremity swelling with acute appearing thrombus extending from the left common iliac vein in the left calf veins. Concern for May-Thurner  morphology on CT. EXAM: 1. Ultrasound-guided vascular access of the right common femoral vein. 2. Inferior vena cavogram. 3. Placement of infrarenal IVC filter. 4. Left lower extremity venogram. 5. Intravascular ultrasound. 6. Aspiration thrombectomy of left iliac and femoral veins. 7. Balloon angioplasty of left iliac and femoral veins. 8. Ultrasound-guided vascular access of the left common femoral vein. 9. Left iliac venous stent placement. COMPARISON:  None Available. MEDICATIONS: Benadryl 50 mg, intravenous ANESTHESIA/SEDATION: Versed 8 mg IV; Fentanyl 300 mcg IV Moderate Sedation Time:  118 minutes The patient was continuously monitored during the procedure by the interventional radiology nurse under my direct supervision. FLUOROSCOPY TIME:  One hundred forty-one mGy COMPLICATIONS: None immediate. TECHNIQUE: Informed written consent was obtained from the patient after a thorough discussion of the procedural risks, benefits and alternatives. All questions were addressed. Maximal Sterile Barrier Technique was utilized including caps, mask, sterile gowns, sterile gloves, sterile drape, hand hygiene and skin antiseptic. A timeout was performed prior to the initiation of the procedure. Preprocedure ultrasound evaluation of the right common femoral vein demonstrated patency and compressibility. Subdermal Local anesthesia was provided at the planned needle entry site. A small skin nick was made. Under direct ultrasound visualization, the right common femoral vein was accessed with a 21 gauge micropuncture needle. A micropuncture sheath was placed and a Bentson wire was directed to the inferior vena cava. An 8 French sheath was then placed over the wire and inferior vena cavagram was performed which demonstrated normal caliber, widely patent inferior vena cava without vascular anomaly. Over the wire, an Argon option elite filter was introduced and deployed in infrarenal location under fluoroscopic visualization. Post  deployment inferior vena cavogram demonstrated proper positioning. Two Perclose devices were then pre deployed at the 10 o'clock and 2 o'clock positions in the right common femoral vein after removal of the 8 [place] sheath. After serial dilation was performed, a 33 cm 16 French dry seal sheath was inserted and positioned near the iliac vein confluence. The left common iliac vein was then cannulated using an Omni flush followed by a C2 catheter and Glidewire. The catheter was directed to the mid to peripheral left femoral vein. An exchange Rosen wire was then placed in the catheter was removed. Intravascular ultrasound was then performed with a Phillips 0.035 inch IVUS catheter. There was extensive, acute to subacute appearing thrombus throughout the iliac and femoral veins. Of note, there was severe focal compression of the left common iliac vein at the site of overlying right common iliac artery. Next, under intermittent fluoroscopic visualization, the 16 French penumbra flash aspiration catheter was inserted over the wire and aspiration was performed throughout the left common iliac, external iliac, common femoral, and central to mid femoral vein. There was copious acute and subacute appearing thrombus in the aspiration canister. Approximately 4 passes were made through the segments. The aspiration catheter was removed and exchanged for a 4 French, 90 cm Navicross catheter which was positioned in the peripheral femoral vein. Left lower extremity venogram was performed which demonstrated persistent filling defects in the femoral vein without significant opacification of the iliac veins. Next, balloon maceration was performed throughout the thrombosed segment with a 10 mm by 80 mm Athletis balloon in the iliac veins and 8 cm x 40  mm mustang balloon throughout the central aspect of the femoral vein. After angioplasty, repeat venogram from the central femoral vein demonstrated significantly improved patency inflow  with persistent mild luminal irregularity and stenosis in the left common iliac vein. Ultrasound evaluation of the left common femoral vein demonstrated a patent central channel with persistent peripheral, subacute appearing thrombus. Subdermal Local anesthesia was administered at the planned needle entry site. A small skin nick was made. Under direct ultrasound visualization, the left common femoral vein was accessed with a 21 gauge micropuncture needle. A micropuncture sheath was inserted in a Bentson wire was directed to the inferior vena cava. The micropuncture sheath was exchanged for a 9 French, 10 cm vascular sheath. 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 Intravascular ultrasound was used for measurement purposes of the vein. Left pelvic venogram was then performed confirming the findings on intravascular ultrasound. Under direct fluoroscopic visualization, a 14 mm x 150 mm operate stent was then deployed. Post deployment balloon venoplasty was then performed with a 14 mm x 4 cm atlas balloon throughout the length of the stent. Completion bilateral pelvic venogram demonstrated patency of the stent with antegrade flow into the inferior vena cava. There were a few non flow limiting luminal filling defects throughout the stented segment. Completion inferior vena cavogram was then performed which demonstrated small focal filling defect within the apex of the implanted filter compatible with small volume of migrated thrombus. The left common femoral vein sheath was removed and hemostasis was achieved with manual compression. Sterile bandage was applied. The right common femoral sheath was then removed and the 2 Perclose devices were tied which achieve immediate hemostasis. A  sterile bandage was applied. The patient tolerated the procedure well was transferred back to the floor in good condition. IMPRESSION: 1. Acute to subacute left lower extremity deep vein thrombosis extending from the left common iliac veins the calf veins. 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 2. Technically successful placement of inferior vena cava filter. Interventional radiology will follow the patient as an outpatient with plans to evaluate for filter retrieval in approximately 3 months. 3. Technically successful computer-assisted aspiration thrombectomy and balloon venoplasty of the left iliac and femoral veins. 4. Technically successful placement of uncovered, self expanding stent (14 x 150 mm Abre) spanning the left common through left external iliac veins. PLAN: Discontinue heparin infusion in begin therapeutic Lovenox injections. The first dose was administered in Interventional Radiology. Recommend left lower extremity compression hose and SCDs and encourage ambulation. Recommend Lovenox for at least 1 month prior to switching to oral anticoagulation. Interventional radiology will follow all in patient and arrange 1 month follow-up as an outpatient with lower extremity venous duplex and CT venogram abdomen pelvis. Marliss Coots, MD [facility] Radiology Electronically Signed   By: Marliss Coots M.D.   On: 05/20/2023 09:54   IR Venocavagram Ivc  Result Date: 05/20/2023 INDICATION: 67 year old female presenting with acute pulmonary embolism and left lower extremity swelling with acute appearing thrombus extending from the left common iliac vein in the left calf veins. Concern for May-Thurner morphology on CT. EXAM: 1. Ultrasound-guided vascular access of the right common femoral vein. 2. Inferior vena cavogram. 3.  Placement of infrarenal IVC filter. 4.  Left lower extremity venogram. 5. Intravascular ultrasound. 6. Aspiration thrombectomy of left iliac and femoral veins. 7. Balloon angioplasty of left iliac and femoral veins. 8. Ultrasound-guided vascular access of the left common femoral vein. 9. Left iliac venous stent placement. COMPARISON:  None Available. MEDICATIONS: Benadryl 50 mg, intravenous ANESTHESIA/SEDATION: Versed 8 mg IV; Fentanyl 300 mcg IV Moderate Sedation Time:  118 minutes The patient was continuously monitored during the procedure by the interventional radiology nurse under my direct supervision. FLUOROSCOPY TIME:  One hundred forty-one mGy COMPLICATIONS: None immediate. TECHNIQUE: Informed written consent was obtained from the patient after a thorough discussion of the procedural risks, benefits and alternatives. All questions were addressed. Maximal Sterile Barrier Technique was utilized including caps, mask, sterile gowns, sterile gloves, sterile drape, hand hygiene and skin antiseptic. A timeout was performed prior to the initiation of the procedure. Preprocedure ultrasound evaluation of the right common femoral vein demonstrated patency and compressibility. Subdermal Local anesthesia was provided at the planned needle entry site. A small skin nick was made. Under direct ultrasound visualization, the right common femoral vein was accessed with a 21 gauge micropuncture needle. A micropuncture sheath was placed and a Bentson wire was directed to the inferior vena cava. An 8 French sheath was then placed over the wire and inferior vena cavagram was performed which demonstrated normal caliber, widely patent inferior vena cava without vascular anomaly. Over the wire, an Argon option elite filter was introduced and deployed in infrarenal location under fluoroscopic visualization. Post deployment inferior vena cavogram demonstrated proper positioning. Two Perclose devices were then pre deployed at the 10 o'clock and 2 o'clock positions in the  right common femoral vein after removal of the 8 [place] sheath. After serial dilation was performed, a 33 cm 16 French dry seal sheath was inserted and positioned near the iliac vein confluence. The left common iliac vein was then cannulated using an Omni flush followed by a C2 catheter and Glidewire. The catheter was directed to the mid to peripheral left femoral vein. An exchange Rosen wire was then placed in the catheter was removed. Intravascular ultrasound was then performed with a Phillips 0.035 inch IVUS catheter. There was extensive, acute to subacute appearing thrombus throughout the iliac and femoral veins. Of note, there was severe focal compression of the left common iliac vein at the site of overlying right common iliac artery. Next, under intermittent fluoroscopic visualization, the 16 French penumbra flash aspiration catheter was inserted over the wire and aspiration was performed throughout the left common iliac, external iliac, common femoral, and central to mid femoral vein. There was copious acute and subacute appearing thrombus in the aspiration canister. Approximately 4 passes were made through the segments. The aspiration catheter was removed and exchanged for a 4 French, 90 cm Navicross catheter which was positioned in the peripheral femoral vein. Left lower extremity venogram was performed which demonstrated persistent filling defects in the femoral vein without significant opacification of the iliac veins. Next, balloon maceration was performed throughout the thrombosed segment with a 10 mm by 80 mm Athletis balloon in the iliac veins and 8 cm x 40 mm mustang balloon throughout the central aspect of the femoral vein. After angioplasty, repeat venogram from the central femoral vein demonstrated significantly improved patency inflow with persistent mild luminal irregularity and stenosis in the left common iliac vein. Ultrasound evaluation of the left common femoral vein demonstrated a patent  central channel with persistent peripheral, subacute appearing thrombus. Subdermal  Local anesthesia was administered at the planned needle entry site. A small skin nick was made. Under direct ultrasound visualization, the left common femoral vein was accessed with a 21 gauge micropuncture needle. A micropuncture sheath was inserted in a Bentson wire was directed to the inferior vena cava. The micropuncture sheath was exchanged for a 9 French, 10 cm vascular sheath. 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 Intravascular ultrasound was used for measurement purposes of the vein. Left pelvic venogram was then performed confirming the findings on intravascular ultrasound. Under direct fluoroscopic visualization, a 14 mm x 150 mm operate stent was then deployed. Post deployment balloon venoplasty was then performed with a 14 mm x 4 cm atlas balloon throughout the length of the stent. Completion bilateral pelvic venogram demonstrated patency of the stent with antegrade flow into the inferior vena cava. There were a few non flow limiting luminal filling defects throughout the stented segment. Completion inferior vena cavogram was then performed which demonstrated small focal filling defect within the apex of the implanted filter compatible with small volume of migrated thrombus. The left common femoral vein sheath was removed and hemostasis was achieved with manual compression. Sterile bandage was applied. The right common femoral sheath was then removed and the 2 Perclose devices were tied which achieve immediate hemostasis. A sterile bandage was applied. The patient tolerated the procedure well was transferred back to the floor in good condition. IMPRESSION: 1. Acute to subacute left lower  extremity deep vein thrombosis extending from the left common iliac veins the calf veins. 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 2. Technically successful placement of inferior vena cava filter. Interventional radiology will follow the patient as an outpatient with plans to evaluate for filter retrieval in approximately 3 months. 3. Technically successful computer-assisted aspiration thrombectomy and balloon venoplasty of the left iliac and femoral veins. 4. Technically successful placement of uncovered, self expanding stent (14 x 150 mm Abre) spanning the left common through left external iliac veins. PLAN: Discontinue heparin infusion in begin therapeutic Lovenox injections. The first dose was administered in Interventional Radiology. Recommend left lower extremity compression hose and SCDs and encourage ambulation. Recommend Lovenox for at least 1 month prior to switching to oral anticoagulation. Interventional radiology will follow all in patient and arrange 1 month follow-up as an outpatient with lower extremity venous duplex and CT venogram abdomen pelvis. Marliss Coots, MD [facility] Radiology Electronically Signed   By: Marliss Coots M.D.   On: 05/20/2023 09:54   IR Veno/Ext/Uni Left  Result Date: 05/20/2023 INDICATION: 67 year old female presenting with acute pulmonary embolism and left lower extremity swelling with acute appearing thrombus extending from the left common iliac vein in the left calf veins. Concern for May-Thurner morphology on CT. EXAM: 1. Ultrasound-guided vascular access of the right common femoral vein. 2. Inferior vena cavogram. 3. Placement of infrarenal IVC filter. 4. Left lower extremity venogram. 5. Intravascular ultrasound. 6. Aspiration thrombectomy of left iliac and femoral veins. 7. Balloon angioplasty of left iliac and  femoral veins. 8. Ultrasound-guided vascular access of the left common femoral vein. 9. Left iliac venous stent placement. COMPARISON:  None Available. MEDICATIONS: Benadryl 50 mg, intravenous ANESTHESIA/SEDATION: Versed  8 mg IV; Fentanyl 300 mcg IV Moderate Sedation Time:  118 minutes The patient was continuously monitored during the procedure by the interventional radiology nurse under my direct supervision. FLUOROSCOPY TIME:  One hundred forty-one mGy COMPLICATIONS: None immediate. TECHNIQUE: Informed written consent was obtained from the patient after a thorough discussion of the procedural risks, benefits and alternatives. All questions were addressed. Maximal Sterile Barrier Technique was utilized including caps, mask, sterile gowns, sterile gloves, sterile drape, hand hygiene and skin antiseptic. A timeout was performed prior to the initiation of the procedure. Preprocedure ultrasound evaluation of the right common femoral vein demonstrated patency and compressibility. Subdermal Local anesthesia was provided at the planned needle entry site. A small skin nick was made. Under direct ultrasound visualization, the right common femoral vein was accessed with a 21 gauge micropuncture needle. A micropuncture sheath was placed and a Bentson wire was directed to the inferior vena cava. An 8 French sheath was then placed over the wire and inferior vena cavagram was performed which demonstrated normal caliber, widely patent inferior vena cava without vascular anomaly. Over the wire, an Argon option elite filter was introduced and deployed in infrarenal location under fluoroscopic visualization. Post deployment inferior vena cavogram demonstrated proper positioning. Two Perclose devices were then pre deployed at the 10 o'clock and 2 o'clock positions in the right common femoral vein after removal of the 8 [place] sheath. After serial dilation was performed, a 33 cm 16 French dry seal sheath was inserted and positioned  near the iliac vein confluence. The left common iliac vein was then cannulated using an Omni flush followed by a C2 catheter and Glidewire. The catheter was directed to the mid to peripheral left femoral vein. An exchange Rosen wire was then placed in the catheter was removed. Intravascular ultrasound was then performed with a Phillips 0.035 inch IVUS catheter. There was extensive, acute to subacute appearing thrombus throughout the iliac and femoral veins. Of note, there was severe focal compression of the left common iliac vein at the site of overlying right common iliac artery. Next, under intermittent fluoroscopic visualization, the 16 French penumbra flash aspiration catheter was inserted over the wire and aspiration was performed throughout the left common iliac, external iliac, common femoral, and central to mid femoral vein. There was copious acute and subacute appearing thrombus in the aspiration canister. Approximately 4 passes were made through the segments. The aspiration catheter was removed and exchanged for a 4 French, 90 cm Navicross catheter which was positioned in the peripheral femoral vein. Left lower extremity venogram was performed which demonstrated persistent filling defects in the femoral vein without significant opacification of the iliac veins. Next, balloon maceration was performed throughout the thrombosed segment with a 10 mm by 80 mm Athletis balloon in the iliac veins and 8 cm x 40 mm mustang balloon throughout the central aspect of the femoral vein. After angioplasty, repeat venogram from the central femoral vein demonstrated significantly improved patency inflow with persistent mild luminal irregularity and stenosis in the left common iliac vein. Ultrasound evaluation of the left common femoral vein demonstrated a patent central channel with persistent peripheral, subacute appearing thrombus. Subdermal Local anesthesia was administered at the planned needle entry site. A small skin  nick was made. Under direct ultrasound visualization, the left common femoral vein was accessed with a 21 gauge micropuncture needle. A micropuncture sheath was inserted in a Bentson wire was directed to the inferior vena cava. The micropuncture sheath was exchanged for a 9 [place], 10  cm vascular sheath. 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 Intravascular ultrasound was used for measurement purposes of the vein. Left pelvic venogram was then performed confirming the findings on intravascular ultrasound. Under direct fluoroscopic visualization, a 14 mm x 150 mm operate stent was then deployed. Post deployment balloon venoplasty was then performed with a 14 mm x 4 cm atlas balloon throughout the length of the stent. Completion bilateral pelvic venogram demonstrated patency of the stent with antegrade flow into the inferior vena cava. There were a few non flow limiting luminal filling defects throughout the stented segment. Completion inferior vena cavogram was then performed which demonstrated small focal filling defect within the apex of the implanted filter compatible with small volume of migrated thrombus. The left common femoral vein sheath was removed and hemostasis was achieved with manual compression. Sterile bandage was applied. The right common femoral sheath was then removed and the 2 Perclose devices were tied which achieve immediate hemostasis. A sterile bandage was applied. The patient tolerated the procedure well was transferred back to the floor in good condition. IMPRESSION: 1. Acute to subacute left lower extremity deep vein thrombosis extending from the left common iliac veins the calf veins. 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 2. Technically successful placement of inferior vena cava filter. Interventional radiology will follow the patient as an outpatient with plans to evaluate for filter retrieval in approximately 3 months. 3. Technically successful computer-assisted aspiration thrombectomy and balloon venoplasty of the left iliac and femoral veins. 4. Technically successful placement of uncovered, self expanding stent (14 x 150 mm Abre) spanning the left common through left external iliac veins. PLAN: Discontinue heparin infusion in begin therapeutic Lovenox injections. The first dose was administered in Interventional Radiology. Recommend left lower extremity compression hose and SCDs and encourage ambulation. Recommend Lovenox for at least 1 month prior to switching to oral anticoagulation. Interventional radiology will follow all in patient and arrange 1 month follow-up as an outpatient with lower extremity venous duplex and CT venogram abdomen pelvis. Marliss Coots, MD [facility] Radiology Electronically Signed   By: Marliss Coots M.D.   On: 05/20/2023 09:54   IR TRANSCATH PLC STENT 1ST ART NOT LE CV CAR VERT CAR  Result Date: 05/20/2023 INDICATION: 67 year old female presenting with acute pulmonary embolism and left lower extremity swelling with acute appearing thrombus extending from the left common iliac vein in the left calf veins. Concern for May-Thurner morphology on CT. EXAM: 1. Ultrasound-guided vascular access of the right common femoral vein. 2. Inferior vena cavogram. 3. Placement of infrarenal IVC filter. 4. Left lower extremity venogram. 5. Intravascular ultrasound. 6. Aspiration thrombectomy of left iliac and femoral veins. 7. Balloon angioplasty of left iliac and femoral veins. 8. Ultrasound-guided vascular access of the left common femoral vein. 9. Left iliac venous stent placement.  COMPARISON:  None Available. MEDICATIONS: Benadryl 50 mg, intravenous ANESTHESIA/SEDATION: Versed 8 mg IV; Fentanyl 300 mcg IV Moderate Sedation Time:  118 minutes The patient was continuously monitored during the procedure by the interventional radiology nurse under my direct supervision. FLUOROSCOPY TIME:  One hundred forty-one mGy COMPLICATIONS: None immediate. TECHNIQUE: Informed written consent was obtained from the patient after  a thorough discussion of the procedural risks, benefits and alternatives. All questions were addressed. Maximal Sterile Barrier Technique was utilized including caps, mask, sterile gowns, sterile gloves, sterile drape, hand hygiene and skin antiseptic. A timeout was performed prior to the initiation of the procedure. Preprocedure ultrasound evaluation of the right common femoral vein demonstrated patency and compressibility. Subdermal Local anesthesia was provided at the planned needle entry site. A small skin nick was made. Under direct ultrasound visualization, the right common femoral vein was accessed with a 21 gauge micropuncture needle. A micropuncture sheath was placed and a Bentson wire was directed to the inferior vena cava. An 8 French sheath was then placed over the wire and inferior vena cavagram was performed which demonstrated normal caliber, widely patent inferior vena cava without vascular anomaly. Over the wire, an Argon option elite filter was introduced and deployed in infrarenal location under fluoroscopic visualization. Post deployment inferior vena cavogram demonstrated proper positioning. Two Perclose devices were then pre deployed at the 10 o'clock and 2 o'clock positions in the right common femoral vein after removal of the 8 [place] sheath. After serial dilation was performed, a 33 cm 16 French dry seal sheath was inserted and positioned near the iliac vein confluence. The left common iliac vein was then cannulated using an Omni flush followed by a C2 catheter  and Glidewire. The catheter was directed to the mid to peripheral left femoral vein. An exchange Rosen wire was then placed in the catheter was removed. Intravascular ultrasound was then performed with a Phillips 0.035 inch IVUS catheter. There was extensive, acute to subacute appearing thrombus throughout the iliac and femoral veins. Of note, there was severe focal compression of the left common iliac vein at the site of overlying right common iliac artery. Next, under intermittent fluoroscopic visualization, the 16 French penumbra flash aspiration catheter was inserted over the wire and aspiration was performed throughout the left common iliac, external iliac, common femoral, and central to mid femoral vein. There was copious acute and subacute appearing thrombus in the aspiration canister. Approximately 4 passes were made through the segments. The aspiration catheter was removed and exchanged for a 4 French, 90 cm Navicross catheter which was positioned in the peripheral femoral vein. Left lower extremity venogram was performed which demonstrated persistent filling defects in the femoral vein without significant opacification of the iliac veins. Next, balloon maceration was performed throughout the thrombosed segment with a 10 mm by 80 mm Athletis balloon in the iliac veins and 8 cm x 40 mm mustang balloon throughout the central aspect of the femoral vein. After angioplasty, repeat venogram from the central femoral vein demonstrated significantly improved patency inflow with persistent mild luminal irregularity and stenosis in the left common iliac vein. Ultrasound evaluation of the left common femoral vein demonstrated a patent central channel with persistent peripheral, subacute appearing thrombus. Subdermal Local anesthesia was administered at the planned needle entry site. A small skin nick was made. Under direct ultrasound visualization, the left common femoral vein was accessed with a 21 gauge micropuncture  needle. A micropuncture sheath was inserted in a Bentson wire was directed to the inferior vena cava. The micropuncture sheath was exchanged for a 9 French, 10 cm vascular sheath. 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 Intravascular ultrasound was used  for measurement purposes of the vein. Left pelvic venogram was then performed confirming the findings on intravascular ultrasound. Under direct fluoroscopic visualization, a 14 mm x 150 mm operate stent was then deployed. Post deployment balloon venoplasty was then performed with a 14 mm x 4 cm atlas balloon throughout the length of the stent. Completion bilateral pelvic venogram demonstrated patency of the stent with antegrade flow into the inferior vena cava. There were a few non flow limiting luminal filling defects throughout the stented segment. Completion inferior vena cavogram was then performed which demonstrated small focal filling defect within the apex of the implanted filter compatible with small volume of migrated thrombus. The left common femoral vein sheath was removed and hemostasis was achieved with manual compression. Sterile bandage was applied. The right common femoral sheath was then removed and the 2 Perclose devices were tied which achieve immediate hemostasis. A sterile bandage was applied. The patient tolerated the procedure well was transferred back to the floor in good condition. IMPRESSION: 1. Acute to subacute left lower extremity deep vein thrombosis extending from the left common iliac veins the calf veins. 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 2. Technically successful placement of inferior vena  cava filter. Interventional radiology will follow the patient as an outpatient with plans to evaluate for filter retrieval in approximately 3 months. 3. Technically successful computer-assisted aspiration thrombectomy and balloon venoplasty of the left iliac and femoral veins. 4. Technically successful placement of uncovered, self expanding stent (14 x 150 mm Abre) spanning the left common through left external iliac veins. PLAN: Discontinue heparin infusion in begin therapeutic Lovenox injections. The first dose was administered in Interventional Radiology. Recommend left lower extremity compression hose and SCDs and encourage ambulation. Recommend Lovenox for at least 1 month prior to switching to oral anticoagulation. Interventional radiology will follow all in patient and arrange 1 month follow-up as an outpatient with lower extremity venous duplex and CT venogram abdomen pelvis. Marliss Coots, MD [facility] Radiology Electronically Signed   By: Marliss Coots M.D.   On: 05/20/2023 09:54        Scheduled Meds:  enoxaparin (LOVENOX) injection  80 mg Subcutaneous Q12H   folic acid  1 mg Oral Daily   insulin aspart  0-15 Units Subcutaneous TID WC   insulin aspart  0-5 Units Subcutaneous QHS   insulin glargine-yfgn  10 Units Subcutaneous Daily   losartan  25 mg Oral Daily   methotrexate  10 mg Oral Weekly   potassium chloride  40 mEq Oral Q4H   predniSONE  20 mg Oral Q breakfast   sodium chloride flush  3 mL Intravenous Q12H   Continuous Infusions:  sodium chloride 75 mL/hr at 05/21/23 0723   diltiazem (CARDIZEM) infusion Stopped (05/21/23 0815)     LOS: 7 days    Time spent: 35 minutes.    Berton Mount, MD  [facility] Pager #: [phone] 7PM-7AM contact night coverage as above

## 2023-05-21 NOTE — Evaluation (Addendum)
Physical Therapy Evaluation & Discharge Patient Details Name: Heather Hudson MRN: [id number] DOB: October 29, 1955 Today's Date: 05/21/2023  History of Present Illness  Pt is a 67 y.o. female admitted 05/14/23 with acute bilateral PEs, BLE DVTs. Brief afib on 8/12. S/p LLE thrombectomy and IVC filter placement 8/13. PMH includes COVID-19 infection, autoimmune disease/mucocutaneous pemphigoid and remote colovesical fistula.   Clinical Impression  Patient evaluated by Physical Therapy with no further acute PT needs identified. PTA, pt independent, lives with husband, was working primarily sedentary job. Today, pt independent with mobility tasks; c/o BLE fatigue. Pt expresses concern that LLE swelling worse than pre-op; RN notified. All education has been completed and the patient has no further questions. Acute PT is signing off. Thank you for this referral.      If plan is discharge home, recommend the following: Assistance with cooking/housework;Assist for transportation   Can travel by private vehicle    Yes    Equipment Recommendations None recommended by PT  Recommendations for Other Services       Functional Status Assessment       Precautions / Restrictions Precautions Precautions: None Restrictions Weight Bearing Restrictions: No      Mobility  Bed Mobility Overal bed mobility: Modified Independent             General bed mobility comments: HOB elevated    Transfers Overall transfer level: Independent Equipment used: None                    Ambulation/Gait Ambulation/Gait assistance: Independent Gait Distance (Feet): 200 Feet Assistive device: None Gait Pattern/deviations: Step-through pattern, Decreased stride length Gait velocity: Decreased     General Gait Details: slow, steady gait independent without DME; pt self-limiting further distance secondary to c/o bilateral knee fatigue. no overt instability or LOB noted  Stairs            Wheelchair  Mobility     Tilt Bed    Modified Rankin (Stroke Patients Only)       Balance Overall balance assessment: No apparent balance deficits (not formally assessed)                                           Pertinent Vitals/Pain Pain Assessment Pain Assessment: Faces Faces Pain Scale: Hurts a little bit Pain Location: bilateral knees Pain Descriptors / Indicators: Tiring, Discomfort Pain Intervention(s): Monitored during session    Home Living Family/patient expects to be discharged to:: Private residence Living Arrangements: Spouse/significant other Available Help at Discharge: Family;Available 24 hours/day Type of Home: House Home Access: Level entry       Home Layout: One level Home Equipment: Shower seat - built in      Prior Function Prior Level of Function : Independent/Modified Independent;Working/employed;Driving             Mobility Comments: independent without DME; was working Health and safety inspector job, but work recently downsized. reports limited mobility since issues started in June ADLs Comments: independent     Extremity/Trunk Assessment   Upper Extremity Assessment Upper Extremity Assessment: Overall WFL for tasks assessed    Lower Extremity Assessment Lower Extremity Assessment:  (strength WFL, endorses intermittent 'tingling' sensation; pt reports bilateral knee swelling, especially L knee and thigh looking worse than day before sx; bilateral knee-high ted hose donned)    Cervical / Trunk Assessment Cervical / Trunk Assessment: Normal  Communication  Communication Communication: No apparent difficulties  Cognition Arousal: Alert Behavior During Therapy: WFL for tasks assessed/performed Overall Cognitive Status: Within Functional Limits for tasks assessed                                          General Comments General comments (skin integrity, edema, etc.): educ pt and husband re: POC, activity recommendations  (including ambulation and BLE AROM), edema control, importance of mobility. notified RN Heather Hudson) of pt's concern that LLE more swollen than before sx; discussed pt would likely benefit from thigh-high ted hose instead of knee-high    Exercises     Assessment/Plan    PT Assessment Patient does not need any further PT services  PT Problem List         PT Treatment Interventions      PT Goals (Current goals can be found in the Care Plan section)  Acute Rehab PT Goals PT Goal Formulation: All assessment and education complete, DC therapy    Frequency       Co-evaluation               AM-PAC PT "6 Clicks" Mobility  Outcome Measure Help needed turning from your back to your side while in a flat bed without using bedrails?: None Help needed moving from lying on your back to sitting on the side of a flat bed without using bedrails?: None Help needed moving to and from a bed to a chair (including a wheelchair)?: None Help needed standing up from a chair using your arms (e.g., wheelchair or bedside chair)?: None Help needed to walk in hospital room?: None Help needed climbing 3-5 steps with a railing? : None 6 Click Score: 24    End of Session Equipment Utilized During Treatment: Gait belt Activity Tolerance: Patient tolerated treatment well Patient left: in bed;with call bell/phone within reach Nurse Communication: Mobility status;Other (comment) (BLE swelling) PT Visit Diagnosis: Other abnormalities of gait and mobility (R26.89)    Time: 1610-9604 PT Time Calculation (min) (ACUTE ONLY): 14 min   Charges:   PT Evaluation $PT Eval Low Complexity: 1 Low   PT General Charges $$ ACUTE PT VISIT: 1 Visit       Heather Hudson, PT, DPT Acute Rehabilitation Services  Personal: Secure Chat Rehab Office: [phone]  Heather Hudson 05/21/2023, 4:38 PM

## 2023-05-21 NOTE — TOC Transition Note (Signed)
Transition of Care [facility]) - CM/SW Discharge Note   Patient Details  Name: Heather Hudson MRN: [id number] Date of Birth: [birth year]  Transition of Care [facility]) CM/SW Contact:  Lockie Pares, RN Phone Number: 05/21/2023, 5:45 PM   Clinical Narrative:     Patient likely DC in AM follow up appt with IR had IVC filter. Likely will be on DOAC Currently on heparin No needs ID  Final next level of care: Home/Self Care Barriers to Discharge: No Barriers Identified   Patient Goals and CMS Choice      Discharge Placement                         Discharge Plan and Services Additional resources added to the After Visit Summary for                                       Social Determinants of Health (SDOH) Interventions SDOH Screenings   Food Insecurity: No Food Insecurity (05/16/2023)  Housing: Low Risk  (05/14/2023)  Transportation Needs: No Transportation Needs (05/14/2023)  Utilities: Not At Risk (05/14/2023)  Alcohol Screen: Low Risk  (05/04/2023)  Depression (PHQ2-9): Low Risk  (01/20/2023)  Financial Resource Strain: Low Risk  (05/04/2023)  Physical Activity: Unknown (05/04/2023)  Social Connections: Socially Integrated (05/04/2023)  Stress: No Stress Concern Present (05/04/2023)  Tobacco Use: Low Risk  (05/18/2023)     Readmission Risk Interventions     No data to display

## 2023-05-22 LAB — BETA-2-GLYCOPROTEIN I ABS, IGG/M/A
Beta-2 Glyco I IgG: <9 GPI IgG units (ref 0–20)
Beta-2-Glycoprotein I IgA: <9 GPI IgA units (ref 0–25)
Beta-2-Glycoprotein I IgM: <9 GPI IgM units (ref 0–32)

## 2023-05-22 LAB — RENAL FUNCTION PANEL
Albumin: 2.6 g/dL — ABNORMAL LOW (ref 3.5–5.0)
Anion gap: 10 (ref 5–15)
BUN: 11 mg/dL (ref 8–23)
CO2: 19 mmol/L — ABNORMAL LOW (ref 22–32)
Calcium: 9.4 mg/dL (ref 8.9–10.3)
Chloride: 107 mmol/L (ref 98–111)
Creatinine, Ser: 0.59 mg/dL (ref 0.44–1.00)
GFR, Estimated: >60 mL/min (ref >60)
Glucose, Bld: 210 mg/dL — ABNORMAL HIGH (ref 70–99)
Phosphorus: 2.9 mg/dL (ref 2.5–4.6)
Potassium: 3.4 mmol/L — ABNORMAL LOW (ref 3.5–5.1)
Sodium: 136 mmol/L (ref 135–145)

## 2023-05-22 LAB — GLUCOSE, CAPILLARY
Glucose-Capillary: 109 mg/dL — ABNORMAL HIGH (ref 70–99)
Glucose-Capillary: 193 mg/dL — ABNORMAL HIGH (ref 70–99)

## 2023-05-22 LAB — MAGNESIUM: Magnesium: 1.9 mg/dL (ref 1.7–2.4)

## 2023-05-22 MED ORDER — FOLIC ACID 1 MG PO TABS: 1.0000 mg | ORAL_TABLET | Freq: Every day | ORAL | 1 refills | Status: DC

## 2023-05-22 MED ORDER — METFORMIN HCL 1000 MG PO TABS: 1000.0000 mg | ORAL_TABLET | Freq: Two times a day (BID) | ORAL | 1 refills | Status: DC

## 2023-05-22 MED ORDER — POTASSIUM CHLORIDE CRYS ER 20 MEQ PO TBCR
40.0000 meq | EXTENDED_RELEASE_TABLET | Freq: Once | ORAL | Status: AC
  Administered 2023-05-22: 40 meq via ORAL
  Filled 2023-05-22: qty 2

## 2023-05-22 MED ORDER — POLYETHYLENE GLYCOL 3350 17 G PO PACK: 17.0000 g | PACK | Freq: Every day | ORAL | 0 refills | Status: DC | PRN

## 2023-05-22 MED ORDER — APIXABAN 5 MG PO TABS: 5.0000 mg | ORAL_TABLET | Freq: Two times a day (BID) | ORAL | 1 refills | Status: DC

## 2023-05-22 NOTE — Discharge Summary (Signed)
Physician Discharge Summary  Patient ID: Heather Hudson MRN: [id number] DOB/AGE: 66-Jan-1957 79 y.o.  Admit date: 05/14/2023 Discharge date: 05/22/2023  Admission Diagnoses:  Discharge Diagnoses:  Principal Problem:   Acute pulmonary embolism (HCC) Active Problems:   Primary hypertension   Mucous membrane pemphigoid   Pulmonary embolism (HCC)   Hyperglycemia   Hypercalcemia   Discharged Condition: stable  Hospital Course:  Patient is a 67 year old female past medical history significant for COVID-19 infection, autoimmune disease/mucocutaneous pemphigoid and remote colovesical fistula.  Patient has been on prednisone on methotrexate.  Patient was admitted with acute pulmonary embolism and bilateral lower extremity DVT.  History of travel to [place] reported.  Patient was managed with IV heparin.  Patient will be discharged on DOAC.  Interventional radiology team was consulted for mechanical thrombectomy of extensive lower extremity DVT.  Hospital course was complicated by brief atrial fibrillation.  Patient was also seen by the hematology team.  Patient will follow-up with primary care provider MRI hematology and cardiology team on discharge  Acute pulmonary embolism (HCC) -Continue subcutaneous Lovenox.   -Transition to Eliquis orally tomorrow.   -Patient remains on room air.   -Extensive left lower extremity DVT status post thrombectomy.   -Likely discharge tomorrow. Consult physical therapy.       CTA chest revealed: 1. Positive for acute bilateral PE with CT evidence of right heart strain (RV/LV Ratio = 1.05) consistent with at least submassive (intermediate risk) PE. The presence of right heart strain has been associated with an increased risk of morbidity and mortality. Please refer to the "Code PE Focused" order set in EPIC. 2. Wedge-shaped airspace consolidation within the right middle lobe with additional smaller areas of airspace consolidation within the periphery of the  right upper lobe and superior aspect of the right lower lobe. Findings are most concerning for pulmonary infarcts. Multifocal pneumonia could also result in this appearance in the appropriate clinical setting.   Critical Value/emergent results were called by telephone at the time of interpretation on 05/14/2023 at 7:10 pm to provider Dr. Tanda Rockers , who verbally acknowledged these results.     Electronically Signed   By: Duanne Guess D.O.   On: 05/14/2023 19:10   Hypercalcemia 05/15/2023: Resolved. Suspect this is related to volume status.   Hyperglycemia This is likely been precipitated by patient being started on steroid therapy recently.  See below.  I will maintain the patient on insulin sliding scale at this time. 05/15/2023: Add Semglee.  A1c 7.6%.  Consult diabetic educator.  Patient has been on steroids. 05/20/2023: Blood sugar control is improving. 05/21/2023: Start patient on metformin 1000 Mg p.o. twice daily.   Mucous membrane pemphigoid [name], MD - 04/23/2023 8:45 AM EDT Formatting of this note is different from the original. Images from the original note were not included. Dermatology Note  05/15/2023: Hematology team is managing.   Extensive DVT left lower extremity: -Consult IR for possible mechanical thrombectomy and thrombolysis. -Will keep patient n.p.o. after midnight. 05/18/2023: For thrombectomy/thrombolysis of the left lower extremity DVT tomorrow by IR. 05/20/2023: Improvement in left lower extremity swelling.  Continue subcutaneous Lovenox.   Brief paroxysmal atrial fibrillation: -Asymptomatic. -Negative troponin. -Rate controlled. -Patient is on subcutaneous Lovenox. -Follow cardiology on discharge.   Hypokalemia: -Monitored and replaced.    Consults: hematology/oncology and interventional radiology team for mechanical thrombectomy of left lower extremity extensive DVT/IVC filter placement.  Significant Diagnostic Studies:   Treatments:   -Managed with anticoagulation. -Mechanical thrombectomy of extensive left lower  extremity DVT, IVC filter placement.  Discharge Exam: Blood pressure 134/75, pulse 82, temperature 98.1 F (36.7 C), temperature source Oral, resp. rate 20, height 5\' 8"  (1.727 m), weight 81.2 kg, SpO2 97%.   Disposition: Discharge disposition: 01-Home or Self Care       Discharge Instructions     Diet - low sodium heart healthy   Complete by: As directed    Discharge wound care:   Complete by: As directed    Continue current wound care regimen.   Increase activity slowly   Complete by: As directed       Allergies as of 05/22/2023       Reactions   Aspirin Hives   Ibuprofen Hives   Other    Anesthesia (familial) SISTER HAD MALIGNANT HYPERTHERMIA   Propofol Other (See Comments)        Medication List     STOP taking these medications    scopolamine 1 MG/3DAYS Commonly known as: TRANSDERM-SCOP       TAKE these medications    apixaban 5 MG Tabs tablet Commonly known as: Eliquis Take 1 tablet (5 mg total) by mouth 2 (two) times daily.   calcium-vitamin D 500-5 MG-MCG tablet Commonly known as: OSCAL WITH D Take 1 tablet by mouth daily with breakfast.   folic acid 1 MG tablet Commonly known as: FOLVITE Take 1 tablet (1 mg total) by mouth daily. Start taking on: May 23, 2023   losartan 25 MG tablet Commonly known as: COZAAR TAKE 1 TABLET BY MOUTH EVERY DAY FOR BLOOD PRESSURE   metFORMIN 1000 MG tablet Commonly known as: GLUCOPHAGE Take 1 tablet (1,000 mg total) by mouth 2 (two) times daily with a meal.   methotrexate 2.5 MG tablet Commonly known as: RHEUMATREX Take 10 mg by mouth once a week.   omeprazole 20 MG capsule Commonly known as: PRILOSEC Take 20 mg by mouth daily.   polyethylene glycol 17 g packet Commonly known as: MIRALAX / GLYCOLAX Take 17 g by mouth daily as needed for mild constipation.   predniSONE 10 MG tablet Commonly known as:  DELTASONE Take 25 mg by mouth daily in the afternoon.   Vitamin D3 50 MCG (2000 UT) capsule Take 2,000 Units by mouth daily.               Discharge Care Instructions  (From admission, onward)           Start     Ordered   05/22/23 0000  Discharge wound care:       Comments: Continue current wound care regimen.   05/22/23 1324            Follow-up Information     Suttle, Thressa Sheller, MD Follow up in 4 week(s).   Specialties: Interventional Radiology, Diagnostic Radiology, Radiology Why: pt will hear from IR outpt scheduler for time and date of follow up with Dr Elby Showers; call [phone] if any questions Contact information: [address] [place] [address] [phone]         [name], NP. Schedule an appointment as soon as possible for a visit.   Specialty: Internal Medicine Why: Follow up hospital visit 7-14 days Contact information: [address] [place] [address] [phone]                 Time spent: 35 minutes.  SignedBarnetta Chapel 05/22/2023, 1:24 PM

## 2023-05-22 NOTE — Inpatient Diabetes Management (Addendum)
Inpatient Diabetes Program Recommendations  AACE/ADA: New Consensus Statement on Inpatient Glycemic Control  Target Ranges:  Prepandial:   less than 140 mg/dL      Peak postprandial:   less than 180 mg/dL (1-2 hours)      Critically ill patients:  140 - 180 mg/dL    Latest Reference Range & Units 05/21/23 06:07 05/21/23 11:17 05/21/23 16:33 05/21/23 20:43 05/22/23 06:08  Glucose-Capillary 70 - 99 mg/dL 272 (H) 536 (H) 644 (H) 162 (H) 109 (H)    Latest Reference Range & Units 01/16/22 09:06 05/15/23 01:46  Hemoglobin A1C 4.8 - 5.6 % 5.1 7.6 (H)   Review of Glycemic Control  Diabetes history: No; new DM dx Outpatient Diabetes medications: NA Current orders for Inpatient glycemic control: Semglee 10 units daily, Novolog 0-15 units TID with meals, Novolog 0-5 units at bedtime; Prednisone 20 mg QAM   Inpatient Diabetes Program Recommendations:     HbgA1C:  A1C 7.6% on 05/15/23 indicating an average glucose of 171 mg/dl over the past 2-3 months. Note patient has been on steroids since 01/15/23 (initially on Prednisone 100 mg daily) which is contributing to elevated A1C. Would recommend patient has A1C repeated in 3-6 months.    DM medication: If patient is discharged on DM medication, please consider starting on Metformin 500 mg BID at this time.   Outpatient DM: At discharge, please provide Rx for glucose monitoring kit (#[id number]).   NOTE: Inpatient diabetes coordinator spoke with patient on 05/18/23 regarding new steroids induced diabetes. Will sign off consult; please reconsult if needed.   Thanks, Orlando Penner, RN, MSN, CDCES Diabetes Coordinator Inpatient Diabetes Program [phone] (Team Pager from 8am to 5pm)

## 2023-05-23 LAB — LUPUS ANTICOAGULANT PANEL
DRVVT: 41.9 s (ref 0.0–47.0)
PTT Lupus Anticoagulant: 51.4 s — ABNORMAL HIGH (ref 0.0–43.5)

## 2023-05-23 LAB — PROTEIN C, TOTAL: Protein C, Total: 81 % (ref 60–150)

## 2023-05-23 LAB — PROTEIN S, TOTAL: Protein S Ag, Total: 89 % (ref 60–150)

## 2023-05-23 LAB — CARDIOLIPIN ANTIBODIES, IGG, IGM, IGA
Anticardiolipin IgA: <9 U/mL (ref 0–11)
Anticardiolipin IgG: <9 GPL U/mL (ref 0–14)
Anticardiolipin IgM: 18 [MPL'U]/mL — ABNORMAL HIGH (ref 0–12)

## 2023-05-23 LAB — HEXAGONAL PHASE PHOSPHOLIPID: Hexagonal Phase Phospholipid: 4 s (ref 0–11)

## 2023-05-23 LAB — PTT-LA MIX: PTT-LA Mix: 48 s — ABNORMAL HIGH (ref 0.0–40.5)

## 2023-05-25 ENCOUNTER — Telehealth: Payer: Self-pay | Admitting: *Deleted

## 2023-05-25 NOTE — Transitions of Care (Post Inpatient/ED Visit) (Signed)
05/25/2023  Name: Heather Hudson MRN: [id number] DOB: 1955-10-08  Today's TOC FU Call Status: Today's TOC FU Call Status:: Successful TOC FU Call Completed TOC FU Call Complete Date: 05/25/23  Transition Care Management Follow-up Telephone Call Date of Discharge: 05/22/23 Discharge Facility: [facility]) Type of Discharge: Inpatient Admission Primary Inpatient Discharge Diagnosis:: Acute Pulmonary emboolism How have you been since you were released from the hospital?: Better (Feeling good just a little tired)  Items Reviewed: Did you receive and understand the discharge instructions provided?: Yes Medications obtained,verified, and reconciled?: Yes (Medications Reviewed) Any new allergies since your discharge?: No Do you have support at home?: Yes People in Home: spouse Name of Support/Comfort Primary Source: Molly Maduro  Medications Reviewed Today: Medications Reviewed Today     Reviewed by Luella Cook, RN (Case Manager) on 05/25/23 at 1106  Med List Status: <None>   Medication Order Taking? Sig Documenting Provider Last Dose Status Informant  apixaban (ELIQUIS) 5 MG TABS tablet [id number] Yes Take 1 tablet (5 mg total) by mouth 2 (two) times daily. Barnetta Chapel, MD Taking Active   calcium-vitamin D Ruthell Rummage WITH D) 500-5 MG-MCG tablet [id number] Yes Take 1 tablet by mouth daily with breakfast. [provider] Taking Active   Cholecalciferol (VITAMIN D3) 50 MCG (2000 UT) capsule [id number] Yes Take 2,000 Units by mouth daily. [provider] Taking Active   folic acid (FOLVITE) 1 MG tablet [id number] Yes Take 1 tablet (1 mg total) by mouth daily. Barnetta Chapel, MD Taking Active   losartan (COZAAR) 25 MG tablet [id number] Yes TAKE 1 TABLET BY MOUTH EVERY DAY FOR BLOOD PRESSURE [name], NP Taking Active Self, Pharmacy Records  metFORMIN (GLUCOPHAGE) 1000 MG tablet [id number] Yes Take 1 tablet (1,000 mg total) by mouth 2 (two) times daily with a  meal. Barnetta Chapel, MD Taking Active   methotrexate (RHEUMATREX) 2.5 MG tablet [id number] Yes Take 10 mg by mouth once a week. [provider] Taking Active Self, Pharmacy Records  omeprazole (PRILOSEC) 20 MG capsule [id number] Yes Take 20 mg by mouth daily. [provider] Taking Active   polyethylene glycol (MIRALAX / GLYCOLAX) 17 g packet [id number] Yes Take 17 g by mouth daily as needed for mild constipation. Barnetta Chapel, MD Taking Active   predniSONE (DELTASONE) 10 MG tablet [id number] Yes Take 25 mg by mouth daily in the afternoon. [provider] Taking Active Self, Pharmacy Records            Home Care and Equipment/Supplies: Were Home Health Services Ordered?: NA Any new equipment or medical supplies ordered?: NA  Functional Questionnaire: Do you need assistance with bathing/showering or dressing?: Yes Do you need assistance with meal preparation?: Yes Do you need assistance with eating?: No Do you have difficulty maintaining continence: No Do you need assistance with getting out of bed/getting out of a chair/moving?: No Do you have difficulty managing or taking your medications?: No  Follow up appointments reviewed: PCP Follow-up appointment confirmed?: Yes Date of PCP follow-up appointment?: 05/29/23 Follow-up Provider: Higinio Plan Follow-up appointment confirmed?: No Reason Specialist Follow-Up Not Confirmed: Patient has Specialist Provider Number and will Call for Appointment (Intervention radiology will call date and time.) Do you need transportation to your follow-up appointment?: No Do you understand care options if your condition(s) worsen?: Yes-patient verbalized understanding  SDOH Interventions Today    Flowsheet Row Most Recent Value  SDOH Interventions   Food Insecurity Interventions Intervention Not Indicated  Housing  Interventions Intervention Not Indicated  Transportation Interventions  Intervention Not Indicated      Interventions Today    Flowsheet Row Most Recent Value  General Interventions   General Interventions Discussed/Reviewed General Interventions Discussed, General Interventions Reviewed, Doctor Visits  Doctor Visits Discussed/Reviewed Doctor Visits Discussed, Doctor Visits Reviewed  Pharmacy Interventions   Pharmacy Dicussed/Reviewed Pharmacy Topics Discussed, Pharmacy Topics Reviewed      . TOC Interventions Today    Flowsheet Row Most Recent Value  TOC Interventions   TOC Interventions Discussed/Reviewed Arranged PCP follow up within 7 days/Care Guide scheduled, TOC Interventions Discussed, TOC Interventions Reviewed       Gean Maidens BSN RN [facility] [phone]

## 2023-05-25 NOTE — Transitions of Care (Post Inpatient/ED Visit) (Signed)
   05/25/2023  Name: Heather Hudson MRN: [id number] DOB: Jul 05, 1956  Today's TOC FU Call Status: Today's TOC FU Call Status:: Unsuccessful Call (1st Attempt) Unsuccessful Call (1st Attempt) Date: 05/25/23  Attempted to reach the patient regarding the most recent Inpatient/ED visit.  Follow Up Plan: Additional outreach attempts will be made to reach the patient to complete the Transitions of Care (Post Inpatient/ED visit) call.   Gean Maidens BSN RN [facility] [phone]

## 2023-05-27 ENCOUNTER — Telehealth: Payer: Self-pay | Admitting: Hematology and Oncology

## 2023-05-27 LAB — FACTOR 5 LEIDEN

## 2023-05-27 NOTE — Telephone Encounter (Signed)
Scheduled appointment per staff message. Left voicemail with appointment details.

## 2023-05-28 LAB — PROTHROMBIN GENE MUTATION

## 2023-05-29 ENCOUNTER — Other Ambulatory Visit: Payer: Self-pay | Admitting: Primary Care

## 2023-05-29 ENCOUNTER — Encounter: Payer: Self-pay | Admitting: Nurse Practitioner

## 2023-05-29 ENCOUNTER — Ambulatory Visit (INDEPENDENT_AMBULATORY_CARE_PROVIDER_SITE_OTHER): Payer: PPO | Admitting: Nurse Practitioner

## 2023-05-29 VITALS — BP 118/80 | HR 83 | Temp 98.1°F | Ht 68.0 in | Wt 177.6 lb

## 2023-05-29 DIAGNOSIS — E1165 Type 2 diabetes mellitus with hyperglycemia: Secondary | ICD-10-CM

## 2023-05-29 DIAGNOSIS — Z09 Encounter for follow-up examination after completed treatment for conditions other than malignant neoplasm: Secondary | ICD-10-CM | POA: Insufficient documentation

## 2023-05-29 DIAGNOSIS — Z7984 Long term (current) use of oral hypoglycemic drugs: Secondary | ICD-10-CM | POA: Diagnosis not present

## 2023-05-29 DIAGNOSIS — I2609 Other pulmonary embolism with acute cor pulmonale: Secondary | ICD-10-CM

## 2023-05-29 MED ORDER — METFORMIN HCL ER 500 MG PO TB24: 1000.0000 mg | ORAL_TABLET | Freq: Two times a day (BID) | ORAL | 0 refills | Status: DC

## 2023-05-29 NOTE — Assessment & Plan Note (Signed)
Patient's A1c was 7.6% in the hospital.  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  Patient was placed on metformin 1000 mg twice daily patient is not tolerating it well with nausea and stomach upset we will switch her to metformin 500 mg XR 2 tablets 2 times a day.  Did discuss this with patient.

## 2023-05-29 NOTE — Patient Instructions (Signed)
Nice to see you today Follow up with kate in 2.5 months for an A1C recheck. Keep appointments with the specialists Follow up here sooner if you need [place]

## 2023-05-29 NOTE — Telephone Encounter (Signed)
These are prescribed by dermatology through [facility].

## 2023-05-29 NOTE — Assessment & Plan Note (Signed)
Did review ED note and most recent hospitalist note.  Did review labs along with imaging.

## 2023-05-29 NOTE — Assessment & Plan Note (Signed)
Acute diagnoses in the hospital.  Patient was on heparin and then transition to oral apixaban.  She did have a hyper coagulable panel done and has a follow-up with Dr. Pamelia Hoit hematology.  Continue Eliquis as prescribed continue following up with hematology as recommended

## 2023-05-29 NOTE — Progress Notes (Signed)
Acute Office Visit  Subjective:     Patient ID: Heather Hudson, female    DOB: 03-Jun-1956, 67 y.o.   MRN: [id number]  Chief Complaint  Patient presents with   Hospitalization Follow-up    Pt complains of feeling weak and nauseas. States he feels better than she did at hospital.     HPI Patient is in today for hosptial follow up   Patient was admitted to the hospital on 05/14/2023 and discharged on 05/22/2023.  Patient states that she had dyspnea and fatigue after coming back from a trip from [place] worsen dyspnea on exertion now unable to ambulate more than a few feet without becoming winded.  Patient underwent labs of the CT angio that had an extensive pulmonary emboli involving both main pulmonary arteries right greater than left.  No saddle embolism.  Mildly elevated R.  To LV ratio heart size was normal.  Further workup was done and patient was noted to have bilateral lower extremity DVTs underwent IR procedure for vein thrombectomy and IVC filter placement.  Patient is currently anticoagulated on Eliquis 5 mg twice daily.  Is here for follow-up  States that she is having some weakness in her lower extremties. States that she is having some swelling on the rankle and be weak. States that her lower body. States that in July she noticed more lower extremity weakness   Review of Systems  Constitutional:  Positive for malaise/fatigue. Negative for chills and fever.  Respiratory:  Positive for shortness of breath (improving).   Cardiovascular:  Negative for chest pain.  Gastrointestinal:  Positive for nausea. Negative for abdominal pain and vomiting.  Neurological:  Positive for weakness (global).        Objective:    BP 118/80   Pulse 83   Temp 98.1 F (36.7 C) (Temporal)   Ht 5\' 8"  (1.727 m)   Wt 177 lb 9.6 oz (80.6 kg)   SpO2 98%   BMI 27.00 kg/m    Physical Exam Vitals and nursing note reviewed.  Constitutional:      Appearance: Normal appearance.  Cardiovascular:      Rate and Rhythm: Normal rate and regular rhythm.     Heart sounds: Normal heart sounds.  Pulmonary:     Effort: Pulmonary effort is normal.     Breath sounds: Normal breath sounds.  Musculoskeletal:        General: Tenderness present.     Left lower leg: Edema present.       Legs:  Neurological:     Mental Status: She is alert.     No results found for any visits on 05/29/23.      Assessment & Plan:   Problem List Items Addressed This Visit       Cardiovascular and Mediastinum   Acute pulmonary embolism (HCC)    Acute diagnoses in the hospital.  Patient was on heparin and then transition to oral apixaban.  She did have a hyper coagulable panel done and has a follow-up with Dr. Pamelia Hoit hematology.  Continue Eliquis as prescribed continue following up with hematology as recommended        Endocrine   Type 2 diabetes mellitus with hyperglycemia, without long-term current use of insulin (HCC)    Patient's A1c was 7.6% in the hospital.  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  Patient was placed on metformin 1000 mg twice daily patient is not tolerating it well with nausea and stomach upset we will  switch her to metformin 500 mg XR 2 tablets 2 times a day.  Did discuss this with patient.      Relevant Medications   metFORMIN (GLUCOPHAGE-XR) 500 MG 24 hr tablet     Other   Hospital discharge follow-up - Primary    Did review ED note and most recent hospitalist note.  Did review labs along with imaging.       Meds ordered this encounter  Medications   metFORMIN (GLUCOPHAGE-XR) 500 MG 24 hr tablet    Sig: Take 2 tablets (1,000 mg total) by mouth 2 (two) times daily with a meal.    Dispense:  360 tablet    Refill:  0    Order Specific Question:   Supervising Provider    Answer:   Roxy Manns A [[id number]]    Return in about 3 months (around 08/16/2023) for DM recheck.  Audria Nine, NP

## 2023-06-03 ENCOUNTER — Inpatient Hospital Stay: Payer: PPO | Attending: Hematology and Oncology | Admitting: Hematology and Oncology

## 2023-06-03 VITALS — BP 127/73 | HR 81 | Temp 97.8°F | Resp 18 | Ht 68.0 in | Wt 180.3 lb

## 2023-06-03 DIAGNOSIS — I82403 Acute embolism and thrombosis of unspecified deep veins of lower extremity, bilateral: Secondary | ICD-10-CM | POA: Diagnosis not present

## 2023-06-03 DIAGNOSIS — I2694 Multiple subsegmental pulmonary emboli without acute cor pulmonale: Secondary | ICD-10-CM | POA: Diagnosis present

## 2023-06-03 DIAGNOSIS — Z8616 Personal history of COVID-19: Secondary | ICD-10-CM | POA: Diagnosis not present

## 2023-06-03 DIAGNOSIS — I2699 Other pulmonary embolism without acute cor pulmonale: Secondary | ICD-10-CM | POA: Diagnosis not present

## 2023-06-03 DIAGNOSIS — Z7952 Long term (current) use of systemic steroids: Secondary | ICD-10-CM | POA: Insufficient documentation

## 2023-06-03 DIAGNOSIS — Z7901 Long term (current) use of anticoagulants: Secondary | ICD-10-CM | POA: Insufficient documentation

## 2023-06-03 NOTE — Assessment & Plan Note (Signed)
Acute pulmonary embolism and bilateral lower extremity DVT: 05/14/2023: Acute bilateral PE involving both main pulmonary arteries, numerous lobar segmental subsegmental arteries with right heart strain right middle lobe wedge-shaped.  consolidation: Infarcts   No clear-cut predisposing factors. (Prior history of COVID-19, autoimmune/mucocutaneous pemphigoid, on prednisone and methotrexate) Status post thrombectomy and thrombolysis  Hypercoagulability workup: Negative for factor V Leiden gene mutation, normal protein C protein S and Antithrombin levels.  IgM anticardiolipin antibody: 18 (indeterminate) Lupus anticoagulant: Negative  Duration of anticoagulation: 6 months to 1 year

## 2023-06-03 NOTE — Progress Notes (Signed)
Patient Care Team: [name], NP as PCP - General (Internal Medicine)  DIAGNOSIS:  Encounter Diagnosis  Name Primary?   Other acute pulmonary embolism without acute cor pulmonale (HCC) Yes      CHIEF COMPLIANT: Follow-up PE and DVT/ Review labs  INTERVAL HISTORY: Heather Hudson is a 67 y.o female with the above mentioned. She presents to the clinic for a follow-up to review labs. Patient denies any bleeding. States that she had some mild bleeding from the nose.   ALLERGIES:  is allergic to aspirin, ibuprofen, other, and propofol.  MEDICATIONS:  Current Outpatient Medications  Medication Sig Dispense Refill   apixaban (ELIQUIS) 5 MG TABS tablet Take 1 tablet (5 mg total) by mouth 2 (two) times daily. 60 tablet 1   calcium-vitamin D (OSCAL WITH D) 500-5 MG-MCG tablet Take 1 tablet by mouth daily with breakfast.     Cholecalciferol (VITAMIN D3) 50 MCG (2000 UT) capsule Take 2,000 Units by mouth daily.     folic acid (FOLVITE) 1 MG tablet Take 1 tablet (1 mg total) by mouth daily. 30 tablet 1   losartan (COZAAR) 25 MG tablet TAKE 1 TABLET BY MOUTH EVERY DAY FOR BLOOD PRESSURE 90 tablet 3   metFORMIN (GLUCOPHAGE-XR) 500 MG 24 hr tablet Take 2 tablets (1,000 mg total) by mouth 2 (two) times daily with a meal. 360 tablet 0   methotrexate (RHEUMATREX) 2.5 MG tablet Take 10 mg by mouth once a week.     omeprazole (PRILOSEC) 20 MG capsule Take 20 mg by mouth daily. (Patient not taking: Reported on 05/29/2023)     polyethylene glycol (MIRALAX / GLYCOLAX) 17 g packet Take 17 g by mouth daily as needed for mild constipation. 14 each 0   predniSONE (DELTASONE) 10 MG tablet Take 15 mg by mouth daily in the afternoon.     No current facility-administered medications for this visit.    PHYSICAL EXAMINATION: ECOG PERFORMANCE STATUS: 1 - Symptomatic but completely ambulatory  Vitals:   06/03/23 0805  BP: 127/73  Pulse: 81  Resp: 18  Temp: 97.8 F (36.6 C)  SpO2: 100%   Filed  Weights   06/03/23 0805  Weight: 180 lb 4.8 oz (81.8 kg)     LABORATORY DATA:  I have reviewed the data as listed    Latest Ref Rng & Units 05/22/2023   11:04 AM 05/21/2023    6:45 AM 05/20/2023   12:45 PM  CMP  Glucose 70 - 99 mg/dL 409  811  914   BUN 8 - 23 mg/dL 11  7  7    Creatinine 0.44 - 1.00 mg/dL 7.82  9.56  2.13   Sodium 135 - 145 mmol/L 136  138  139   Potassium 3.5 - 5.1 mmol/L 3.4  2.8  3.4   Chloride 98 - 111 mmol/L 107  110  107   CO2 22 - 32 mmol/L 19  20  20    Calcium 8.9 - 10.3 mg/dL 9.4  9.1  9.5     Lab Results  Component Value Date   WBC 6.1 05/21/2023   HGB 8.7 (L) 05/21/2023   HCT 25.5 (L) 05/21/2023   MCV 95.9 05/21/2023   PLT 265 05/21/2023   NEUTROABS 5.1 05/21/2023    ASSESSMENT & PLAN:  Acute pulmonary embolism (HCC) Acute pulmonary embolism and bilateral lower extremity DVT: 05/14/2023: Acute bilateral PE involving both main pulmonary arteries, numerous lobar segmental subsegmental arteries with right heart strain right middle lobe wedge-shaped.  consolidation: Infarcts   No clear-cut predisposing factors. (Prior history of COVID-19, autoimmune/mucocutaneous pemphigoid, on prednisone and methotrexate) Status post thrombectomy and thrombolysis  Hypercoagulability workup: Negative for factor V Leiden gene mutation, normal protein C protein S and Antithrombin levels.  IgM anticardiolipin antibody: 18 (indeterminate) Lupus anticoagulant: Negative  Duration of anticoagulation: 6 months to 1 year In February 2025 we will obtain a CT chest angiogram and a D-dimer along with cardiolipin antibody If the above tests are negative then she can discontinue anticoagulation. I instructed her to follow-up with vascular surgery regarding the frequency of lower extremity ultrasounds.   Orders Placed This Encounter  Procedures   CT Angio Chest Pulmonary Embolism (PE) W or WO Contrast    No contrast    Standing Status:   Future    Standing Expiration Date:    06/02/2024    Order Specific Question:   If indicated for the ordered procedure, I authorize the administration of contrast media per Radiology protocol    Answer:   Yes    Order Specific Question:   Does the patient have a contrast media/X-ray dye allergy?    Answer:   No    Order Specific Question:   Preferred imaging location?    Answer:   [facility]   D-dimer, quantitative    Standing Status:   Future    Standing Expiration Date:   06/02/2024   Basic Metabolic Panel - [facility] Only    Standing Status:   Future    Standing Expiration Date:   06/02/2024   Cardiolipin antibodies, IgG, IgM, IgA*    Standing Status:   Future    Standing Expiration Date:   06/02/2024   The patient has a good understanding of the overall plan. she agrees with it. she will call with any problems that may develop before the next visit here. Total time spent: 30 mins including face to face time and time spent for planning, charting and co-ordination of care   Tamsen Meek, MD 06/03/23    I Janan Ridge am acting as a Neurosurgeon for [employer]  I have reviewed the above documentation for accuracy and completeness, and I agree with the above.

## 2023-06-10 ENCOUNTER — Telehealth: Payer: Self-pay | Admitting: Hematology and Oncology

## 2023-06-10 NOTE — Telephone Encounter (Signed)
 Scheduled appointment per los. Left voicemail with appointment details.

## 2023-06-12 ENCOUNTER — Other Ambulatory Visit: Payer: Self-pay | Admitting: Interventional Radiology

## 2023-06-12 ENCOUNTER — Ambulatory Visit (INDEPENDENT_AMBULATORY_CARE_PROVIDER_SITE_OTHER): Payer: PPO | Admitting: Podiatry

## 2023-06-12 DIAGNOSIS — I2609 Other pulmonary embolism with acute cor pulmonale: Secondary | ICD-10-CM

## 2023-06-12 DIAGNOSIS — M7672 Peroneal tendinitis, left leg: Secondary | ICD-10-CM

## 2023-06-12 NOTE — Progress Notes (Signed)
Subjective:  Patient ID: Heather Hudson, female    DOB: [birth year],  MRN: [id number]  Chief Complaint  Patient presents with   Numbness    67 y.o. female presents with the above complaint.  Patient presents with complaint of left lateral foot pain.  She is she started noticing the pain come back again.  She states it just came out of nowhere.  She denies any other acute complaints.  She has a history of peroneal tendon repair done by me.   Review of Systems: Negative except as noted in the HPI. Denies N/V/F/Ch.  Past Medical History:  Diagnosis Date   Adenomatous colon polyp 2015   Allergy 1974   teenager, and 2008   Colovesical fistula 2015   COVID-19 06/2020   Environmental and seasonal allergies    Hx of adenomatous colonic polyps 01/19/2021   Hypertension 11/23   Low dose prescription   Lower abdominal pain 12/23/2019   Migraines    Motion sickness    Persistent cough for 3 weeks or longer 10/10/2021    Current Outpatient Medications:    apixaban (ELIQUIS) 5 MG TABS tablet, Take 1 tablet (5 mg total) by mouth 2 (two) times daily., Disp: 60 tablet, Rfl: 1   calcium-vitamin D (OSCAL WITH D) 500-5 MG-MCG tablet, Take 1 tablet by mouth daily with breakfast., Disp: , Rfl:    Cholecalciferol (VITAMIN D3) 50 MCG (2000 UT) capsule, Take 2,000 Units by mouth daily., Disp: , Rfl:    folic acid (FOLVITE) 1 MG tablet, Take 1 tablet (1 mg total) by mouth daily., Disp: 30 tablet, Rfl: 1   losartan (COZAAR) 25 MG tablet, TAKE 1 TABLET BY MOUTH EVERY DAY FOR BLOOD PRESSURE, Disp: 90 tablet, Rfl: 3   metFORMIN (GLUCOPHAGE-XR) 500 MG 24 hr tablet, Take 2 tablets (1,000 mg total) by mouth 2 (two) times daily with a meal., Disp: 360 tablet, Rfl: 0   methotrexate (RHEUMATREX) 2.5 MG tablet, Take 10 mg by mouth once a week., Disp: , Rfl:    omeprazole (PRILOSEC) 20 MG capsule, Take 20 mg by mouth daily. (Patient not taking: Reported on 05/29/2023), Disp: , Rfl:    polyethylene glycol (MIRALAX /  GLYCOLAX) 17 g packet, Take 17 g by mouth daily as needed for mild constipation., Disp: 14 each, Rfl: 0   predniSONE (DELTASONE) 10 MG tablet, Take 15 mg by mouth daily in the afternoon., Disp: , Rfl:   Social History   Tobacco Use  Smoking Status Never  Smokeless Tobacco Never  Tobacco Comments   Never smoked    Allergies  Allergen Reactions   Aspirin Hives   Ibuprofen Hives   Other     Anesthesia (familial) SISTER HAD MALIGNANT HYPERTHERMIA   Propofol Other (See Comments)   Objective:  There were no vitals filed for this visit. There is no height or weight on file to calculate BMI. Constitutional Well developed. Well nourished.  Vascular Dorsalis pedis pulses palpable bilaterally. Posterior tibial pulses palpable bilaterally. Capillary refill normal to all digits.  No cyanosis or clubbing noted. Pedal hair growth normal.  Neurologic Normal speech. Oriented to person, place, and time. Epicritic sensation to light touch grossly present bilaterally.  Dermatologic Nails well groomed and normal in appearance. No open wounds. No skin lesions.  Orthopedic: Pain on palpation left lateral foot.  Pain at the insertion of the peroneal tendon.  Pain with resisted dorsiflexion eversion of the foot.  Some pain along the course of the metatarsal base as well.  No  extensor or flexor tendinitis clinically appreciated   Radiographs: 3 views of skeletally mature adult left foot: No stress fracture noted no bony abnormalities noted.  Mild midfoot arthritis noted.  Hallux limitus/arthritic changes noted to the first metatarsophalangeal joint Assessment:   No diagnosis found.  Plan:  Patient was evaluated and treated and all questions answered.  Left peroneal tendinitis with a history of peroneal tendon repair -All questions and concerns were discussed with the patient in extensive detail -Clinically given the amount of pain that she is experiencing I believe she will benefit from cam  boot immobilization to allow the soft tissue inflammation to decrease.  I discussed with the patient she states understanding -She already has cam boot for which she will place herself in it -If there is no improvement we will discuss steroid injection   No follow-ups on file.

## 2023-06-18 ENCOUNTER — Other Ambulatory Visit: Payer: Self-pay | Admitting: Nurse Practitioner

## 2023-06-18 DIAGNOSIS — E1165 Type 2 diabetes mellitus with hyperglycemia: Secondary | ICD-10-CM

## 2023-06-19 ENCOUNTER — Ambulatory Visit: Payer: PPO | Admitting: Podiatry

## 2023-06-23 ENCOUNTER — Ambulatory Visit (HOSPITAL_COMMUNITY)
Admission: RE | Admit: 2023-06-23 | Discharge: 2023-06-23 | Disposition: A | Discharge status: Home / Self Care | Payer: PPO | Source: Ambulatory Visit | Attending: Interventional Radiology | Admitting: Interventional Radiology

## 2023-06-23 ENCOUNTER — Ambulatory Visit
Admission: RE | Admit: 2023-06-23 | Discharge: 2023-06-23 | Disposition: A | Discharge status: Home / Self Care | Payer: PPO | Source: Ambulatory Visit | Attending: Interventional Radiology

## 2023-06-23 DIAGNOSIS — I2609 Other pulmonary embolism with acute cor pulmonale: Secondary | ICD-10-CM | POA: Insufficient documentation

## 2023-06-23 MED ORDER — SODIUM CHLORIDE (PF) 0.9 % IJ SOLN
INTRAMUSCULAR | Status: AC
  Filled 2023-06-23: qty 50

## 2023-06-23 MED ORDER — IOHEXOL 350 MG/ML SOLN
100.0000 mL | Freq: Once | INTRAVENOUS | Status: AC | PRN
  Administered 2023-06-23: 100 mL via INTRAVENOUS

## 2023-06-29 NOTE — Progress Notes (Signed)
Referring Physician(s): Dr. Berton Mount, Dr. Serena Croissant  Reason for follow up:  The patient is seen in virtual follow up today s/p IVC filter placement, thrombectomy/angioplasty of left iliac and femoral veins.   History of present illness: Heather Hudson is a 67 year old female with a medical history significant for HTN, colovesical fistula s/p repair and mucous membrane pemphigoid. She presented to the ED in early August with worsening dyspnea and fatigue that had started after she returned from a trip to [place] in late June. CTA chest showed bilateral pulmonary emboli involving the right main pulmonary artery and numerous lobar, segmental and subsegmental pulmonary arteries bilaterally. She was started in a heparin infusion while undergoing additional work up. A lower extremity ultrasound was positive for a left lower extremity DVT. She was managed conservatively for a few days until she developed worsening swelling of the left lower extremity from the toes to thigh. IR was consulted and our team requested a CT venogram abdomen/pelvis for further evaluation. This showed an acute appearing thrombus extending from the left common femoral vein to the junction of the left common iliac vein and IVC with concern for May-Thurner morphology.   On 05/19/23 I performed a left lower extremity venogram, inferior venocavogram, IVC filter placement and left iliofemoral extremity aspiration thrombectomy with balloon angioplasty of left iliofemoral veins. I also placed a stent from the left common through left external iliac veins.  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   She was discharged from the hospital 05/22/23 on Eliquis and was scheduled for outpatient follow up with IR including new imaging. She presents today for follow up via  virtual tele-visit.   States she's feeling well and has been very active since the procedure.  No pain in the left leg. She has ankle pain which is attributed to a podiatric issue, prior tendon surgery.  No redness or heaviness.  No firmness or skin changes.  Is wearing compression stocking on left leg.  She continues to take eliquis, no missed doses.  Non smoker.     Past Medical History:  Diagnosis Date   Adenomatous colon polyp 2015   Allergy 1974   teenager, and 2008   Colovesical fistula 2015   COVID-19 06/2020   Environmental and seasonal allergies    Hx of adenomatous colonic polyps 01/19/2021   Hypertension 11/23   Low dose prescription   Lower abdominal pain 12/23/2019   Migraines    Motion sickness    Persistent cough for 3 weeks or longer 10/10/2021    Past Surgical History:  Procedure Laterality Date   COLON SURGERY  2016   fistula, small section removed   COLONOSCOPY  2015   Plus others   IR INTRAVASCULAR ULTRASOUND NON CORONARY  05/19/2023   IR IVC FILTER PLMT / S&I /IMG GUID/MOD SED  05/19/2023   IR PTA VENOUS EXCEPT DIALYSIS CIRCUIT  05/19/2023   IR THROMBECT VENO MECH MOD SED  05/19/2023   IR TRANSCATH PLC STENT 1ST ART NOT LE CV CAR VERT CAR  05/19/2023   IR US GUIDE VASC ACCESS LEFT  05/19/2023   IR US GUIDE VASC ACCESS RIGHT  05/19/2023   IR VENO/EXT/UNI LEFT  05/19/2023   IR VENOCAVAGRAM IVC  05/19/2023   LAPAROSCOPIC LEFT COLON RESECTION  2015   Colovesical fistula, [place]   Left peroneal tendon repair Left  2023   WISDOM TOOTH EXTRACTION      Allergies: Aspirin, Ibuprofen, Other, and Propofol  Medications: Prior to Admission medications   Medication Sig Start Date End Date Taking? Authorizing Provider  apixaban (ELIQUIS) 5 MG TABS tablet Take 1 tablet (5 mg total) by mouth 2 (two) times daily. 05/22/23   Barnetta Chapel, MD  calcium-vitamin D (OSCAL WITH D) 500-5 MG-MCG tablet Take 1 tablet by mouth daily with breakfast.    [provider]   Cholecalciferol (VITAMIN D3) 50 MCG (2000 UT) capsule Take 2,000 Units by mouth daily.    [provider]  folic acid (FOLVITE) 1 MG tablet Take 1 tablet (1 mg total) by mouth daily. 05/23/23   Barnetta Chapel, MD  losartan (COZAAR) 25 MG tablet TAKE 1 TABLET BY MOUTH EVERY DAY FOR BLOOD PRESSURE 01/21/23   [name], NP  metFORMIN (GLUCOPHAGE-XR) 500 MG 24 hr tablet Take 2 tablets (1,000 mg total) by mouth 2 (two) times daily with a meal. 05/29/23   Eden Emms, NP  methotrexate (RHEUMATREX) 2.5 MG tablet Take 10 mg by mouth once a week. 04/28/23   [provider]  omeprazole (PRILOSEC) 20 MG capsule Take 20 mg by mouth daily. Patient not taking: Reported on 05/29/2023    [provider]  polyethylene glycol (MIRALAX / GLYCOLAX) 17 g packet Take 17 g by mouth daily as needed for mild constipation. 05/22/23   Barnetta Chapel, MD  predniSONE (DELTASONE) 10 MG tablet Take 15 mg by mouth daily in the afternoon. 01/14/23   [provider]     Family History  Problem Relation Age of Onset   Heart disease Mother    Diabetes Mother    Hearing loss Mother    Heart disease Father    Prostate cancer Father    Alcohol abuse Father    Cancer Father    COPD Father    Malignant hyperthermia Sister    Pancreatic cancer Maternal Aunt    Diabetes Brother    Cancer Maternal Grandmother    Miscarriages / Stillbirths Maternal Grandmother    Diabetes Brother    Intellectual disability Brother    Learning disabilities Brother    Vision loss Brother    Miscarriages / Stillbirths Sister    Miscarriages / [place] Sister    Colon cancer Neg Hx    Esophageal cancer Neg Hx    Stomach cancer Neg Hx    Liver disease Neg Hx    Breast cancer Neg Hx     Social History   Socioeconomic History   Marital status: Married    Spouse name: Not on file   Number of children: Not on file   Years of education: Not on file   Highest education level: Associate  degree: occupational, Scientist, product/process development, or vocational program  Occupational History   Not on file  Tobacco Use   Smoking status: Never   Smokeless tobacco: Never   Tobacco comments:    Never smoked  Vaping Use   Vaping status: Never Used  Substance and Sexual Activity   Alcohol use: Yes    Alcohol/week: 5.0 standard drinks of alcohol    Types: 5 Glasses of wine per week   Drug use: Never   Sexual activity: Yes    Birth control/protection: Post-menopausal  Other Topics Concern   Not on file  Social History Narrative   Married.  No children.   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   Enjoys wine tasting.    1-2 caffeinated drinks a day 0-1 alcoholic never smoker no drug use   Sister Mardene Celeste works Adult nurse endoscopy as Charity fundraiser   Social Determinants of Corporate investment banker Strain: Low Risk  (05/04/2023)   Overall Financial Resource Strain (CARDIA)    Difficulty of Paying Living Expenses: Not hard at all  Food Insecurity: No Food Insecurity (05/25/2023)   Hunger Vital Sign    Worried About Running Out of Food in the Last Year: Never true    Ran Out of Food in the Last Year: Never true  Transportation Needs: No Transportation Needs (05/25/2023)   PRAPARE - Administrator, Civil Service (Medical): No    Lack of Transportation (Non-Medical): No  Physical Activity: Unknown (05/04/2023)   Exercise Vital Sign    Days of Exercise per Week: 0 days    Minutes of Exercise per Session: Not on file  Stress: No Stress Concern Present (05/04/2023)   [employer] of Occupational Health - Occupational Stress Questionnaire    Feeling of Stress : Not at all  Social Connections: Socially Integrated (05/04/2023)   Social Connection and Isolation Panel [NHANES]    Frequency of Communication with Friends and Family: More  than three times a week    Frequency of Social Gatherings with Friends and Family: More than three times a week    Attends Religious Services: More than 4 times per year    Active Member of [employer] or Organizations: Yes    Attends Engineer, structural: More than 4 times per year    Marital Status: Married     Vital Signs: There were no vitals taken for this visit.  No physical exam was performed in lieu of virtual telephone visit.   Imaging: CT AP 06/23/23  Occlusion of the central and peripeheral aspects of indwelling left iliac vein stent.  Possible small focal thormbus adherent to apex of IVC filter.  Non-occlusive DVT extending through left CFV, femoral, and popliteal veins with patent calf veins on duplex.    Labs:  CBC: Recent Labs    05/18/23 0202 05/19/23 0119 05/20/23 0101 05/21/23 0643  WBC 6.1 6.6 7.1 6.1  HGB 9.4* 10.0* 9.0* 8.7*  HCT 28.0* 30.0* 26.7* 25.5*  PLT 273 285 297 265    COAGS: Recent Labs    05/14/23 1930 05/15/23 0146  INR 1.1 1.1  APTT 32 65*    BMP: Recent Labs    05/18/23 1129 05/20/23 1245 05/21/23 0645 05/22/23 1104  NA 141 139 138 136  K 3.6 3.4* 2.8* 3.4*  CL 112* 107 110 107  CO2 18* 20* 20* 19*  GLUCOSE 124* 211* 112* 210*  BUN 9 7* 7* 11  CALCIUM 9.4 9.5 9.1 9.4  CREATININE 0.59 0.66 0.51 0.59  GFRNONAA >60 >60 >60 >60    LIVER FUNCTION TESTS: Recent Labs    01/20/23 0830 05/14/23 1749 05/15/23 0146 05/18/23 1129 05/20/23 1245 05/21/23 0645 05/22/23 1104  BILITOT 0.6 1.5* 1.3*  --   --   --   --   AST 22 26 21   --   --   --   --   ALT 42* 42 39  --   --   --   --   ALKPHOS 80 74 74  --   --   --   --  PROT 6.9 6.5 5.9*  --   --   --   --   ALBUMIN 4.3 3.7 3.0* 2.6* 2.6* 2.4* 2.6*    Assessment and Plan: 67 year old female with 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 She underwent IVC filter placement with  thrombectomy/angioplasty of left iliac and femoral veins and left iliac vein stent placement on 05/19/23.  Unfortunately she has experienced thrombosis of the central and peripheral aspects of the left iliac vein stent and demonstrates non-occlusive thrombus throughout the left popliteal through common femoral veins.  Thankfully, she has remained asymptomatic and reports feeling much better.    Given extent of thrombus, we discussed treatment options including continuing anticoagulation alone versus additional intervention.  I recommend repeat aspiration thrombectomy of the left lower extremity and indwelling stent to re-establish flow.  She is in agreement with this plan.  Plan for left lower extremity venogram and thrombectomy at [facility] with moderate sedation.  Marliss Coots, MD Pager: [phone]    I spent a total of 40 Minutes in virtual clinical consultation, greater than 50% of which was counseling/coordinating care for history of May Thurner Syndrome.

## 2023-07-01 ENCOUNTER — Ambulatory Visit
Admission: RE | Admit: 2023-07-01 | Discharge: 2023-07-01 | Disposition: A | Discharge status: Home / Self Care | Payer: PPO | Source: Ambulatory Visit | Attending: Radiology | Admitting: Radiology

## 2023-07-01 ENCOUNTER — Telehealth: Payer: PPO

## 2023-07-01 DIAGNOSIS — I2609 Other pulmonary embolism with acute cor pulmonale: Secondary | ICD-10-CM

## 2023-07-01 HISTORY — PX: IR RADIOLOGIST EVAL & MGMT: IMG5224

## 2023-07-02 ENCOUNTER — Other Ambulatory Visit (HOSPITAL_COMMUNITY): Payer: Self-pay | Admitting: Interventional Radiology

## 2023-07-02 DIAGNOSIS — I8290 Acute embolism and thrombosis of unspecified vein: Secondary | ICD-10-CM

## 2023-07-05 ENCOUNTER — Encounter: Payer: Self-pay | Admitting: Cardiovascular Disease

## 2023-07-05 NOTE — Progress Notes (Unsigned)
Cardiology Office Note:    Date:  07/06/2023   ID:  Heather Hudson, DOB 1956/06/29, MRN [id number]  PCP:  Heather Nest, NP   [facility] Providers Cardiologist:  Bandon Sherwin Click to update primary MD,subspecialty MD or APP then REFRESH:1}    Referring MD: Heather Nest, NP   Chief Complaint  Patient presents with   Shortness of Breath    History of Present Illness:    Heather Hudson is a 67 y.o. female with a hx of abnormal ECG We are asked to see her for further evaluation of her abn. ECG from 01/20/23 revealed sinus brady at 51 with NS ST abnl   Seen with her husband , Rob .  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  No syncope   Lipids from April 16 were reviewed LDL is 129 Trigs :  140   Fam hx  Father - CABG in his 61s Mother -  Brother - died at 36 - had DM, PAD, CAD   Going to [place] next week    Originally from [place]    Sept. 30, 2024 Heather Hudson is seen for her dyspnea, abnormal ECG   She was admitted to the hospital on May 14, 2023 with severe shortness of breath and was found to have a submassive pulmonary embolus. Coronary calcium score from April 20, 2023 shows calcium score of 0 which is  low risk  Echocardiogram from May 15, 2023 reveals normal left ventricular systolic function with EF of 60 to 65%. Trivial mitral regurgitation Trivial aortic regurgitation Right ventricular function is mildly reduced. She has moderate pulmonary hypertension with estimated PA pressure of 49.  From what I can tell,  this appears to be an unprovoked PE  She may need to be considered for lifelong anticoagulation   Is on Eliquis   Her left foot is now in a boot    Diastolic BP has been mildly elevated.      Past Medical History:   Diagnosis Date   Adenomatous colon polyp 2015   Allergy 1974   teenager, and 2008   Colovesical fistula 2015   COVID-19 06/2020   Environmental and seasonal allergies    Hx of adenomatous colonic polyps 01/19/2021   Hypertension 11/23   Low dose prescription   Lower abdominal pain 12/23/2019   Migraines    Motion sickness    Persistent cough for 3 weeks or longer 10/10/2021    Past Surgical History:  Procedure Laterality Date   COLON SURGERY  2016   fistula, small section removed   COLONOSCOPY  2015   Plus others   IR INTRAVASCULAR ULTRASOUND NON CORONARY  05/19/2023   IR IVC FILTER PLMT / S&I /IMG GUID/MOD SED  05/19/2023   IR PTA VENOUS EXCEPT DIALYSIS CIRCUIT  05/19/2023   IR RADIOLOGIST EVAL & MGMT  07/01/2023   IR THROMBECT VENO MECH MOD SED  05/19/2023   IR TRANSCATH PLC STENT 1ST ART NOT LE CV CAR VERT CAR  05/19/2023   IR US GUIDE VASC ACCESS LEFT  05/19/2023   IR US GUIDE VASC ACCESS RIGHT  05/19/2023   IR VENO/EXT/UNI LEFT  05/19/2023   IR VENOCAVAGRAM IVC  05/19/2023   LAPAROSCOPIC LEFT COLON RESECTION  2015  Colovesical fistula, [place]   Left peroneal tendon repair Left 2023   WISDOM TOOTH EXTRACTION      Current Medications: Current Meds  Medication Sig   apixaban (ELIQUIS) 5 MG TABS tablet Take 1 tablet (5 mg total) by mouth 2 (two) times daily.   calcium-vitamin D (OSCAL WITH D) 500-5 MG-MCG tablet Take 1 tablet by mouth daily with breakfast.   Cholecalciferol (VITAMIN D3) 50 MCG (2000 UT) capsule Take 2,000 Units by mouth daily.   clobetasol (TEMOVATE) 0.05 % GEL APPLY TO AFFECTED AREAS OF THE MOUTH TWICE DAILY WHILE FLARING.   clotrimazole (MYCELEX) 10 MG troche Take by mouth.   folic acid (FOLVITE) 1 MG tablet Take 1 tablet (1 mg total) by mouth daily. (Patient taking differently: Take 3 mg by mouth daily.)   losartan (COZAAR) 25 MG tablet TAKE 1 TABLET BY MOUTH EVERY DAY FOR BLOOD PRESSURE   metFORMIN (GLUCOPHAGE-XR) 500 MG 24 hr tablet Take 2 tablets  (1,000 mg total) by mouth 2 (two) times daily with a meal.   methotrexate (RHEUMATREX) 2.5 MG tablet Take 10 mg by mouth once a week.   omeprazole (PRILOSEC) 20 MG capsule Take 20 mg by mouth daily.   polyethylene glycol (MIRALAX / GLYCOLAX) 17 g packet Take 17 g by mouth daily as needed for mild constipation.   predniSONE (DELTASONE) 10 MG tablet Take 10 mg by mouth daily in the afternoon.   [DISCONTINUED] folic acid (FOLVITE) 1 MG tablet Take 3 mg by mouth daily. On non methotrexate days     Allergies:   Aspirin, Ibuprofen, Other, and Propofol   Social History   Socioeconomic History   Marital status: Married    Spouse name: Not on file   Number of children: Not on file   Years of education: Not on file   Highest education level: Associate degree: occupational, Scientist, product/process development, or vocational program  Occupational History   Not on file  Tobacco Use   Smoking status: Never   Smokeless tobacco: Never   Tobacco comments:    Never smoked  Vaping Use   Vaping status: Never Used  Substance and Sexual Activity   Alcohol use: Yes    Alcohol/week: 5.0 standard drinks of alcohol    Types: 5 Glasses of wine per week   Drug use: Never   Sexual activity: Yes    Birth control/protection: Post-menopausal  Other Topics Concern   Not on file  Social History Narrative   Married.  No children.   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   Enjoys wine tasting.    1-2 caffeinated drinks a day 0-1 alcoholic never smoker no drug use   Sister Mardene Celeste works Adult nurse endoscopy as Charity fundraiser   Social Determinants of Corporate investment banker Strain: Low Risk  (05/04/2023)   Overall Financial Resource Strain (CARDIA)    Difficulty of Paying Living Expenses: Not hard at all  Food Insecurity: No Food Insecurity (05/25/2023)   Hunger Vital Sign     Worried About Running Out of Food in the Last Year: Never true    Ran Out of Food in the Last Year: Never true  Transportation Needs: No Transportation Needs (05/25/2023)   PRAPARE - Administrator, Civil Service (Medical): No    Lack of Transportation (Non-Medical): No  Physical  Activity: Unknown (05/04/2023)   Exercise Vital Sign    Days of Exercise per Week: 0 days    Minutes of Exercise per Session: Not on file  Stress: No Stress Concern Present (05/04/2023)   [employer] of Occupational Health - Occupational Stress Questionnaire    Feeling of Stress : Not at all  Social Connections: Socially Integrated (05/04/2023)   Social Connection and Isolation Panel [NHANES]    Frequency of Communication with Friends and Family: More than three times a week    Frequency of Social Gatherings with Friends and Family: More than three times a week    Attends Religious Services: More than 4 times per year    Active Member of [employer] or Organizations: Yes    Attends Engineer, structural: More than 4 times per year    Marital Status: Married     Family History: The patient's family history includes Alcohol abuse in her father; COPD in her father; Cancer in her father and maternal grandmother; Diabetes in her brother, brother, and mother; Hearing loss in her mother; Heart disease in her father and mother; Intellectual disability in her brother; Learning disabilities in her brother; Malignant hyperthermia in her sister; Miscarriages / Stillbirths in her maternal grandmother, sister, and sister; Pancreatic cancer in her maternal aunt; Prostate cancer in her father; Vision loss in her brother. There is no history of Colon cancer, Esophageal cancer, Stomach cancer, Liver disease, or Breast cancer.  ROS:   Please see the history of present illness.     All other systems reviewed and are negative.  EKGs/Labs/Other Studies Reviewed:    The following studies were reviewed  today:   EKG:    Recent Labs: 05/05/2023: TSH 2.38 05/14/2023: B Natriuretic Peptide 37.4 05/15/2023: ALT 39 05/21/2023: Hemoglobin 8.7; Platelets 265 05/22/2023: BUN 11; Creatinine, Ser 0.59; Magnesium 1.9; Potassium 3.4; Sodium 136  Recent Lipid Panel    Component Value Date/Time   CHOL 223 (H) 01/20/2023 0830   TRIG 140.0 01/20/2023 0830   HDL 66.10 01/20/2023 0830   CHOLHDL 3 01/20/2023 0830   VLDL 28.0 01/20/2023 0830   LDLCALC 129 (H) 01/20/2023 0830     Risk Assessment/Calculations:       Physical Exam:    Physical Exam: Blood pressure 126/85, pulse 86, height 5\' 8"  (1.727 m), weight 175 lb (79.4 kg), SpO2 98%.       GEN:  Well nourished, well developed in no acute distress HEENT: Normal NECK: No JVD; No carotid bruits LYMPHATICS: No lymphadenopathy CARDIAC: RRR , no murmurs, rubs, gallops RESPIRATORY:  Clear to auscultation without rales, wheezing or rhonchi  ABDOMEN: Soft, non-tender, non-distended MUSCULOSKELETAL:  No edema; No deformity  SKIN: Warm and dry NEUROLOGIC:  Alert and oriented x 3   ASSESSMENT:    1. Other acute pulmonary embolism without acute cor pulmonale (HCC)   2. Primary hypertension   3. Mixed hyperlipidemia     PLAN:       DOE:   better ,  cont eliquis  From what I can tell I would call this an unprovoked pulmonary embolus.   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  Consider lifelong anticoagulation    2.  Hyperlipidemia:     3.  HTN:    diastolic is mildly elevated .   Encouraged a better diet, more exercise when she is able.      I will see her again in 6 months.      Medication Adjustments/Labs and  Tests Ordered: Current medicines are reviewed at length with the patient today.  Concerns regarding medicines are outlined above.  No orders of the defined types were placed in this encounter.  No orders of the defined types were placed in this encounter.   Patient Instructions  Medication Instructions:   Your  physician recommends that you continue on your current medications as directed. Please refer to the Current Medication list given to you today.  *If you need a refill on your cardiac medications before your next appointment, please call your pharmacy*     Follow-Up: At [facility], you and your health needs are our priority.  As part of our continuing mission to provide you with exceptional heart care, we have created designated Provider Care Teams.  These Care Teams include your primary Cardiologist (physician) and Advanced Practice Providers (APPs -  Physician Assistants and Nurse Practitioners) who all work together to provide you with the care you need, when you need it.  We recommend signing up for the patient portal called "MyChart".  Sign up information is provided on this After Visit Summary.  MyChart is used to connect with patients for Virtual Visits (Telemedicine).  Patients are able to view lab/test results, encounter notes, upcoming appointments, etc.  Non-urgent messages can be sent to your provider as well.   To learn more about what you can do with MyChart, go to [email or web address].    Your next appointment:   6 month(s)  Provider:   Dr. Elease Hashimoto     Signed, Kristeen Miss, MD  07/06/2023 5:37 PM    [facility]

## 2023-07-06 ENCOUNTER — Other Ambulatory Visit: Payer: Self-pay | Admitting: Primary Care

## 2023-07-06 ENCOUNTER — Ambulatory Visit: Payer: PPO | Attending: Cardiovascular Disease | Admitting: Cardiovascular Disease

## 2023-07-06 ENCOUNTER — Encounter: Payer: Self-pay | Admitting: Cardiovascular Disease

## 2023-07-06 VITALS — BP 126/85 | HR 86 | Ht 68.0 in | Wt 175.0 lb

## 2023-07-06 DIAGNOSIS — I10 Essential (primary) hypertension: Secondary | ICD-10-CM

## 2023-07-06 DIAGNOSIS — I2699 Other pulmonary embolism without acute cor pulmonale: Secondary | ICD-10-CM

## 2023-07-06 DIAGNOSIS — E782 Mixed hyperlipidemia: Secondary | ICD-10-CM | POA: Diagnosis not present

## 2023-07-06 DIAGNOSIS — E2839 Other primary ovarian failure: Secondary | ICD-10-CM

## 2023-07-06 MED ORDER — OYSTER SHELL CALCIUM/D3 500-5 MG-MCG PO TABS
1.0000 | ORAL_TABLET | Freq: Every day | ORAL | 1 refills | Status: DC
Start: 2023-07-06 — End: 2023-10-16

## 2023-07-06 NOTE — Patient Instructions (Signed)
Medication Instructions:   Your physician recommends that you continue on your current medications as directed. Please refer to the Current Medication list given to you today.  *If you need a refill on your cardiac medications before your next appointment, please call your pharmacy*     Follow-Up: At [facility], you and your health needs are our priority.  As part of our continuing mission to provide you with exceptional heart care, we have created designated Provider Care Teams.  These Care Teams include your primary Cardiologist (physician) and Advanced Practice Providers (APPs -  Physician Assistants and Nurse Practitioners) who all work together to provide you with the care you need, when you need it.  We recommend signing up for the patient portal called "MyChart".  Sign up information is provided on this After Visit Summary.  MyChart is used to connect with patients for Virtual Visits (Telemedicine).  Patients are able to view lab/test results, encounter notes, upcoming appointments, etc.  Non-urgent messages can be sent to your provider as well.   To learn more about what you can do with MyChart, go to [email or web address].    Your next appointment:   6 month(s)  Provider:   Dr. Elease Hashimoto

## 2023-07-13 ENCOUNTER — Other Ambulatory Visit (HOSPITAL_COMMUNITY): Payer: Self-pay | Admitting: Student

## 2023-07-13 DIAGNOSIS — I2609 Other pulmonary embolism with acute cor pulmonale: Secondary | ICD-10-CM

## 2023-07-13 DIAGNOSIS — I82433 Acute embolism and thrombosis of popliteal vein, bilateral: Secondary | ICD-10-CM

## 2023-07-13 MED ORDER — ENOXAPARIN SODIUM 80 MG/0.8ML IJ SOSY
1.0000 mg/kg | PREFILLED_SYRINGE | Freq: Once | INTRAMUSCULAR | Status: DC
  Filled 2023-07-13: qty 0.8

## 2023-07-14 MED ORDER — APIXABAN 5 MG PO TABS
5.0000 mg | ORAL_TABLET | Freq: Two times a day (BID) | ORAL | 0 refills | Status: DC
Start: 2023-07-14 — End: 2023-07-16

## 2023-07-15 ENCOUNTER — Other Ambulatory Visit: Payer: Self-pay | Admitting: Radiology

## 2023-07-16 ENCOUNTER — Other Ambulatory Visit (HOSPITAL_COMMUNITY): Payer: Self-pay | Admitting: Interventional Radiology

## 2023-07-16 ENCOUNTER — Ambulatory Visit (HOSPITAL_COMMUNITY)
Admission: RE | Admit: 2023-07-16 | Discharge: 2023-07-16 | Disposition: A | Discharge status: Home / Self Care | Payer: PPO | Source: Ambulatory Visit | Attending: Interventional Radiology | Admitting: Interventional Radiology

## 2023-07-16 ENCOUNTER — Telehealth (HOSPITAL_COMMUNITY): Payer: Self-pay | Admitting: Student

## 2023-07-16 ENCOUNTER — Encounter (HOSPITAL_COMMUNITY): Payer: Self-pay

## 2023-07-16 ENCOUNTER — Other Ambulatory Visit: Payer: Self-pay

## 2023-07-16 DIAGNOSIS — Z86711 Personal history of pulmonary embolism: Secondary | ICD-10-CM | POA: Insufficient documentation

## 2023-07-16 DIAGNOSIS — I8290 Acute embolism and thrombosis of unspecified vein: Secondary | ICD-10-CM

## 2023-07-16 DIAGNOSIS — I82512 Chronic embolism and thrombosis of left femoral vein: Secondary | ICD-10-CM | POA: Diagnosis not present

## 2023-07-16 DIAGNOSIS — Y832 Surgical operation with anastomosis, bypass or graft as the cause of abnormal reaction of the patient, or of later complication, without mention of misadventure at the time of the procedure: Secondary | ICD-10-CM | POA: Insufficient documentation

## 2023-07-16 DIAGNOSIS — T82868A Thrombosis of vascular prosthetic devices, implants and grafts, initial encounter: Secondary | ICD-10-CM | POA: Insufficient documentation

## 2023-07-16 DIAGNOSIS — I82433 Acute embolism and thrombosis of popliteal vein, bilateral: Secondary | ICD-10-CM

## 2023-07-16 DIAGNOSIS — I2609 Other pulmonary embolism with acute cor pulmonale: Secondary | ICD-10-CM

## 2023-07-16 HISTORY — PX: IR US GUIDE VASC ACCESS LEFT: IMG2389

## 2023-07-16 HISTORY — PX: IR VENO/EXT/UNI LEFT: IMG675

## 2023-07-16 HISTORY — PX: IR INTRAVASCULAR ULTRASOUND NON CORONARY: IMG6085

## 2023-07-16 HISTORY — PX: IR THROMBECT VENO MECH MOD SED: IMG2300

## 2023-07-16 HISTORY — PX: IR VENOCAVAGRAM IVC: IMG678

## 2023-07-16 HISTORY — PX: IR PTA VENOUS EXCEPT DIALYSIS CIRCUIT: IMG6126

## 2023-07-16 HISTORY — PX: IR TRANSCATH PLC STENT 1ST ART NOT LE CV CAR VERT CAR: IMG5443

## 2023-07-16 LAB — BASIC METABOLIC PANEL
Anion gap: 10 (ref 5–15)
BUN: 9 mg/dL (ref 8–23)
CO2: 20 mmol/L — ABNORMAL LOW (ref 22–32)
Calcium: 10.3 mg/dL (ref 8.9–10.3)
Chloride: 112 mmol/L — ABNORMAL HIGH (ref 98–111)
Creatinine, Ser: 0.71 mg/dL (ref 0.44–1.00)
GFR, Estimated: >60 mL/min (ref >60)
Glucose, Bld: 94 mg/dL (ref 70–99)
Potassium: 3.5 mmol/L (ref 3.5–5.1)
Sodium: 142 mmol/L (ref 135–145)

## 2023-07-16 LAB — POCT ACTIVATED CLOTTING TIME
Activated Clotting Time: 122 s
Activated Clotting Time: 207 s
Activated Clotting Time: 220 s

## 2023-07-16 LAB — CBC
HCT: 38.2 % (ref 36.0–46.0)
Hemoglobin: 12.7 g/dL (ref 12.0–15.0)
MCH: 29.8 pg (ref 26.0–34.0)
MCHC: 33.2 g/dL (ref 30.0–36.0)
MCV: 89.7 fL (ref 80.0–100.0)
Platelets: 190 10*3/uL (ref 150–400)
RBC: 4.26 MIL/uL (ref 3.87–5.11)
RDW: 14.6 % (ref 11.5–15.5)
WBC: 6.2 10*3/uL (ref 4.0–10.5)
nRBC: 0 % (ref 0.0–0.2)

## 2023-07-16 LAB — PROTIME-INR
INR: 1.1 (ref 0.8–1.2)
Prothrombin Time: 14.3 s (ref 11.4–15.2)

## 2023-07-16 MED ORDER — HEPARIN SODIUM (PORCINE) 1000 UNIT/ML IJ SOLN
INTRAMUSCULAR | Status: AC | PRN
Start: 2023-07-16 — End: 2023-07-16
  Administered 2023-07-16: 3000 [IU] via INTRAVENOUS
  Administered 2023-07-16: 8000 [IU] via INTRAVENOUS

## 2023-07-16 MED ORDER — HEPARIN SODIUM (PORCINE) 1000 UNIT/ML IJ SOLN
INTRAMUSCULAR | Status: AC
  Filled 2023-07-16: qty 10

## 2023-07-16 MED ORDER — SODIUM CHLORIDE 0.9 % IV SOLN: INTRAVENOUS | Status: DC

## 2023-07-16 MED ORDER — MIDAZOLAM HCL 2 MG/2ML IJ SOLN
INTRAMUSCULAR | Status: AC
  Filled 2023-07-16: qty 2

## 2023-07-16 MED ORDER — LIDOCAINE-EPINEPHRINE 1 %-1:100000 IJ SOLN
INTRAMUSCULAR | Status: AC
  Filled 2023-07-16: qty 1

## 2023-07-16 MED ORDER — FENTANYL CITRATE (PF) 100 MCG/2ML IJ SOLN
INTRAMUSCULAR | Status: AC | PRN
Start: 2023-07-16 — End: 2023-07-16
  Administered 2023-07-16: 25 ug via INTRAVENOUS
  Administered 2023-07-16: 50 ug via INTRAVENOUS
  Administered 2023-07-16: 25 ug via INTRAVENOUS
  Administered 2023-07-16: 50 ug via INTRAVENOUS
  Administered 2023-07-16: 25 ug via INTRAVENOUS
  Administered 2023-07-16 (×4): 50 ug via INTRAVENOUS
  Administered 2023-07-16 (×3): 25 ug via INTRAVENOUS

## 2023-07-16 MED ORDER — MIDAZOLAM HCL 2 MG/2ML IJ SOLN
INTRAMUSCULAR | Status: AC | PRN
Start: 2023-07-16 — End: 2023-07-16
  Administered 2023-07-16 (×3): 1 mg via INTRAVENOUS
  Administered 2023-07-16 (×2): .5 mg via INTRAVENOUS
  Administered 2023-07-16: 1 mg via INTRAVENOUS
  Administered 2023-07-16: .5 mg via INTRAVENOUS
  Administered 2023-07-16: 1 mg via INTRAVENOUS
  Administered 2023-07-16 (×3): .5 mg via INTRAVENOUS
  Administered 2023-07-16: 1 mg via INTRAVENOUS

## 2023-07-16 MED ORDER — DIPHENHYDRAMINE HCL 50 MG/ML IJ SOLN
INTRAMUSCULAR | Status: AC
  Filled 2023-07-16: qty 1

## 2023-07-16 MED ORDER — IODIXANOL 320 MG/ML IV SOLN
100.0000 mL | Freq: Once | INTRAVENOUS | Status: AC | PRN
  Administered 2023-07-16: 75 mL via INTRAVENOUS

## 2023-07-16 MED ORDER — FENTANYL CITRATE (PF) 100 MCG/2ML IJ SOLN
INTRAMUSCULAR | Status: AC
  Filled 2023-07-16: qty 2

## 2023-07-16 MED ORDER — IODIXANOL 320 MG/ML IV SOLN
100.0000 mL | Freq: Once | INTRAVENOUS | Status: AC | PRN
  Administered 2023-07-16: 80 mL via INTRAVENOUS

## 2023-07-16 MED ORDER — DIPHENHYDRAMINE HCL 50 MG/ML IJ SOLN
INTRAMUSCULAR | Status: AC | PRN
Start: 2023-07-16 — End: 2023-07-16
  Administered 2023-07-16: 50 mg via INTRAVENOUS

## 2023-07-16 MED ORDER — ENOXAPARIN SODIUM 100 MG/ML IJ SOSY: 1.0000 mg/kg | PREFILLED_SYRINGE | Freq: Two times a day (BID) | INTRAMUSCULAR | 0 refills | Status: DC

## 2023-07-16 MED ORDER — SODIUM CHLORIDE 0.9% FLUSH: 10.0000 mL | Freq: Two times a day (BID) | INTRAVENOUS | Status: DC

## 2023-07-16 MED ORDER — CLOPIDOGREL BISULFATE 75 MG PO TABS
75.0000 mg | ORAL_TABLET | Freq: Every day | ORAL | 3 refills | Status: AC
Start: 2023-07-16 — End: 2024-07-10

## 2023-07-16 MED ORDER — ENOXAPARIN SODIUM 80 MG/0.8ML IJ SOSY
1.0000 mg/kg | PREFILLED_SYRINGE | INTRAMUSCULAR | Status: AC
  Administered 2023-07-16: 80 mg via SUBCUTANEOUS
  Filled 2023-07-16: qty 0.8

## 2023-07-16 NOTE — Sedation Documentation (Addendum)
ACT 220 

## 2023-07-16 NOTE — Sedation Documentation (Signed)
ACT 122

## 2023-07-16 NOTE — Procedures (Signed)
Interventional Radiology Procedure Note  Procedure:  1) Left lower extremity venogram 2) Left iliofemoral aspiration thrombectomy 3) Left iliofemoral balloon angioplasty 4) Left iliofemoral drug coated balloon angioplasty 5) Left iliofemoral stent placement  Findings: Please refer to procedural dictation for full description. Chronic occlusion of the femoral vein, common femoral vein, and peripheral to mid aspect of indwelling iliac vein stent.  Marginally successful aspiration thrombectomy followed by balloon maceration and drug coated angioplasty throughout.  Persistent recalcitrant stenosis about the common femoral vein just peripheral to stent, therefore additional 14 mm x 150 mm Abre stent placed, extending into common femoral vein.  Left popliteal vein access, closed with 2 Proglides to achieve immediate hemostasis.  Complications: None immediate  Estimated Blood Loss: 300 mL  Recommendations: 1 mg/kg Lovenox administered in IR, to begin BID dosing for 30 days, then can resume eliquis. Add plavix 75 mg. LLE compression stockings. IR will arrange for 1 month follow up with CTV abdomen/pelvis.   Marliss Coots, MD

## 2023-07-16 NOTE — H&P (Signed)
Chief Complaint: Patient was seen in consultation today for venogram and aspiration thrombectomy of left lower extremity and indwelling stent.  Supervising Physician: Marliss Coots  Patient Status: [facility] - Out-pt  History of Present Illness: Heather Hudson is a 67 y.o. 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 She underwent IVC filter placement with thrombectomy/angioplasty of left iliac and femoral veins and left iliac vein stent placement on 05/19/23.  Unfortunately she has experienced thrombosis of the central and peripheral aspects of the left iliac vein stent and demonstrates non-occlusive thrombus throughout the left popliteal through common femoral veins.  Thankfully, she has remained asymptomatic and reports feeling much better.   Given extent of thrombus, we discussed treatment options including continuing anticoagulation alone versus additional intervention.  I recommend repeat aspiration thrombectomy of the left lower extremity and indwelling stent to re-establish flow.  She is in agreement with this plan. Plan for left lower extremity venogram and thrombectomy at [facility] with moderate sedation  Pt here today for same Doing well Remains asymptomatic  Does state she feels minimally short of breath if she bends over to tie shoe; or dry off from shower. Last only seconds Noticed in last few weeks    Past Medical History:  Diagnosis Date   Adenomatous colon polyp 2015   Allergy 1974   teenager, and 2008   Colovesical fistula 2015   COVID-19 06/2020   Environmental and seasonal allergies    Hx of adenomatous colonic polyps 01/19/2021   Hypertension 11/23   Low dose prescription   Lower abdominal pain 12/23/2019   Migraines    Motion sickness    Persistent cough for 3 weeks or longer 10/10/2021    Past Surgical History:   Procedure Laterality Date   COLON SURGERY  2016   fistula, small section removed   COLONOSCOPY  2015   Plus others   IR INTRAVASCULAR ULTRASOUND NON CORONARY  05/19/2023   IR IVC FILTER PLMT / S&I /IMG GUID/MOD SED  05/19/2023   IR PTA VENOUS EXCEPT DIALYSIS CIRCUIT  05/19/2023   IR RADIOLOGIST EVAL & MGMT  07/01/2023   IR THROMBECT VENO MECH MOD SED  05/19/2023   IR TRANSCATH PLC STENT 1ST ART NOT LE CV CAR VERT CAR  05/19/2023   IR US GUIDE VASC ACCESS LEFT  05/19/2023   IR US GUIDE VASC ACCESS RIGHT  05/19/2023   IR VENO/EXT/UNI LEFT  05/19/2023   IR VENOCAVAGRAM IVC  05/19/2023   LAPAROSCOPIC LEFT COLON RESECTION  2015   Colovesical fistula, [place]   Left peroneal tendon repair Left 2023   WISDOM TOOTH EXTRACTION      Allergies: Aspirin, Ibuprofen, Other, and Propofol  Medications: Prior to Admission medications   Medication Sig Start Date End Date Taking? Authorizing Provider  apixaban (ELIQUIS) 5 MG TABS tablet Take 1 tablet (5 mg total) by mouth 2 (two) times daily. 07/14/23  Yes [name], NP  calcium-vitamin D Ruthell Rummage WITH D) 500-5 MG-MCG tablet Take 1 tablet by mouth daily with breakfast. 07/06/23  Yes [name], NP  Cholecalciferol (VITAMIN D3) 50 MCG (2000 UT) capsule Take 2,000 Units by mouth daily.   Yes [provider]  clotrimazole (MYCELEX) 10 MG troche Take by mouth. 06/18/23  Yes [provider]  folic acid (FOLVITE) 1 MG tablet Take 1 tablet (1 mg total) by mouth  daily. Patient taking differently: Take 3 mg by mouth daily. 05/23/23  Yes Berton Mount I, MD  losartan (COZAAR) 25 MG tablet TAKE 1 TABLET BY MOUTH EVERY DAY FOR BLOOD PRESSURE 01/21/23  Yes [name], NP  metFORMIN (GLUCOPHAGE-XR) 500 MG 24 hr tablet Take 2 tablets (1,000 mg total) by mouth 2 (two) times daily with a meal. 05/29/23  Yes Eden Emms, NP  methotrexate (RHEUMATREX) 2.5 MG tablet Take 10 mg by mouth once a week. 04/28/23  Yes [provider]   predniSONE (DELTASONE) 10 MG tablet Take 10 mg by mouth daily in the afternoon. 01/14/23  Yes [provider]  clobetasol (TEMOVATE) 0.05 % GEL APPLY TO AFFECTED AREAS OF THE MOUTH TWICE DAILY WHILE FLARING. 06/18/23   [provider]  omeprazole (PRILOSEC) 20 MG capsule Take 20 mg by mouth daily.    [provider]  polyethylene glycol (MIRALAX / GLYCOLAX) 17 g packet Take 17 g by mouth daily as needed for mild constipation. 05/22/23   Barnetta Chapel, MD     Family History  Problem Relation Age of Onset   Heart disease Mother    Diabetes Mother    Hearing loss Mother    Heart disease Father    Prostate cancer Father    Alcohol abuse Father    Cancer Father    COPD Father    Malignant hyperthermia Sister    Pancreatic cancer Maternal Aunt    Diabetes Brother    Cancer Maternal Grandmother    Miscarriages / Stillbirths Maternal Grandmother    Diabetes Brother    Intellectual disability Brother    Learning disabilities Brother    Vision loss Brother    Miscarriages / Stillbirths Sister    Miscarriages / [place] Sister    Colon cancer Neg Hx    Esophageal cancer Neg Hx    Stomach cancer Neg Hx    Liver disease Neg Hx    Breast cancer Neg Hx     Social History   Socioeconomic History   Marital status: Married    Spouse name: Not on file   Number of children: Not on file   Years of education: Not on file   Highest education level: Associate degree: occupational, Scientist, product/process development, or vocational program  Occupational History   Not on file  Tobacco Use   Smoking status: Never   Smokeless tobacco: Never   Tobacco comments:    Never smoked  Vaping Use   Vaping status: Never Used  Substance and Sexual Activity   Alcohol use: Yes    Alcohol/week: 5.0 standard drinks of alcohol    Types: 5 Glasses of wine per week   Drug use: Never   Sexual activity: Yes    Birth control/protection: Post-menopausal  Other Topics Concern   Not on file  Social  History Narrative   Married.  No children.   Moved from [place] area [place] in [place]    Worked in [employer], Insurance account manager.  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   Enjoys wine tasting.    1-2 caffeinated drinks a day 0-1 alcoholic never smoker no drug use   Sister Mardene Celeste works Adult nurse endoscopy as Charity fundraiser   Social Determinants of Corporate investment banker Strain: Low Risk  (05/04/2023)   Overall Financial Resource Strain (CARDIA)    Difficulty of Paying Living Expenses: Not hard at all  Food  Insecurity: No Food Insecurity (05/25/2023)   Hunger Vital Sign    Worried About Running Out of Food in the Last Year: Never true    Ran Out of Food in the Last Year: Never true  Transportation Needs: No Transportation Needs (05/25/2023)   PRAPARE - Administrator, Civil Service (Medical): No    Lack of Transportation (Non-Medical): No  Physical Activity: Unknown (05/04/2023)   Exercise Vital Sign    Days of Exercise per Week: 0 days    Minutes of Exercise per Session: Not on file  Stress: No Stress Concern Present (05/04/2023)   [employer] of Occupational Health - Occupational Stress Questionnaire    Feeling of Stress : Not at all  Social Connections: Socially Integrated (05/04/2023)   Social Connection and Isolation Panel [NHANES]    Frequency of Communication with Friends and Family: More than three times a week    Frequency of Social Gatherings with Friends and Family: More than three times a week    Attends Religious Services: More than 4 times per year    Active Member of [employer] or Organizations: Yes    Attends Engineer, structural: More than 4 times per year    Marital Status: Married    Review of Systems: A 12 point ROS discussed and pertinent positives are indicated in the HPI above.  All other systems are negative.  Review of Systems  Constitutional:  Negative for  activity change, fatigue and fever.  Respiratory:  Negative for cough and shortness of breath.   Cardiovascular:  Negative for chest pain.  Gastrointestinal:  Negative for abdominal pain.  Musculoskeletal:  Negative for back pain and gait problem.  Neurological:  Negative for weakness.  Psychiatric/Behavioral:  Negative for behavioral problems and confusion.     Vital Signs: BP (!) 154/83   Pulse 68   Temp 97.7 F (36.5 C)   Resp 18   Ht 5\' 8"  (1.727 m)   Wt 177 lb (80.3 kg)   SpO2 99%   BMI 26.91 kg/m   Advance Care Plan: The advanced care plan/surrogate decision maker was discussed at the time of visit and documented in the medical record.    Physical Exam Vitals reviewed.  HENT:     Mouth/Throat:     Mouth: Mucous membranes are moist.  Cardiovascular:     Rate and Rhythm: Normal rate and regular rhythm.     Heart sounds: Normal heart sounds.  Pulmonary:     Effort: Pulmonary effort is normal.     Breath sounds: Normal breath sounds.  Abdominal:     Palpations: Abdomen is soft.     Tenderness: There is no abdominal tenderness.  Musculoskeletal:        General: Normal range of motion.  Skin:    General: Skin is warm.  Neurological:     Mental Status: She is alert and oriented to person, place, and time.  Psychiatric:        Behavior: Behavior normal.     Imaging: IR Radiologist Eval & Mgmt  Result Date: 07/01/2023 EXAM: ESTABLISHED PATIENT OFFICE VISIT CHIEF COMPLAINT: See Epic note. HISTORY OF PRESENT ILLNESS: See Epic note. REVIEW OF SYSTEMS: See Epic note. PHYSICAL EXAMINATION: See Epic note. ASSESSMENT AND PLAN: See Epic note. Marliss Coots, MD [facility] Radiology Electronically Signed   By: Marliss Coots M.D.   On: 07/01/2023 10:52   US Venous Img Lower Unilateral Left (DVT)  Result Date: 06/23/2023  CLINICAL DATA:  67 year old female with history of May-Thurner syndrome status post aspiration thrombectomy  of the left iliac and femoral veins with stent placement in addition to IVC filter placement on 05/19/2023. Initial follow-up. EXAM: LEFT LOWER EXTREMITY VENOUS DOPPLER ULTRASOUND TECHNIQUE: Gray-scale sonography with graded compression, as well as color Doppler and duplex ultrasound were performed to evaluate the left lower extremity deep venous systems from the level of the common femoral vein and including the common femoral, femoral, profunda femoral, popliteal and calf veins including the posterior tibial, peroneal and gastrocnemius veins when visible. Spectral Doppler was utilized to evaluate flow at rest and with distal augmentation maneuvers in the common femoral, femoral and popliteal veins. The contralateral common femoral vein was also evaluated for comparison. COMPARISON:  05/18/2019, 05/19/2023 FINDINGS: LEFT LOWER EXTREMITY Common Femoral Vein: Nonocclusive echogenic thrombus is visualized extending into the external iliac vein. Central Greater Saphenous Vein: No evidence of thrombus. Normal compressibility and flow on color Doppler imaging. Central Profunda Femoral Vein: Heterogeneously hypoechoic, non expansile, occlusive thrombus in the visualized portion. Femoral Vein: Heterogeneously hypoechoic, non expansile, occlusive thrombus throughout. Popliteal Vein: Heterogeneously hypoechoic, non expansile, occlusive thrombus throughout. Calf Veins: No evidence of thrombus. Normal compressibility and flow on color Doppler imaging. Other Findings:  None. RIGHT LOWER EXTREMITY Common Femoral Vein: No evidence of thrombus. Normal compressibility, respiratory phasicity and response to augmentation. IMPRESSION: Chronic occlusive deep vein thrombosis extending from the popliteal vein through the left common femoral vein. The calf veins are now patent. Marliss Coots, MD [facility] Radiology Electronically Signed   By: Marliss Coots M.D.   On: 06/23/2023 21:05    CT VENOGRAM ABD/PEL  Result Date: 06/23/2023 CLINICAL DATA:  History of pulmonary embolism and iliac venous stent placement, follow-up EXAM: CT VENOGRAM ABDOMEN AND PELVIS TECHNIQUE: Venographic phase images of the abdomen and pelvis were obtained following the administration of intravenous contrast. Multiplanar reformats and maximum intensity projections were generated. RADIATION DOSE REDUCTION: This exam was performed according to the departmental dose-optimization program which includes automated exposure control, adjustment of the mA and/or kV according to patient size and/or use of iterative reconstruction technique. CONTRAST:  OMNIPAQUE IOHEXOL 350 MG/ML SOLN COMPARISON:  Multiple examinations between August 8 and May 19, 2023 FINDINGS: Inferior chest: Small nonocclusive filling defects in the inferior pulmonary arteries consistent with sequela of previous pulmonary embolism. Hepatobiliary: The liver is normal in size without focal abnormality. No intrahepatic or extrahepatic biliary ductal dilation. The gallbladder appears normal. Spleen: Normal in size without focal abnormality. Pancreas: No pancreatic ductal dilatation or surrounding inflammatory changes. Adrenals/Urinary Tract: Adrenal glands are unremarkable. The kidneys are normal in size without hydronephrosis. Right simple renal cysts measure simple fluid attenuation and require no further imaging workup. Bladder is unremarkable. Stomach/Bowel: Surgical staple line along the rectosigmoid junction. The stomach, small bowel and large bowel are normal in caliber without abnormal wall thickening or surrounding inflammatory changes. The appendix is normal. Reproductive: Uterus and bilateral adnexa are unremarkable. Lymphatic: No enlarged lymph nodes in the abdomen or pelvis. Vasculature: The abdominal aorta is normal in caliber. The portal venous system is patent. Infrarenal IVC filter is present. Query small filling defects along the  anterior wall of the IVC just superior to the level of the retrievable hook of the IVC filter. A left-sided iliac venous stent is present extending from the left common iliac vein into the mid left external iliac vein. The stent appears thrombosed. Additional filling defects in the visualized left  femoral veins suggestive of chronic versus acute on chronic DVT. Other: No abdominopelvic ascites. Musculoskeletal: No aggressive osseous lesions. The soft tissues are unremarkable. IMPRESSION: 1. Left-sided iliac venous stent extending from the left common iliac vein to the mid left external iliac vein is thrombosed. 2. Infrarenal IVC filter is present. Query small filling defect along the anterior wall of the IVC just above the level of the retrievable hook of the IVC filter, possible wall adherent thrombus. 3. Additional filling defects present in the visualized left femoral veins, chronic versus acute on chronic DVT. Attention on forthcoming lower extremity ultrasound. 4. Nonocclusive filling defects in the visualized inferior pulmonary arteries compatible with sequela of recent pulmonary embolism. These results were called by electronic message in at the time of interpretation on 06/23/2023 at 11:30 am to provider [facility] S[facility]TLE , who acknowledged these results. Electronically Signed   By: Olive Bass M.D.   On: 06/23/2023 11:37    Labs:  CBC: Recent Labs    05/19/23 0119 05/20/23 0101 05/21/23 0643 07/16/23 0927  WBC 6.6 7.1 6.1 6.2  HGB 10.0* 9.0* 8.7* 12.7  HCT 30.0* 26.7* 25.5* 38.2  PLT 285 297 265 190    COAGS: Recent Labs    05/14/23 1930 05/15/23 0146 07/16/23 0927  INR 1.1 1.1 1.1  APTT 32 65*  --     BMP: Recent Labs    05/18/23 1129 05/20/23 1245 05/21/23 0645 05/22/23 1104  NA 141 139 138 136  K 3.6 3.4* 2.8* 3.4*  CL 112* 107 110 107  CO2 18* 20* 20* 19*  GLUCOSE 124* 211* 112* 210*  BUN 9 7* 7* 11  CALCIUM 9.4 9.5 9.1 9.4  CREATININE 0.59 0.66 0.51 0.59  GFRNONAA  >60 >60 >60 >60    LIVER FUNCTION TESTS: Recent Labs    01/20/23 0830 05/14/23 1749 05/15/23 0146 05/18/23 1129 05/20/23 1245 05/21/23 0645 05/22/23 1104  BILITOT 0.6 1.5* 1.3*  --   --   --   --   AST 22 26 21   --   --   --   --   ALT 42* 42 39  --   --   --   --   ALKPHOS 80 74 74  --   --   --   --   PROT 6.9 6.5 5.9*  --   --   --   --   ALBUMIN 4.3 3.7 3.0* 2.6* 2.6* 2.4* 2.6*    TUMOR MARKERS: No results for input(s): "AFPTM", "CEA", "CA199", "CHROMGRNA" in the last 8760 hours.  Assessment and Plan:  Scheduled for Venogram with aspiration thrombectomy of left lower extremity and indwelling stent Pt is aware of procedure risks and benefits including but not limited to Infection; bleeding; vessel damage. She is agreeable to proceed Consent signed and  in chart  Thank you for this interesting consult.  I greatly enjoyed meeting Heather Hudson and look forward to participating in their care.  A copy of this report was sent to the requesting provider on this date.  Electronically Signed: Robet Leu, PA-C 07/16/2023, 10:07 AM   I spent a total of    25 Minutes in face to face in clinical consultation, greater than 50% of which was counseling/coordinating care for venogram and thrombectomy of LLE and indwelling stent

## 2023-07-16 NOTE — Sedation Documentation (Signed)
[id number]

## 2023-07-16 NOTE — Telephone Encounter (Signed)
Lovenox 1 mg/kg BID x 30 days and Plavix 75 mg daily x 90 days with 3 refills e-prescribed to patient's pharmacy.   Patient will follow up with Dr. Elby Showers in one month for a clinic follow up. She will have a CT Venogram abdomen/pelvis prior to meeting with Dr. Elby Showers.   Alwyn Ren, [place] [phone] 07/16/2023, 2:02 PM

## 2023-07-17 ENCOUNTER — Ambulatory Visit: Payer: PPO | Admitting: Podiatry

## 2023-07-20 ENCOUNTER — Other Ambulatory Visit: Payer: Self-pay | Admitting: Interventional Radiology

## 2023-07-20 DIAGNOSIS — I871 Compression of vein: Secondary | ICD-10-CM

## 2023-07-22 ENCOUNTER — Ambulatory Visit: Payer: PPO | Admitting: Podiatry

## 2023-07-22 ENCOUNTER — Encounter: Payer: Self-pay | Admitting: Podiatry

## 2023-07-22 VITALS — Ht 68.0 in | Wt 177.0 lb

## 2023-07-22 DIAGNOSIS — M7672 Peroneal tendinitis, left leg: Secondary | ICD-10-CM | POA: Diagnosis not present

## 2023-07-22 NOTE — Progress Notes (Signed)
Subjective:  Patient ID: Heather Hudson, female    DOB: 03/01/1956,  MRN: [id number]  Chief Complaint  Patient presents with   Routine Post Op    Rm14: Peroneal tendinitis, left f/u    67 y.o. female presents with the above complaint.  Patient presents with complaint of left lateral foot pain.  She states a cam boot helped some.  Denies any other acute complaints.  She would like to discuss next treatment plan she still has some residual pain pain scale is 5 out of 10.  Patient had a reasonable left peroneal tendon repair surgery on 06/30/2022   Review of Systems: Negative except as noted in the HPI. Denies N/V/F/Ch.  Past Medical History:  Diagnosis Date   Adenomatous colon polyp 2015   Allergy 1974   teenager, and 2008   Colovesical fistula 2015   COVID-19 06/2020   Environmental and seasonal allergies    Hx of adenomatous colonic polyps 01/19/2021   Hypertension 11/23   Low dose prescription   Lower abdominal pain 12/23/2019   Migraines    Motion sickness    Persistent cough for 3 weeks or longer 10/10/2021    Current Outpatient Medications:    calcium-vitamin D (OSCAL WITH D) 500-5 MG-MCG tablet, Take 1 tablet by mouth daily with breakfast., Disp: 90 tablet, Rfl: 1   Cholecalciferol (VITAMIN D3) 50 MCG (2000 UT) capsule, Take 2,000 Units by mouth daily., Disp: , Rfl:    clobetasol (TEMOVATE) 0.05 % GEL, APPLY TO AFFECTED AREAS OF THE MOUTH TWICE DAILY WHILE FLARING., Disp: , Rfl:    clopidogrel (PLAVIX) 75 MG tablet, Take 1 tablet (75 mg total) by mouth daily., Disp: 90 tablet, Rfl: 3   clotrimazole (MYCELEX) 10 MG troche, Take by mouth., Disp: , Rfl:    enoxaparin (LOVENOX) 100 MG/ML injection, Inject 0.8 mLs (80 mg total) into the skin every 12 (twelve) hours., Disp: 48 mL, Rfl: 0   folic acid (FOLVITE) 1 MG tablet, Take 1 tablet (1 mg total) by mouth daily. (Patient taking differently: Take 3 mg by mouth daily.), Disp: 30 tablet, Rfl: 1   losartan (COZAAR) 25 MG tablet,  TAKE 1 TABLET BY MOUTH EVERY DAY FOR BLOOD PRESSURE, Disp: 90 tablet, Rfl: 3   metFORMIN (GLUCOPHAGE-XR) 500 MG 24 hr tablet, Take 2 tablets (1,000 mg total) by mouth 2 (two) times daily with a meal., Disp: 360 tablet, Rfl: 0   methotrexate (RHEUMATREX) 2.5 MG tablet, Take 10 mg by mouth once a week., Disp: , Rfl:    omeprazole (PRILOSEC) 20 MG capsule, Take 20 mg by mouth daily., Disp: , Rfl:    polyethylene glycol (MIRALAX / GLYCOLAX) 17 g packet, Take 17 g by mouth daily as needed for mild constipation., Disp: 14 each, Rfl: 0   predniSONE (DELTASONE) 10 MG tablet, Take 10 mg by mouth daily in the afternoon., Disp: , Rfl:    Blood Glucose Monitoring Suppl DEVI, 1 each by Does not apply route in the morning, at noon, and at bedtime. May substitute to any manufacturer covered by patient's insurance., Disp: 1 each, Rfl: 0   glucose blood test strip, Use to check blood sugar up to three times daily., Disp: 100 each, Rfl: 12   Lancet Device MISC, 1 each by Does not apply route in the morning, at noon, and at bedtime. May substitute to any manufacturer covered by patient's insurance., Disp: 1 each, Rfl: 0   Lancets Misc. MISC, 1 each by Does not apply route in the  morning, at noon, and at bedtime. May substitute to any manufacturer covered by patient's insurance., Disp: 100 each, Rfl: 0  Social History   Tobacco Use  Smoking Status Never  Smokeless Tobacco Never  Tobacco Comments   Never smoked    Allergies  Allergen Reactions   Aspirin Hives   Ibuprofen Hives   Other     Anesthesia (familial) SISTER HAD MALIGNANT HYPERTHERMIA   Propofol Other (See Comments)   Objective:  There were no vitals filed for this visit. Body mass index is 26.91 kg/m. Constitutional Well developed. Well nourished.  Vascular Dorsalis pedis pulses palpable bilaterally. Posterior tibial pulses palpable bilaterally. Capillary refill normal to all digits.  No cyanosis or clubbing noted. Pedal hair growth  normal.  Neurologic Normal speech. Oriented to person, place, and time. Epicritic sensation to light touch grossly present bilaterally.  Dermatologic Nails well groomed and normal in appearance. No open wounds. No skin lesions.  Orthopedic: Pain on palpation left lateral foot.  Pain at the insertion of the peroneal tendon.  Pain with resisted dorsiflexion eversion of the foot.  Some pain along the course of the metatarsal base as well.  No extensor or flexor tendinitis clinically appreciated   Radiographs: 3 views of skeletally mature adult left foot: No stress fracture noted no bony abnormalities noted.  Mild midfoot arthritis noted.  Hallux limitus/arthritic changes noted to the first metatarsophalangeal joint Assessment:   1. Peroneal tendinitis, left     Plan:  Patient was evaluated and treated and all questions answered.  Left peroneal tendinitis with a history of peroneal tendon repair -All questions and concerns were discussed with the patient in extensive detail -Clinically cam boot immobilization allow some subtle soft tissue structure to heal however she still has some good pain.  At this time she would benefit from ordering another MRI to assess for reinjury.  If she has any new tearing she will need surgical intervention at this time.  She states understanding.  No follow-ups on file.   Left peroneal tendinitis reaggravation order another MRI for reinjury

## 2023-07-26 DIAGNOSIS — E1165 Type 2 diabetes mellitus with hyperglycemia: Secondary | ICD-10-CM

## 2023-07-27 MED ORDER — LANCET DEVICE MISC
1.0000 | Freq: Three times a day (TID) | 0 refills | Status: AC
Start: 2023-07-27 — End: 2023-08-26

## 2023-07-27 MED ORDER — BLOOD GLUCOSE MONITORING SUPPL DEVI
1.0000 | Freq: Three times a day (TID) | 0 refills | Status: DC
Start: 2023-07-27 — End: 2024-07-13

## 2023-07-27 MED ORDER — BLOOD GLUCOSE TEST VI STRP
1.0000 | ORAL_STRIP | Freq: Three times a day (TID) | 0 refills | Status: DC
Start: 2023-07-27 — End: 2023-07-30

## 2023-07-27 MED ORDER — LANCETS MISC. MISC
1.0000 | Freq: Three times a day (TID) | 0 refills | Status: DC
Start: 2023-07-27 — End: 2023-11-16

## 2023-07-29 ENCOUNTER — Other Ambulatory Visit: Payer: Self-pay | Admitting: Primary Care

## 2023-07-29 DIAGNOSIS — E1165 Type 2 diabetes mellitus with hyperglycemia: Secondary | ICD-10-CM

## 2023-07-29 MED ORDER — ACCU-CHEK GUIDE VI STRP
ORAL_STRIP | 0 refills | Status: DC
Start: 2023-07-29 — End: 2023-07-30

## 2023-08-03 ENCOUNTER — Ambulatory Visit
Admission: RE | Admit: 2023-08-03 | Discharge: 2023-08-03 | Disposition: A | Discharge status: Home / Self Care | Payer: 59 | Source: Ambulatory Visit | Attending: Primary Care

## 2023-08-03 DIAGNOSIS — E2839 Other primary ovarian failure: Secondary | ICD-10-CM

## 2023-08-04 MED ORDER — GLUCOSE BLOOD VI STRP
ORAL_STRIP | 12 refills | Status: DC
Start: 2023-08-04 — End: 2023-11-16

## 2023-08-04 NOTE — Telephone Encounter (Signed)
Called and spoke with pharmacist, she states the One Touch is the preferred brand that they will cover. Patient would need new script sent in for the One Touch brand.  If it is going to be a CGM, they will cover Freestyle.

## 2023-08-04 NOTE — Telephone Encounter (Signed)
Heather Hudson, will you call the pharmacy to find out what's going on with her test strips. I sent a prescription for a glucometer kit to the pharmacy last week.

## 2023-08-07 ENCOUNTER — Telehealth (HOSPITAL_COMMUNITY): Payer: Self-pay | Admitting: Student

## 2023-08-07 MED ORDER — ENOXAPARIN SODIUM 100 MG/ML IJ SOSY: 1.0000 mg/kg | PREFILLED_SYRINGE | Freq: Two times a day (BID) | INTRAMUSCULAR | 0 refills | Status: DC

## 2023-08-07 NOTE — Telephone Encounter (Signed)
Patient called Heather Hudson today stating she was almost out of lovenox and needed approximately 2 more week's worth to complete the 30 days of therapy Dr. Elby Showers wanted her to have.   A prescription for lovenox was e-prescribed for 15 more days. Patient knows she can call our team with any questions/concerns prior to her visit with Dr. Elby Showers.  Alwyn Ren, [place] [phone] 08/07/2023, 12:01 PM

## 2023-08-14 ENCOUNTER — Ambulatory Visit (HOSPITAL_COMMUNITY)
Admission: RE | Admit: 2023-08-14 | Discharge: 2023-08-14 | Disposition: A | Discharge status: Home / Self Care | Payer: PPO | Source: Ambulatory Visit | Attending: Interventional Radiology | Admitting: Interventional Radiology

## 2023-08-14 DIAGNOSIS — I871 Compression of vein: Secondary | ICD-10-CM | POA: Diagnosis present

## 2023-08-14 MED ORDER — SODIUM CHLORIDE (PF) 0.9 % IJ SOLN
INTRAMUSCULAR | Status: AC
  Filled 2023-08-14: qty 50

## 2023-08-14 MED ORDER — IOHEXOL 350 MG/ML SOLN
100.0000 mL | Freq: Once | INTRAVENOUS | Status: AC | PRN
  Administered 2023-08-14: 100 mL via INTRAVENOUS

## 2023-08-18 ENCOUNTER — Encounter: Payer: Self-pay | Admitting: Primary Care

## 2023-08-18 ENCOUNTER — Ambulatory Visit (INDEPENDENT_AMBULATORY_CARE_PROVIDER_SITE_OTHER): Payer: PPO | Admitting: Primary Care

## 2023-08-18 VITALS — BP 132/82 | HR 72 | Temp 97.6°F | Ht 68.0 in | Wt 172.0 lb

## 2023-08-18 DIAGNOSIS — E1165 Type 2 diabetes mellitus with hyperglycemia: Secondary | ICD-10-CM | POA: Diagnosis not present

## 2023-08-18 DIAGNOSIS — R1900 Intra-abdominal and pelvic swelling, mass and lump, unspecified site: Secondary | ICD-10-CM | POA: Diagnosis not present

## 2023-08-18 DIAGNOSIS — I2609 Other pulmonary embolism with acute cor pulmonale: Secondary | ICD-10-CM

## 2023-08-18 HISTORY — DX: Intra-abdominal and pelvic swelling, mass and lump, unspecified site: R19.00

## 2023-08-18 LAB — POCT GLYCOSYLATED HEMOGLOBIN (HGB A1C): Hemoglobin A1C: 4.8 % (ref 4.0–5.6)

## 2023-08-18 NOTE — Patient Instructions (Signed)
Stop by the lab prior to leaving today. I will notify you of your results once received.   You will receive a phone call regarding your ultrasound.   Please schedule a follow up visit for 3 months.  It was a pleasure to see you today!

## 2023-08-18 NOTE — Progress Notes (Signed)
Subjective:    Patient ID: Heather Hudson, female    DOB: October 12, 1955, 67 y.o.   MRN: [id number]  Diabetes Pertinent negatives for hypoglycemia include no dizziness. Associated symptoms include fatigue. Pertinent negatives for diabetes include no chest pain.    Heather Hudson is a very pleasant 67 y.o. female with a history of hypertension, bilateral pulmonary embolism, DVT, type 2 diabetes, mucous membrane pemphigoid who presents today for follow-up of chronic conditions.  Her husband joins [place] today  1) Type 2 Diabetes:  Current medications include: Metformin XR 1000 mg in the AM and 500 mg in PM.   She's experiencing nausea, GI upset almost everyday. She is no longer on prednisone.   She is checking her blood glucose 0 times daily.  Last A1C: 7.6 in August 2024, 4.8 today Last Eye Exam: Due Last Foot Exam: Due Pneumonia Vaccination: Never completed Urine Microalbumin: Due Statin: None.  Dietary changes since last visit: Reduced appetite.    Exercise: None  Wt Readings from Last 3 Encounters:  08/18/23 172 lb (78 kg)  07/22/23 177 lb (80.3 kg)  07/16/23 177 lb (80.3 kg)   BP Readings from Last 3 Encounters:  08/18/23 132/82  07/16/23 116/83  07/06/23 126/85     2) Bilateral PE/DVT: Hospital admission in August to 2024 for progressing exertional dyspnea.  Workup revealed acute bilateral pulmonary embolism and extensive left lower extremity DVT.  Evaluated by hematology in August 2024, hypercoagulability workup was negative for factor V Leiden gene mutation.  She had normal protein C, protein S, Antithrombin levels.  IgM anticardiolipin antibody was indeterminate.  The plan is to continue apixaban 5 mg twice daily for now.  She will undergo repeat CT a chest, D-dimer, cardiolipin antibody in February 2025.  If negative then anticoagulation will be discontinued.  Evaluated by cardiology in September 2024 for abnormal EKG.  She underwent CT coronary scan in July 2024 with a  score of 0.  Echocardiogram in August 2024 with normal LVEF, mild reduction in right ventricular function, moderate pulmonary hypertension.  No changes were made to her regimen.  She underwent IVC filter placement with thrombectomy/angioplasty of the left iliac and femoral veins, left iliac vein stent placement in August 2024. She underwent venogram and aspiration thrombectomy of left lower extremity and indwelling stent on 07/16/2023 per interventional radiology.  Today she's feeling better. She denies chest pain, dyspnea.  Her Eliquis 5 mg was discontinued one month ago, has been on Lovenox injections since. She is to resume Eliquis at some point. Will be meeting with her interventional radiology provider later this week to review the CT venogram obtained last week.  3) Skin Masses: Chronic.  Two masses located to the bilateral lower abdomen which have been there for years.  She denies changes in size, she denies pain.  Review of Systems  Constitutional:  Positive for fatigue.  Respiratory:  Negative for shortness of breath.   Cardiovascular:  Negative for chest pain.  Gastrointestinal:  Positive for nausea. Negative for vomiting.  Neurological:  Negative for dizziness and numbness.         Past Medical History:  Diagnosis Date   Adenomatous colon polyp 2015   Allergy 1974   teenager, and 2008   Colovesical fistula 2015   COVID-19 06/2020   Environmental and seasonal allergies    Hx of adenomatous colonic polyps 01/19/2021   Hypertension 11/23   Low dose prescription   Lower abdominal pain 12/23/2019   Migraines    Motion  sickness    Persistent cough for 3 weeks or longer 10/10/2021    Social History   Socioeconomic History   Marital status: Married    Spouse name: Not on file   Number of children: Not on file   Years of education: Not on file   Highest education level: Associate degree: academic program  Occupational History   Not on file  Tobacco Use   Smoking  status: Never   Smokeless tobacco: Never   Tobacco comments:    Never smoked  Vaping Use   Vaping status: Never Used  Substance and Sexual Activity   Alcohol use: Yes    Alcohol/week: 5.0 standard drinks of alcohol    Types: 5 Glasses of wine per week   Drug use: Never   Sexual activity: Yes    Birth control/protection: Post-menopausal  Other Topics Concern   Not on file  Social History Narrative   Married.  No children.   [place]oved [place] in 2018    Worked in [employer], Insurance account manager.  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   Enjoys wine tasting.    1-2 caffeinated drinks a day 0-1 alcoholic never smoker no drug use   Sister Mardene Celeste works Adult nurse endoscopy as Charity fundraiser   Social Determinants of Corporate investment banker Strain: Low Risk  (08/14/2023)   Overall Financial Resource Strain (CARDIA)    Difficulty of Paying Living Expenses: Not hard at all  Food Insecurity: No Food Insecurity (08/14/2023)   Hunger Vital Sign    Worried About Running Out of Food in the Last Year: Never true    Ran Out of Food in the Last Year: Never true  Transportation Needs: No Transportation Needs (08/14/2023)   PRAPARE - Administrator, Civil Service (Medical): No    Lack of Transportation (Non-Medical): No  Physical Activity: Unknown (08/14/2023)   Exercise Vital Sign    Days of Exercise per Week: 0 days    Minutes of Exercise per Session: Not on file  Stress: No Stress Concern Present (08/14/2023)   [employer] of Occupational Health - Occupational Stress Questionnaire    Feeling of Stress : Not at all  Social Connections: Socially Integrated (08/14/2023)   Social Connection and Isolation Panel [NHANES]    Frequency of Communication with Friends and Family: More than three times a week    Frequency of Social Gatherings with Friends and Family: More than three times a week     Attends Religious Services: More than 4 times per year    Active Member of Clubs or Organizations: Yes    Attends Banker Meetings: More than 4 times per year    Marital Status: Married  Catering manager Violence: Not At Risk (05/16/2023)   Humiliation, Afraid, Rape, and Kick questionnaire    Fear of Current or Ex-Partner: No    Emotionally Abused: No    Physically Abused: No    Sexually Abused: No    Past Surgical History:  Procedure Laterality Date   COLON SURGERY  2016   fistula, small section removed   COLONOSCOPY  2015   Plus others   IR INTRAVASCULAR ULTRASOUND NON CORONARY  05/19/2023   IR INTRAVASCULAR ULTRASOUND NON CORONARY  07/16/2023   IR IVC FILTER PLMT / S&I /IMG GUID/MOD SED  05/19/2023   IR PTA VENOUS EXCEPT DIALYSIS CIRCUIT  05/19/2023  IR PTA VENOUS EXCEPT DIALYSIS CIRCUIT  07/16/2023   IR RADIOLOGIST EVAL & MGMT  07/01/2023   IR THROMBECT VENO MECH MOD SED  05/19/2023   IR THROMBECT VENO MECH MOD SED  07/16/2023   IR TRANSCATH PLC STENT 1ST ART NOT LE CV CAR VERT CAR  05/19/2023   IR TRANSCATH PLC STENT 1ST ART NOT LE CV CAR VERT CAR  07/16/2023   IR US GUIDE VASC ACCESS LEFT  05/19/2023   IR US GUIDE VASC ACCESS LEFT  07/16/2023   IR US GUIDE VASC ACCESS RIGHT  05/19/2023   IR VENO/EXT/UNI LEFT  05/19/2023   IR VENO/EXT/UNI LEFT  07/16/2023   IR VENOCAVAGRAM IVC  05/19/2023   IR VENOCAVAGRAM IVC  07/16/2023   LAPAROSCOPIC LEFT COLON RESECTION  2015   Colovesical fistula, [place]   Left peroneal tendon repair Left 2023   WISDOM TOOTH EXTRACTION      Family History  Problem Relation Age of Onset   Heart disease Mother    Diabetes Mother    Hearing loss Mother    Heart disease Father    Prostate cancer Father    Alcohol abuse Father    Cancer Father    COPD Father    Malignant hyperthermia Sister    Pancreatic cancer Maternal Aunt    Diabetes Brother    Cancer Maternal Grandmother    Miscarriages / Stillbirths Maternal Grandmother     Diabetes Brother    Intellectual disability Brother    Learning disabilities Brother    Vision loss Brother    Miscarriages / Stillbirths Sister    Miscarriages / [place] Sister    Colon cancer Neg Hx    Esophageal cancer Neg Hx    Stomach cancer Neg Hx    Liver disease Neg Hx    Breast cancer Neg Hx     Allergies  Allergen Reactions   Aspirin Hives   Ibuprofen Hives   Other     Anesthesia (familial) SISTER HAD MALIGNANT HYPERTHERMIA   Propofol Other (See Comments)    Current Outpatient Medications on File Prior to Visit  Medication Sig Dispense Refill   Blood Glucose Monitoring Suppl DEVI 1 each by Does not apply route in the morning, at noon, and at bedtime. May substitute to any manufacturer covered by patient's insurance. 1 each 0   calcium-vitamin D (OSCAL WITH D) 500-5 MG-MCG tablet Take 1 tablet by mouth daily with breakfast. 90 tablet 1   Cholecalciferol (VITAMIN D3) 50 MCG (2000 UT) capsule Take 2,000 Units by mouth daily.     clobetasol (TEMOVATE) 0.05 % GEL APPLY TO AFFECTED AREAS OF THE MOUTH TWICE DAILY WHILE FLARING.     clotrimazole (MYCELEX) 10 MG troche Take by mouth.     enoxaparin (LOVENOX) 100 MG/ML injection Inject 0.8 mLs (80 mg total) into the skin every 12 (twelve) hours for 15 days. 24 mL 0   glucose blood test strip Use to check blood sugar up to three times daily. 100 each 12   Lancet Device MISC 1 each by Does not apply route in the morning, at noon, and at bedtime. May substitute to any manufacturer covered by patient's insurance. 1 each 0   Lancets Misc. MISC 1 each by Does not apply route in the morning, at noon, and at bedtime. May substitute to any manufacturer covered by patient's insurance. 100 each 0   losartan (COZAAR) 25 MG tablet TAKE 1 TABLET BY MOUTH EVERY DAY FOR BLOOD PRESSURE 90 tablet 3  omeprazole (PRILOSEC) 20 MG capsule Take 20 mg by mouth daily.     clopidogrel (PLAVIX) 75 MG tablet Take 1 tablet (75 mg total) by mouth daily.  (Patient not taking: Reported on 08/18/2023) 90 tablet 3   No current facility-administered medications on file prior to visit.    BP 132/82 (BP Location: Left Arm, Patient Position: Sitting, Cuff Size: Normal)   Pulse 72   Temp 97.6 F (36.4 C) (Oral)   Ht 5\' 8"  (1.727 m)   Wt 172 lb (78 kg)   SpO2 96%   BMI 26.15 kg/m  Objective:   Physical Exam Cardiovascular:     Rate and Rhythm: Normal rate and regular rhythm.  Pulmonary:     Effort: Pulmonary effort is normal.     Breath sounds: Normal breath sounds.  Musculoskeletal:     Cervical back: Neck supple.  Skin:    General: Skin is warm and dry.     Comments: 3X1 cm superficial soft tissue mass to the right lower abdomen.  Nontender  0.5 cm rounded superficial soft tissue mass to the left lower abdomen.  Nontender  Neurological:     Mental Status: She is alert and oriented to person, place, and time.  Psychiatric:        Mood and Affect: Mood normal.           Assessment & Plan:  Type 2 diabetes mellitus with hyperglycemia, without long-term current use of insulin (HCC) Assessment & Plan: Significant improvement with A1C of 4.8 today.  Stop metformin due to GI upset and significant improvement in A1C.  We decided to discontinue all diabetes treatment for now. She will monitor glucose levels and notify if readings increase consistently above 130.   Foot exam today. Urine microalbumin due and pending.    Orders: -     POCT glycosylated hemoglobin (Hb A1C) -     Microalbumin / creatinine urine ratio  Mass of soft tissue of abdomen Assessment & Plan: Exam representative of lipoma/benign cyst.  Soft tissue ultrasound of the abdomen ordered and pending.  Orders: -     US Abdomen Limited; Future  Acute pulmonary embolism with acute cor pulmonale, unspecified pulmonary embolism type [facility]) Assessment & Plan: Recent hospitalization in August 2024. Hospital notes, labs, imaging reviewed.  Continue Lovenox  injections and clopidogrel 75 mg daily per interventional radiology. Switch to Eliquis 5 mg twice daily when instructed.  Reviewed hematology notes from August 2024 and cardiology notes from September 2024.  CT venogram abdomen pending radiology read.         [name], NP

## 2023-08-18 NOTE — Assessment & Plan Note (Signed)
Significant improvement with A1C of 4.8 today.  Stop metformin due to GI upset and significant improvement in A1C.  We decided to discontinue all diabetes treatment for now. She will monitor glucose levels and notify if readings increase consistently above 130.   Foot exam today. Urine microalbumin due and pending.

## 2023-08-18 NOTE — Assessment & Plan Note (Signed)
Exam representative of lipoma/benign cyst.  Soft tissue ultrasound of the abdomen ordered and pending.

## 2023-08-18 NOTE — Assessment & Plan Note (Signed)
Recent hospitalization in August 2024. Hospital notes, labs, imaging reviewed.  Continue Lovenox injections and clopidogrel 75 mg daily per interventional radiology. Switch to Eliquis 5 mg twice daily when instructed.  Reviewed hematology notes from August 2024 and cardiology notes from September 2024.  CT venogram abdomen pending radiology read.

## 2023-08-19 ENCOUNTER — Ambulatory Visit
Admission: RE | Admit: 2023-08-19 | Discharge: 2023-08-19 | Disposition: A | Discharge status: Home / Self Care | Payer: PPO | Source: Ambulatory Visit | Attending: Primary Care | Admitting: Primary Care

## 2023-08-19 ENCOUNTER — Ambulatory Visit: Payer: PPO | Admitting: Podiatry

## 2023-08-19 DIAGNOSIS — R1900 Intra-abdominal and pelvic swelling, mass and lump, unspecified site: Secondary | ICD-10-CM

## 2023-08-19 LAB — MICROALBUMIN / CREATININE URINE RATIO
Creatinine,U: 152.2 mg/dL
Microalb Creat Ratio: 1.4 mg/g (ref 0.0–30.0)
Microalb, Ur: 2.1 mg/dL — ABNORMAL HIGH (ref 0.0–1.9)

## 2023-08-19 NOTE — Progress Notes (Signed)
Reason for follow up:  The patient is seen in virtual follow up today s/p IVC filter placement, thrombectomy/angioplasty of left iliac and femoral veins.   Referring Physician(s): Dr. Berton Mount, Dr. Serena Croissant   History of present illness: HPI from last clinic visit 07/01/23 Heather Hudson is a 67 year old female with a medical history significant for HTN, colovesical fistula s/p repair and mucous membrane pemphigoid. She presented to the ED in early August with worsening dyspnea and fatigue that had started after she returned from a trip to [place] in late June. CTA chest showed bilateral pulmonary emboli involving the right main pulmonary artery and numerous lobar, segmental and subsegmental pulmonary arteries bilaterally. She was started in a heparin infusion while undergoing additional work up. A lower extremity ultrasound was positive for a left lower extremity DVT. She was managed conservatively for a few days until she developed worsening swelling of the left lower extremity from the toes to thigh. IR was consulted and our team requested a CT venogram abdomen/pelvis for further evaluation. This showed an acute appearing thrombus extending from the left common femoral vein to the junction of the left common iliac vein and IVC with concern for May-Thurner morphology.   On 05/19/23 I performed a left lower extremity venogram, inferior venocavogram, IVC filter placement and left iliofemoral extremity aspiration thrombectomy with balloon angioplasty of left iliofemoral veins. I also placed a stent from the left common through left external iliac veins.  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   She was discharged from the hospital 05/22/23 on Eliquis and was scheduled for outpatient follow up with IR including new imaging. She  presents today for follow up via virtual tele-visit.   States she's feeling well and has been very active since the procedure.  No pain in the left leg. She has ankle pain which is attributed to a podiatric issue, prior tendon surgery.  No redness or heaviness.  No firmness or skin changes.  Is wearing compression stocking on left leg.  She continues to take eliquis, no missed doses.  Non smoker.  A repeat CT venogram 06/23/23 unfortunately showed she had experienced thrombosis of the central and peripheral aspects of the left iliac vein stent and demonstrated non-occlusive thrombus throughout the left popliteal through common femoral veins. Thankfully, she remained asymptomatic and reported feeling much better. Given the extent of thrombus we discussed treatment options including continuing anticoagulation alone versus additional intervention. I recommend repeat aspiration thrombectomy of the left lower extremity and indwelling stent to re-establish flow.  She was agreement with this plan the procedure was performed 07/16/23. She underwent left lower extremity venogram with left iliofemoral aspiration thrombectomy, balloon angioplasty and stent placement. She tolerated the procedure well and was discharged home the same day. Post-procedure she was started on 1 mg/kg lovenox BID and Plavix 75 mg daily. The patient has been holding her Eliquis while on lovenox and Plavix.   She presents today for follow up via virtual tele-health visit. She is now off metformin and feeling much better with resolution of nausea.  She's now off steroids and methotrexate that were prescribed for an autoimmune gum disorder.  Left ankle MRI yesterday.  She has done well from a breathing and ambulation standpoint.  She has remained compliant with lovenox and plavix.  The lovenox shots have been causing subcutaneous nodules which  are tender.  No left lower extremity pain, swelling, or redness.  Past Medical History:  Diagnosis Date    Adenomatous colon polyp 2015   Allergy 1974   teenager, and 2008   Colovesical fistula 2015   COVID-19 06/2020   Environmental and seasonal allergies    Hx of adenomatous colonic polyps 01/19/2021   Hypertension 11/23   Low dose prescription   Lower abdominal pain 12/23/2019   Migraines    Motion sickness    Persistent cough for 3 weeks or longer 10/10/2021    Past Surgical History:  Procedure Laterality Date   COLON SURGERY  2016   fistula, small section removed   COLONOSCOPY  2015   Plus others   IR INTRAVASCULAR ULTRASOUND NON CORONARY  05/19/2023   IR INTRAVASCULAR ULTRASOUND NON CORONARY  07/16/2023   IR IVC FILTER PLMT / S&I /IMG GUID/MOD SED  05/19/2023   IR PTA VENOUS EXCEPT DIALYSIS CIRCUIT  05/19/2023   IR PTA VENOUS EXCEPT DIALYSIS CIRCUIT  07/16/2023   IR RADIOLOGIST EVAL & MGMT  07/01/2023   IR THROMBECT VENO MECH MOD SED  05/19/2023   IR THROMBECT VENO MECH MOD SED  07/16/2023   IR TRANSCATH PLC STENT 1ST ART NOT LE CV CAR VERT CAR  05/19/2023   IR TRANSCATH PLC STENT 1ST ART NOT LE CV CAR VERT CAR  07/16/2023   IR US GUIDE VASC ACCESS LEFT  05/19/2023   IR US GUIDE VASC ACCESS LEFT  07/16/2023   IR US GUIDE VASC ACCESS RIGHT  05/19/2023   IR VENO/EXT/UNI LEFT  05/19/2023   IR VENO/EXT/UNI LEFT  07/16/2023   IR VENOCAVAGRAM IVC  05/19/2023   IR VENOCAVAGRAM IVC  07/16/2023   LAPAROSCOPIC LEFT COLON RESECTION  2015   Colovesical fistula, [place]   Left peroneal tendon repair Left 2023   WISDOM TOOTH EXTRACTION      Allergies: Aspirin, Ibuprofen, Other, and Propofol  Medications: Prior to Admission medications   Medication Sig Start Date End Date Taking? Authorizing Provider  Blood Glucose Monitoring Suppl DEVI 1 each by Does not apply route in the morning, at noon, and at bedtime. May substitute to any manufacturer covered by patient's insurance. 07/27/23   [name], NP  calcium-vitamin D [place] Rummage WITH D) 500-5 MG-MCG tablet Take 1 tablet by mouth daily  with breakfast. 07/06/23   [name], NP  Cholecalciferol (VITAMIN D3) 50 MCG (2000 UT) capsule Take 2,000 Units by mouth daily.    [provider]  clobetasol (TEMOVATE) 0.05 % GEL APPLY TO AFFECTED AREAS OF THE MOUTH TWICE DAILY WHILE FLARING. 06/18/23   [provider]  clopidogrel (PLAVIX) 75 MG tablet Take 1 tablet (75 mg total) by mouth daily. Patient not taking: Reported on 08/18/2023 07/16/23 07/10/24  Mickie Kay, NP  clotrimazole (MYCELEX) 10 MG troche Take by mouth. 06/18/23   [provider]  enoxaparin (LOVENOX) 100 MG/ML injection Inject 0.8 mLs (80 mg total) into the skin every 12 (twelve) hours for 15 days. 08/07/23 08/22/23  Mickie Kay, NP  glucose blood test strip Use to check blood sugar up to three times daily. 08/04/23   [name], NP  Lancet Device MISC 1 each by Does not apply route in the morning, at noon, and at bedtime. May substitute to any manufacturer covered by patient's insurance. 07/27/23 08/26/23  [name], NP  Lancets Misc. MISC 1 each by Does not apply route in the morning, at noon, and at  bedtime. May substitute to any manufacturer covered by patient's insurance. 07/27/23   [name], NP  losartan (COZAAR) 25 MG tablet TAKE 1 TABLET BY MOUTH EVERY DAY FOR BLOOD PRESSURE 01/21/23   [name], NP  omeprazole (PRILOSEC) 20 MG capsule Take 20 mg by mouth daily.    [provider]     Family History  Problem Relation Age of Onset   Heart disease Mother    Diabetes Mother    Hearing loss Mother    Heart disease Father    Prostate cancer Father    Alcohol abuse Father    Cancer Father    COPD Father    Malignant hyperthermia Sister    Pancreatic cancer Maternal Aunt    Diabetes Brother    Cancer Maternal Grandmother    Miscarriages / Stillbirths Maternal Grandmother    Diabetes Brother    Intellectual disability Brother    Learning disabilities Brother    Vision loss  Brother    Miscarriages / Stillbirths Sister    Miscarriages / [place] Sister    Colon cancer Neg Hx    Esophageal cancer Neg Hx    Stomach cancer Neg Hx    Liver disease Neg Hx    Breast cancer Neg Hx     Social History   Socioeconomic History   Marital status: Married    Spouse name: Not on file   Number of children: Not on file   Years of education: Not on file   Highest education level: Associate degree: academic program  Occupational History   Not on file  Tobacco Use   Smoking status: Never   Smokeless tobacco: Never   Tobacco comments:    Never smoked  Vaping Use   Vaping status: Never Used  Substance and Sexual Activity   Alcohol use: Yes    Alcohol/week: 5.0 standard drinks of alcohol    Types: 5 Glasses of wine per week   Drug use: Never   Sexual activity: Yes    Birth control/protection: Post-menopausal  Other Topics Concern   Not on file  Social History Narrative   Married.  No children.   Moved from [place] area of [place] in 2018    Worked in [employer], Insurance account manager.  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   Enjoys wine tasting.    1-2 caffeinated drinks a day 0-1 alcoholic never smoker no drug use   Sister Mardene Celeste works Adult nurse endoscopy as Charity fundraiser   Social Determinants of Corporate investment banker Strain: Low Risk  (08/14/2023)   Overall Financial Resource Strain (CARDIA)    Difficulty of Paying Living Expenses: Not hard at all  Food Insecurity: No Food Insecurity (08/14/2023)   Hunger Vital Sign    Worried About Running Out of Food in the Last Year: Never true    Ran Out of Food in the Last Year: Never true  Transportation Needs: No Transportation Needs (08/14/2023)   PRAPARE - Administrator, Civil Service (Medical): No    Lack of Transportation (Non-Medical): No  Physical Activity: Unknown (08/14/2023)   Exercise Vital Sign    Days of Exercise  per Week: 0 days    Minutes of Exercise per Session: Not on file  Stress: No Stress Concern Present (08/14/2023)   [employer] of Occupational Health - Occupational Stress Questionnaire    Feeling of Stress : Not at  all  Social Connections: Socially Integrated (08/14/2023)   Social Connection and Isolation Panel [NHANES]    Frequency of Communication with Friends and Family: More than three times a week    Frequency of Social Gatherings with Friends and Family: More than three times a week    Attends Religious Services: More than 4 times per year    Active Member of [employer] or Organizations: Yes    Attends Engineer, structural: More than 4 times per year    Marital Status: Married     Vital Signs: There were no vitals taken for this visit.  No physical exam was performed in lieu of virtual telephone visit.    Imaging: CT AP 06/23/23  Occlusion of the central and peripeheral aspects of indwelling left iliac vein stent.  Possible small focal thormbus adherent to apex of IVC filter.  Non-occlusive DVT extending through left CFV, femoral, and popliteal veins with patent calf veins on duplex.   Thrombectomy and stent extension (07/16/23) IMPRESSION: 1. Complete, chronic appearing occlusion throughout the left femoral vein, common femoral vein, external iliac vein, and indwelling common external iliac vein stent. Additionally, there is a focal, wall adherent thrombus just superior to the indwelling inferior vena cava filter. 2. Technically successful catheter and wire recanalization, aspiration thrombectomy, and balloon venoplasty of the iliofemoral veins. 3. Technically successful drug coated balloon venoplasty of the iliofemoral veins. 4. Technically successful external iliac common femoral vein self expanding stent placement.   CTV AP 08/14/23  Widely patent left iliac stent construct.  Slightly enlarged non-occlusive thrombus about apex of IVC  filter.  Labs:  CBC: Recent Labs    05/19/23 0119 05/20/23 0101 05/21/23 0643 07/16/23 0927  WBC 6.6 7.1 6.1 6.2  HGB 10.0* 9.0* 8.7* 12.7  HCT 30.0* 26.7* 25.5* 38.2  PLT 285 297 265 190    COAGS: Recent Labs    05/14/23 1930 05/15/23 0146 07/16/23 0927  INR 1.1 1.1 1.1  APTT 32 65*  --     BMP: Recent Labs    05/20/23 1245 05/21/23 0645 05/22/23 1104 07/16/23 0927  NA 139 138 136 142  K 3.4* 2.8* 3.4* 3.5  CL 107 110 107 112*  CO2 20* 20* 19* 20*  GLUCOSE 211* 112* 210* 94  BUN 7* 7* 11 9  CALCIUM 9.5 9.1 9.4 10.3  CREATININE 0.66 0.51 0.59 0.71  GFRNONAA >60 >60 >60 >60    LIVER FUNCTION TESTS: Recent Labs    01/20/23 0830 05/14/23 1749 05/15/23 0146 05/18/23 1129 05/20/23 1245 05/21/23 0645 05/22/23 1104  BILITOT 0.6 1.5* 1.3*  --   --   --   --   AST 22 26 21   --   --   --   --   ALT 42* 42 39  --   --   --   --   ALKPHOS 80 74 74  --   --   --   --   PROT 6.9 6.5 5.9*  --   --   --   --   ALBUMIN 4.3 3.7 3.0* 2.6* 2.6* 2.4* 2.6*    Assessment and Plan:  67 year old female with 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 She underwent IVC filter placement with thrombectomy/angioplasty of left iliac and femoral veins and left iliac vein stent placement on 05/19/23.  Unfortunately she experienced thrombosis of the central and peripheral aspects of the left iliac vein stent with non-occlusive thrombus  throughout the left popliteal through common femoral veins. She underwent left lower extremity venogram with left iliofemoral aspiration thrombectomy, balloon angioplasty and stent placement 07/16/23. She tolerated the procedure well and was discharged home the same day. Post-procedure she was started on 1 mg/kg lovenox BID and Plavix 75 mg daily.  1 month follow up CTV demonstrates patency of the stents with slight interval growth of focal IVC thrombus adherent to indwelling IVC  filter.  She remains asymptomatic in the left lower extremity.  -transition back to eliquis 5 mg BID, discontinue lovenox -continue plavix -plan for IVC filter retrieval at [facility], moderate sedation  Marliss Coots, MD Pager: [phone]    I spent a total of 25 Minutes in virtual telephone clinical consultation, greater than 50% of which was counseling/coordinating care for history of May Thurner Syndrome.

## 2023-08-20 ENCOUNTER — Ambulatory Visit
Admission: RE | Admit: 2023-08-20 | Discharge: 2023-08-20 | Disposition: A | Discharge status: Home / Self Care | Payer: PPO | Source: Ambulatory Visit | Attending: Podiatry | Admitting: Podiatry

## 2023-08-20 DIAGNOSIS — M7672 Peroneal tendinitis, left leg: Secondary | ICD-10-CM

## 2023-08-21 ENCOUNTER — Telehealth (HOSPITAL_COMMUNITY): Payer: Self-pay | Admitting: Student

## 2023-08-21 ENCOUNTER — Ambulatory Visit
Admission: RE | Admit: 2023-08-21 | Discharge: 2023-08-21 | Disposition: A | Discharge status: Home / Self Care | Payer: PPO | Source: Ambulatory Visit | Attending: Student | Admitting: Student

## 2023-08-21 DIAGNOSIS — I2609 Other pulmonary embolism with acute cor pulmonale: Secondary | ICD-10-CM

## 2023-08-21 HISTORY — PX: IR RADIOLOGIST EVAL & MGMT: IMG5224

## 2023-08-21 MED ORDER — APIXABAN 5 MG PO TABS: 5.0000 mg | ORAL_TABLET | Freq: Two times a day (BID) | ORAL | 6 refills | Status: DC

## 2023-08-21 NOTE — Telephone Encounter (Signed)
Eliquis 5 mg BID x 30 days with 6 refills e-prescribed to [facility] on [address].  Alwyn Ren, [place] [phone] 08/21/2023, 3:40 PM

## 2023-08-25 ENCOUNTER — Other Ambulatory Visit (HOSPITAL_COMMUNITY): Payer: Self-pay | Admitting: Interventional Radiology

## 2023-08-25 ENCOUNTER — Telehealth (HOSPITAL_COMMUNITY): Payer: Self-pay

## 2023-08-25 DIAGNOSIS — Z95828 Presence of other vascular implants and grafts: Secondary | ICD-10-CM

## 2023-08-25 NOTE — Telephone Encounter (Signed)
Called to schedule ivc filter retrieval, no answer, left vm. AB

## 2023-09-09 ENCOUNTER — Encounter: Payer: Self-pay | Admitting: Podiatry

## 2023-09-09 ENCOUNTER — Ambulatory Visit: Payer: PPO | Admitting: Podiatry

## 2023-09-09 DIAGNOSIS — M7672 Peroneal tendinitis, left leg: Secondary | ICD-10-CM | POA: Diagnosis not present

## 2023-09-09 NOTE — Progress Notes (Signed)
Subjective:  Patient ID: Heather Hudson, female    DOB: 06-Dec-1955,  MRN: [id number]  Chief Complaint  Patient presents with   Routine Post Op    PATIENT STATES "SHE HAD A MRI DONE AND IS HERE FOR THE RESULTS AND NEXT STEPS " PATIENT STATES SHE DOES HAVE PAIN IN HER LEFT ANKLE , IT COMES AND GOES.    67 y.o. female presents with the above complaint.  Patient presents for follow-up of left peroneal tendinitis.  She is here to go over the results denies any other acute complaints.   Review of Systems: Negative except as noted in the HPI. Denies N/V/F/Ch.  Past Medical History:  Diagnosis Date   Adenomatous colon polyp 2015   Allergy 1974   teenager, and 2008   Colovesical fistula 2015   COVID-19 06/2020   Environmental and seasonal allergies    Hx of adenomatous colonic polyps 01/19/2021   Hypertension 11/23   Low dose prescription   Lower abdominal pain 12/23/2019   Migraines    Motion sickness    Persistent cough for 3 weeks or longer 10/10/2021    Current Outpatient Medications:    apixaban (ELIQUIS) 5 MG TABS tablet, Take 1 tablet (5 mg total) by mouth 2 (two) times daily., Disp: 60 tablet, Rfl: 6   Blood Glucose Monitoring Suppl DEVI, 1 each by Does not apply route in the morning, at noon, and at bedtime. May substitute to any manufacturer covered by patient's insurance., Disp: 1 each, Rfl: 0   calcium-vitamin D (OSCAL WITH D) 500-5 MG-MCG tablet, Take 1 tablet by mouth daily with breakfast., Disp: 90 tablet, Rfl: 1   Cholecalciferol (VITAMIN D3) 50 MCG (2000 UT) capsule, Take 2,000 Units by mouth daily., Disp: , Rfl:    clobetasol (TEMOVATE) 0.05 % GEL, APPLY TO AFFECTED AREAS OF THE MOUTH TWICE DAILY WHILE FLARING., Disp: , Rfl:    clopidogrel (PLAVIX) 75 MG tablet, Take 1 tablet (75 mg total) by mouth daily., Disp: 90 tablet, Rfl: 3   clotrimazole (MYCELEX) 10 MG troche, Take by mouth., Disp: , Rfl:    glucose blood test strip, Use to check blood sugar up to three times  daily., Disp: 100 each, Rfl: 12   Lancets Misc. MISC, 1 each by Does not apply route in the morning, at noon, and at bedtime. May substitute to any manufacturer covered by patient's insurance., Disp: 100 each, Rfl: 0   losartan (COZAAR) 25 MG tablet, TAKE 1 TABLET BY MOUTH EVERY DAY FOR BLOOD PRESSURE, Disp: 90 tablet, Rfl: 3   omeprazole (PRILOSEC) 20 MG capsule, Take 20 mg by mouth daily., Disp: , Rfl:    enoxaparin (LOVENOX) 100 MG/ML injection, Inject 0.8 mLs (80 mg total) into the skin every 12 (twelve) hours for 15 days., Disp: 24 mL, Rfl: 0  Social History   Tobacco Use  Smoking Status Never  Smokeless Tobacco Never  Tobacco Comments   Never smoked    Allergies  Allergen Reactions   Aspirin Hives   Ibuprofen Hives   Other     Anesthesia (familial) SISTER HAD MALIGNANT HYPERTHERMIA   Propofol Other (See Comments)   Objective:  There were no vitals filed for this visit. There is no height or weight on file to calculate BMI. Constitutional Well developed. Well nourished.  Vascular Dorsalis pedis pulses palpable bilaterally. Posterior tibial pulses palpable bilaterally. Capillary refill normal to all digits.  No cyanosis or clubbing noted. Pedal hair growth normal.  Neurologic Normal speech. Oriented to person,  place, and time. Epicritic sensation to light touch grossly present bilaterally.  Dermatologic Nails well groomed and normal in appearance. No open wounds. No skin lesions.  Orthopedic: Pain on palpation left lateral foot.  Pain at the insertion of the peroneal tendon.  Pain with resisted dorsiflexion eversion of the foot.  Some pain along the course of the metatarsal base as well.  No extensor or flexor tendinitis clinically appreciated   Radiographs: 3 views of skeletally mature adult left foot: No stress fracture noted no bony abnormalities noted.  Mild midfoot arthritis noted.  Hallux limitus/arthritic changes noted to the first metatarsophalangeal  joint  IMPRESSION: 1. Very poorly visualized segment of the peroneus brevis below the lateral malleolus suspicious for tear. Diminutive residual tendon extending to the base of the fifth metatarsal. 2. Mild distal tibialis posterior tendinopathy. 3. Mild degenerative chondral thinning in the subtalar joints. 4. Mild subcutaneous edema overlying the mediolateral malleoli. Assessment:   No diagnosis found.   Plan:  Patient was evaluated and treated and all questions answered.  Left peroneal tendinitis with a history of peroneal tendon repair -All questions and concerns were discussed with the patient in extensive detail -MRI was reviewed with the patient which shows some new tearing with the peroneal brevis tendon.  I discussed this briefly with the patient I believe she will ultimately benefit from tenodesis of peroneal brevis to longus as there is some thinning as well as attenuation of the tendon noted.  At this time she does have some good strength in the peroneal tendons overall therefore I believe patient would benefit from conservative care for now.  We discussed with the patient and she is agrees with the plan.  Will manage with ankle bracing Tri-Lock ankle brace was dispensed

## 2023-09-10 ENCOUNTER — Other Ambulatory Visit: Payer: Self-pay | Admitting: Radiology

## 2023-09-10 DIAGNOSIS — I871 Compression of vein: Secondary | ICD-10-CM

## 2023-09-14 ENCOUNTER — Other Ambulatory Visit: Payer: Self-pay | Admitting: Student

## 2023-09-15 ENCOUNTER — Encounter (HOSPITAL_COMMUNITY): Payer: Self-pay

## 2023-09-15 ENCOUNTER — Other Ambulatory Visit (HOSPITAL_COMMUNITY): Payer: Self-pay | Admitting: Interventional Radiology

## 2023-09-15 ENCOUNTER — Other Ambulatory Visit: Payer: Self-pay | Admitting: Interventional Radiology

## 2023-09-15 ENCOUNTER — Other Ambulatory Visit: Payer: Self-pay

## 2023-09-15 ENCOUNTER — Ambulatory Visit (HOSPITAL_COMMUNITY)
Admission: RE | Admit: 2023-09-15 | Discharge: 2023-09-15 | Disposition: A | Discharge status: Home / Self Care | Payer: PPO | Source: Ambulatory Visit | Attending: Interventional Radiology | Admitting: Interventional Radiology

## 2023-09-15 DIAGNOSIS — Z7902 Long term (current) use of antithrombotics/antiplatelets: Secondary | ICD-10-CM | POA: Insufficient documentation

## 2023-09-15 DIAGNOSIS — Z95828 Presence of other vascular implants and grafts: Secondary | ICD-10-CM

## 2023-09-15 DIAGNOSIS — Z86718 Personal history of other venous thrombosis and embolism: Secondary | ICD-10-CM | POA: Diagnosis not present

## 2023-09-15 DIAGNOSIS — I871 Compression of vein: Secondary | ICD-10-CM | POA: Diagnosis not present

## 2023-09-15 DIAGNOSIS — I82890 Acute embolism and thrombosis of other specified veins: Secondary | ICD-10-CM | POA: Diagnosis not present

## 2023-09-15 DIAGNOSIS — Z86711 Personal history of pulmonary embolism: Secondary | ICD-10-CM | POA: Diagnosis not present

## 2023-09-15 DIAGNOSIS — Z7901 Long term (current) use of anticoagulants: Secondary | ICD-10-CM | POA: Diagnosis not present

## 2023-09-15 DIAGNOSIS — Z4589 Encounter for adjustment and management of other implanted devices: Secondary | ICD-10-CM | POA: Diagnosis present

## 2023-09-15 HISTORY — PX: IR VENO/EXT/UNI LEFT: IMG675

## 2023-09-15 HISTORY — PX: IR VENOCAVAGRAM IVC: IMG678

## 2023-09-15 HISTORY — PX: IR IVUS EACH ADDITIONAL NON CORONARY VESSEL: IMG6086

## 2023-09-15 HISTORY — PX: IR US GUIDE VASC ACCESS RIGHT: IMG2390

## 2023-09-15 LAB — BASIC METABOLIC PANEL
Anion gap: 7 (ref 5–15)
BUN: 9 mg/dL (ref 8–23)
CO2: 19 mmol/L — ABNORMAL LOW (ref 22–32)
Calcium: 9.8 mg/dL (ref 8.9–10.3)
Chloride: 112 mmol/L — ABNORMAL HIGH (ref 98–111)
Creatinine, Ser: 0.69 mg/dL (ref 0.44–1.00)
GFR, Estimated: >60 mL/min (ref >60)
Glucose, Bld: 110 mg/dL — ABNORMAL HIGH (ref 70–99)
Potassium: 3.6 mmol/L (ref 3.5–5.1)
Sodium: 138 mmol/L (ref 135–145)

## 2023-09-15 MED ORDER — MIDAZOLAM HCL 2 MG/2ML IJ SOLN
INTRAMUSCULAR | Status: AC
  Filled 2023-09-15: qty 2

## 2023-09-15 MED ORDER — IOHEXOL 300 MG/ML  SOLN: 100.0000 mL | Freq: Once | INTRAMUSCULAR | Status: DC | PRN

## 2023-09-15 MED ORDER — FENTANYL CITRATE (PF) 100 MCG/2ML IJ SOLN
INTRAMUSCULAR | Status: AC
  Filled 2023-09-15: qty 2

## 2023-09-15 MED ORDER — MIDAZOLAM HCL 2 MG/2ML IJ SOLN
INTRAMUSCULAR | Status: AC | PRN
  Administered 2023-09-15: .5 mg via INTRAVENOUS
  Administered 2023-09-15: 1 mg via INTRAVENOUS

## 2023-09-15 MED ORDER — APIXABAN 5 MG PO TABS: 5.0000 mg | ORAL_TABLET | Freq: Two times a day (BID) | ORAL | 6 refills | Status: DC

## 2023-09-15 MED ORDER — LIDOCAINE-EPINEPHRINE 1 %-1:100000 IJ SOLN
INTRAMUSCULAR | Status: AC
  Filled 2023-09-15: qty 1

## 2023-09-15 MED ORDER — SODIUM CHLORIDE 0.9% FLUSH: 10.0000 mL | Freq: Two times a day (BID) | INTRAVENOUS | Status: DC

## 2023-09-15 MED ORDER — FENTANYL CITRATE (PF) 100 MCG/2ML IJ SOLN
INTRAMUSCULAR | Status: AC | PRN
  Administered 2023-09-15: 25 ug via INTRAVENOUS
  Administered 2023-09-15: 50 ug via INTRAVENOUS
  Administered 2023-09-15: 25 ug via INTRAVENOUS

## 2023-09-15 MED ORDER — LIDOCAINE-EPINEPHRINE 1 %-1:100000 IJ SOLN: 20.0000 mL | Freq: Once | INTRAMUSCULAR | Status: DC

## 2023-09-15 NOTE — H&P (Signed)
Chief Complaint: Patient was seen in consultation today for Inferior vena cava filter retrieval    Supervising Physician: Marliss Coots  Patient Status: [facility] - Out-pt  History of Present Illness: Marylen Kehs is a 67 y.o. 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 She underwent IVC filter placement with thrombectomy/angioplasty of left iliac and femoral veins and left iliac vein stent placement on 05/19/23.  Unfortunately she experienced thrombosis of the central and peripheral aspects of the left iliac vein stent with non-occlusive thrombus throughout the left popliteal through common femoral veins. She underwent left lower extremity venogram with left iliofemoral aspiration thrombectomy, balloon angioplasty and stent placement 07/16/23. She tolerated the procedure well and was discharged home the same day. Post-procedure she was started on 1 mg/kg lovenox BID and Plavix 75 mg daily. 1 month follow up CTV demonstrates patency of the stents with slight interval growth of focal IVC thrombus adherent to indwelling IVC filter.  She remains asymptomatic in the left lower extremity. -transition back to eliquis 5 mg BID, discontinue lovenox -continue plavix -plan for IVC filter retrieval at [facility], moderate sedation   Scheduled today for IVC filter retrieval Taking Eliquis and Plavix daily   Past Surgical History:  Procedure Laterality Date   COLON SURGERY  2016   fistula, small section removed   COLONOSCOPY  2015   Plus others   IR INTRAVASCULAR ULTRASOUND NON CORONARY  05/19/2023   IR INTRAVASCULAR ULTRASOUND NON CORONARY  07/16/2023   IR IVC FILTER PLMT / S&I /IMG GUID/MOD SED  05/19/2023   IR PTA VENOUS EXCEPT DIALYSIS CIRCUIT  05/19/2023   IR PTA VENOUS EXCEPT DIALYSIS CIRCUIT  07/16/2023   IR RADIOLOGIST EVAL &  MGMT  07/01/2023   IR RADIOLOGIST EVAL & MGMT  08/21/2023   IR THROMBECT VENO MECH MOD SED  05/19/2023   IR THROMBECT VENO MECH MOD SED  07/16/2023   IR TRANSCATH PLC STENT 1ST ART NOT LE CV CAR VERT CAR  05/19/2023   IR TRANSCATH PLC STENT 1ST ART NOT LE CV CAR VERT CAR  07/16/2023   IR US GUIDE VASC ACCESS LEFT  05/19/2023   IR US GUIDE VASC ACCESS LEFT  07/16/2023   IR US GUIDE VASC ACCESS RIGHT  05/19/2023   IR VENO/EXT/UNI LEFT  05/19/2023   IR VENO/EXT/UNI LEFT  07/16/2023   IR VENOCAVAGRAM IVC  05/19/2023   IR VENOCAVAGRAM IVC  07/16/2023   LAPAROSCOPIC LEFT COLON RESECTION  2015   Colovesical fistula, [place]   Left peroneal tendon repair Left 2023   WISDOM TOOTH EXTRACTION      Allergies: Aspirin, Ibuprofen, Other, and Propofol  Medications: Prior to Admission medications   Medication Sig Start Date End Date Taking? Authorizing Provider  apixaban (ELIQUIS) 5 MG TABS tablet Take 1 tablet (5 mg total) by mouth 2 (two) times daily. 08/21/23 03/18/24 Yes Mickie Kay, NP  calcium-vitamin D (OSCAL WITH D) 500-5 MG-MCG tablet Take 1 tablet by mouth daily with breakfast. 07/06/23  Yes [name], NP  Cholecalciferol (VITAMIN D3) 50 MCG (2000 UT) capsule Take 2,000 Units by mouth daily.   Yes [provider]  clopidogrel (PLAVIX) 75 MG tablet Take 1 tablet (75 mg total) by mouth daily. 07/16/23 07/10/24 Yes Covington, Arman Filter, NP  losartan (COZAAR) 25 MG tablet TAKE 1 TABLET BY MOUTH EVERY  DAY FOR BLOOD PRESSURE 01/21/23  Yes [name], NP  omeprazole (PRILOSEC) 20 MG capsule Take 20 mg by mouth daily.   Yes [provider]  Blood Glucose Monitoring Suppl DEVI 1 each by Does not apply route in the morning, at noon, and at bedtime. May substitute to any manufacturer covered by patient's insurance. 07/27/23   [name], NP  clobetasol (TEMOVATE) 0.05 % GEL APPLY TO AFFECTED AREAS OF THE MOUTH TWICE DAILY WHILE FLARING. 06/18/23   [provider]  clotrimazole (MYCELEX) 10 MG troche Take by mouth. 06/18/23   [provider]  enoxaparin (LOVENOX) 100 MG/ML injection Inject 0.8 mLs (80 mg total) into the skin every 12 (twelve) hours for 15 days. 08/07/23 08/22/23  Mickie Kay, NP  glucose blood test strip Use to check blood sugar up to three times daily. 08/04/23   [name], NP  Lancets Misc. MISC 1 each by Does not apply route in the morning, at noon, and at bedtime. May substitute to any manufacturer covered by patient's insurance. 07/27/23   [name], NP     Family History  Problem Relation Age of Onset   Heart disease Mother    Diabetes Mother    Hearing loss Mother    Heart disease Father    Prostate cancer Father    Alcohol abuse Father    Cancer Father    COPD Father    Malignant hyperthermia Sister    Pancreatic cancer Maternal Aunt    Diabetes Brother    Cancer Maternal Grandmother    Miscarriages / Stillbirths Maternal Grandmother    Diabetes Brother    Intellectual disability Brother    Learning disabilities Brother    Vision loss Brother    Miscarriages / Stillbirths Sister    Miscarriages / [place] Sister    Colon cancer Neg Hx    Esophageal cancer Neg Hx    Stomach cancer Neg Hx    Liver disease Neg Hx    Breast cancer Neg Hx     Social History   Socioeconomic History   Marital status: Married    Spouse name: Not on file   Number of children: Not on file   Years of education: Not on file   Highest education level: Associate degree: academic program  Occupational History   Not on file  Tobacco Use   Smoking status: Never   Smokeless tobacco: Never   Tobacco comments:    Never smoked  Vaping Use   Vaping status: Never Used  Substance and Sexual Activity   Alcohol use: Yes    Alcohol/week: 5.0 standard drinks of alcohol    Types: 5 Glasses of wine per week   Drug use: Never   Sexual activity: Yes    Birth control/protection: Post-menopausal   Other Topics Concern   Not on file  Social History Narrative   Married.  No children.   Moved from [place] area [place] in [place]    Worked in [employer], Insurance account manager.  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   Enjoys wine tasting.    1-2 caffeinated drinks a day 0-1 alcoholic never smoker no drug use   Sister Mardene Celeste works Adult nurse endoscopy as Charity fundraiser   Social Determinants of Health   Financial Resource Strain: Low Risk  (08/14/2023)   Overall Financial Resource Strain (CARDIA)    Difficulty of Paying  Living Expenses: Not hard at all  Food Insecurity: No Food Insecurity (08/14/2023)   Hunger Vital Sign    Worried About Running Out of Food in the Last Year: Never true    Ran Out of Food in the Last Year: Never true  Transportation Needs: No Transportation Needs (08/14/2023)   PRAPARE - Administrator, Civil Service (Medical): No    Lack of Transportation (Non-Medical): No  Physical Activity: Unknown (08/14/2023)   Exercise Vital Sign    Days of Exercise per Week: 0 days    Minutes of Exercise per Session: Not on file  Stress: No Stress Concern Present (08/14/2023)   [employer] of Occupational Health - Occupational Stress Questionnaire    Feeling of Stress : Not at all  Social Connections: Socially Integrated (08/14/2023)   Social Connection and Isolation Panel [NHANES]    Frequency of Communication with Friends and Family: More than three times a week    Frequency of Social Gatherings with Friends and Family: More than three times a week    Attends Religious Services: More than 4 times per year    Active Member of [employer] or Organizations: Yes    Attends Engineer, structural: More than 4 times per year    Marital Status: Married    Review of Systems: A 12 point ROS discussed and pertinent positives are indicated in the HPI above.  All other systems are negative.  Review  of Systems  Constitutional:  Negative for activity change, fatigue and fever.  Respiratory:  Negative for cough and shortness of breath.   Cardiovascular:  Negative for chest pain.  Gastrointestinal:  Negative for abdominal pain.  Musculoskeletal:  Negative for back pain.  Neurological:  Negative for weakness.  Psychiatric/Behavioral:  Negative for behavioral problems and confusion.     Vital Signs: BP (!) 167/86   Pulse 66   Temp 98 F (36.7 C) (Oral)   Resp 18   Ht 5\' 8"  (1.727 m)   Wt 175 lb (79.4 kg)   SpO2 96%   BMI 26.61 kg/m   Advance Care Plan: The advanced care plan/surrogate decision maker was discussed at the time of visit and documented in the medical record.    Physical Exam Vitals reviewed.  HENT:     Mouth/Throat:     Mouth: Mucous membranes are moist.  Cardiovascular:     Rate and Rhythm: Normal rate and regular rhythm.     Heart sounds: Normal heart sounds.  Pulmonary:     Effort: Pulmonary effort is normal.     Breath sounds: Normal breath sounds. No wheezing.  Abdominal:     Palpations: Abdomen is soft.     Tenderness: There is no abdominal tenderness.  Musculoskeletal:        General: Normal range of motion.  Skin:    General: Skin is warm.  Neurological:     Mental Status: She is alert and oriented to person, place, and time.  Psychiatric:        Behavior: Behavior normal.     Imaging: MR ANKLE LEFT WO CONTRAST  Result Date: 09/08/2023 CLINICAL DATA:  Left ankle pain over the last 3 months, prior peroneal tendon repair. EXAM: MRI OF THE LEFT ANKLE WITHOUT CONTRAST TECHNIQUE: Multiplanar, multisequence MR imaging of the ankle was performed. No intravenous contrast was administered. COMPARISON:  01/01/2022 FINDINGS: TENDONS Peroneal: Very poorly visualized segment of the peroneus brevis below the lateral malleolus suspicious for tear. Diminutive residual tendon extending  to the base of the fifth metatarsal best appreciated on the coronal images.  Small focus of metal artifact superficial to the peroneal tendons below the lateral malleolus possibly related to prior repair. Overlying vitamin-E capsule marker noted. Posteromedial: Mild distal tibialis posterior tendinopathy. Anterior: Unremarkable Achilles: Unremarkable Plantar Fascia: Unremarkable LIGAMENTS Lateral: Unremarkable Medial: Unremarkable CARTILAGE Ankle Joint: Unremarkable Subtalar Joints/Sinus Tarsi: Mild degenerative chondral thinning. Bones: Unremarkable Other: Mild subcutaneous edema overlying the mediolateral malleoli. IMPRESSION: 1. Very poorly visualized segment of the peroneus brevis below the lateral malleolus suspicious for tear. Diminutive residual tendon extending to the base of the fifth metatarsal. 2. Mild distal tibialis posterior tendinopathy. 3. Mild degenerative chondral thinning in the subtalar joints. 4. Mild subcutaneous edema overlying the mediolateral malleoli. Electronically Signed   By: Gaylyn Rong M.D.   On: 09/08/2023 15:58   US Abdomen Limited  Result Date: 08/27/2023 : PROCEDURE: ULTRASOUND ABDOMEN LIMITED HISTORY: Patient is a 67 y/o  F with subcutaneous masses. COMPARISON: . TECHNIQUE: Two-dimensional grayscale and color Doppler ultrasound of the of the bilateral lower abdomen region of interest was performed. FINDINGS: Four complex, avascular lesions are identified within the superficial tissues of the bilateral lower abdomen, corresponding to the areas of concern. These measure 2.1 cm, 1.7 cm, 1.6 cm and 1.5 cm, respectively. No focal fluid collections are demonstrated. IMPRESSION: 1. Multiple bilateral lower abdomen superficial avascular complex lesions. These may represent hematoma or complex lipoma however, etiology is indeterminate. Tissue sampling and/or follow-up CT with contrast may be of benefit. Thank you for allowing [place] to assist in the care of this patient. Electronically Signed   By: Lestine Box M.D.   On: 08/27/2023 11:35   IR Radiologist  Eval & Mgmt  Result Date: 08/21/2023 EXAM: ESTABLISHED PATIENT OFFICE VISIT CHIEF COMPLAINT: See Epic note. HISTORY OF PRESENT ILLNESS: See Epic note. REVIEW OF SYSTEMS: See Epic note. PHYSICAL EXAMINATION: See Epic note. ASSESSMENT AND PLAN: See Epic note. Marliss Coots, MD [facility] Electronically Signed   By: Marliss Coots M.D.   On: 08/21/2023 09:43    Labs:  CBC: Recent Labs    05/19/23 0119 05/20/23 0101 05/21/23 0643 07/16/23 0927  WBC 6.6 7.1 6.1 6.2  HGB 10.0* 9.0* 8.7* 12.7  HCT 30.0* 26.7* 25.5* 38.2  PLT 285 297 265 190    COAGS: Recent Labs    05/14/23 1930 05/15/23 0146 07/16/23 0927  INR 1.1 1.1 1.1  APTT 32 65*  --     BMP: Recent Labs    05/20/23 1245 05/21/23 0645 05/22/23 1104 07/16/23 0927  NA 139 138 136 142  K 3.4* 2.8* 3.4* 3.5  CL 107 110 107 112*  CO2 20* 20* 19* 20*  GLUCOSE 211* 112* 210* 94  BUN 7* 7* 11 9  CALCIUM 9.5 9.1 9.4 10.3  CREATININE 0.66 0.51 0.59 0.71  GFRNONAA >60 >60 >60 >60    LIVER FUNCTION TESTS: Recent Labs    01/20/23 0830 05/14/23 1749 05/15/23 0146 05/18/23 1129 05/20/23 1245 05/21/23 0645 05/22/23 1104  BILITOT 0.6 1.5* 1.3*  --   --   --   --   AST 22 26 21   --   --   --   --   ALT 42* 42 39  --   --   --   --   ALKPHOS 80 74 74  --   --   --   --   PROT 6.9 6.5 5.9*  --   --   --   --  ALBUMIN 4.3 3.7 3.0* 2.6* 2.6* 2.4* 2.6*    TUMOR MARKERS: No results for input(s): "AFPTM", "CEA", "CA199", "CHROMGRNA" in the last 8760 hours.  Assessment and Plan:  Scheduled for Inferior vena cava filter retrieval Pt is aware of procedure risks and benefits including but not limited to Infection; bleeding; vessel damage Agreeable to proceed Consent signed and in chart  Thank you for this interesting consult.  I greatly enjoyed meeting Hermia Clohessy and look forward to participating in their care.  A copy of this report was sent to the  requesting provider on this date.  Electronically Signed: Robet Leu, PA-C 09/15/2023, 7:30 AM   I spent a total of    25 Minutes in face to face in clinical consultation, greater than 50% of which was counseling/coordinating care for IVC filter retrieval

## 2023-09-15 NOTE — Procedures (Signed)
Interventional Radiology Procedure Note  Procedure:  1) Inferior venacavogram 2) Intravascular ultrasound  Findings: Please refer to procedural dictation for full description. 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  Persistent small thrombus adherent to superior aspect of indwelling IVC filter.  Patent IVC.  No filter retrieval performed.  Complications: None immediate  Estimated Blood Loss: < 5 ml  Recommendations: IR will arrange for elective iliac stent revision, possible filter retrieval with anesthesia at a later date.  Continue eliquis and plavix.  Encourage left leg compression stocking use.   Marliss Coots, MD

## 2023-09-15 NOTE — Discharge Instructions (Signed)
Activity Rest as told by your health care provider. Return to your normal activities as told by your health care provider. Ask your health care provider what activities are safe for you. May shower and remove bandage 24 hours after discharge. If you were given a sedative during the procedure, it can affect you for several hours. Do not drive or operate machinery until your health care provider says that it is safe. General instructions      Check your site every day for signs of infection. Check for: Redness, swelling, or pain. Fluid or blood. Warmth. Pus or a bad smell. Take over-the-counter and prescription medicines only as told by your health care provider. Contact a health care provider if: You have redness, swelling, warmth, pus, or pain around the insertion site.

## 2023-09-16 ENCOUNTER — Telehealth (HOSPITAL_COMMUNITY): Payer: Self-pay | Admitting: Radiology

## 2023-09-16 ENCOUNTER — Encounter (HOSPITAL_COMMUNITY): Payer: Self-pay | Admitting: Interventional Radiology

## 2023-09-16 ENCOUNTER — Other Ambulatory Visit (HOSPITAL_COMMUNITY): Payer: Self-pay | Admitting: Interventional Radiology

## 2023-09-16 DIAGNOSIS — I871 Compression of vein: Secondary | ICD-10-CM

## 2023-09-16 NOTE — Telephone Encounter (Signed)
Called pt, left VM for her to call me to schedule her LLE venous thrombectomy with Dr. Elby Showers on 10/22/23. JM

## 2023-09-22 ENCOUNTER — Ambulatory Visit
Admission: RE | Admit: 2023-09-22 | Discharge: 2023-09-22 | Disposition: A | Discharge status: Home / Self Care | Payer: PPO | Source: Ambulatory Visit | Attending: Interventional Radiology | Admitting: Interventional Radiology

## 2023-09-22 DIAGNOSIS — Z95828 Presence of other vascular implants and grafts: Secondary | ICD-10-CM

## 2023-09-23 ENCOUNTER — Encounter (HOSPITAL_COMMUNITY): Payer: Self-pay | Admitting: Radiology

## 2023-10-01 ENCOUNTER — Telehealth: Payer: Self-pay

## 2023-10-01 DIAGNOSIS — S86312S Strain of muscle(s) and tendon(s) of peroneal muscle group at lower leg level, left leg, sequela: Secondary | ICD-10-CM

## 2023-10-01 NOTE — Telephone Encounter (Signed)
Unable to reach patient. Left voicemail to return call to our office.   

## 2023-10-01 NOTE — Telephone Encounter (Signed)
Copied from CRM [id number]. Topic: Referral - Request for Referral >> Oct 01, 2023 11:24 AM Tiffany H wrote: Did the patient discuss referral with their provider in the last year? Yes (If No - schedule appointment) (If Yes - send message)  Appointment offered? Yes  Type of order/referral and detailed reason for visit: Patient would like a second opinion on ortho referral. Patient has new tendon tears in ankle and will need another surgery. Please assist with new provider.   Preference of office, provider, location:   If referral order, have you been seen by this specialty before? Yes (If Yes, this issue or another issue? When? Where?  Can we respond through MyChart? Yes

## 2023-10-01 NOTE — Addendum Note (Signed)
Addended by: [name] on: 10/01/2023 04:33 PM   Modules accepted: Orders

## 2023-10-01 NOTE — Telephone Encounter (Signed)
Copied from CRM [id number]. Topic: Referral - Question >> Oct 01, 2023  4:10 PM Sim Boast F wrote: Reason for CRM: Patient returning [facility] phone call - pt says she is open to seeing an Orthopedic but it really depends on what her insurance will cover.

## 2023-10-01 NOTE — Telephone Encounter (Signed)
Please call patient:  It looks like she follows with podiatry now. Does she want to see orthopedics instead? Or another podiatrist?

## 2023-10-01 NOTE — Telephone Encounter (Signed)
Noted, referral placed to orthopedics.

## 2023-10-02 NOTE — Telephone Encounter (Signed)
See other phone encounter.  

## 2023-10-04 NOTE — Progress Notes (Unsigned)
° ° °  Cayman Kielbasa T. Anthone Prieur, MD, CAQ Sports Medicine [facility] at [facility] [address] [place], [address]  Phone: [phone]  FAX: [phone]  Gelisa Gironda - 67 y.o. female  MRN [id number]  Date of Birth: 04/25/1956  Date: 10/05/2023  PCP: [name], NP  Referral: [name], NP  No chief complaint on file.  Subjective:   Evy Servin is a 67 y.o. very pleasant female patient with There is no height or weight on file to calculate BMI. who presents with the following:  The patient is a new patient to me, and she presents with some ongoing leg swelling.  She does have a history of in August 2024 bilateral pulmonary emboli with significant left-sided lower extremity DVT.  Workup for hypercoagulability by hematology was normal.  Plan was to continue Eliquis 5 mg twice daily.  They had planned to have a repeat chest CT D-dimer, cardiolipin antibody in February 2025.  She had an IVC filter placed with thrombectomy and angioplasty of the left iliac and femoral veins, left iliac vein stent placement in August 2024.  She underwent venogram and aspiration thrombectomy of the left lower extremity and indwelling stent on July 16, 2023 at interventional radiology.  She has an upcoming left lower extremity venous thrombectomy on October 22, 2023.    Review of Systems is noted in the HPI, as appropriate  Objective:   There were no vitals taken for this visit.  GEN: No acute distress; alert,appropriate. PULM: Breathing comfortably in no respiratory distress PSYCH: Normally interactive.   Laboratory and Imaging Data:  Assessment and Plan:   ***

## 2023-10-05 ENCOUNTER — Encounter: Payer: PPO | Admitting: Primary Care

## 2023-10-05 ENCOUNTER — Ambulatory Visit (INDEPENDENT_AMBULATORY_CARE_PROVIDER_SITE_OTHER): Payer: PPO | Admitting: Family Medicine

## 2023-10-05 ENCOUNTER — Encounter: Payer: Self-pay | Admitting: Family Medicine

## 2023-10-05 VITALS — BP 124/80 | HR 61 | Temp 97.8°F | Ht 68.0 in | Wt 187.1 lb

## 2023-10-05 DIAGNOSIS — R2242 Localized swelling, mass and lump, left lower limb: Secondary | ICD-10-CM

## 2023-10-05 DIAGNOSIS — G8929 Other chronic pain: Secondary | ICD-10-CM

## 2023-10-05 DIAGNOSIS — I2609 Other pulmonary embolism with acute cor pulmonale: Secondary | ICD-10-CM

## 2023-10-05 DIAGNOSIS — S86312S Strain of muscle(s) and tendon(s) of peroneal muscle group at lower leg level, left leg, sequela: Secondary | ICD-10-CM

## 2023-10-05 DIAGNOSIS — I82433 Acute embolism and thrombosis of popliteal vein, bilateral: Secondary | ICD-10-CM | POA: Diagnosis not present

## 2023-10-05 DIAGNOSIS — M25572 Pain in left ankle and joints of left foot: Secondary | ICD-10-CM

## 2023-10-05 NOTE — Patient Instructions (Signed)
  Towel "Scrunch Ups" Use a hand towel or a moderate size towel Foot flat down on the towel Use toes to "scrunch up the towel" straight up and down, and going to the right and left.  3 sets of 20 * Can be done watching TV, reading, or sitting and relaxing.

## 2023-10-06 ENCOUNTER — Encounter: Payer: Self-pay | Admitting: *Deleted

## 2023-10-06 ENCOUNTER — Ambulatory Visit
Admission: RE | Admit: 2023-10-06 | Discharge: 2023-10-06 | Disposition: A | Discharge status: Home / Self Care | Payer: PPO | Source: Ambulatory Visit | Attending: Family Medicine | Admitting: Family Medicine

## 2023-10-06 DIAGNOSIS — I82433 Acute embolism and thrombosis of popliteal vein, bilateral: Secondary | ICD-10-CM

## 2023-10-06 DIAGNOSIS — R2242 Localized swelling, mass and lump, left lower limb: Secondary | ICD-10-CM

## 2023-10-14 ENCOUNTER — Other Ambulatory Visit (HOSPITAL_COMMUNITY): Payer: Self-pay | Admitting: Interventional Radiology

## 2023-10-14 DIAGNOSIS — I871 Compression of vein: Secondary | ICD-10-CM

## 2023-10-21 ENCOUNTER — Other Ambulatory Visit: Payer: Self-pay

## 2023-10-21 ENCOUNTER — Encounter (HOSPITAL_COMMUNITY): Payer: Self-pay | Admitting: Interventional Radiology

## 2023-10-21 ENCOUNTER — Other Ambulatory Visit: Payer: Self-pay | Admitting: Radiology

## 2023-10-21 DIAGNOSIS — I8290 Acute embolism and thrombosis of unspecified vein: Secondary | ICD-10-CM

## 2023-10-21 NOTE — H&P (Deleted)
The note originally documented on this encounter has been moved the the encounter in which it belongs.

## 2023-10-21 NOTE — Progress Notes (Addendum)
 PCP - Gabriel John, NP  Cardiologist - Nahser, Lela Purple, MD   PPM/ICD - denies Device Orders -  Rep Notified -  Chest x-ray -  EKG - 05-14-23 Stress Test -  ECHO - 05-15-23 Cardiac Cath -   CPAP - denies   DM denies  Blood Thinner Instructions: apixaban  (ELIQUIS ) clopidogrel  (PLAVIX ) patient is to continue Aspirin Instructions:   ERAS Protcol - NPO per patient  COVID TEST- n/a  Anesthesia review: yes, sister had MALIGNANT HYPERTHERMIA in 2015 no other problems with her. Patient denies any problem with anesthesia. HX of DVT, HTN  Patient verbally denies any shortness of breath, fever, cough and chest pain during phone call   -------------  SDW INSTRUCTIONS given:  Your procedure is scheduled on January 16,2025.  Report to [facility] Main Entrance "A" at 7:30 A.M., and check in at the Admitting office.  Call this number if you have problems the morning of surgery:  [phone]   Remember:  Do not eat after midnight the night before your surgery  You may drink clear liquids until 7:00 the morning of your surgery.   Clear liquids allowed are: Water, Non-Citrus Juices (without pulp), Carbonated Beverages, Clear Tea, Black Coffee Only, and Gatorade    Take these medicines the morning of surgery with A SIP OF WATER  omeprazole (PRILOSEC)   STOP taking any Aspirin (unless otherwise instructed by your surgeon) Aleve, Naproxen, Ibuprofen, Motrin, Advil, Goody's, BC's, all herbal medications, fish oil, and all vitamins.                      Do not wear jewelry, make up, or nail polish            Do not wear lotions, powders, perfumes/colognes, or deodorant.            Do not shave 48 hours prior to surgery.  Men may shave face and neck.            Do not bring valuables to the hospital.            [facility] is not responsible for any belongings or valuables.  Do NOT Smoke (Tobacco/Vaping) 24 hours prior to your procedure If you use a CPAP at night, you may bring all  equipment for your overnight stay.   Contacts, glasses, dentures or bridgework may not be worn into surgery.      For patients admitted to the hospital, discharge time will be determined by your treatment team.   Patients discharged the day of surgery will not be allowed to drive home, and someone needs to stay with them for 24 hours.    Special instructions:   [facility]- Preparing For Surgery  Before surgery, you can play an important role. Because skin is not sterile, your skin needs to be as free of germs as possible. You can reduce the number of germs on your skin by washing with CHG (chlorahexidine gluconate) Soap before surgery.  CHG is an antiseptic cleaner which kills germs and bonds with the skin to continue killing germs even after washing.    Oral Hygiene is also important to reduce your risk of infection.  Remember - BRUSH YOUR TEETH THE MORNING OF SURGERY WITH YOUR REGULAR TOOTHPASTE  Please do not use if you have an allergy to CHG or antibacterial soaps. If your skin becomes reddened/irritated stop using the CHG.  Do not shave (including legs and underarms) for at least 48 hours  prior to first CHG shower. It is OK to shave your face.  Please follow these instructions carefully.   Shower the NIGHT BEFORE SURGERY and the MORNING OF SURGERY with DIAL Soap.   Pat yourself dry with a CLEAN TOWEL.  Wear CLEAN PAJAMAS to bed the night before surgery  Place CLEAN SHEETS on your bed the night of your first shower and DO NOT SLEEP WITH PETS.   Day of Surgery: Please shower morning of surgery  Wear Clean/Comfortable clothing the morning of surgery Do not apply any deodorants/lotions.   Remember to brush your teeth WITH YOUR REGULAR TOOTHPASTE.   Questions were answered. Patient verbalized understanding of instructions.

## 2023-10-21 NOTE — Anesthesia Preprocedure Evaluation (Addendum)
Anesthesia Evaluation  Patient identified by MRN, date of birth, ID band Patient awake    Reviewed: Allergy & Precautions, NPO status , Patient's Chart, lab work & pertinent test results  History of Anesthesia Complications (+) Family history of anesthesia reaction and history of anesthetic complications (sister w/ MH. 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 She underwent IVC filter placement with thrombectomy/angioplasty of left iliac and femoral veins and left iliac vein stent placement on 05/19/23.  She had a brief episode of atrial fibrillation during admission.  Unfortunately she experienced thrombosis of the central and peripheral aspects of the left iliac vein stent with non-occlusive thrombus throughout the left popliteal through common femoral veins. She underwent left lower extremity venogram with left iliofemoral aspiration thrombectomy, balloon angioplasty and stent placement 07/16/23. She tolerated the procedure well and was discharged home the same day.  Patient was scheduled for IVC filter retrieval on 09/15/2023.  However, per interventional radiology procedure note, "Findings: Please refer to procedural dictation for full description. 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  Persistent small thrombus adherent to superior aspect of indwelling IVC filter.  Patent IVC.  No filter retrieval performed."    Pulmonary exam normal breath sounds clear to auscultation       Cardiovascular hypertension (156/82 preop, took this AM- per pt normally 120s/80s), Pt. on medications pulmonary hypertension (mod pHTN on last echo)Normal cardiovascular  exam Rhythm:Regular Rate:Normal  TTE 05/15/2023: 1. Right ventricular systolic function is mildly reduced. The right  ventricular size is normal. There is moderately elevated pulmonary artery  systolic pressure. The estimated right ventricular systolic pressure is  49.5 mmHg.  2. Left ventricular ejection fraction, by estimation, is 60 to 65%. The  left ventricle has normal function. The left ventricle has no regional  wall motion abnormalities.  3. The mitral valve is grossly normal. Trivial mitral valve  regurgitation. No evidence of mitral stenosis.  4. The aortic valve is tricuspid. Aortic valve regurgitation is trivial.  No aortic stenosis is present.  5. The inferior vena cava is normal in size with greater than 50%  respiratory variability, suggesting right atrial pressure of 3 mmHg.   Comparison(s): Changes from prior study are noted. LV function unchanged.  RV function and size unchanged. RVSP elevated up to 49 mmHG on this study.     Coronary calcium scoring 04/20/2023: IMPRESSION: 1. Coronary calcium score of 0. This was 1st percentile for age-, race-, and sex-matched controls.  2. Mild aortic atherosclerosis.     Neuro/Psych  Headaches  negative psych ROS   GI/Hepatic Neg liver ROS,GERD  Medicated and Controlled,,  Endo/Other  diabetes, Well Controlled    Renal/GU negative Renal ROS  negative genitourinary   Musculoskeletal negative musculoskeletal ROS (+)    Abdominal   Peds  Hematology negative hematology ROS (+)   Anesthesia Other Findings  May-Thurner Syndrome  Reproductive/Obstetrics negative OB ROS                             Anesthesia Physical Anesthesia Plan  ASA: 3  Anesthesia Plan: General   Post-op Pain Management: Tylenol PO (pre-op)*   Induction: Intravenous  PONV Risk  Score and Plan: 3 and Ondansetron, Dexamethasone and Treatment may vary due to age or medical condition  Airway Management  Planned: Oral ETT  Additional Equipment:   Intra-op Plan:   Post-operative Plan: Extubation in OR  Informed Consent: I have reviewed the patients History and Physical, chart, labs and discussed the procedure including the risks, benefits and alternatives for the proposed anesthesia with the patient or authorized representative who has indicated his/her understanding and acceptance.     Dental advisory given  Plan Discussed with: CRNA  Anesthesia Plan Comments: (MH precautions   )        Anesthesia Quick Evaluation

## 2023-10-21 NOTE — H&P (Signed)
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 Berton Mount, Dr. Serena Croissant   Supervising Physician: Marliss Coots  Patient Status: Heather Hudson - Out-pt  History of Present Illness: Heather Hudson is a 68 y.o. female  with a medical history significant for HTN, colovesical fistula s/p repair and mucous membrane pemphigoid. She presented to the ED in early August with worsening dyspnea and fatigue that had started after she returned from a trip to [place] in late June. CTA chest showed bilateral pulmonary emboli involving the right main pulmonary artery and numerous lobar, segmental and subsegmental pulmonary arteries bilaterally. She was started in a heparin infusion while undergoing additional work up. A lower extremity ultrasound was positive for a left lower extremity DVT. She was managed conservatively for a few days until she developed worsening swelling of the left lower extremity from the toes to thigh. IR was consulted and our team requested a CT venogram abdomen/pelvis for further evaluation. This showed an acute appearing thrombus extending from the left common femoral vein to the junction of the left common iliac vein and IVC with concern for May-Thurner morphology.   On 05/19/23 Dr. Elby Showers performed a left lower extremity venogram, inferior venocavogram, IVC filter placement and left iliofemoral extremity aspiration thrombectomy with balloon angioplasty of left iliofemoral veins. He also placed a stent from the left common through left external iliac veins.  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 She was discharged from the hospital 05/22/23 on Eliquis.   A repeat CT Venogram 06/23/23 unfortunately showed she had experienced thrombosis of the central and  peripheral aspects of the left iliac vein stent and demonstrated non-occlusive thrombus throughout the left popliteal through common femoral veins. Thankfully, she remained asymptomatic and reported feeling much better. Given the extent of thrombus Dr. Elby Showers discussed treatment options including continuing anticoagulation alone versus additional intervention. He recommended repeat aspiration thrombectomy of the left lower extremity and indwelling stent to re-establish flow.  She was agreement with this plan the procedure was performed 07/16/23. She underwent left lower extremity venogram with left iliofemoral aspiration thrombectomy, balloon angioplasty and stent placement. She tolerated the procedure well and was discharged home the same day. Post-procedure she was started on 1 mg/kg lovenox BID and Plavix 75 mg daily.  She followed up with Dr. Elby Showers 08/21/23 and he discussed that her recent CT venogram showed patency of the stents with slight interval growth of focal IVC thrombus adherent to the indwelling IVC filter. They discussed IVC filter retrieval, discontinuing lovenox and transitioning back to Eliquis. She returned to the hospital 09/15/23 for retrieval of the IVC filter but was found to have worsening stent stenosis and the filter was left in place. She was advised to continue taking Eliquis and Plavix and Dr. Elby Showers discussed a repeat venogram and stent thrombectomy with general anesthesia. The patient was agreeable to this.   Past Medical History:  Diagnosis Date   Adenomatous colon polyp 2015   Allergy 1974   teenager, and 2008   Colovesical fistula 2015   COVID-19 06/2020   Environmental and seasonal allergies    Hx of adenomatous colonic polyps 01/19/2021   Hypertension 11/23   Low dose prescription   Lower abdominal pain 12/23/2019   May-Thurner syndrome    Migraines    Motion sickness    Persistent  cough for 3 weeks or longer 10/10/2021    Past Surgical History:  Procedure  Laterality Date   COLON SURGERY  2016   fistula, small section removed   COLONOSCOPY  2015   Plus others   IR INTRAVASCULAR ULTRASOUND NON CORONARY  05/19/2023   IR INTRAVASCULAR ULTRASOUND NON CORONARY  07/16/2023   IR IVC FILTER PLMT / S&I /IMG GUID/MOD SED  05/19/2023   IR IVUS EACH ADDITIONAL NON CORONARY VESSEL  09/15/2023   IR PTA VENOUS EXCEPT DIALYSIS CIRCUIT  05/19/2023   IR PTA VENOUS EXCEPT DIALYSIS CIRCUIT  07/16/2023   IR RADIOLOGIST EVAL & MGMT  07/01/2023   IR RADIOLOGIST EVAL & MGMT  08/21/2023   IR THROMBECT VENO MECH MOD SED  05/19/2023   IR THROMBECT VENO MECH MOD SED  07/16/2023   IR TRANSCATH PLC STENT 1ST ART NOT LE CV CAR VERT CAR  05/19/2023   IR TRANSCATH PLC STENT 1ST ART NOT LE CV CAR VERT CAR  07/16/2023   IR US GUIDE VASC ACCESS LEFT  05/19/2023   IR US GUIDE VASC ACCESS LEFT  07/16/2023   IR US GUIDE VASC ACCESS RIGHT  05/19/2023   IR US GUIDE VASC ACCESS RIGHT  09/15/2023   IR VENO/EXT/UNI LEFT  05/19/2023   IR VENO/EXT/UNI LEFT  07/16/2023   IR VENOCAVAGRAM IVC  05/19/2023   IR VENOCAVAGRAM IVC  07/16/2023   IR VENOCAVAGRAM IVC  09/15/2023   LAPAROSCOPIC LEFT COLON RESECTION  2015   Colovesical fistula, [place]   Left peroneal tendon repair Left 2023   WISDOM TOOTH EXTRACTION      Allergies: Aspirin, Ibuprofen, Other, and Propofol  Medications: Prior to Admission medications   Medication Sig Start Date End Date Taking? Authorizing Provider  apixaban (ELIQUIS) 5 MG TABS tablet Take 1 tablet (5 mg total) by mouth 2 (two) times daily. 09/15/23   Mickie Kay, NP  Blood Glucose Monitoring Suppl DEVI 1 each by Does not apply route in the morning, at noon, and at bedtime. May substitute to any manufacturer covered by patient's insurance. 07/27/23   [name], NP  Calcium Carb-Cholecalciferol 600-12.5 MG-MCG TABS Take 2 tablets by mouth daily.    [provider]  clobetasol (TEMOVATE) 0.05 % GEL Apply 1 Application topically 2 (two)  times daily as needed. 06/18/23   [provider]  clopidogrel (PLAVIX) 75 MG tablet Take 1 tablet (75 mg total) by mouth daily. 07/16/23 07/10/24  Mickie Kay, NP  folic acid (FOLVITE) 1 MG tablet Take 2 mg by mouth daily. 09/14/23   [provider]  glucose blood test strip Use to check blood sugar up to three times daily. 08/04/23   [name], NP  Lancets Misc. MISC 1 each by Does not apply route in the morning, at noon, and at bedtime. May substitute to any manufacturer covered by patient's insurance. 07/27/23   [name], NP  losartan (COZAAR) 25 MG tablet TAKE 1 TABLET BY MOUTH EVERY DAY FOR BLOOD PRESSURE 01/21/23   [name], NP  omeprazole (PRILOSEC) 20 MG capsule Take 20 mg by mouth daily.    [provider]     Family History  Problem Relation Age of Onset   Heart disease Mother    Diabetes Mother    Hearing loss Mother    Heart disease Father    Prostate cancer Father    Alcohol abuse Father    Cancer Father    COPD Father  Malignant hyperthermia Sister    Pancreatic cancer Maternal Aunt    Diabetes Brother    Cancer Maternal Grandmother    Miscarriages / Stillbirths Maternal Grandmother    Diabetes Brother    Intellectual disability Brother    Learning disabilities Brother    Vision loss Brother    Miscarriages / Stillbirths Sister    Miscarriages / [place] Sister    Colon cancer Neg Hx    Esophageal cancer Neg Hx    Stomach cancer Neg Hx    Liver disease Neg Hx    Breast cancer Neg Hx     Social History   Socioeconomic History   Marital status: Married    Spouse name: Not on file   Number of children: Not on file   Years of education: Not on file   Highest education level: Associate degree: occupational, Scientist, product/process development, or vocational program  Occupational History   Not on file  Tobacco Use   Smoking status: Never   Smokeless tobacco: Never   Tobacco comments:    Never smoked  Vaping Use    Vaping status: Never Used  Substance and Sexual Activity   Alcohol use: Yes    Alcohol/week: 5.0 standard drinks of alcohol    Types: 5 Glasses of wine per week   Drug use: Never   Sexual activity: Yes    Birth control/protection: Post-menopausal  Other Topics Concern   Not on file  Social History Narrative   Married.  No children.   Moved from [place] area [place] in 2018    Worked in [employer], Insurance account manager.  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   Enjoys wine tasting.    1-2 caffeinated drinks a day 0-1 alcoholic never smoker no drug use   Sister Mardene Celeste works Adult nurse endoscopy as Charity fundraiser   Social Drivers of Corporate investment banker Strain: Low Risk  (10/01/2023)   Overall Financial Resource Strain (CARDIA)    Difficulty of Paying Living Expenses: Not hard at all  Food Insecurity: No Food Insecurity (10/01/2023)   Hunger Vital Sign    Worried About Running Out of Food in the Last Year: Never true    Ran Out of Food in the Last Year: Never true  Transportation Needs: No Transportation Needs (10/01/2023)   PRAPARE - Administrator, Civil Service (Medical): No    Lack of Transportation (Non-Medical): No  Physical Activity: Unknown (10/01/2023)   Exercise Vital Sign    Days of Exercise per Week: 0 days    Minutes of Exercise per Session: Not on file  Stress: No Stress Concern Present (10/01/2023)   [employer] of Occupational Health - Occupational Stress Questionnaire    Feeling of Stress : Not at all  Social Connections: Socially Integrated (10/01/2023)   Social Connection and Isolation Panel [NHANES]    Frequency of Communication with Friends and Family: More than three times a week    Frequency of Social Gatherings with Friends and Family: More than three times a week    Attends Religious Services: More than 4 times per year    Active Member of [employer] or  Organizations: Yes    Attends Engineer, structural: More than 4 times per year    Marital Status: Married    Review of Systems: A 12 point ROS discussed and pertinent positives are indicated in the HPI above.  All other systems  are negative.  Review of Systems  Constitutional:  Negative for appetite change and fatigue.  Respiratory:  Negative for cough and shortness of breath.   Cardiovascular:  Positive for leg swelling. Negative for chest pain.  Gastrointestinal:  Negative for abdominal pain, diarrhea, nausea and vomiting.  Neurological:  Negative for dizziness and headaches.    Vital Signs: 98.2, 156/82, 68 HR, 18 RR, 98% on room air  Physical Exam Constitutional:      General: She is not in acute distress.    Appearance: She is not ill-appearing.  Cardiovascular:     Rate and Rhythm: Normal rate.  Pulmonary:     Effort: Pulmonary effort is normal.  Abdominal:     Tenderness: There is no abdominal tenderness.  Musculoskeletal:     Right lower leg: Edema present.     Left lower leg: Edema present.  Skin:    General: Skin is warm and dry.  Neurological:     Mental Status: She is alert and oriented to person, place, and time.  Psychiatric:        Mood and Affect: Mood normal.        Behavior: Behavior normal.        Thought Content: Thought content normal.        Judgment: Judgment normal.     Imaging: US Venous Img Lower Unilateral Left (DVT) Result Date: 10/06/2023 CLINICAL DATA:  Left lower extremity edema.  Evaluate for DVT. EXAM: LEFT LOWER EXTREMITY VENOUS DOPPLER ULTRASOUND TECHNIQUE: Gray-scale sonography with graded compression, as well as color Doppler and duplex ultrasound were performed to evaluate the lower extremity deep venous systems from the level of the common femoral vein and including the common femoral, femoral, profunda femoral, popliteal and calf veins including the posterior tibial, peroneal and gastrocnemius veins when visible. The  superficial great saphenous vein was also interrogated. Spectral Doppler was utilized to evaluate flow at rest and with distal augmentation maneuvers in the common femoral, femoral and popliteal veins. 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 FINDINGS: Contralateral Common Femoral Vein: Respiratory phasicity is normal and symmetric with the symptomatic side. No evidence of thrombus. Normal compressibility. Common Femoral Vein: No evidence of thrombus. Normal compressibility, respiratory phasicity and response to augmentation. Saphenofemoral Junction: No evidence of thrombus. Normal compressibility and flow on color Doppler imaging. Profunda Femoral Vein: No evidence of thrombus. Normal compressibility and flow on color Doppler imaging. Femoral Vein: Examination is positive for mixed echogenic near occlusive thrombus involving the proximal (image 13), mid (image 16) and distal (image 19) aspects of the left femoral vein, grossly unchanged compared to the 09/22/2023 examination. Popliteal Vein: Redemonstrated nonocclusive wall thickening involving the left popliteal vein (image 22 and 24), similar to the 09/22/2023 examination. Calf Veins: Appear patent where visualized. Superficial Great Saphenous Vein: No evidence of thrombus. Normal compressibility. Other Findings:  None. IMPRESSION: 1. No evidence of acute or progressive DVT within the left lower extremity. 2. 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 Electronically Signed   By: Simonne Come M.D.   On: 10/06/2023 14:02   US Venous Img Lower Bilateral (DVT) Result Date: 09/22/2023 CLINICAL DATA:  History of left lower extremity DVT secondary to May-Thurner syndrome, post mechanical  thrombectomy, venous stent placement as well as IVC filter placement. History of chronic DVT extending  from the left popliteal vein through the left common femoral vein. EXAM: BILATERAL LOWER EXTREMITY VENOUS DOPPLER ULTRASOUND TECHNIQUE: Gray-scale sonography with graded compression, as well as color Doppler and duplex ultrasound were performed to evaluate the lower extremity deep venous systems from the level of the common femoral vein and including the common femoral, femoral, profunda femoral, popliteal and calf veins including the posterior tibial, peroneal and gastrocnemius veins when visible. The superficial great saphenous vein was also interrogated. Spectral Doppler was utilized to evaluate flow at rest and with distal augmentation maneuvers in the common femoral, femoral and popliteal veins. COMPARISON:  Left lower extremity venous Doppler ultrasound-06/23/2023 (positive for chronic occlusive DVT extending from the left popliteal vein through the left common femoral vein) Intra procedural images during left lower extremity mechanical thrombectomy, balloon angioplasty and venous stenting-07/16/2023 FINDINGS: RIGHT LOWER EXTREMITY Common Femoral Vein: No evidence of thrombus. Normal compressibility, respiratory phasicity and response to augmentation. Saphenofemoral Junction: No evidence of thrombus. Normal compressibility and flow on color Doppler imaging. Profunda Femoral Vein: No evidence of thrombus. Normal compressibility and flow on color Doppler imaging. Femoral Vein: No evidence of thrombus. Normal compressibility, respiratory phasicity and response to augmentation. Popliteal Vein: No evidence of thrombus. Normal compressibility, respiratory phasicity and response to augmentation. Calf Veins: No evidence of thrombus. Normal compressibility and flow on color Doppler imaging. Superficial Great Saphenous Vein: No evidence of thrombus. Normal compressibility. Other Findings:  None. LEFT LOWER EXTREMITY  There is mixed echogenic near occlusive DVT involving the left common femoral vein (image 38) The saphenofemoral junction and proximal aspect of the greater saphenous vein appears patent (image 41), as does the imaged proximal aspect of the left deep femoral vein (image 44). There is chronic near occlusive DVT involving the proximal (image 47), mid (image 50) and distal (image 52) aspects of the left femoral vein, similar to the 06/23/2023 examination. No definitive evidence of acute or chronic DVT within the left popliteal vein (representative images 54 through 57), improved compared to the 06/23/2023 examination). The posterior tibial and peroneal veins appear patent where imaged. Other Findings:  None. IMPRESSION: 1. No evidence of acute or progressive DVT within the left lower extremity. 2. 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 Overall clot burden within the lower extremity is slightly improved compared to the 06/23/2023 examination 3. No evidence of acute or chronic DVT within the right lower extremity. Electronically Signed   By: Simonne Come M.D.   On: 09/22/2023 15:51    Labs:  CBC: Recent Labs    05/20/23 0101 05/21/23 0643 07/16/23 0927 10/22/23 0900  WBC 7.1 6.1 6.2 4.3  HGB 9.0* 8.7* 12.7 13.1  HCT 26.7* 25.5* 38.2 38.9  PLT 297 265 190 155    COAGS: Recent Labs    05/14/23 1930 05/15/23 0146 07/16/23 0927  INR 1.1 1.1 1.1  APTT 32 65*  --     BMP: Recent Labs    05/21/23 0645 05/22/23 1104 07/16/23 0927 09/15/23 0709  NA 138 136 142 138  K 2.8* 3.4* 3.5 3.6  CL 110 107 112* 112*  CO2 20* 19* 20* 19*  GLUCOSE 112* 210* 94 110*  BUN 7* 11 9 9   CALCIUM 9.1 9.4 10.3 9.8  CREATININE 0.51 0.59 0.71 0.69  GFRNONAA >60 >60 >60 >60    LIVER FUNCTION TESTS: Recent Labs    01/20/23 0830 05/14/23 1749 05/15/23 0146 05/18/23 1129 05/20/23 1245 05/21/23 0645 05/22/23 1104  BILITOT 0.6  1.5* 1.3*  --   --   --   --   AST 22 26 21   --   --   --   --   ALT 42* 42 39  --   --   --   --   ALKPHOS 80 74 74  --   --   --   --   PROT 6.9 6.5 5.9*  --   --   --   --   ALBUMIN 4.3 3.7 3.0* 2.6* 2.6* 2.4* 2.6*    TUMOR MARKERS: No results for input(s): "AFPTM", "CEA", "CA199", "CHROMGRNA" in the last 8760 hours.  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   Risks and benefits of this procedure were discussed with the patient including, but not limited to bleeding, infection, vascular injury or contrast induced renal failure.  This interventional procedure involves the use of X-rays and because of the nature of the planned procedure, it is possible that we will have prolonged use of X-ray fluoroscopy.  Potential radiation risks to you include (but are not limited to) the following: - A slightly elevated risk for cancer  several years later in life. This risk is typically less than 0.5% percent. This risk is low in comparison to the normal incidence of human cancer, which is 33% for women and 50% for men according to the American Cancer Society. - Radiation induced injury can include skin redness, resembling a rash, tissue breakdown / ulcers and hair loss (which can be temporary or permanent).   The likelihood of either of these occurring depends on the difficulty of the procedure and whether you are sensitive to radiation due to previous procedures, disease, or genetic conditions.   IF your procedure requires a prolonged use of radiation, you will be notified and given written instructions for further action.  It is your responsibility to monitor the irradiated area  for the 2 weeks following the procedure and to notify your physician if you are concerned that you have suffered a radiation induced injury.    All of the patient's questions were answered, patient is agreeable to proceed. She has been NPO.   Consent signed and in chart.  Thank you for this interesting consult.  I greatly enjoyed meeting Rhea Kaelin and look forward to participating in their care.  A copy of this report was sent to the requesting provider on this date.  Electronically Signed: Alwyn Ren, AGACNP-BC 10/22/2023, 9:22 AM   I spent a total of  30 Minutes   in face to face in clinical consultation, greater than 50% of which was counseling/coordinating care for May-Thurner Syndrome.

## 2023-10-21 NOTE — Progress Notes (Signed)
 Anesthesia Chart Review: Same day workup  68 year old female with history of May-Thurner Syndrome with recent hospitalization August 2024 for acute bilateral PE and left lower extremity DVT. She underwent IVC filter placement with thrombectomy/angioplasty of left iliac and femoral veins and left iliac vein stent placement on 05/19/23.  She had a brief episode of atrial fibrillation during admission.  Unfortunately she experienced thrombosis of the central and peripheral aspects of the left iliac vein stent with non-occlusive thrombus throughout the left popliteal through common femoral veins. She underwent left lower extremity venogram with left iliofemoral aspiration thrombectomy, balloon angioplasty and stent placement 07/16/23. She tolerated the procedure well and was discharged home the same day.  Patient was scheduled for IVC filter retrieval on 09/15/2023.  However, per interventional radiology procedure note, "Findings: Please refer to procedural dictation for full description. 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  Persistent small thrombus adherent to superior aspect of indwelling IVC filter.  Patent IVC.  No filter retrieval performed."  Per interventional radiology, patient is to continue Eliquis  and Plavix .  Family history of malignant hyperthermia in patient's sister in 61.  Patient denies any personal history of anesthesia complications.  She had general anesthesia 08/03/2014 for laparoscopic sigmoid colon resection, triggering agents avoided.  Anesthesia records available in Care Everywhere.  She will need day of surgery labs and evaluation.  TTE 05/15/2023:  1. Right ventricular systolic function is mildly reduced. The right  ventricular size is normal. There is moderately elevated pulmonary artery  systolic pressure. The estimated right ventricular systolic pressure is  49.5 mmHg.   2. Left ventricular ejection fraction, by estimation, is 60 to 65%. The  left ventricle has  normal function. The left ventricle has no regional  wall motion abnormalities.   3. The mitral valve is grossly normal. Trivial mitral valve  regurgitation. No evidence of mitral stenosis.   4. The aortic valve is tricuspid. Aortic valve regurgitation is trivial.  No aortic stenosis is present.   5. The inferior vena cava is normal in size with greater than 50%  respiratory variability, suggesting right atrial pressure of 3 mmHg.   Comparison(s): Changes from prior study are noted. LV function unchanged.  RV function and size unchanged. RVSP elevated up to 49 mmHG on this study.   Coronary calcium  scoring 04/20/2023: IMPRESSION: 1. Coronary calcium  score of 0. This was 1st percentile for age-, race-, and sex-matched controls.   2. Mild aortic atherosclerosis.   Edilia Gordon [facility] Short Stay Center/Anesthesiology Phone [phone] 10/21/2023 12:15 PM

## 2023-10-22 ENCOUNTER — Encounter (HOSPITAL_COMMUNITY): Payer: Self-pay | Admitting: Interventional Radiology

## 2023-10-22 ENCOUNTER — Ambulatory Visit (HOSPITAL_COMMUNITY): Payer: Self-pay | Admitting: Physician Assistant

## 2023-10-22 ENCOUNTER — Ambulatory Visit (HOSPITAL_BASED_OUTPATIENT_CLINIC_OR_DEPARTMENT_OTHER): Payer: Self-pay | Admitting: Physician Assistant

## 2023-10-22 ENCOUNTER — Observation Stay (HOSPITAL_COMMUNITY)
Admission: RE | Admit: 2023-10-22 | Discharge: 2023-10-23 | Disposition: A | Discharge status: Home / Self Care | Payer: PPO | Attending: Interventional Radiology | Admitting: Interventional Radiology

## 2023-10-22 ENCOUNTER — Ambulatory Visit (HOSPITAL_COMMUNITY)
Admission: RE | Admit: 2023-10-22 | Discharge: 2023-10-22 | Disposition: A | Discharge status: Home / Self Care | Payer: PPO | Source: Ambulatory Visit | Attending: Interventional Radiology | Admitting: Interventional Radiology

## 2023-10-22 ENCOUNTER — Encounter (HOSPITAL_COMMUNITY)
Admission: RE | Disposition: A | Discharge status: Home / Self Care | Payer: Self-pay | Source: Home / Self Care | Attending: Interventional Radiology

## 2023-10-22 DIAGNOSIS — E785 Hyperlipidemia, unspecified: Secondary | ICD-10-CM | POA: Diagnosis not present

## 2023-10-22 DIAGNOSIS — T82856A Stenosis of peripheral vascular stent, initial encounter: Principal | ICD-10-CM | POA: Insufficient documentation

## 2023-10-22 DIAGNOSIS — T82868A Thrombosis of vascular prosthetic devices, implants and grafts, initial encounter: Secondary | ICD-10-CM | POA: Insufficient documentation

## 2023-10-22 DIAGNOSIS — Z86711 Personal history of pulmonary embolism: Secondary | ICD-10-CM | POA: Insufficient documentation

## 2023-10-22 DIAGNOSIS — Y832 Surgical operation with anastomosis, bypass or graft as the cause of abnormal reaction of the patient, or of later complication, without mention of misadventure at the time of the procedure: Secondary | ICD-10-CM | POA: Insufficient documentation

## 2023-10-22 DIAGNOSIS — Z86718 Personal history of other venous thrombosis and embolism: Secondary | ICD-10-CM | POA: Diagnosis not present

## 2023-10-22 DIAGNOSIS — I871 Compression of vein: Secondary | ICD-10-CM | POA: Diagnosis not present

## 2023-10-22 DIAGNOSIS — I10 Essential (primary) hypertension: Secondary | ICD-10-CM

## 2023-10-22 DIAGNOSIS — Z9889 Other specified postprocedural states: Secondary | ICD-10-CM

## 2023-10-22 DIAGNOSIS — Z7901 Long term (current) use of anticoagulants: Secondary | ICD-10-CM | POA: Diagnosis not present

## 2023-10-22 DIAGNOSIS — Z79899 Other long term (current) drug therapy: Secondary | ICD-10-CM | POA: Insufficient documentation

## 2023-10-22 DIAGNOSIS — Z7902 Long term (current) use of antithrombotics/antiplatelets: Secondary | ICD-10-CM | POA: Diagnosis not present

## 2023-10-22 DIAGNOSIS — I8290 Acute embolism and thrombosis of unspecified vein: Secondary | ICD-10-CM

## 2023-10-22 HISTORY — PX: IR VENO/EXT/UNI LEFT: IMG675

## 2023-10-22 HISTORY — PX: IR FEM POP ART STENT INC PTA MOD SED: IMG2311

## 2023-10-22 HISTORY — PX: IR VENOCAVAGRAM IVC: IMG678

## 2023-10-22 HISTORY — PX: IR THROMBECT VENO MECH MOD SED: IMG2300

## 2023-10-22 HISTORY — PX: RADIOLOGY WITH ANESTHESIA: SHX6223

## 2023-10-22 HISTORY — DX: Compression of vein: I87.1

## 2023-10-22 HISTORY — PX: IR IVC FILTER RETRIEVAL / S&I /IMG GUID/MOD SED: IMG5308

## 2023-10-22 LAB — CBC
HCT: 38.9 % (ref 36.0–46.0)
Hemoglobin: 13.1 g/dL (ref 12.0–15.0)
MCH: 29.6 pg (ref 26.0–34.0)
MCHC: 33.7 g/dL (ref 30.0–36.0)
MCV: 87.8 fL (ref 80.0–100.0)
Platelets: 155 10*3/uL (ref 150–400)
RBC: 4.43 MIL/uL (ref 3.87–5.11)
RDW: 13.9 % (ref 11.5–15.5)
WBC: 4.3 10*3/uL (ref 4.0–10.5)
nRBC: 0 % (ref 0.0–0.2)

## 2023-10-22 LAB — POCT ACTIVATED CLOTTING TIME
Activated Clotting Time: 135 s
Activated Clotting Time: 187 s

## 2023-10-22 LAB — BASIC METABOLIC PANEL
Anion gap: 9 (ref 5–15)
BUN: 17 mg/dL (ref 8–23)
CO2: 19 mmol/L — ABNORMAL LOW (ref 22–32)
Calcium: 10.1 mg/dL (ref 8.9–10.3)
Chloride: 110 mmol/L (ref 98–111)
Creatinine, Ser: 0.62 mg/dL (ref 0.44–1.00)
GFR, Estimated: >60 mL/min (ref >60)
Glucose, Bld: 105 mg/dL — ABNORMAL HIGH (ref 70–99)
Potassium: 4 mmol/L (ref 3.5–5.1)
Sodium: 138 mmol/L (ref 135–145)

## 2023-10-22 LAB — PROTIME-INR
INR: 1.3 — ABNORMAL HIGH (ref 0.8–1.2)
Prothrombin Time: 16.6 s — ABNORMAL HIGH (ref 11.4–15.2)

## 2023-10-22 LAB — SURGICAL PCR SCREEN
MRSA, PCR: NEGATIVE
Staphylococcus aureus: NEGATIVE

## 2023-10-22 SURGERY — IR WITH ANESTHESIA
Anesthesia: General

## 2023-10-22 MED ORDER — FENTANYL CITRATE (PF) 250 MCG/5ML IJ SOLN
INTRAMUSCULAR | Status: DC | PRN
  Administered 2023-10-22: 25 ug via INTRAVENOUS
  Administered 2023-10-22 (×3): 50 ug via INTRAVENOUS
  Administered 2023-10-22: 25 ug via INTRAVENOUS

## 2023-10-22 MED ORDER — ONDANSETRON HCL 4 MG/2ML IJ SOLN: 4.0000 mg | Freq: Four times a day (QID) | INTRAMUSCULAR | Status: DC | PRN

## 2023-10-22 MED ORDER — HEPARIN SODIUM (PORCINE) 1000 UNIT/ML IJ SOLN
INTRAMUSCULAR | Status: DC | PRN
  Administered 2023-10-22 (×2): 3000 [IU] via INTRAVENOUS
  Administered 2023-10-22: 5000 [IU] via INTRAVENOUS

## 2023-10-22 MED ORDER — SODIUM CHLORIDE 0.9 % IV SOLN: INTRAVENOUS | Status: DC

## 2023-10-22 MED ORDER — ONDANSETRON HCL 4 MG/2ML IJ SOLN: 4.0000 mg | Freq: Once | INTRAMUSCULAR | Status: DC | PRN

## 2023-10-22 MED ORDER — CHLORHEXIDINE GLUCONATE 0.12 % MT SOLN
15.0000 mL | Freq: Once | OROMUCOSAL | Status: AC
  Administered 2023-10-22: 15 mL via OROMUCOSAL
  Filled 2023-10-22: qty 15

## 2023-10-22 MED ORDER — ACETAMINOPHEN 500 MG PO TABS: 1000.0000 mg | ORAL_TABLET | Freq: Four times a day (QID) | ORAL | Status: DC | PRN

## 2023-10-22 MED ORDER — OYSTER SHELL CALCIUM/D3 500-5 MG-MCG PO TABS: 2.0000 | ORAL_TABLET | Freq: Every day | ORAL | Status: DC

## 2023-10-22 MED ORDER — ENOXAPARIN (LOVENOX) PATIENT EDUCATION KIT
PACK | Freq: Once | Status: AC
  Filled 2023-10-22: qty 1

## 2023-10-22 MED ORDER — FENTANYL CITRATE (PF) 100 MCG/2ML IJ SOLN
INTRAMUSCULAR | Status: AC
  Filled 2023-10-22: qty 2

## 2023-10-22 MED ORDER — SUGAMMADEX SODIUM 200 MG/2ML IV SOLN
INTRAVENOUS | Status: DC | PRN
  Administered 2023-10-22: 200 mg via INTRAVENOUS

## 2023-10-22 MED ORDER — IOHEXOL 300 MG/ML  SOLN
150.0000 mL | Freq: Once | INTRAMUSCULAR | Status: AC | PRN
  Administered 2023-10-22: 75 mL via INTRAVENOUS

## 2023-10-22 MED ORDER — ONDANSETRON HCL 4 MG/2ML IJ SOLN
INTRAMUSCULAR | Status: DC | PRN
  Administered 2023-10-22: 4 mg via INTRAVENOUS

## 2023-10-22 MED ORDER — ACETAMINOPHEN 500 MG PO TABS
1000.0000 mg | ORAL_TABLET | Freq: Once | ORAL | Status: AC
  Administered 2023-10-22: 1000 mg via ORAL
  Filled 2023-10-22: qty 2

## 2023-10-22 MED ORDER — OXYCODONE HCL 5 MG PO TABS: 5.0000 mg | ORAL_TABLET | ORAL | Status: DC | PRN

## 2023-10-22 MED ORDER — ORAL CARE MOUTH RINSE: 15.0000 mL | Freq: Once | OROMUCOSAL | Status: AC

## 2023-10-22 MED ORDER — IOHEXOL 300 MG/ML  SOLN: 150.0000 mL | Freq: Once | INTRAMUSCULAR | Status: DC | PRN

## 2023-10-22 MED ORDER — LACTATED RINGERS IV SOLN: INTRAVENOUS | Status: DC | PRN

## 2023-10-22 MED ORDER — ROCURONIUM BROMIDE 10 MG/ML (PF) SYRINGE
PREFILLED_SYRINGE | INTRAVENOUS | Status: DC | PRN
  Administered 2023-10-22 (×2): 30 mg via INTRAVENOUS
  Administered 2023-10-22: 10 mg via INTRAVENOUS
  Administered 2023-10-22: 70 mg via INTRAVENOUS
  Administered 2023-10-22: 20 mg via INTRAVENOUS

## 2023-10-22 MED ORDER — LIDOCAINE 2% (20 MG/ML) 5 ML SYRINGE
INTRAMUSCULAR | Status: DC | PRN
  Administered 2023-10-22: 60 mg via INTRAVENOUS

## 2023-10-22 MED ORDER — ENOXAPARIN SODIUM 100 MG/ML IJ SOSY
85.0000 mg | PREFILLED_SYRINGE | Freq: Two times a day (BID) | INTRAMUSCULAR | Status: DC
  Administered 2023-10-22 – 2023-10-23 (×2): 85 mg via SUBCUTANEOUS
  Filled 2023-10-22 (×3): qty 0.85

## 2023-10-22 MED ORDER — PANTOPRAZOLE SODIUM 40 MG PO TBEC: 40.0000 mg | DELAYED_RELEASE_TABLET | Freq: Every day | ORAL | Status: DC

## 2023-10-22 MED ORDER — LIDOCAINE-EPINEPHRINE 1 %-1:100000 IJ SOLN
INTRAMUSCULAR | Status: AC
  Filled 2023-10-22: qty 1

## 2023-10-22 MED ORDER — MIDAZOLAM HCL 2 MG/2ML IJ SOLN
INTRAMUSCULAR | Status: AC
  Filled 2023-10-22: qty 2

## 2023-10-22 MED ORDER — HYDROMORPHONE HCL 1 MG/ML IJ SOLN: 0.2500 mg | INTRAMUSCULAR | Status: DC | PRN

## 2023-10-22 MED ORDER — OXYCODONE HCL 5 MG/5ML PO SOLN: 5.0000 mg | Freq: Once | ORAL | Status: DC | PRN

## 2023-10-22 MED ORDER — PROPOFOL 500 MG/50ML IV EMUL
INTRAVENOUS | Status: DC | PRN
  Administered 2023-10-22: 125 ug/kg/min via INTRAVENOUS

## 2023-10-22 MED ORDER — PHENYLEPHRINE HCL-NACL 20-0.9 MG/250ML-% IV SOLN
INTRAVENOUS | Status: DC | PRN
  Administered 2023-10-22: 15 ug/min via INTRAVENOUS

## 2023-10-22 MED ORDER — CLOPIDOGREL BISULFATE 75 MG PO TABS
75.0000 mg | ORAL_TABLET | Freq: Every day | ORAL | Status: DC
  Administered 2023-10-23: 75 mg via ORAL
  Filled 2023-10-22: qty 1

## 2023-10-22 MED ORDER — OXYCODONE HCL 5 MG PO TABS: 5.0000 mg | ORAL_TABLET | Freq: Once | ORAL | Status: DC | PRN

## 2023-10-22 MED ORDER — CLOBETASOL PROPIONATE 0.05 % EX OINT
1.0000 | TOPICAL_OINTMENT | Freq: Two times a day (BID) | CUTANEOUS | Status: DC | PRN
  Filled 2023-10-22: qty 15

## 2023-10-22 MED ORDER — LOSARTAN POTASSIUM 25 MG PO TABS
25.0000 mg | ORAL_TABLET | Freq: Every day | ORAL | Status: DC
  Administered 2023-10-23: 25 mg via ORAL
  Filled 2023-10-22: qty 1

## 2023-10-22 MED ORDER — DEXAMETHASONE SODIUM PHOSPHATE 10 MG/ML IJ SOLN
INTRAMUSCULAR | Status: DC | PRN
  Administered 2023-10-22: 5 mg via INTRAVENOUS

## 2023-10-22 MED ORDER — MIDAZOLAM HCL 2 MG/2ML IJ SOLN
INTRAMUSCULAR | Status: DC | PRN
  Administered 2023-10-22 (×2): 1 mg via INTRAVENOUS

## 2023-10-22 MED ORDER — AMISULPRIDE (ANTIEMETIC) 5 MG/2ML IV SOLN: 10.0000 mg | Freq: Once | INTRAVENOUS | Status: DC | PRN

## 2023-10-22 MED ORDER — FOLIC ACID 1 MG PO TABS: 2.0000 mg | ORAL_TABLET | Freq: Every day | ORAL | Status: DC

## 2023-10-22 MED ORDER — OXYCODONE HCL 5 MG PO TABS
10.0000 mg | ORAL_TABLET | ORAL | Status: DC | PRN
  Filled 2023-10-22: qty 2

## 2023-10-22 MED ORDER — PROPOFOL 10 MG/ML IV BOLUS
INTRAVENOUS | Status: DC | PRN
  Administered 2023-10-22: 50 mg via INTRAVENOUS
  Administered 2023-10-22: 150 mg via INTRAVENOUS

## 2023-10-22 NOTE — Sedation Documentation (Signed)
ACT 135

## 2023-10-22 NOTE — Sedation Documentation (Signed)
Patient moved to table by staff , secured, hooked to monitors, and now under the care of anesthesia. Please see charting in vitals per CRNA.

## 2023-10-22 NOTE — Sedation Documentation (Signed)
ACT 187

## 2023-10-22 NOTE — Anesthesia Procedure Notes (Signed)
Procedure Name: Intubation Date/Time: 10/22/2023 10:19 AM  Performed by: Debbe Odea, CRNAPre-anesthesia Checklist: Patient identified, Emergency Drugs available, Suction available and Patient being monitored Patient Re-evaluated:Patient Re-evaluated prior to induction Oxygen Delivery Method: Circle System Utilized Preoxygenation: Pre-oxygenation with 100% oxygen Induction Type: IV induction Ventilation: Mask ventilation without difficulty Laryngoscope Size: Miller and 2 Grade View: Grade I Tube type: Oral Number of attempts: 1 Airway Equipment and Method: Stylet Placement Confirmation: ETT inserted through vocal cords under direct vision, positive ETCO2 and breath sounds checked- equal and bilateral Secured at: 22 cm Tube secured with: Tape Dental Injury: Teeth and Oropharynx as per pre-operative assessment

## 2023-10-22 NOTE — Transfer of Care (Signed)
Immediate Anesthesia Transfer of Care Note  Patient: Heather Hudson  Procedure(s) Performed: LLE venous thrombectomy  Patient Location: PACU  Anesthesia Type:General  Level of Consciousness: awake and sedated  Airway & Oxygen Therapy: Patient Spontanous Breathing and Patient connected to face mask oxygen  Post-op Assessment: Report given to RN and Post -op Vital signs reviewed and stable  Post vital signs: Reviewed and stable  Last Vitals:  Vitals Value Taken Time  BP 131/63 10/22/23 1530  Temp    Pulse 71 10/22/23 1542  Resp 17 10/22/23 1542  SpO2 96 % 10/22/23 1542  Vitals shown include unfiled device data.  Last Pain:  Vitals:   10/22/23 1530  PainSc: 0-No pain         Complications: No notable events documented.

## 2023-10-22 NOTE — Procedures (Signed)
Interventional Radiology Procedure Note  Procedure:  1) Unsuccessful cannulation of popliteal vein 2) IVC filter retrieval 3) Left lower extremity venogram 4) Mechanical and aspiration thrombectomy of indwelling iliac stents 5) Left common femoral venous stent placement  Findings: Please refer to procedural dictation for full description. Complex IVC filter retrieval.  Inability to cannulate popliteal vein in antegrade fashion.  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  Mechanical thrombectomy of indwelling occluded stents with RevCore followed by aspiration.  New 10 mm x 80 mm Abre placed from external iliac stent into common femoral vein at site of scarring and prevention of inflow.    Right internal jugular 20 Fr sheath access.  Complications: None immediate  Estimated Blood Loss: 100 mL  Recommendations: Begin Lovenox 1 mg/kg, continue Plavix. Continue Lovenox for 1 month. Bilateral lower extremity SCDs. Admit to IR for overnight observation. Plan for 1 month CTV abdomen/pelvis and clinic follow up.   Marliss Coots, MD Pager: [phone]

## 2023-10-22 NOTE — Anesthesia Postprocedure Evaluation (Signed)
Anesthesia Post Note  Patient: Heather Hudson  Procedure(s) Performed: LLE venous thrombectomy     Patient location during evaluation: PACU Anesthesia Type: General Level of consciousness: awake and alert, oriented and patient cooperative Pain management: pain level controlled Vital Signs Assessment: post-procedure vital signs reviewed and stable Respiratory status: spontaneous breathing, nonlabored ventilation and respiratory function stable Cardiovascular status: blood pressure returned to baseline and stable Postop Assessment: no apparent nausea or vomiting Anesthetic complications: no   No notable events documented.  Last Vitals:  Vitals:   10/22/23 1530 10/22/23 1600  BP: 131/63 133/75  Pulse: 73 67  Resp: 17 17  Temp:    SpO2: 94% 95%    Last Pain:  Vitals:   10/22/23 1530  PainSc: 0-No pain                 Tennis Must Dequan Kindred

## 2023-10-23 ENCOUNTER — Telehealth (HOSPITAL_COMMUNITY): Payer: Self-pay | Admitting: Pharmacy Technician

## 2023-10-23 ENCOUNTER — Other Ambulatory Visit (HOSPITAL_COMMUNITY): Payer: Self-pay

## 2023-10-23 ENCOUNTER — Encounter (HOSPITAL_COMMUNITY): Payer: Self-pay | Admitting: Interventional Radiology

## 2023-10-23 DIAGNOSIS — T82856A Stenosis of peripheral vascular stent, initial encounter: Secondary | ICD-10-CM | POA: Diagnosis not present

## 2023-10-23 LAB — CBC
HCT: 31.6 % — ABNORMAL LOW (ref 36.0–46.0)
Hemoglobin: 10.8 g/dL — ABNORMAL LOW (ref 12.0–15.0)
MCH: 29.3 pg (ref 26.0–34.0)
MCHC: 34.2 g/dL (ref 30.0–36.0)
MCV: 85.6 fL (ref 80.0–100.0)
Platelets: 153 10*3/uL (ref 150–400)
RBC: 3.69 MIL/uL — ABNORMAL LOW (ref 3.87–5.11)
RDW: 13.7 % (ref 11.5–15.5)
WBC: 5.7 10*3/uL (ref 4.0–10.5)
nRBC: 0 % (ref 0.0–0.2)

## 2023-10-23 LAB — BASIC METABOLIC PANEL
Anion gap: 8 (ref 5–15)
BUN: 14 mg/dL (ref 8–23)
CO2: 21 mmol/L — ABNORMAL LOW (ref 22–32)
Calcium: 10.3 mg/dL (ref 8.9–10.3)
Chloride: 109 mmol/L (ref 98–111)
Creatinine, Ser: 0.64 mg/dL (ref 0.44–1.00)
GFR, Estimated: >60 mL/min (ref >60)
Glucose, Bld: 121 mg/dL — ABNORMAL HIGH (ref 70–99)
Potassium: 4 mmol/L (ref 3.5–5.1)
Sodium: 138 mmol/L (ref 135–145)

## 2023-10-23 MED ORDER — CLOPIDOGREL BISULFATE 75 MG PO TABS
75.0000 mg | ORAL_TABLET | Freq: Every day | ORAL | 1 refills | Status: AC
  Filled 2023-10-23 – 2023-10-24 (×2): qty 30, 30d supply, fill #0

## 2023-10-23 MED ORDER — ENOXAPARIN SODIUM 100 MG/ML IJ SOSY
85.0000 mg | PREFILLED_SYRINGE | Freq: Two times a day (BID) | INTRAMUSCULAR | 1 refills | Status: DC
  Filled 2023-10-23: qty 51, 30d supply, fill #0

## 2023-10-23 NOTE — Care Management Obs Status (Signed)
MEDICARE OBSERVATION STATUS NOTIFICATION   Patient Details  Name: Heather Hudson MRN: [id number] Date of Birth: May 05, 1956  NCM explained form to patient who voiced understanding and signed. Hard copy given to patient  Medicare Observation Status Notification Given:  Yes    Kingsley Plan, RN 10/23/2023, 9:45 AM

## 2023-10-23 NOTE — Telephone Encounter (Signed)
Patient Product/process development scientist completed.    The patient is insured through [employer]/ [employer]. Patient has Medicare and is not eligible for a copay card, but may be able to apply for patient assistance or Medicare RX Payment Plan (Patient Must reach out to their plan, if eligible for payment plan), if available.    Ran test claim for enoxaparin (Lovenox) 100 mg/ml inj and the current 30 day co-pay is $100.00.   This test claim was processed through [facility]- copay amounts may vary at other pharmacies due to pharmacy/plan contracts, or as the patient moves through the different stages of their insurance plan.     Roland Earl, CPHT Pharmacy Technician III Certified Patient Advocate [facility] Patient Advocate Team Direct Number: [phone]  Fax: [phone]

## 2023-10-23 NOTE — Discharge Summary (Signed)
Patient ID: Nickeya Tak MRN: [id number] DOB/AGE: [birth year] 67 y.o.  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  Several therapeutic and curative intervention have been attempted by Dr. Elby Showers: 05/19/23:  LLE angiograms with intervention includeing IVC filter placement, angiogram/thrombectomy of the left iliofemoral veins with stent placement  from the left common through left external iliac veins.  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  06/23/23: CT Venogram showed thrombosis of the central and peripheral aspects of the left iliac vein stent and demonstrated non-occlusive thrombus throughout the left popliteal through common femoral veins despite initiating Eliquis. 07/16/23: Due to painless swelling with the extent of thrombus she underwent left lower extremity venogram with left iliofemoral aspiration thrombectomy, balloon angioplasty and stent placement.Transition was made for lovenox injections BID, Plavix 75mg  daily.  08/21/23: follow-up CT Venogram showed patency of the stents with slight interval growth of focal IVC thrombus adherent to the indwelling IVC filter.  09/15/23:  Attempted IVC filter retrevial unsuccessful due to worsening intra-stent stenosis.  10/22/23:  Patient returns for additional intervention and management of her progressive iliocaval thrombus with intra-stent stenosis and adherent  IVC filter.  She was able to undergo successful IVC retrieval with limited removal of adherent IVC clot, mechanical aspiration and thrombectomy of indwelling iliac stents, left common femoral venous stent placement, with unsuccessful cannulation of popliteal vein.   Patient was admitted overnight for observation.  She has recovered well with expected hospital course.  Her hemoglobin dropped overnight from 13.1  10.2 consistent with small amount of blood loss from thrombectomy.  She is asymptomatic.  She has otherwise been eating, drinking well with tolerance.  No pain endorsed.  She has been able to ambulate without difficulty.  She is stable for discharge home with ongoing Lovenox injections BID, Plavix 75mg  daily.     She is aware of plans for return for CT Venogram and follow-up with Dr. Elby Showers in 1 month.    All questions answered.  AVS completed.  Prescriptions sent to patient through [facility] pharmacy prior to d/c.   Discharge Exam: Blood pressure (!) 142/67, pulse (!) 55, temperature (!) 97.4 F (36.3 C), temperature source Oral, resp. rate 18, height 5\' 8"  (1.727 m), weight 180 lb (81.6 kg), SpO2 100%. General appearance: alert, cooperative, and no distress Resp: clear to auscultation bilaterally Cardio: regular rate and rhythm, S1, S2 normal, no murmur, click, rub or gallop GI: soft, non-tender; bowel sounds normal; no masses,  no organomegaly Skin: Skin color, texture, turgor normal. No rashes or lesions Incision/Wound: intact, dressings clean and dry.   Disposition: Discharge disposition: 01-Home or Self Care       Discharge Instructions     Diet - low sodium heart healthy   Complete by: As directed    Discharge instructions   Complete by: As directed    Increase activity slowly.   May shower.  Continue Plavix daily, lovenox injections twice daily.  Follow-up with Dr. Elby Showers in 1 month.  Expect repeat  imaging prior to appointment. Schedulers will call to arrange.   Increase  activity slowly   Complete by: As directed       Allergies as of 10/23/2023       Reactions   Aspirin Hives   Ibuprofen Hives   Other    Anesthesia (familial) SISTER HAD MALIGNANT HYPERTHERMIA   Propofol Other (See Comments)   Mother had issues with it        Medication List     STOP taking these medications    apixaban 5 MG Tabs tablet Commonly known as: ELIQUIS       TAKE these medications    Blood Glucose Monitoring Suppl Devi 1 each by Does not apply route in the morning, at noon, and at bedtime. May substitute to any manufacturer covered by patient's insurance.   Calcium Carb-Cholecalciferol 600-12.5 MG-MCG Tabs Take 2 tablets by mouth daily.   clobetasol 0.05 % Gel Commonly known as: TEMOVATE Apply 1 Application topically 2 (two) times daily as needed.   clopidogrel 75 MG tablet Commonly known as: Plavix Take 1 tablet (75 mg total) by mouth daily. What changed: Another medication with the same name was added. Make sure you understand how and when to take each.   clopidogrel 75 MG tablet Commonly known as: Plavix Take 1 tablet (75 mg total) by mouth daily. Start taking on: October 24, 2023 What changed: You were already taking a medication with the same name, and this prescription was added. Make sure you understand how and when to take each.   enoxaparin 100 MG/ML injection Commonly known as: LOVENOX Inject 0.85 mLs (85 mg total) into the skin every 12 (twelve) hours.   folic acid 1 MG tablet Commonly known as: FOLVITE Take 2 mg by mouth daily.   glucose blood test strip Use to check blood sugar up to three times daily.   Lancets Misc. Misc 1 each by Does not apply route in the morning, at noon, and at bedtime. May substitute to any manufacturer covered by patient's insurance.   losartan 25 MG tablet Commonly known as: COZAAR TAKE 1 TABLET BY MOUTH EVERY DAY FOR BLOOD PRESSURE   omeprazole 20 MG capsule Commonly known as: PRILOSEC Take 20  mg by mouth daily.        Follow-up Information     [facility] Imaging Follow up.   Specialty: Radiology Why: IR scheduler will call you to setup 1 month follow up appointment. Please call [phone] with questions or concerns. Contact information: [address] [place] [address] [phone]                 Electronically Signed: Hoyt Koch, PA 10/23/2023, 1:48 PM   I have spent Greater Than 30 Minutes discharging Barrie Folk.

## 2023-10-23 NOTE — Progress Notes (Signed)
   10/23/23 0950  TOC Brief Assessment  Insurance and Status Reviewed  Patient has primary care physician Yes  Home environment has been reviewed spouse  Prior level of function: independent  Prior/Current Home Services No current home services  Social Drivers of Health Review SDOH reviewed no interventions necessary  Readmission risk has been reviewed No  Transition of care needs no transition of care needs at this time     Patient from home with husband . Aware of Lovenox co pay $100.00 for 30 day supply. She has done Lovenox last October. Bedside nurse will provide education prior to discharge

## 2023-10-23 NOTE — Plan of Care (Signed)

## 2023-10-25 ENCOUNTER — Other Ambulatory Visit: Payer: Self-pay

## 2023-11-03 ENCOUNTER — Other Ambulatory Visit: Payer: Self-pay | Admitting: Interventional Radiology

## 2023-11-03 DIAGNOSIS — I871 Compression of vein: Secondary | ICD-10-CM

## 2023-11-03 DIAGNOSIS — I8290 Acute embolism and thrombosis of unspecified vein: Secondary | ICD-10-CM

## 2023-11-04 ENCOUNTER — Ambulatory Visit
Admission: RE | Admit: 2023-11-04 | Discharge: 2023-11-04 | Disposition: A | Discharge status: Home / Self Care | Payer: PPO | Source: Ambulatory Visit | Attending: Hematology and Oncology | Admitting: Hematology and Oncology

## 2023-11-04 ENCOUNTER — Ambulatory Visit
Admission: RE | Admit: 2023-11-04 | Discharge: 2023-11-04 | Discharge status: Home / Self Care | Payer: PPO | Source: Ambulatory Visit | Attending: Physician Assistant

## 2023-11-04 DIAGNOSIS — I871 Compression of vein: Secondary | ICD-10-CM

## 2023-11-04 DIAGNOSIS — I2699 Other pulmonary embolism without acute cor pulmonale: Secondary | ICD-10-CM

## 2023-11-04 DIAGNOSIS — I8290 Acute embolism and thrombosis of unspecified vein: Secondary | ICD-10-CM

## 2023-11-04 MED ORDER — IOPAMIDOL (ISOVUE-370) INJECTION 76%
100.0000 mL | Freq: Once | INTRAVENOUS | Status: AC | PRN
  Administered 2023-11-04: 100 mL via INTRAVENOUS

## 2023-11-13 ENCOUNTER — Ambulatory Visit
Admission: RE | Admit: 2023-11-13 | Discharge: 2023-11-13 | Disposition: A | Discharge status: Home / Self Care | Payer: PPO | Source: Ambulatory Visit | Attending: Interventional Radiology

## 2023-11-13 ENCOUNTER — Other Ambulatory Visit: Payer: PPO

## 2023-11-13 DIAGNOSIS — I871 Compression of vein: Secondary | ICD-10-CM

## 2023-11-13 DIAGNOSIS — I8290 Acute embolism and thrombosis of unspecified vein: Secondary | ICD-10-CM

## 2023-11-13 HISTORY — PX: IR RADIOLOGIST EVAL & MGMT: IMG5224

## 2023-11-13 NOTE — Progress Notes (Signed)
 Referring Physician(s): Dr. Leatrice Chapel, Dr. 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  She unfortunately experienced in-stent thrombus and therefore the stents were recanned and revised, extending into the left common femoral vein on 10/22/23.  The IVC filter removed at this time.  She was started on Lovenox  and discharged home the next day.  She has been doing well since the procedure but has experienced shortness of breath.  She continues to work with PT due to her left ankle.  She has remained compliant with Lovenox  and runs out next week.  She states that her left lower extremity feels great, and is symmetric in size to the right lower extremity.  No pain or swelling.     Past Medical History:  Diagnosis Date   Adenomatous colon polyp 2015   Allergy 1974   teenager, and 2008   Colovesical fistula 2015   COVID-19 06/2020   Environmental and seasonal allergies    Hx of adenomatous colonic polyps 01/19/2021   Hypertension 11/23   Low dose prescription   Lower abdominal pain 12/23/2019   May-Thurner syndrome    Migraines    Motion sickness    Persistent cough for 3 weeks or longer 10/10/2021    Past Surgical History:  Procedure Laterality Date   COLON SURGERY  2016   fistula, small section removed   COLONOSCOPY  2015   Plus others   IR FEM POP ART STENT INC PTA MOD SED  10/22/2023   IR INTRAVASCULAR ULTRASOUND NON CORONARY  05/19/2023   IR INTRAVASCULAR ULTRASOUND NON CORONARY  07/16/2023   IR IVC FILTER PLMT / S&I /IMG GUID/MOD SED  05/19/2023   IR IVC FILTER RETRIEVAL / S&I /IMG GUID/MOD SED  10/22/2023   IR IVUS EACH ADDITIONAL NON CORONARY VESSEL  09/15/2023   IR PTA VENOUS  EXCEPT DIALYSIS CIRCUIT  05/19/2023   IR PTA VENOUS EXCEPT DIALYSIS CIRCUIT  07/16/2023   IR RADIOLOGIST EVAL & MGMT  07/01/2023   IR RADIOLOGIST EVAL & MGMT  08/21/2023   IR THROMBECT VENO MECH MOD SED  05/19/2023   IR THROMBECT VENO MECH MOD SED  07/16/2023   IR THROMBECT VENO MECH MOD SED  10/22/2023   IR TRANSCATH PLC STENT 1ST ART NOT LE CV CAR VERT CAR  05/19/2023   IR TRANSCATH PLC STENT 1ST ART NOT LE CV CAR VERT CAR  07/16/2023   IR US  GUIDE VASC ACCESS LEFT  05/19/2023   IR US  GUIDE VASC ACCESS LEFT  07/16/2023   IR US  GUIDE VASC ACCESS RIGHT  05/19/2023   IR US  GUIDE VASC ACCESS RIGHT  09/15/2023   IR VENO/EXT/UNI LEFT  05/19/2023   IR VENO/EXT/UNI LEFT  07/16/2023   IR VENO/EXT/UNI LEFT  10/22/2023   IR VENOCAVAGRAM IVC  05/19/2023   IR VENOCAVAGRAM IVC  07/16/2023   IR VENOCAVAGRAM IVC  09/15/2023   IR VENOCAVAGRAM IVC  10/22/2023   LAPAROSCOPIC LEFT COLON RESECTION  2015   Colovesical fistula, [place]   Left peroneal tendon repair Left 2023   RADIOLOGY WITH ANESTHESIA N/A 10/22/2023   Procedure: LLE venous thrombectomy;  Surgeon: Jennefer Ester PARAS, MD;  Location: [facility] OR;  Service: Radiology;  Laterality: N/A;   WISDOM TOOTH  EXTRACTION      Allergies: Aspirin, Ibuprofen, Other, and Propofol   Medications: Prior to Admission medications   Medication Sig Start Date End Date Taking? Authorizing Provider  Blood Glucose Monitoring Suppl DEVI 1 each by Does not apply route in the morning, at noon, and at bedtime. May substitute to any manufacturer covered by patient's insurance. 07/27/23   Gretta Comer POUR, NP  Calcium  Carb-Cholecalciferol 600-12.5 MG-MCG TABS Take 2 tablets by mouth daily.    [provider]  clobetasol  (TEMOVATE ) 0.05 % GEL Apply 1 Application topically 2 (two) times daily as needed. 06/18/23   [provider]  clopidogrel  (PLAVIX ) 75 MG tablet Take 1 tablet (75 mg total) by mouth daily. 07/16/23 07/10/24  Covington, Jamie R, NP  clopidogrel  (PLAVIX )  75 MG tablet Take 1 tablet (75 mg total) by mouth daily. 10/24/23 12/23/23  Matthews, Kacie Sue-Ellen, PA  enoxaparin  (LOVENOX ) 100 MG/ML injection Inject 0.85 mLs (85 mg total) into the skin every 12 (twelve) hours. 10/23/23 12/22/23  Matthews, Kacie Sue-Ellen, PA  folic acid  (FOLVITE ) 1 MG tablet Take 2 mg by mouth daily. 09/14/23   [provider]  glucose blood test strip Use to check blood sugar up to three times daily. 08/04/23   Gretta Comer POUR, NP  Lancets Misc. MISC 1 each by Does not apply route in the morning, at noon, and at bedtime. May substitute to any manufacturer covered by patient's insurance. 07/27/23   Clark, Katherine K, NP  losartan  (COZAAR ) 25 MG tablet TAKE 1 TABLET BY MOUTH EVERY DAY FOR BLOOD PRESSURE 01/21/23   Clark, Katherine K, NP  omeprazole (PRILOSEC) 20 MG capsule Take 20 mg by mouth daily.    [provider]     Family History  Problem Relation Age of Onset   Heart disease Mother    Diabetes Mother    Hearing loss Mother    Heart disease Father    Prostate cancer Father    Alcohol abuse Father    Cancer Father    COPD Father    Malignant hyperthermia Sister    Pancreatic cancer Maternal Aunt    Diabetes Brother    Cancer Maternal Grandmother    Miscarriages / Stillbirths Maternal Grandmother    Diabetes Brother    Intellectual disability Brother    Learning disabilities Brother    Vision loss Brother    Miscarriages / Stillbirths Sister    Miscarriages / Stillbirths Sister    Colon cancer Neg Hx    Esophageal cancer Neg Hx    Stomach cancer Neg Hx    Liver disease Neg Hx    Breast cancer Neg Hx     Social History   Socioeconomic History   Marital status: Married    Spouse name: Not on file   Number of children: Not on file   Years of education: Not on file   Highest education level: Associate degree: occupational, scientist, product/process development, or vocational program  Occupational History   Not on file  Tobacco Use   Smoking status: Never    Smokeless tobacco: Never   Tobacco comments:    Never smoked  Vaping Use   Vaping status: Never Used  Substance and Sexual Activity   Alcohol use: Yes    Alcohol/week: 5.0 standard drinks of alcohol    Types: 5 Glasses of wine per week   Drug use: Never   Sexual activity: Yes    Birth control/protection: Post-menopausal  Other Topics Concern   Not on file  Social History Narrative   Married.  No children.   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   Enjoys wine tasting.    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 [employer]   Social Drivers of Corporate Investment Banker Strain: Low Risk  (10/01/2023)   Overall Financial Resource Strain (CARDIA)    Difficulty of Paying Living Expenses: Not hard at all  Food Insecurity: No Food Insecurity (10/22/2023)   Hunger Vital Sign    Worried About Running Out of Food in the Last Year: Never true    Ran Out of Food in the Last Year: Never true  Transportation Needs: No Transportation Needs (10/22/2023)   PRAPARE - Administrator, Civil Service (Medical): No    Lack of Transportation (Non-Medical): No  Physical Activity: Unknown (10/01/2023)   Exercise Vital Sign    Days of Exercise per Week: 0 days    Minutes of Exercise per Session: Not on file  Stress: No Stress Concern Present (10/01/2023)   [employer] of Occupational Health - Occupational Stress Questionnaire    Feeling of Stress : Not at all  Social Connections: Socially Integrated (10/22/2023)   Social Connection and Isolation Panel [NHANES]    Frequency of Communication with Friends and Family: More than three times a week    Frequency of Social Gatherings with Friends and Family: More than three times a week    Attends Religious  Services: More than 4 times per year    Active Member of [employer] or Organizations: Yes    Attends Engineer, Structural: More than 4 times per year    Marital Status: Married     Vital Signs: There were no vitals taken for this visit.  No physical examination was performed in lieu of virtual telephone clinic visit.   Imaging: CT Chest 11/04/23  Evolving pulmonary infarct, sequela of prior PE  CT AP 11/04/23  Patent left femoral - iliac stents.  Small focal wall-adherent non-occlusive IVC thrombus persists.  Labs:  CBC: Recent Labs    05/21/23 0643 07/16/23 0927 10/22/23 0900 10/23/23 0653  WBC 6.1 6.2 4.3 5.7  HGB 8.7* 12.7 13.1 10.8*  HCT 25.5* 38.2 38.9 31.6*  PLT 265 190 155 153    COAGS: Recent Labs    05/14/23 1930 05/15/23 0146 07/16/23 0927 10/22/23 0900  INR 1.1 1.1 1.1 1.3*  APTT 32 65*  --   --     BMP: Recent Labs    07/16/23 0927 09/15/23 0709 10/22/23 0900 10/23/23 0653  NA 142 138 138 138  K 3.5 3.6 4.0 4.0  CL 112* 112* 110 109  CO2 20* 19* 19* 21*  GLUCOSE 94 110* 105* 121*  BUN 9 9 17 14   CALCIUM  10.3 9.8 10.1 10.3  CREATININE 0.71 0.69 0.62 0.64  GFRNONAA >60 >60 >60 >60    LIVER FUNCTION TESTS: Recent Labs    01/20/23 0830 05/14/23 1749 05/15/23 0146 05/18/23 1129 05/20/23 1245 05/21/23 0645 05/22/23 1104  BILITOT 0.6 1.5* 1.3*  --   --   --   --   AST 22 26 21   --   --   --   --   ALT 42* 42 39  --   --   --   --  ALKPHOS 80 74 74  --   --   --   --   PROT 6.9 6.5 5.9*  --   --   --   --   ALBUMIN 4.3 3.7 3.0* 2.6* 2.6* 2.4* 2.6*    Assessment and Plan: 68 year old female with history of May Thurner syndrome with VTE now status post left femoral --> iliac stenting with revisions.  Current imaging demonstrates patency after last intervention on 10/22/23. 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  -continue Lovenox  until all doses have be used then convert to eliquis  5 mg BID at the next due dose -continue plavix  75 mg every day -continued encouraged use of LLE compression hose -CT chest with contrast and CTV abdomen pelvis in 3 months -IR clinic visit shortly thereafter  Ester Sides, MD Pager: [phone]    I spent a total of 25 Minutes in virtual telephone clinical consultation, greater than 50% of which was counseling/coordinating care for May Thurner Syndrome.

## 2023-11-16 ENCOUNTER — Telehealth: Payer: Self-pay | Admitting: Hematology and Oncology

## 2023-11-16 ENCOUNTER — Inpatient Hospital Stay: Payer: PPO | Attending: Hematology and Oncology | Admitting: Hematology and Oncology

## 2023-11-16 VITALS — BP 146/70 | HR 65 | Temp 97.7°F | Resp 18 | Ht 68.0 in | Wt 182.9 lb

## 2023-11-16 DIAGNOSIS — Z79899 Other long term (current) drug therapy: Secondary | ICD-10-CM | POA: Diagnosis not present

## 2023-11-16 DIAGNOSIS — I82503 Chronic embolism and thrombosis of unspecified deep veins of lower extremity, bilateral: Secondary | ICD-10-CM | POA: Insufficient documentation

## 2023-11-16 DIAGNOSIS — Z7901 Long term (current) use of anticoagulants: Secondary | ICD-10-CM | POA: Diagnosis not present

## 2023-11-16 DIAGNOSIS — Z8616 Personal history of COVID-19: Secondary | ICD-10-CM | POA: Diagnosis not present

## 2023-11-16 DIAGNOSIS — Z7952 Long term (current) use of systemic steroids: Secondary | ICD-10-CM | POA: Insufficient documentation

## 2023-11-16 DIAGNOSIS — I2699 Other pulmonary embolism without acute cor pulmonale: Secondary | ICD-10-CM | POA: Diagnosis not present

## 2023-11-16 MED ORDER — APIXABAN 5 MG PO TABS: 5.0000 mg | ORAL_TABLET | Freq: Two times a day (BID) | ORAL | Status: DC

## 2023-11-16 NOTE — Telephone Encounter (Signed)
 Scheduled appointment per 2/10 los. Left VM with appointment details.

## 2023-11-16 NOTE — Progress Notes (Signed)
 Patient Care Team: Gabriel John, NP as PCP - General (Internal Medicine)  DIAGNOSIS:  Encounter Diagnosis  Name Primary?   Other acute pulmonary embolism without acute cor pulmonale (HCC) Yes    CHIEF COMPLIANT: Follow-up of acute/chronic pulmonary emboli  HISTORY OF PRESENT ILLNESS:  History of Present Illness   Heather Hudson "Heather Hudson" is a 68 year old female with a history of blood clots who presents for follow-up regarding anticoagulation management.  She recently had an IVC filter removed but continues to have residual blood clotting. She is currently on Eliquis  and is finishing a course of Lovenox  injections, with the last injection scheduled for tomorrow. No side effects from Eliquis , unlike her previous experience with metformin , which was intolerable. She finds the Lovenox  injections challenging but manageable.  She has a history of shortness of breath, which led to a CT scan last month revealing some scarring in her lungs. The scarring is reportedly diminishing.  She experiences significant swelling in her left leg, which was notably larger than her right leg around Christmas time. The swelling has since decreased significantly. She wears compression socks daily, especially during long trips, to manage the swelling.  She has a history of ankle tendon surgery and is experiencing new tears in the same tendon. Reports nerve sensitivity in the area but is able to walk without a boot. She is seeking a second opinion regarding her ankle condition.         ALLERGIES:  is allergic to aspirin, ibuprofen, other, and propofol .  MEDICATIONS:  Current Outpatient Medications  Medication Sig Dispense Refill   apixaban  (ELIQUIS ) 5 MG TABS tablet Take 1 tablet (5 mg total) by mouth 2 (two) times daily.     Blood Glucose Monitoring Suppl DEVI 1 each by Does not apply route in the morning, at noon, and at bedtime. May substitute to any manufacturer covered by patient's insurance. 1  each 0   Calcium  Carb-Cholecalciferol 600-12.5 MG-MCG TABS Take 2 tablets by mouth daily.     clobetasol  (TEMOVATE ) 0.05 % GEL Apply 1 Application topically 2 (two) times daily as needed.     clopidogrel  (PLAVIX ) 75 MG tablet Take 1 tablet (75 mg total) by mouth daily. 90 tablet 3   clopidogrel  (PLAVIX ) 75 MG tablet Take 1 tablet (75 mg total) by mouth daily. 30 tablet 1   folic acid  (FOLVITE ) 1 MG tablet Take 2 mg by mouth daily.     losartan  (COZAAR ) 25 MG tablet TAKE 1 TABLET BY MOUTH EVERY DAY FOR BLOOD PRESSURE 90 tablet 3   omeprazole (PRILOSEC) 20 MG capsule Take 20 mg by mouth daily.     No current facility-administered medications for this visit.    PHYSICAL EXAMINATION: ECOG PERFORMANCE STATUS: 1 - Symptomatic but completely ambulatory  Vitals:   11/16/23 0856  BP: (!) 146/70  Pulse: 65  Resp: 18  Temp: 97.7 F (36.5 C)  SpO2: 100%   Filed Weights   11/16/23 0856  Weight: 182 lb 14.4 oz (83 kg)    Physical Exam          (exam performed in the presence of a chaperone)  LABORATORY DATA:  I have reviewed the data as listed    Latest Ref Rng & Units 10/23/2023    6:53 AM 10/22/2023    9:00 AM 09/15/2023    7:09 AM  CMP  Glucose 70 - 99 mg/dL 161  096  045   BUN 8 - 23 mg/dL 14  17  9   Creatinine 0.44 - 1.00 mg/dL 0.27  2.53  6.64   Sodium 135 - 145 mmol/L 138  138  138   Potassium 3.5 - 5.1 mmol/L 4.0  4.0  3.6   Chloride 98 - 111 mmol/L 109  110  112   CO2 22 - 32 mmol/L 21  19  19    Calcium  8.9 - 10.3 mg/dL 40.3  47.4  9.8     Lab Results  Component Value Date   WBC 5.7 10/23/2023   HGB 10.8 (L) 10/23/2023   HCT 31.6 (L) 10/23/2023   MCV 85.6 10/23/2023   PLT 153 10/23/2023   NEUTROABS 5.1 05/21/2023    ASSESSMENT & PLAN:  Acute pulmonary embolism (HCC) Acute pulmonary embolism and bilateral lower extremity DVT: 05/14/2023: Acute bilateral PE involving both main pulmonary arteries, numerous lobar segmental subsegmental arteries with right heart  strain right middle lobe wedge-shaped.  consolidation: Infarcts   No clear-cut predisposing factors. (Prior history of COVID-19, autoimmune/mucocutaneous pemphigoid, on prednisone  and methotrexate ) Status post thrombectomy and thrombolysis   Hypercoagulability workup: Negative for factor V Leiden gene mutation, normal protein C protein S and Antithrombin levels.  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  Successful IVC filter retrieval and thrombectomy of indwelling ileal stents, left common femoral vein stent placement  Current treatment: Lovenox  Duration of anticoagulation: Lifelong CT angiogram 11/04/2023: Significant resolution of previous PE, very small volume residual nonocclusive PE in the right lower lobe, 4 mm subpleural nodule left lower lobe CT venogram 11/07/2023: Post IVC filter retrieval persistent nonocclusive chronic thrombus suprarenal IVC  Return to clinic by telephone visit in 1 year If she did not get CT angiogram chest by then we will order it prior to her telephone visit in 1 year.     No orders of the defined types were placed in this encounter.  The patient has a good understanding of the overall plan. she agrees with it. she will call with any problems that may develop before the next visit here. Total time spent: 30 mins including face to face time and time spent for planning, charting and co-ordination of care   Viinay K Logan [place], MD 11/16/23

## 2023-11-16 NOTE — Assessment & Plan Note (Signed)
 Acute pulmonary embolism and bilateral lower extremity DVT: 05/14/2023: Acute bilateral PE involving both main pulmonary arteries, numerous lobar segmental subsegmental arteries with right heart strain right middle lobe wedge-shaped.  consolidation: Infarcts   No clear-cut predisposing factors. (Prior history of COVID-19, autoimmune/mucocutaneous pemphigoid, on prednisone  and methotrexate ) Status post thrombectomy and thrombolysis   Hypercoagulability workup: Negative for factor V Leiden gene mutation, normal protein C protein S and Antithrombin levels.  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  Successful IVC filter retrieval and thrombectomy of indwelling ileal stents, left common femoral vein stent placement  Current treatment: Lovenox  Duration of anticoagulation: Lifelong CT angiogram 11/04/2023: Significant resolution of previous PE, very small volume residual nonocclusive PE in the right lower lobe, 4 mm subpleural nodule left lower lobe CT venogram 11/07/2023: Post IVC filter retrieval persistent nonocclusive chronic thrombus suprarenal IVC  Return to clinic on an as-needed basis.

## 2023-11-17 ENCOUNTER — Other Ambulatory Visit: Payer: PPO

## 2023-11-17 ENCOUNTER — Other Ambulatory Visit: Payer: Self-pay | Admitting: Hematology and Oncology

## 2023-11-18 ENCOUNTER — Other Ambulatory Visit: Payer: Self-pay

## 2023-11-18 ENCOUNTER — Ambulatory Visit (INDEPENDENT_AMBULATORY_CARE_PROVIDER_SITE_OTHER): Payer: PPO | Admitting: Primary Care

## 2023-11-18 ENCOUNTER — Encounter: Payer: Self-pay | Admitting: Primary Care

## 2023-11-18 VITALS — BP 130/82 | HR 60 | Temp 97.4°F | Ht 68.0 in | Wt 182.0 lb

## 2023-11-18 DIAGNOSIS — E1165 Type 2 diabetes mellitus with hyperglycemia: Secondary | ICD-10-CM | POA: Diagnosis not present

## 2023-11-18 LAB — POCT GLYCOSYLATED HEMOGLOBIN (HGB A1C): Hemoglobin A1C: 4.9 % (ref 4.0–5.6)

## 2023-11-18 MED ORDER — APIXABAN 5 MG PO TABS
5.0000 mg | ORAL_TABLET | Freq: Two times a day (BID) | ORAL | 12 refills | Status: AC
  Filled 2023-11-18: qty 60, 30d supply, fill #0
  Filled 2024-01-18: qty 60, 30d supply, fill #1

## 2023-11-18 NOTE — Progress Notes (Signed)
Subjective:    Patient ID: Heather Hudson, female    DOB: September 05, 1956, 68 y.o.   MRN: [id number]  HPI  Arwa Yero is a very pleasant 68 y.o. female with a history of hypertension, acute pulmonary emboli, type 2 diabetes, May-Thurner syndrome, hyperlipidemia, thrombectomy, IVC filter with removal who presents today for follow-up of diabetes.  Current medications include: None  She is checking her blood glucose 0 times daily. She does have a glucometer at home. There is no plan to resume steroids for her gums.   Last A1C: 7.6 in August 2024, 4.8 in November 2024, 4.6 today.  Last Eye Exam: Up-to-date Last Foot Exam: Up-to-date Pneumonia Vaccination: Never completed Urine Microalbumin: Up-to-date Statin: None.  Dietary changes since last visit: Home cooked meals, using an APP to scan foods. She has limited sugar intake overall.    Exercise: None.   BP Readings from Last 3 Encounters:  11/18/23 130/82  11/16/23 (!) 146/70  10/23/23 (!) 142/67   Wt Readings from Last 3 Encounters:  11/18/23 182 lb (82.6 kg)  11/16/23 182 lb 14.4 oz (83 kg)  10/22/23 180 lb (81.6 kg)     Review of Systems  Respiratory:  Negative for shortness of breath.   Cardiovascular:  Negative for chest pain.  Musculoskeletal:  Positive for arthralgias.  Neurological:  Negative for numbness.         Past Medical History:  Diagnosis Date   Adenomatous colon polyp 2015   Allergy 1974   teenager, and 2008   Colovesical fistula 2015   COVID-19 06/2020   Environmental and seasonal allergies    Hx of adenomatous colonic polyps 01/19/2021   Hypertension 11/23   Low dose prescription   Lower abdominal pain 12/23/2019   May-Thurner syndrome    Migraines    Motion sickness    Persistent cough for 3 weeks or longer 10/10/2021    Social History   Socioeconomic History   Marital status: Married    Spouse name: Not on file   Number of children: Not on file   Years of education: Not on file    Highest education level: Associate degree: occupational, Scientist, product/process development, or vocational program  Occupational History   Not on file  Tobacco Use   Smoking status: Never   Smokeless tobacco: Never   Tobacco comments:    Never smoked  Vaping Use   Vaping status: Never Used  Substance and Sexual Activity   Alcohol use: Yes    Alcohol/week: 5.0 standard drinks of alcohol    Types: 5 Glasses of wine per week   Drug use: Never   Sexual activity: Yes    Birth control/protection: Post-menopausal  Other Topics Concern   Not on file  Social History Narrative   Married.  No children.   Moved from [place] area [place] in 2018    Worked in [employer], Insurance account manager.  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   Enjoys wine tasting.    1-2 caffeinated drinks a day 0-1 alcoholic never smoker no drug use   Sister Mardene Celeste works Adult nurse endoscopy as Charity fundraiser   Social Drivers of Corporate investment banker Strain: Low Risk  (10/01/2023)   Overall Financial Resource Strain (CARDIA)    Difficulty of Paying Living Expenses: Not hard at all  Food Insecurity: No Food Insecurity (10/22/2023)   Hunger Vital Sign    Worried About Running Out of  Food in the Last Year: Never true    Ran Out of Food in the Last Year: Never true  Transportation Needs: No Transportation Needs (10/22/2023)   PRAPARE - Administrator, Civil Service (Medical): No    Lack of Transportation (Non-Medical): No  Physical Activity: Unknown (10/01/2023)   Exercise Vital Sign    Days of Exercise per Week: 0 days    Minutes of Exercise per Session: Not on file  Stress: No Stress Concern Present (10/01/2023)   [employer] of Occupational Health - Occupational Stress Questionnaire    Feeling of Stress : Not at all  Social Connections: Socially Integrated (10/22/2023)   Social Connection and Isolation Panel [NHANES]    Frequency of  Communication with Friends and Family: More than three times a week    Frequency of Social Gatherings with Friends and Family: More than three times a week    Attends Religious Services: More than 4 times per year    Active Member of Clubs or Organizations: Yes    Attends Engineer, structural: More than 4 times per year    Marital Status: Married  Catering manager Violence: Not At Risk (10/22/2023)   Humiliation, Afraid, Rape, and Kick questionnaire    Fear of Current or Ex-Partner: No    Emotionally Abused: No    Physically Abused: No    Sexually Abused: No    Past Surgical History:  Procedure Laterality Date   COLON SURGERY  2016   fistula, small section removed   COLONOSCOPY  2015   Plus others   IR FEM POP ART STENT INC PTA MOD SED  10/22/2023   IR INTRAVASCULAR ULTRASOUND NON CORONARY  05/19/2023   IR INTRAVASCULAR ULTRASOUND NON CORONARY  07/16/2023   IR IVC FILTER PLMT / S&I /IMG GUID/MOD SED  05/19/2023   IR IVC FILTER RETRIEVAL / S&I /IMG GUID/MOD SED  10/22/2023   IR IVUS EACH ADDITIONAL NON CORONARY VESSEL  09/15/2023   IR PTA VENOUS EXCEPT DIALYSIS CIRCUIT  05/19/2023   IR PTA VENOUS EXCEPT DIALYSIS CIRCUIT  07/16/2023   IR RADIOLOGIST EVAL & MGMT  07/01/2023   IR RADIOLOGIST EVAL & MGMT  08/21/2023   IR RADIOLOGIST EVAL & MGMT  11/13/2023   IR THROMBECT VENO MECH MOD SED  05/19/2023   IR THROMBECT VENO MECH MOD SED  07/16/2023   IR THROMBECT VENO MECH MOD SED  10/22/2023   IR TRANSCATH PLC STENT 1ST ART NOT LE CV CAR VERT CAR  05/19/2023   IR TRANSCATH PLC STENT 1ST ART NOT LE CV CAR VERT CAR  07/16/2023   IR US GUIDE VASC ACCESS LEFT  05/19/2023   IR US GUIDE VASC ACCESS LEFT  07/16/2023   IR US GUIDE VASC ACCESS RIGHT  05/19/2023   IR US GUIDE VASC ACCESS RIGHT  09/15/2023   IR VENO/EXT/UNI LEFT  05/19/2023   IR VENO/EXT/UNI LEFT  07/16/2023   IR VENO/EXT/UNI LEFT  10/22/2023   IR VENOCAVAGRAM IVC  05/19/2023   IR VENOCAVAGRAM IVC  07/16/2023   IR VENOCAVAGRAM IVC   09/15/2023   IR VENOCAVAGRAM IVC  10/22/2023   LAPAROSCOPIC LEFT COLON RESECTION  2015   Colovesical fistula, [place]   Left peroneal tendon repair Left 2023   RADIOLOGY WITH ANESTHESIA N/A 10/22/2023   Procedure: LLE venous thrombectomy;  Surgeon: Bennie Dallas, MD;  Location: [facility] OR;  Service: Radiology;  Laterality: N/A;   WISDOM TOOTH EXTRACTION      Family History  Problem Relation Age of Onset   Heart disease Mother    Diabetes Mother    Hearing loss Mother    Heart disease Father    Prostate cancer Father    Alcohol abuse Father    Cancer Father    COPD Father    Malignant hyperthermia Sister    Pancreatic cancer Maternal Aunt    Diabetes Brother    Cancer Maternal Grandmother    Miscarriages / Stillbirths Maternal Grandmother    Diabetes Brother    Intellectual disability Brother    Learning disabilities Brother    Vision loss Brother    Miscarriages / Stillbirths Sister    Miscarriages / [place] Sister    Colon cancer Neg Hx    Esophageal cancer Neg Hx    Stomach cancer Neg Hx    Liver disease Neg Hx    Breast cancer Neg Hx     Allergies  Allergen Reactions   Metformin And Related Nausea And Vomiting   Aspirin Hives   Ibuprofen Hives   Other     Anesthesia (familial) SISTER HAD MALIGNANT HYPERTHERMIA   Propofol Other (See Comments)    Mother had issues with it    Current Outpatient Medications on File Prior to Visit  Medication Sig Dispense Refill   apixaban (ELIQUIS) 5 MG TABS tablet Take 1 tablet (5 mg total) by mouth 2 (two) times daily.     Blood Glucose Monitoring Suppl DEVI 1 each by Does not apply route in the morning, at noon, and at bedtime. May substitute to any manufacturer covered by patient's insurance. 1 each 0   Calcium Carb-Cholecalciferol 600-12.5 MG-MCG TABS Take 2 tablets by mouth daily.     clobetasol (TEMOVATE) 0.05 % GEL Apply 1 Application topically 2 (two) times daily as needed.     clopidogrel (PLAVIX) 75 MG tablet Take 1  tablet (75 mg total) by mouth daily. 30 tablet 1   folic acid (FOLVITE) 1 MG tablet Take 2 mg by mouth daily.     losartan (COZAAR) 25 MG tablet TAKE 1 TABLET BY MOUTH EVERY DAY FOR BLOOD PRESSURE 90 tablet 3   omeprazole (PRILOSEC) 20 MG capsule Take 20 mg by mouth daily.     clopidogrel (PLAVIX) 75 MG tablet Take 1 tablet (75 mg total) by mouth daily. (Patient not taking: Reported on 11/18/2023) 90 tablet 3   No current facility-administered medications on file prior to visit.    BP 130/82   Pulse 60   Temp (!) 97.4 F (36.3 C) (Temporal)   Ht 5\' 8"  (1.727 m)   Wt 182 lb (82.6 kg)   SpO2 99%   BMI 27.67 kg/m  Objective:   Physical Exam Cardiovascular:     Rate and Rhythm: Normal rate and regular rhythm.  Pulmonary:     Effort: Pulmonary effort is normal.     Breath sounds: Normal breath sounds.  Musculoskeletal:     Cervical back: Neck supple.  Skin:    General: Skin is warm and dry.  Neurological:     Mental Status: She is alert and oriented to person, place, and time.  Psychiatric:        Mood and Affect: Mood normal.           Assessment & Plan:  Type 2 diabetes mellitus with hyperglycemia, without long-term current use of insulin (HCC) Assessment & Plan: Controlled with A1C of 4.6.  Remain off treatment.  Continue lifestyle changes.  Follow up in 3-6 months.  Orders: -     POCT glycosylated hemoglobin (Hb A1C)        [name], NP

## 2023-11-18 NOTE — Patient Instructions (Signed)
Reschedule your physical for late May.  It was a pleasure to see you today!

## 2023-11-18 NOTE — Assessment & Plan Note (Signed)
Controlled with A1C of 4.6.  Remain off treatment.  Continue lifestyle changes.  Follow up in 3-6 months.

## 2023-11-23 ENCOUNTER — Telehealth: Payer: PPO

## 2023-12-28 ENCOUNTER — Encounter: Payer: Self-pay | Admitting: Cardiovascular Disease

## 2023-12-28 NOTE — Progress Notes (Unsigned)
 Cardiology Office Note:    Date:  01/01/2024   ID:  Heather Hudson, DOB [birth year], MRN [id number]  PCP:  [name], NP   [facility] Providers Cardiologist:  Dywane Peruski Click to update primary MD,subspecialty MD or APP then REFRESH:1}    Referring MD: [name], NP   Chief Complaint  Patient presents with   pulmonary embolus    History of Present Illness:    Heather Hudson is a 68 y.o. female with a hx of abnormal ECG We are asked to see her for further evaluation of her abn. ECG from 01/20/23 revealed sinus brady at 51 with NS ST abnl   Seen with her husband , Rob .  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  No syncope   Lipids from April 16 were reviewed LDL is 129 Trigs :  140   Fam hx  Father - CABG in his 39s Mother -  Brother - died at 29 - had DM, PAD, CAD   Going to [place] next week    Originally from [place]    Sept. 30, 2024 Heather Hudson is seen for her dyspnea, abnormal ECG   She was admitted to the hospital on May 14, 2023 with severe shortness of breath and was found to have a submassive pulmonary embolus. Coronary calcium score from April 20, 2023 shows calcium score of 0 which is  low risk  Echocardiogram from May 15, 2023 reveals normal left ventricular systolic function with EF of 60 to 65%. Trivial mitral regurgitation Trivial aortic regurgitation Right ventricular function is mildly reduced. She has moderate pulmonary hypertension with estimated PA pressure of 49.  From what I can tell,  this appears to be an unprovoked PE  She may need to be considered for lifelong anticoagulation   Is on Eliquis   Her left foot is now in a boot    Diastolic BP has been mildly elevated.     December 29, 2023  Heather Hudson is seen  for follow up of her pulmonary embolus . Unprovoked PE .   I have suggested that  Has seen Dr. Georgiann Mohs.  No hypercoagulability issues found yet   Wt is 183 lbs  Trying to improve her diet      Past Medical History:  Diagnosis Date   Adenomatous colon polyp 2015   Allergy 1974   teenager, and 2008   Colovesical fistula 2015   COVID-19 06/2020   Environmental and seasonal allergies    Hx of adenomatous colonic polyps 01/19/2021   Hypertension 11/23   Low dose prescription   Lower abdominal pain 12/23/2019   May-Thurner syndrome    Migraines    Motion sickness    Persistent cough for 3 weeks or longer 10/10/2021    Past Surgical History:  Procedure Laterality Date   COLON SURGERY  2016   fistula, small section removed   COLONOSCOPY  2015   Plus others   IR FEM POP ART STENT INC PTA MOD SED  10/22/2023   IR INTRAVASCULAR ULTRASOUND NON CORONARY  05/19/2023   IR INTRAVASCULAR ULTRASOUND NON CORONARY  07/16/2023   IR IVC FILTER PLMT / S&I /IMG GUID/MOD SED  05/19/2023   IR IVC FILTER RETRIEVAL / S&I Lenise Arena GUID/MOD SED  10/22/2023   IR IVUS EACH ADDITIONAL NON CORONARY VESSEL  09/15/2023   IR PTA VENOUS EXCEPT DIALYSIS CIRCUIT  05/19/2023   IR PTA VENOUS EXCEPT DIALYSIS CIRCUIT  07/16/2023   IR RADIOLOGIST EVAL & MGMT  07/01/2023   IR RADIOLOGIST EVAL & MGMT  08/21/2023   IR RADIOLOGIST EVAL & MGMT  11/13/2023   IR THROMBECT VENO MECH MOD SED  05/19/2023   IR THROMBECT VENO MECH MOD SED  07/16/2023   IR THROMBECT VENO MECH MOD SED  10/22/2023   IR TRANSCATH PLC STENT 1ST ART NOT LE CV CAR VERT CAR  05/19/2023   IR TRANSCATH PLC STENT 1ST ART NOT LE CV CAR VERT CAR  07/16/2023   IR US GUIDE VASC ACCESS LEFT  05/19/2023   IR US GUIDE VASC ACCESS LEFT  07/16/2023   IR US GUIDE VASC ACCESS RIGHT  05/19/2023   IR US GUIDE VASC ACCESS RIGHT  09/15/2023   IR VENO/EXT/UNI LEFT  05/19/2023   IR VENO/EXT/UNI LEFT  07/16/2023   IR VENO/EXT/UNI LEFT  10/22/2023   IR VENOCAVAGRAM IVC  05/19/2023    IR VENOCAVAGRAM IVC  07/16/2023   IR VENOCAVAGRAM IVC  09/15/2023   IR VENOCAVAGRAM IVC  10/22/2023   LAPAROSCOPIC LEFT COLON RESECTION  2015   Colovesical fistula, [place]   Left peroneal tendon repair Left 2023   RADIOLOGY WITH ANESTHESIA N/A 10/22/2023   Procedure: LLE venous thrombectomy;  Surgeon: Bennie Dallas, MD;  Location: [facility] OR;  Service: Radiology;  Laterality: N/A;   WISDOM TOOTH EXTRACTION      Current Medications: Current Meds  Medication Sig   apixaban (ELIQUIS) 5 MG TABS tablet Take 1 tablet (5 mg total) by mouth 2 (two) times daily.   Blood Glucose Monitoring Suppl DEVI 1 each by Does not apply route in the morning, at noon, and at bedtime. May substitute to any manufacturer covered by patient's insurance.   Calcium Carb-Cholecalciferol 600-12.5 MG-MCG TABS Take 2 tablets by mouth daily.   clobetasol (TEMOVATE) 0.05 % GEL Apply 1 Application topically 2 (two) times daily as needed.   clopidogrel (PLAVIX) 75 MG tablet Take 1 tablet (75 mg total) by mouth daily.   folic acid (FOLVITE) 1 MG tablet Take 2 mg by mouth daily.   losartan (COZAAR) 25 MG tablet TAKE 1 TABLET BY MOUTH EVERY DAY FOR BLOOD PRESSURE   omeprazole (PRILOSEC) 20 MG capsule Take 20 mg by mouth daily.     Allergies:   Metformin and related, Aspirin, Ibuprofen, Other, and Propofol   Social History   Socioeconomic History   Marital status: Married    Spouse name: Not on file   Number of children: Not on file   Years of education: Not on file   Highest education level: Associate degree: occupational, Scientist, product/process development, or vocational program  Occupational History   Not on file  Tobacco Use   Smoking status: Never   Smokeless tobacco: Never   Tobacco comments:    Never smoked  Vaping Use   Vaping status: Never Used  Substance and Sexual Activity   Alcohol use: Yes    Alcohol/week: 5.0 standard drinks of alcohol    Types: 5 Glasses of wine per week   Drug use: Never   Sexual activity: Yes     Birth control/protection: Post-menopausal  Other Topics Concern   Not on file  Social History Narrative   Married.  No children.   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   Enjoys wine tasting.    1-2 caffeinated drinks a day 0-1 alcoholic never smoker no drug use   Sister Mardene Celeste works Adult nurse endoscopy as Charity fundraiser   Social Drivers of Corporate investment banker Strain: Low Risk  (10/01/2023)   Overall Financial Resource Strain (CARDIA)    Difficulty of Paying Living Expenses: Not hard at all  Food Insecurity: No Food Insecurity (10/22/2023)   Hunger Vital Sign    Worried About Running Out of Food in the Last Year: Never true    Ran Out of Food in the Last Year: Never true  Transportation Needs: No Transportation Needs (10/22/2023)   PRAPARE - Administrator, Civil Service (Medical): No    Lack of Transportation (Non-Medical): No  Physical Activity: Unknown (10/01/2023)   Exercise Vital Sign    Days of Exercise per Week: 0 days    Minutes of Exercise per Session: Not on file  Stress: No Stress Concern Present (10/01/2023)   [employer] of Occupational Health - Occupational Stress Questionnaire    Feeling of Stress : Not at all  Social Connections: Socially Integrated (10/22/2023)   Social Connection and Isolation Panel [NHANES]    Frequency of Communication with Friends and Family: More than three times a week    Frequency of Social Gatherings with Friends and Family: More than three times a week    Attends Religious Services: More than 4 times per year    Active Member of [employer] or Organizations: Yes    Attends Engineer, structural: More than 4 times per year    Marital Status: Married     Family History: The patient's family history includes Alcohol abuse in her father; COPD in her father;  Cancer in her father and maternal grandmother; Diabetes in her brother, brother, and mother; Hearing loss in her mother; Heart disease in her father and mother; Intellectual disability in her brother; Learning disabilities in her brother; Malignant hyperthermia in her sister; Miscarriages / Stillbirths in her maternal grandmother, sister, and sister; Pancreatic cancer in her maternal aunt; Prostate cancer in her father; Vision loss in her brother. There is no history of Colon cancer, Esophageal cancer, Stomach cancer, Liver disease, or Breast cancer.  ROS:   Please see the history of present illness.     All other systems reviewed and are negative.  EKGs/Labs/Other Studies Reviewed:    The following studies were reviewed today:   EKG:          Recent Labs: 05/05/2023: TSH 2.38 05/14/2023: B Natriuretic Peptide 37.4 05/15/2023: ALT 39 05/22/2023: Magnesium 1.9 10/23/2023: BUN 14; Creatinine, Ser 0.64; Hemoglobin 10.8; Platelets 153; Potassium 4.0; Sodium 138  Recent Lipid Panel    Component Value Date/Time   CHOL 223 (H) 01/20/2023 0830   TRIG 140.0 01/20/2023 0830   HDL 66.10 01/20/2023 0830   CHOLHDL 3 01/20/2023 0830   VLDL 28.0 01/20/2023 0830   LDLCALC 129 (H) 01/20/2023 0830     Risk Assessment/Calculations:       Physical Exam:     Physical Exam: Blood pressure 132/88, pulse (!) 57, height 5\' 8"  (1.727 m), weight 183 lb 12.8 oz (83.4 kg), SpO2 97%.       GEN:  Well nourished, well developed in no acute distress HEENT: Normal NECK: No JVD; No carotid bruits LYMPHATICS:  No lymphadenopathy CARDIAC: RRR , no murmurs, rubs, gallops RESPIRATORY:  Clear to auscultation without rales, wheezing or rhonchi  ABDOMEN: Soft, non-tender, non-distended MUSCULOSKELETAL:  No edema; No deformity  SKIN: Warm and dry NEUROLOGIC:  Alert and oriented x 3    ASSESSMENT:    1. Other acute pulmonary embolism without acute cor pulmonale (HCC)      PLAN:       Pulmonary  Embolius:  better ,    From what I can tell I would call this an unprovoked pulmonary embolus.   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  Echo in Aug. 2024 showed mildly reduced RV function with moderate pulmonary HTN  Continue eliquis       2.  Hyperlipidemia:    cont meds.    3.  HTN:    fairly well controlled.   Cont meds.      Medication Adjustments/Labs and Tests Ordered: Current medicines are reviewed at length with the patient today.  Concerns regarding medicines are outlined above.  No orders of the defined types were placed in this encounter.  No orders of the defined types were placed in this encounter.   Patient Instructions  Follow-Up: At [facility], you and your health needs are our priority.  As part of our continuing mission to provide you with exceptional heart care, our providers are all part of one team.  This team includes your primary Cardiologist (physician) and Advanced Practice Providers or APPs (Physician Assistants and Nurse Practitioners) who all work together to provide you with the care you need, when you need it.  Your next appointment:   1 year(s)  Provider:   Kristeen Miss, MD  We recommend signing up for the patient portal called "MyChart".  Sign up information is provided on this After Visit Summary.  MyChart is used to connect with patients for Virtual Visits (Telemedicine).  Patients are able to view lab/test results, encounter notes, upcoming appointments, etc.  Non-urgent messages can be sent to your provider as well.   To learn more about what you can do with MyChart, go to [email or web address].    1st Floor: - Lobby - Registration  - Pharmacy  - Lab - Cafe  2nd Floor: - PV Lab - Diagnostic Testing (echo, CT, nuclear med)  3rd Floor: - Vacant  4th Floor: - TCTS (cardiothoracic surgery) - AFib Clinic - Structural Heart Clinic - Vascular Surgery  - Vascular Ultrasound  5th Floor: - HeartCare Cardiology (general  and EP) - Clinical Pharmacy for coumadin, hypertension, lipid, weight-loss medications, and med management appointments      Signed, Kristeen Miss, MD  01/01/2024 9:57 AM    [facility]

## 2024-01-01 ENCOUNTER — Ambulatory Visit: Payer: PPO | Attending: Internal Medicine | Admitting: Cardiovascular Disease

## 2024-01-01 ENCOUNTER — Encounter: Payer: Self-pay | Admitting: Cardiovascular Disease

## 2024-01-01 VITALS — BP 132/88 | HR 57 | Ht 68.0 in | Wt 183.8 lb

## 2024-01-01 DIAGNOSIS — I2699 Other pulmonary embolism without acute cor pulmonale: Secondary | ICD-10-CM | POA: Diagnosis not present

## 2024-01-01 NOTE — Patient Instructions (Signed)
 Follow-Up: At [facility], you and your health needs are our priority.  As part of our continuing mission to provide you with exceptional heart care, our providers are all part of one team.  This team includes your primary Cardiologist (physician) and Advanced Practice Providers or APPs (Physician Assistants and Nurse Practitioners) who all work together to provide you with the care you need, when you need it.  Your next appointment:   1 year(s)  Provider:   Kristeen Miss, MD  We recommend signing up for the patient portal called "MyChart".  Sign up information is provided on this After Visit Summary.  MyChart is used to connect with patients for Virtual Visits (Telemedicine).  Patients are able to view lab/test results, encounter notes, upcoming appointments, etc.  Non-urgent messages can be sent to your provider as well.   To learn more about what you can do with MyChart, go to [email or web address].    1st Floor: - Lobby - Registration  - Pharmacy  - Lab - Cafe  2nd Floor: - PV Lab - Diagnostic Testing (echo, CT, nuclear med)  3rd Floor: - Vacant  4th Floor: - TCTS (cardiothoracic surgery) - AFib Clinic - Structural Heart Clinic - Vascular Surgery  - Vascular Ultrasound  5th Floor: - HeartCare Cardiology (general and EP) - Clinical Pharmacy for coumadin, hypertension, lipid, weight-loss medications, and med management appointments

## 2024-01-07 ENCOUNTER — Other Ambulatory Visit: Payer: Self-pay | Admitting: Interventional Radiology

## 2024-01-07 DIAGNOSIS — Z86718 Personal history of other venous thrombosis and embolism: Secondary | ICD-10-CM

## 2024-01-07 DIAGNOSIS — Z86711 Personal history of pulmonary embolism: Secondary | ICD-10-CM

## 2024-01-10 ENCOUNTER — Other Ambulatory Visit: Payer: Self-pay | Admitting: Primary Care

## 2024-01-10 DIAGNOSIS — I10 Essential (primary) hypertension: Secondary | ICD-10-CM

## 2024-01-19 ENCOUNTER — Other Ambulatory Visit (HOSPITAL_COMMUNITY): Payer: Self-pay

## 2024-01-21 ENCOUNTER — Ambulatory Visit: Payer: PPO | Admitting: Primary Care

## 2024-01-21 ENCOUNTER — Ambulatory Visit: Payer: PPO

## 2024-02-08 ENCOUNTER — Ambulatory Visit
Admission: RE | Admit: 2024-02-08 | Discharge: 2024-02-08 | Disposition: A | Discharge status: Home / Self Care | Source: Ambulatory Visit | Attending: Interventional Radiology

## 2024-02-08 ENCOUNTER — Inpatient Hospital Stay: Admission: RE | Admit: 2024-02-08 | Source: Ambulatory Visit

## 2024-02-08 ENCOUNTER — Ambulatory Visit
Admission: RE | Admit: 2024-02-08 | Discharge: 2024-02-08 | Disposition: A | Discharge status: Home / Self Care | Source: Ambulatory Visit | Attending: Interventional Radiology | Admitting: Interventional Radiology

## 2024-02-08 DIAGNOSIS — Z86711 Personal history of pulmonary embolism: Secondary | ICD-10-CM

## 2024-02-08 DIAGNOSIS — Z86718 Personal history of other venous thrombosis and embolism: Secondary | ICD-10-CM

## 2024-02-08 MED ORDER — IOPAMIDOL (ISOVUE-370) INJECTION 76%
100.0000 mL | Freq: Once | INTRAVENOUS | Status: AC | PRN
Start: 2024-02-08 — End: 2024-02-08
  Administered 2024-02-08: 100 mL via INTRAVENOUS

## 2024-02-19 NOTE — Progress Notes (Signed)
 This encounter was conducted via the [employer] providing interactive audio and visual communication. The patient provided verbal consent to conduct a virtual appointment. The patient was located at their primary residence during this encounter.  Referring Physician(s): Dr. Fonnie Iba, Dr. Cameron Cea   Chief Complaint: The patient is seen in virtual video follow up today s/p left femoral iliac stenting with revisions 10/23/23  History of present illness: HPI from last clinic visit 11/13/23 68 year old female with history of May Thurner Syndrome and acute left lower extremity DVT and PE in August 2024 which was treated with anticoagulation and single section LLE aspiration thrombectomy and iliac stent placement in addition to filter placement.  She unfortunately experienced in-stent thrombus and therefore the stents were recanalized and revised, extending into the left common femoral vein on 10/22/23.  The IVC filter was removed at this time. She was started on Lovenox  and discharged home the next day. She has been doing well since the procedure but has experienced shortness of breath. She continues to work with PT due to her left ankle. She has remained compliant with Lovenox  and runs out next week. She states that her left lower extremity feels great and is symmetric in size to the right lower extremity. No pain or swelling.   Imaging at the time of our last follow up showed stent patency and we discussed her anticoagulation regimen including completing her lovenox  therapy, converting back to Eliquis  and continuing Plavix  every day. She was encouraged to use a LLE compression hose and we made plans for repeat imaging and a clinic visit in 3 months.  CT chest and CT venogram abdomen/pelvis were completed 02/08/24. She presents today via virtual video visit for follow up. She endorses increased left leg swelling and cramping in her calf and ankle.  This has been mildly progressive over  the past month.  No shortness of breath.  She has been compliant with Eliquis  and Plavix .  Past Medical History:  Diagnosis Date   Adenomatous colon polyp 2015   Allergy 1974   teenager, and 2008   Colovesical fistula 2015   COVID-19 06/2020   Environmental and seasonal allergies    Hx of adenomatous colonic polyps 01/19/2021   Hypertension 11/23   Low dose prescription   Lower abdominal pain 12/23/2019   May-Thurner syndrome    Migraines    Motion sickness    Persistent cough for 3 weeks or longer 10/10/2021    Past Surgical History:  Procedure Laterality Date   COLON SURGERY  2016   fistula, small section removed   COLONOSCOPY  2015   Plus others   IR FEM POP ART STENT INC PTA MOD SED  10/22/2023   IR INTRAVASCULAR ULTRASOUND NON CORONARY  05/19/2023   IR INTRAVASCULAR ULTRASOUND NON CORONARY  07/16/2023   IR IVC FILTER PLMT / S&I /IMG GUID/MOD SED  05/19/2023   IR IVC FILTER RETRIEVAL / S&I /IMG GUID/MOD SED  10/22/2023   IR IVUS EACH ADDITIONAL NON CORONARY VESSEL  09/15/2023   IR PTA VENOUS EXCEPT DIALYSIS CIRCUIT  05/19/2023   IR PTA VENOUS EXCEPT DIALYSIS CIRCUIT  07/16/2023   IR RADIOLOGIST EVAL & MGMT  07/01/2023   IR RADIOLOGIST EVAL & MGMT  08/21/2023   IR RADIOLOGIST EVAL & MGMT  11/13/2023   IR THROMBECT VENO MECH MOD SED  05/19/2023   IR THROMBECT VENO MECH MOD SED  07/16/2023   IR THROMBECT VENO MECH MOD SED  10/22/2023   IR TRANSCATH  PLC STENT 1ST ART NOT LE CV CAR VERT CAR  05/19/2023   IR TRANSCATH PLC STENT 1ST ART NOT LE CV CAR VERT CAR  07/16/2023   IR US  GUIDE VASC ACCESS LEFT  05/19/2023   IR US  GUIDE VASC ACCESS LEFT  07/16/2023   IR US  GUIDE VASC ACCESS RIGHT  05/19/2023   IR US  GUIDE VASC ACCESS RIGHT  09/15/2023   IR VENO/EXT/UNI LEFT  05/19/2023   IR VENO/EXT/UNI LEFT  07/16/2023   IR VENO/EXT/UNI LEFT  10/22/2023   IR VENOCAVAGRAM IVC  05/19/2023   IR VENOCAVAGRAM IVC  07/16/2023   IR VENOCAVAGRAM IVC  09/15/2023   IR VENOCAVAGRAM IVC  10/22/2023    LAPAROSCOPIC LEFT COLON RESECTION  2015   Colovesical fistula, [place]   Left peroneal tendon repair Left 2023   RADIOLOGY WITH ANESTHESIA N/A 10/22/2023   Procedure: LLE venous thrombectomy;  Surgeon: Federico Hopkins, MD;  Location: [facility] OR;  Service: Radiology;  Laterality: N/A;   WISDOM TOOTH EXTRACTION      Allergies: Metformin  and related, Aspirin, Ibuprofen, Other, and Propofol   Medications: Prior to Admission medications   Medication Sig Start Date End Date Taking? Authorizing Provider  apixaban  (ELIQUIS ) 5 MG TABS tablet Take 1 tablet (5 mg total) by mouth 2 (two) times daily. 11/18/23   Gudena, Vinay, MD  Blood Glucose Monitoring Suppl DEVI 1 each by Does not apply route in the morning, at noon, and at bedtime. May substitute to any manufacturer covered by patient's insurance. 07/27/23   Gabriel John, NP  Calcium  Carb-Cholecalciferol 600-12.5 MG-MCG TABS Take 2 tablets by mouth daily.    [provider]  clobetasol  (TEMOVATE ) 0.05 % GEL Apply 1 Application topically 2 (two) times daily as needed. 06/18/23   [provider]  clopidogrel  (PLAVIX ) 75 MG tablet Take 1 tablet (75 mg total) by mouth daily. 07/16/23 07/10/24  Covington, Jamie R, NP  folic acid  (FOLVITE ) 1 MG tablet Take 2 mg by mouth daily. 09/14/23   [provider]  losartan  (COZAAR ) 25 MG tablet TAKE 1 TABLET BY MOUTH EVERY DAY FOR BLOOD PRESSURE 01/10/24   Clark, Katherine K, NP  omeprazole (PRILOSEC) 20 MG capsule Take 20 mg by mouth daily.    [provider]     Family History  Problem Relation Age of Onset   Heart disease Mother    Diabetes Mother    Hearing loss Mother    Heart disease Father    Prostate cancer Father    Alcohol abuse Father    Cancer Father    COPD Father    Malignant hyperthermia Sister    Pancreatic cancer Maternal Aunt    Diabetes Brother    Cancer Maternal Grandmother    Miscarriages / Stillbirths Maternal Grandmother    Diabetes Brother     Intellectual disability Brother    Learning disabilities Brother    Vision loss Brother    Miscarriages / Stillbirths Sister    Miscarriages / [place] Sister    Colon cancer Neg Hx    Esophageal cancer Neg Hx    Stomach cancer Neg Hx    Liver disease Neg Hx    Breast cancer Neg Hx     Social History   Socioeconomic History   Marital status: Married    Spouse name: Not on file   Number of children: Not on file   Years of education: Not on file   Highest education level: Associate degree: occupational, Scientist, product/process development, or vocational program  Occupational History   Not on file  Tobacco Use   Smoking status: Never   Smokeless tobacco: Never   Tobacco comments:    Never smoked  Vaping Use   Vaping status: Never Used  Substance and Sexual Activity   Alcohol use: Yes    Alcohol/week: 5.0 standard drinks of alcohol    Types: 5 Glasses of wine per week   Drug use: Never   Sexual activity: Yes    Birth control/protection: Post-menopausal  Other Topics Concern   Not on file  Social History Narrative   Married.  No children.   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   Enjoys wine tasting.    1-2 caffeinated drinks a day 0-1 alcoholic never smoker no drug use   Sister Kingsley Penny works Adult nurse endoscopy as Charity fundraiser   Social Drivers of Corporate investment banker Strain: Low Risk  (10/01/2023)   Overall Financial Resource Strain (CARDIA)    Difficulty of Paying Living Expenses: Not hard at all  Food Insecurity: No Food Insecurity (10/22/2023)   Hunger Vital Sign    Worried About Running Out of Food in the Last Year: Never true    Ran Out of Food in the Last Year: Never true  Transportation Needs: No Transportation Needs (10/22/2023)   PRAPARE - Administrator, Civil Service (Medical): No    Lack of  Transportation (Non-Medical): No  Physical Activity: Unknown (10/01/2023)   Exercise Vital Sign    Days of Exercise per Week: 0 days    Minutes of Exercise per Session: Not on file  Stress: No Stress Concern Present (10/01/2023)   [employer] of Occupational Health - Occupational Stress Questionnaire    Feeling of Stress : Not at all  Social Connections: Socially Integrated (10/22/2023)   Social Connection and Isolation Panel [NHANES]    Frequency of Communication with Friends and Family: More than three times a week    Frequency of Social Gatherings with Friends and Family: More than three times a week    Attends Religious Services: More than 4 times per year    Active Member of [employer] or Organizations: Yes    Attends Engineer, structural: More than 4 times per year    Marital Status: Married     Vital Signs: There were no vitals taken for this visit.  Physical Exam  Patient is alert, oriented and able to participate fully in the conversation. No apparent discomfort or distress observed. She appears appropriately dressed.   Imaging: CT Chest 11/04/23  Evolving pulmonary infarct, sequela of prior PE   CT AP 11/04/23  Patent left femoral - iliac stents.  Small focal wall-adherent non-occlusive IVC thrombus persists  CTV 02/08/24 Recurrent wall-adherent, non-occlusive thrombus within the genu of the iliac stent system.  Poor venous enhancement bolus cap0ture.  Labs:  CBC: Recent Labs    05/21/23 0643 07/16/23 0927 10/22/23 0900 10/23/23 0653  WBC 6.1 6.2 4.3 5.7  HGB 8.7* 12.7 13.1 10.8*  HCT 25.5* 38.2 38.9 31.6*  PLT 265 190 155 153    COAGS: Recent Labs    05/14/23 1930 05/15/23 0146 07/16/23 0927 10/22/23 0900  INR 1.1 1.1 1.1 1.3*  APTT 32 65*  --   --     BMP: Recent Labs  07/16/23 0927 09/15/23 0709 10/22/23 0900 10/23/23 0653  NA 142 138 138 138  K 3.5 3.6 4.0 4.0  CL 112* 112* 110 109  CO2 20* 19* 19* 21*  GLUCOSE 94 110*  105* 121*  BUN 9 9 17 14   CALCIUM  10.3 9.8 10.1 10.3  CREATININE 0.71 0.69 0.62 0.64  GFRNONAA >60 >60 >60 >60    LIVER FUNCTION TESTS: Recent Labs    05/14/23 1749 05/15/23 0146 05/18/23 1129 05/20/23 1245 05/21/23 0645 05/22/23 1104  BILITOT 1.5* 1.3*  --   --   --   --   AST 26 21  --   --   --   --   ALT 42 39  --   --   --   --   ALKPHOS 74 74  --   --   --   --   PROT 6.5 5.9*  --   --   --   --   ALBUMIN 3.7 3.0* 2.6* 2.6* 2.4* 2.6*    Assessment and Plan: 68 year old female with history of May Thurner syndrome with VTE now status post left femoral --> iliac stenting with revisions 10/23/23. Most recent imaging demonstrates non-occlusive recurrent thrombus within the iliac stent system with associated left lower extremity swelling despite anticoagulation/antiplatelet compliance.    -plan for stent thrombectomy/revision with [facility]esthesia at [facility] -continue Eliquis  and Plavix  for now - I will reach out to Dr. Lee Public for consideration of alternative therapy -agree with continued exercise, activity, and compression stocking use  Creasie Doctor, MD Pager: [phone]    I spent a total of 25 Minutes in virtual video clinical consultation, greater than 50% of which was counseling/coordinating care for May-Thurner Syndrome

## 2024-02-22 ENCOUNTER — Ambulatory Visit
Admission: RE | Admit: 2024-02-22 | Discharge: 2024-02-22 | Disposition: A | Discharge status: Home / Self Care | Source: Ambulatory Visit | Attending: Interventional Radiology | Admitting: Interventional Radiology

## 2024-02-22 DIAGNOSIS — Z86711 Personal history of pulmonary embolism: Secondary | ICD-10-CM

## 2024-02-22 DIAGNOSIS — Z86718 Personal history of other venous thrombosis and embolism: Secondary | ICD-10-CM

## 2024-02-22 HISTORY — PX: IR RADIOLOGIST EVAL & MGMT: IMG5224

## 2024-02-25 ENCOUNTER — Telehealth: Payer: Self-pay | Admitting: *Deleted

## 2024-02-25 NOTE — Telephone Encounter (Signed)
 Called to make pt aware that Dr. Lee Public wanted to f/u and discuss lovenox  or coumadin options. Left vm for pt to call and reschedule scheduled appt for 6/4 with Dr. Gudena if that option does not work.

## 2024-03-01 ENCOUNTER — Encounter: Payer: Self-pay | Admitting: Primary Care

## 2024-03-01 ENCOUNTER — Ambulatory Visit: Payer: Self-pay | Admitting: Primary Care

## 2024-03-01 ENCOUNTER — Ambulatory Visit (INDEPENDENT_AMBULATORY_CARE_PROVIDER_SITE_OTHER): Payer: PPO | Admitting: Primary Care

## 2024-03-01 VITALS — BP 118/82 | HR 66 | Temp 97.2°F | Ht 68.0 in | Wt 187.0 lb

## 2024-03-01 DIAGNOSIS — I10 Essential (primary) hypertension: Secondary | ICD-10-CM | POA: Diagnosis not present

## 2024-03-01 DIAGNOSIS — E785 Hyperlipidemia, unspecified: Secondary | ICD-10-CM | POA: Diagnosis not present

## 2024-03-01 DIAGNOSIS — Z0000 Encounter for general adult medical examination without abnormal findings: Secondary | ICD-10-CM

## 2024-03-01 DIAGNOSIS — I871 Compression of vein: Secondary | ICD-10-CM | POA: Diagnosis not present

## 2024-03-01 DIAGNOSIS — Z1231 Encounter for screening mammogram for malignant neoplasm of breast: Secondary | ICD-10-CM

## 2024-03-01 DIAGNOSIS — E1165 Type 2 diabetes mellitus with hyperglycemia: Secondary | ICD-10-CM

## 2024-03-01 DIAGNOSIS — R0609 Other forms of dyspnea: Secondary | ICD-10-CM

## 2024-03-01 LAB — HEPATIC FUNCTION PANEL
ALT: 33 U/L (ref 0–35)
AST: 25 U/L (ref 0–37)
Albumin: 4.5 g/dL (ref 3.5–5.2)
Alkaline Phosphatase: 107 U/L (ref 39–117)
Bilirubin, Direct: 0.1 mg/dL (ref 0.0–0.3)
Total Bilirubin: 0.9 mg/dL (ref 0.2–1.2)
Total Protein: 6.6 g/dL (ref 6.0–8.3)

## 2024-03-01 LAB — LIPID PANEL
Cholesterol: 213 mg/dL — ABNORMAL HIGH (ref 0–200)
HDL: 50 mg/dL (ref >39.00)
LDL Cholesterol: 129 mg/dL — ABNORMAL HIGH (ref 0–99)
NonHDL: 162.82
Total CHOL/HDL Ratio: 4
Triglycerides: 170 mg/dL — ABNORMAL HIGH (ref 0.0–149.0)
VLDL: 34 mg/dL (ref 0.0–40.0)

## 2024-03-01 LAB — HEMOGLOBIN A1C: Hgb A1c MFr Bld: 5.2 % (ref 4.6–6.5)

## 2024-03-01 NOTE — Assessment & Plan Note (Signed)
 Repeat lipid panel today.  Remain off treatment.

## 2024-03-01 NOTE — Assessment & Plan Note (Signed)
 Stable.  Continue Eliquis  5 mg twice daily and clopidogrel  75 mg daily.

## 2024-03-01 NOTE — Progress Notes (Signed)
 Subjective:    Patient ID: Heather Hudson, female    DOB: 08-25-1956, 68 y.o.   MRN: [id number]  HPI  Heather Hudson is a very pleasant 68 y.o. female who presents today for complete physical and follow up of chronic conditions.  Immunizations: -Tetanus: Completed in 2015  -Shingles: Never completed  -Pneumonia: Never completed   Diet: Fair diet.  Exercise: No regular exercise.  Eye exam: Completes annually  Dental exam: Completes semi-annually    Mammogram: Completed in May 2024 Bone Density Scan: Completed in October 2024  Colonoscopy: Completed in 2022, due 2029  BP Readings from Last 3 Encounters:  03/01/24 118/82  01/01/24 132/88  11/18/23 130/82       Review of Systems  Constitutional:  Negative for unexpected weight change.  HENT:  Negative for rhinorrhea.   Respiratory:  Positive for shortness of breath. Negative for cough.   Cardiovascular:  Positive for leg swelling. Negative for chest pain.  Gastrointestinal:  Negative for constipation and diarrhea.  Genitourinary:  Negative for difficulty urinating.  Musculoskeletal:  Negative for arthralgias and myalgias.  Skin:  Negative for rash.  Allergic/Immunologic: Negative for environmental allergies.  Neurological:  Negative for dizziness and headaches.         Past Medical History:  Diagnosis Date   Acute pulmonary embolism (HCC) 05/14/2023   Adenomatous colon polyp 2015   Allergy 1974   teenager, and 2008   Colovesical fistula 2015   COVID-19 06/2020   Environmental and seasonal allergies    Hx of adenomatous colonic polyps 01/19/2021   Hypertension 11/23   Low dose prescription   Lower abdominal pain 12/23/2019   Mass of soft tissue of abdomen 08/18/2023   May-Thurner syndrome    Migraines    Motion sickness    Persistent cough for 3 weeks or longer 10/10/2021   Pulmonary embolism (HCC) 05/14/2023    Social History   Socioeconomic History   Marital status: Married    Spouse name: Not on  file   Number of children: Not on file   Years of education: Not on file   Highest education level: Associate degree: occupational, Scientist, product/process development, or vocational program  Occupational History   Not on file  Tobacco Use   Smoking status: Never   Smokeless tobacco: Never   Tobacco comments:    Never smoked  Vaping Use   Vaping status: Never Used  Substance and Sexual Activity   Alcohol use: Yes    Alcohol/week: 5.0 standard drinks of alcohol    Types: 5 Glasses of wine per week   Drug use: Never   Sexual activity: Yes    Birth control/protection: Post-menopausal  Other Topics Concern   Not on file  Social History Narrative   Married.  No children.   Moved from [place] area of [place]  in 2018    Worked in [employer], Insurance account manager.  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   Enjoys wine tasting.    1-2 caffeinated drinks a day 0-1 alcoholic never smoker no drug use   Sister Kingsley Penny works Adult nurse endoscopy as Charity fundraiser   Social Drivers of Corporate investment banker Strain: Low Risk  (10/01/2023)   Overall Financial Resource Strain (CARDIA)    Difficulty of Paying Living Expenses: Not hard at all  Food Insecurity: No Food Insecurity (10/22/2023)   Hunger Vital Sign    Worried About Programme researcher, broadcasting/film/video in  the Last Year: Never true    Ran Out of Food in the Last Year: Never true  Transportation Needs: No Transportation Needs (10/22/2023)   PRAPARE - Administrator, Civil Service (Medical): No    Lack of Transportation (Non-Medical): No  Physical Activity: Unknown (10/01/2023)   Exercise Vital Sign    Days of Exercise per Week: 0 days    Minutes of Exercise per Session: Not on file  Stress: No Stress Concern Present (10/01/2023)   [employer] of Occupational Health - Occupational Stress Questionnaire    Feeling of Stress : Not at all  Social Connections: Socially Integrated (10/22/2023)    Social Connection and Isolation Panel [NHANES]    Frequency of Communication with Friends and Family: More than three times a week    Frequency of Social Gatherings with Friends and Family: More than three times a week    Attends Religious Services: More than 4 times per year    Active Member of Clubs or Organizations: Yes    Attends Engineer, structural: More than 4 times per year    Marital Status: Married  Catering manager Violence: Not At Risk (10/22/2023)   Humiliation, Afraid, Rape, and Kick questionnaire    Fear of Current or Ex-Partner: No    Emotionally Abused: No    Physically Abused: No    Sexually Abused: No    Past Surgical History:  Procedure Laterality Date   COLON SURGERY  2016   fistula, small section removed   COLONOSCOPY  2015   Plus others   IR FEM POP ART STENT INC PTA MOD SED  10/22/2023   IR INTRAVASCULAR ULTRASOUND NON CORONARY  05/19/2023   IR INTRAVASCULAR ULTRASOUND NON CORONARY  07/16/2023   IR IVC FILTER PLMT / S&I /IMG GUID/MOD SED  05/19/2023   IR IVC FILTER RETRIEVAL / S&I /IMG GUID/MOD SED  10/22/2023   IR IVUS EACH ADDITIONAL NON CORONARY VESSEL  09/15/2023   IR PTA VENOUS EXCEPT DIALYSIS CIRCUIT  05/19/2023   IR PTA VENOUS EXCEPT DIALYSIS CIRCUIT  07/16/2023   IR RADIOLOGIST EVAL & MGMT  07/01/2023   IR RADIOLOGIST EVAL & MGMT  08/21/2023   IR RADIOLOGIST EVAL & MGMT  11/13/2023   IR RADIOLOGIST EVAL & MGMT  02/22/2024   IR THROMBECT VENO MECH MOD SED  05/19/2023   IR THROMBECT VENO MECH MOD SED  07/16/2023   IR THROMBECT VENO MECH MOD SED  10/22/2023   IR TRANSCATH PLC STENT 1ST ART NOT LE CV CAR VERT CAR  05/19/2023   IR TRANSCATH PLC STENT 1ST ART NOT LE CV CAR VERT CAR  07/16/2023   IR US  GUIDE VASC ACCESS LEFT  05/19/2023   IR US  GUIDE VASC ACCESS LEFT  07/16/2023   IR US  GUIDE VASC ACCESS RIGHT  05/19/2023   IR US  GUIDE VASC ACCESS RIGHT  09/15/2023   IR VENO/EXT/UNI LEFT  05/19/2023   IR VENO/EXT/UNI LEFT  07/16/2023   IR VENO/EXT/UNI  LEFT  10/22/2023   IR VENOCAVAGRAM IVC  05/19/2023   IR VENOCAVAGRAM IVC  07/16/2023   IR VENOCAVAGRAM IVC  09/15/2023   IR VENOCAVAGRAM IVC  10/22/2023   LAPAROSCOPIC LEFT COLON RESECTION  2015   Colovesical fistula, [place]   Left peroneal tendon repair Left 2023   RADIOLOGY WITH ANESTHESIA N/A 10/22/2023   Procedure: LLE venous thrombectomy;  Surgeon: Federico Hopkins, MD;  Location: [facility] OR;  Service: Radiology;  Laterality: N/A;   WISDOM TOOTH EXTRACTION  Family History  Problem Relation Age of Onset   Heart disease Mother    Diabetes Mother    Hearing loss Mother    Heart disease Father    Prostate cancer Father    Alcohol abuse Father    Cancer Father    COPD Father    Malignant hyperthermia Sister    Pancreatic cancer Maternal Aunt    Diabetes Brother    Cancer Maternal Grandmother    Miscarriages / Stillbirths Maternal Grandmother    Diabetes Brother    Intellectual disability Brother    Learning disabilities Brother    Vision loss Brother    Miscarriages / Stillbirths Sister    Miscarriages / [place] Sister    Colon cancer Neg Hx    Esophageal cancer Neg Hx    Stomach cancer Neg Hx    Liver disease Neg Hx    Breast cancer Neg Hx     Allergies  Allergen Reactions   Metformin  And Related Nausea And Vomiting   Aspirin Hives   Ibuprofen Hives   Other     Anesthesia (familial) SISTER HAD MALIGNANT HYPERTHERMIA   Propofol  Other (See Comments)    Mother had issues with it    Current Outpatient Medications on File Prior to Visit  Medication Sig Dispense Refill   apixaban  (ELIQUIS ) 5 MG TABS tablet Take 1 tablet (5 mg total) by mouth 2 (two) times daily. 60 tablet 12   Blood Glucose Monitoring Suppl DEVI 1 each by Does not apply route in the morning, at noon, and at bedtime. May substitute to any manufacturer covered by patient's insurance. 1 each 0   Calcium  Carb-Cholecalciferol 600-12.5 MG-MCG TABS Take 2 tablets by mouth daily.     clopidogrel  (PLAVIX )  75 MG tablet Take 1 tablet (75 mg total) by mouth daily. 90 tablet 3   folic acid  (FOLVITE ) 1 MG tablet Take 2 mg by mouth daily.     losartan  (COZAAR ) 25 MG tablet TAKE 1 TABLET BY MOUTH EVERY DAY FOR BLOOD PRESSURE 90 tablet 0   omeprazole (PRILOSEC) 20 MG capsule Take 20 mg by mouth daily.     clobetasol  (TEMOVATE ) 0.05 % GEL Apply 1 Application topically 2 (two) times daily as needed. (Patient not taking: Reported on 03/01/2024)     No current facility-administered medications on file prior to visit.    BP 118/82   Pulse 66   Temp (!) 97.2 F (36.2 C) (Temporal)   Ht 5\' 8"  (1.727 m)   Wt 187 lb (84.8 kg)   SpO2 98%   BMI 28.43 kg/m  Objective:   Physical Exam HENT:     Right Ear: Tympanic membrane and ear canal normal.     Left Ear: Tympanic membrane and ear canal normal.  Eyes:     Pupils: Pupils are equal, round, and reactive to light.  Cardiovascular:     Rate and Rhythm: Normal rate and regular rhythm.     Comments: Mild to moderate left lower extremity edema from mid calf down through toes. Pulmonary:     Effort: Pulmonary effort is normal.     Breath sounds: Normal breath sounds.  Abdominal:     General: Bowel sounds are normal.     Palpations: Abdomen is soft.     Tenderness: There is no abdominal tenderness.  Musculoskeletal:        General: Normal range of motion.     Cervical back: Neck supple.  Skin:    General: Skin is warm  and dry.  Neurological:     Mental Status: She is alert and oriented to person, place, and time.     Cranial Nerves: No cranial nerve deficit.     Deep Tendon Reflexes:     Reflex Scores:      Patellar reflexes are 2+ on the right side and 2+ on the left side. Psychiatric:        Mood and Affect: Mood normal.           Assessment & Plan:  Primary hypertension Assessment & Plan: Controlled.  Continue losartan  25 mg daily. BMP reviewed from January 2025.   May-Thurner syndrome Assessment & Plan: Following with vascular  services and hematology.  Office notes reviewed from May 2025 from vascular services.   Continue Eliquis  5 mg BID, Plavix  75 mg daily.     Screening mammogram for breast cancer -     3D Screening Mammogram, Left and Right; Future  Hyperlipidemia, unspecified hyperlipidemia type Assessment & Plan: Repeat lipid panel today.  Remain off treatment.  Orders: -     Lipid panel -     Hepatic function panel  Type 2 diabetes mellitus with hyperglycemia, without long-term current use of insulin  (HCC) Assessment & Plan: Repeat A1c pending. Remain off treatment.  Declines pneumonia vaccine. Urine microalbumin up-to-date.  Orders: -     Hemoglobin A1c  Exertional dyspnea Assessment & Plan: Stable.  Continue Eliquis  5 mg twice daily and clopidogrel  75 mg daily.   Preventative health care Assessment & Plan: Declines Shingrix vaccines and pneumonia vaccine Bone density scan up-to-date Mammogram due, orders placed. Colonoscopy UTD, due 2029  Discussed the importance of a healthy diet and regular exercise in order for weight loss, and to reduce the risk of further co-morbidity.  Exam stable. Labs pending.  Follow up in 1 year for repeat physical.          Caoimhe Damron K Daryle Boyington, NP

## 2024-03-01 NOTE — Assessment & Plan Note (Signed)
 Controlled.  Continue losartan  25 mg daily. BMP reviewed from January 2025.

## 2024-03-01 NOTE — Assessment & Plan Note (Signed)
 Repeat A1c pending. Remain off treatment.  Declines pneumonia vaccine. Urine microalbumin up-to-date.

## 2024-03-01 NOTE — Assessment & Plan Note (Signed)
 Following with vascular services and hematology.  Office notes reviewed from May 2025 from vascular services.   Continue Eliquis  5 mg BID, Plavix  75 mg daily.

## 2024-03-01 NOTE — Assessment & Plan Note (Signed)
 Declines Shingrix vaccines and pneumonia vaccine Bone density scan up-to-date Mammogram due, orders placed. Colonoscopy UTD, due 2029  Discussed the importance of a healthy diet and regular exercise in order for weight loss, and to reduce the risk of further co-morbidity.  Exam stable. Labs pending.  Follow up in 1 year for repeat physical.

## 2024-03-01 NOTE — Patient Instructions (Signed)
 Stop by the lab prior to leaving today. I will notify you of your results once received.   [facility] to schedule your mamm[facility].   It was a pleasure to see you today!

## 2024-03-02 ENCOUNTER — Other Ambulatory Visit (HOSPITAL_COMMUNITY): Payer: Self-pay | Admitting: Interventional Radiology

## 2024-03-02 DIAGNOSIS — I871 Compression of vein: Secondary | ICD-10-CM

## 2024-03-08 ENCOUNTER — Telehealth (HOSPITAL_COMMUNITY): Payer: Self-pay | Admitting: Interventional Radiology

## 2024-03-08 NOTE — Telephone Encounter (Signed)
 Called pt to schedule procedure with Dr. Jinx Mourning. Left VM for her to call me back. JM

## 2024-03-09 ENCOUNTER — Ambulatory Visit: Admitting: Hematology and Oncology

## 2024-03-10 ENCOUNTER — Inpatient Hospital Stay: Attending: Hematology and Oncology | Admitting: Hematology and Oncology

## 2024-03-10 VITALS — BP 148/88 | HR 61 | Temp 97.6°F | Resp 18 | Ht 68.0 in | Wt 186.0 lb

## 2024-03-10 DIAGNOSIS — I871 Compression of vein: Secondary | ICD-10-CM | POA: Diagnosis not present

## 2024-03-10 DIAGNOSIS — Z7901 Long term (current) use of anticoagulants: Secondary | ICD-10-CM | POA: Insufficient documentation

## 2024-03-10 DIAGNOSIS — Z86711 Personal history of pulmonary embolism: Secondary | ICD-10-CM | POA: Diagnosis present

## 2024-03-10 DIAGNOSIS — Z86718 Personal history of other venous thrombosis and embolism: Secondary | ICD-10-CM | POA: Diagnosis present

## 2024-03-10 NOTE — Progress Notes (Signed)
 Patient Care Team: Gabriel John, NP as PCP - General (Internal Medicine)  DIAGNOSIS:  Encounter Diagnosis  Name Primary?   May-Thurner syndrome Yes    CHIEF COMPLIANT: Follow-up of May-Thurner syndrome  HISTORY OF PRESENT ILLNESS: Heather Hudson is 69 year old with the above-mentioned history of May Thurner syndrome which has led to multiple complications with thrombosis.  She is currently on Eliquis  and tolerating extremely well.  She used to be on Lovenox  but she does not want to do those anymore.  She was referred back to us  for discussion because recent scans have shown stent thrombosis.  She is receiving physical therapy for her ankle.   ALLERGIES:  is allergic to metformin  and related, aspirin, ibuprofen, other, and propofol .  MEDICATIONS:  Current Outpatient Medications  Medication Sig Dispense Refill   apixaban  (ELIQUIS ) 5 MG TABS tablet Take 1 tablet (5 mg total) by mouth 2 (two) times daily. 60 tablet 12   Blood Glucose Monitoring Suppl DEVI 1 each by Does not apply route in the morning, at noon, and at bedtime. May substitute to any manufacturer covered by patient's insurance. 1 each 0   Calcium  Carb-Cholecalciferol 600-12.5 MG-MCG TABS Take 2 tablets by mouth daily.     clopidogrel  (PLAVIX ) 75 MG tablet Take 1 tablet (75 mg total) by mouth daily. 90 tablet 3   folic acid  (FOLVITE ) 1 MG tablet Take 2 mg by mouth daily.     losartan  (COZAAR ) 25 MG tablet TAKE 1 TABLET BY MOUTH EVERY DAY FOR BLOOD PRESSURE 90 tablet 0   omeprazole (PRILOSEC) 20 MG capsule Take 20 mg by mouth daily.     No current facility-administered medications for this visit.    PHYSICAL EXAMINATION: ECOG PERFORMANCE STATUS: 1 - Symptomatic but completely ambulatory  Vitals:   03/10/24 1059  BP: (!) 148/88  Pulse: 61  Resp: 18  Temp: 97.6 F (36.4 C)  SpO2: 98%   Filed Weights   03/10/24 1059  Weight: 186 lb (84.4 kg)    LABORATORY DATA:  I have reviewed the data as listed    Latest Ref  Rng & Units 03/01/2024    9:21 AM 10/23/2023    6:53 AM 10/22/2023    9:00 AM  CMP  Glucose 70 - 99 mg/dL  664  403   BUN 8 - 23 mg/dL  14  17   Creatinine 4.74 - 1.00 mg/dL  2.59  5.63   Sodium 875 - 145 mmol/L  138  138   Potassium 3.5 - 5.1 mmol/L  4.0  4.0   Chloride 98 - 111 mmol/L  109  110   CO2 22 - 32 mmol/L  21  19   Calcium  8.9 - 10.3 mg/dL  64.3  32.9   Total Protein 6.0 - 8.3 g/dL 6.6     Total Bilirubin 0.2 - 1.2 mg/dL 0.9     Alkaline Phos 39 - 117 U/L 107     AST 0 - 37 U/L 25     ALT 0 - 35 U/L 33       Lab Results  Component Value Date   WBC 5.7 10/23/2023   HGB 10.8 (L) 10/23/2023   HCT 31.6 (L) 10/23/2023   MCV 85.6 10/23/2023   PLT 153 10/23/2023   NEUTROABS 5.1 05/21/2023    ASSESSMENT & PLAN:  May-Thurner syndrome Acute pulmonary embolism and bilateral lower extremity DVT: 05/14/2023: Acute bilateral PE involving both main pulmonary arteries, numerous lobar segmental subsegmental arteries with right heart strain  right middle lobe wedge-shaped.  consolidation: Infarcts   No clear-cut predisposing factors. (Prior history of COVID-19, autoimmune/mucocutaneous pemphigoid, on prednisone  and methotrexate ) Status post thrombectomy and thrombolysis   Hypercoagulability workup: Negative for factor V Leiden gene mutation, normal protein C protein S and Antithrombin levels.  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  Successful IVC filter retrieval and thrombectomy of indwelling ileal stents, left common femoral vein stent placement   Current treatment: Eliquis  Duration of anticoagulation: Lifelong CT angiogram 11/04/2023: Significant resolution of previous PE, very small volume residual nonocclusive PE in the right lower lobe, 4 mm subpleural nodule left lower lobe CT venogram 11/07/2023: Post  IVC filter retrieval persistent nonocclusive chronic thrombus suprarenal IVC CT chest 02/18/2024: Sequelae of pulmonary infarction CT venogram 02/08/2024: Long segment occlusion of the left common/external iliac vein stent, IVC thrombus resolved   I discussed with the patient that there is continued issues with stent thrombosis. Patient is not at all interested in changing to Lovenox .  I discussed with her that Eliquis  is superior to her anticoagulant compared to other options.  Therefore we decided to leave her at the same dosage.   No orders of the defined types were placed in this encounter.  The patient has a good understanding of the overall plan. she agrees with it. she will call with any problems that may develop before the next visit here. Total time spent: 30 mins including face to face time and time spent for planning, charting and co-ordination of care   Heather K Ishia Tenorio, MD 03/10/24

## 2024-03-10 NOTE — Assessment & Plan Note (Signed)
 Acute pulmonary embolism and bilateral lower extremity DVT: 05/14/2023: Acute bilateral PE involving both main pulmonary arteries, numerous lobar segmental subsegmental arteries with right heart strain right middle lobe wedge-shaped.  consolidation: Infarcts   No clear-cut predisposing factors. (Prior history of COVID-19, autoimmune/mucocutaneous pemphigoid, on prednisone  and methotrexate ) Status post thrombectomy and thrombolysis   Hypercoagulability workup: Negative for factor V Leiden gene mutation, normal protein C protein S and Antithrombin levels.  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  Successful IVC filter retrieval and thrombectomy of indwelling ileal stents, left common femoral vein stent placement   Current treatment: Lovenox  Duration of anticoagulation: Lifelong CT angiogram 11/04/2023: Significant resolution of previous PE, very small volume residual nonocclusive PE in the right lower lobe, 4 mm subpleural nodule left lower lobe CT venogram 11/07/2023: Post IVC filter retrieval persistent nonocclusive chronic thrombus suprarenal IVC CT chest 02/18/2024: Sequelae of pulmonary infarction CT venogram 02/08/2024: Long segment occlusion of the left common/external iliac vein stent, IVC thrombus resolved   I discussed with the patient that there is continued issues with stent thrombosis. Follow-up with vascular surgery for that. Continue with Lovenox  injections.

## 2024-03-11 ENCOUNTER — Ambulatory Visit
Admission: RE | Admit: 2024-03-11 | Discharge: 2024-03-11 | Disposition: A | Discharge status: Home / Self Care | Source: Ambulatory Visit | Attending: Primary Care | Admitting: Primary Care

## 2024-03-11 DIAGNOSIS — Z1231 Encounter for screening mammogram for malignant neoplasm of breast: Secondary | ICD-10-CM

## 2024-03-14 ENCOUNTER — Other Ambulatory Visit: Payer: Self-pay

## 2024-03-14 ENCOUNTER — Encounter (HOSPITAL_COMMUNITY): Payer: Self-pay | Admitting: Interventional Radiology

## 2024-03-14 DIAGNOSIS — Z01818 Encounter for other preprocedural examination: Secondary | ICD-10-CM

## 2024-03-14 MED ORDER — CHLORHEXIDINE GLUCONATE CLOTH 2 % EX PADS: 6.0000 | MEDICATED_PAD | Freq: Every day | CUTANEOUS | Status: AC

## 2024-03-14 NOTE — H&P (Shared)
 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 Fonnie Iba, Dr. Cameron Cea    Supervising Physician: Creasie Doctor  Patient Status: [facility] - Out-pt  History of Present Illness: Heather Hudson is a 68 y.o. female with PMH significant for HTN, colovescical fistula, May Thurner Syndrome and LLE DVT and PE August 2024. At that time she underwent thrombectomy and IVC filter placement. She subsequently developed in-stent thrombus which required recanalization and revision to extend to L common femoral vein 10/22/23. The IVC filter was also removed and she was discharged on lovenox  (later converted to her current therapy of eliquis  and plavix ). A plan was made for repeat imaging in 3 months; CT chest and CT venogram abdomen/pelvis completed 02/08/24 and revealed:  The IVC appears patent. The right common and external iliac veins as well as the left internal iliac vein appear patent. There is long segment occlusion of the left common/external iliac vein stent which extends into the left common femoral vein.  The patient was seen by Dr. Jinx Mourning in clinic 02/22/24 and made a plan with patient for her to return for stent thrombectomy/revision with General Anesthesia at [facility]. In the meantime she was asked to continue AC/AP, continue exercise, and use compression hose.   Confirms NPO since MN ***.  Does not wear CPAP or use supplemental home O2 ***  Denies fever, chills, SOB, CP, sore throat, N/V, abd pain, blood in stool or urine, abnormal bruising, leg swelling, back pain.   Allergies Reviewed:  Metformin  and related, Aspirin, Ibuprofen, Other, and Propofol    *** Patient is Full Code  Past Medical History:  Diagnosis Date   Acute pulmonary embolism (HCC) 05/14/2023   Adenomatous colon polyp 2015   Allergy 1974   teenager, and 2008   Colovesical fistula 2015   COVID-19 06/2020   Environmental and seasonal allergies     Family history of adverse reaction to anesthesia    Sister with Malignant hyperthermia   Hx of adenomatous colonic polyps 01/19/2021   Hypertension 11/23   Low dose prescription   Lower abdominal pain 12/23/2019   Malignant hyperthermia    sister   Mass of soft tissue of abdomen 08/18/2023   May-Thurner syndrome    Migraines    Motion sickness    Persistent cough for 3 weeks or longer 10/10/2021   Pulmonary embolism (HCC) 05/14/2023    Past Surgical History:  Procedure Laterality Date   COLON SURGERY  2016   fistula, small section removed   COLONOSCOPY  2015   Plus others   IR FEM POP ART STENT INC PTA MOD SED  10/22/2023   IR INTRAVASCULAR ULTRASOUND NON CORONARY  05/19/2023   IR INTRAVASCULAR ULTRASOUND NON CORONARY  07/16/2023   IR IVC FILTER PLMT / S&I /IMG GUID/MOD SED  05/19/2023   IR IVC FILTER RETRIEVAL / S&I /IMG GUID/MOD SED  10/22/2023   IR IVUS EACH ADDITIONAL NON CORONARY VESSEL  09/15/2023   IR PTA VENOUS EXCEPT DIALYSIS CIRCUIT  05/19/2023   IR PTA VENOUS EXCEPT DIALYSIS CIRCUIT  07/16/2023   IR RADIOLOGIST EVAL & MGMT  07/01/2023   IR RADIOLOGIST EVAL & MGMT  08/21/2023   IR RADIOLOGIST EVAL & MGMT  11/13/2023   IR RADIOLOGIST EVAL & MGMT  02/22/2024   IR THROMBECT VENO MECH MOD SED  05/19/2023   IR THROMBECT VENO MECH MOD SED  07/16/2023   IR THROMBECT VENO MECH MOD SED  10/22/2023  IR TRANSCATH PLC STENT 1ST ART NOT LE CV CAR VERT CAR  05/19/2023   IR TRANSCATH PLC STENT 1ST ART NOT LE CV CAR VERT CAR  07/16/2023   IR US  GUIDE VASC ACCESS LEFT  05/19/2023   IR US  GUIDE VASC ACCESS LEFT  07/16/2023   IR US  GUIDE VASC ACCESS RIGHT  05/19/2023   IR US  GUIDE VASC ACCESS RIGHT  09/15/2023   IR VENO/EXT/UNI LEFT  05/19/2023   IR VENO/EXT/UNI LEFT  07/16/2023   IR VENO/EXT/UNI LEFT  10/22/2023   IR VENOCAVAGRAM IVC  05/19/2023   IR VENOCAVAGRAM IVC  07/16/2023   IR VENOCAVAGRAM IVC  09/15/2023   IR VENOCAVAGRAM IVC  10/22/2023   LAPAROSCOPIC LEFT COLON RESECTION  2015    Colovesical fistula, [place]   Left peroneal tendon repair Left 2023   RADIOLOGY WITH ANESTHESIA N/A 10/22/2023   Procedure: LLE venous thrombectomy;  Surgeon: Federico Hopkins, MD;  Location: [facility] OR;  Service: Radiology;  Laterality: N/A;   WISDOM TOOTH EXTRACTION        Medications: Prior to Admission medications   Medication Sig Start Date End Date Taking? Authorizing Provider  apixaban  (ELIQUIS ) 5 MG TABS tablet Take 1 tablet (5 mg total) by mouth 2 (two) times daily. 11/18/23   Gudena, Vinay, MD  Blood Glucose Monitoring Suppl DEVI 1 each by Does not apply route in the morning, at noon, and at bedtime. May substitute to any manufacturer covered by patient's insurance. 07/27/23   Gabriel John, NP  Calcium  Carb-Cholecalciferol 600-12.5 MG-MCG TABS Take 2 tablets by mouth daily.    [provider]  clopidogrel  (PLAVIX ) 75 MG tablet Take 1 tablet (75 mg total) by mouth daily. 07/16/23 07/10/24  Covington, Jamie R, NP  folic acid  (FOLVITE ) 1 MG tablet Take 2 mg by mouth daily. 09/14/23   [provider]  losartan  (COZAAR ) 25 MG tablet TAKE 1 TABLET BY MOUTH EVERY DAY FOR BLOOD PRESSURE 01/10/24   Clark, Katherine K, NP  omeprazole (PRILOSEC) 20 MG capsule Take 20 mg by mouth daily.    [provider]     Family History  Problem Relation Age of Onset   Heart disease Mother    Diabetes Mother    Hearing loss Mother    Heart disease Father    Prostate cancer Father    Alcohol abuse Father    Cancer Father    COPD Father    Malignant hyperthermia Sister    Pancreatic cancer Maternal Aunt    Diabetes Brother    Cancer Maternal Grandmother    Miscarriages / Stillbirths Maternal Grandmother    Diabetes Brother    Intellectual disability Brother    Learning disabilities Brother    Vision loss Brother    Miscarriages / Stillbirths Sister    Miscarriages / [place] Sister    Colon cancer Neg Hx    Esophageal cancer Neg Hx    Stomach cancer Neg Hx     Liver disease Neg Hx    Breast cancer Neg Hx     Social History   Socioeconomic History   Marital status: Married    Spouse name: Not on file   Number of children: Not on file   Years of education: Not on file   Highest education level: Associate degree: occupational, Scientist, product/process development, or vocational program  Occupational History   Not on file  Tobacco Use   Smoking status: Never   Smokeless tobacco: Never   Tobacco comments:  Never smoked  Vaping Use   Vaping status: Never Used  Substance and Sexual Activity   Alcohol use: Yes    Alcohol/week: 5.0 standard drinks of alcohol    Types: 5 Glasses of wine per week   Drug use: Never   Sexual activity: Yes    Birth control/protection: Post-menopausal  Other Topics Concern   Not on file  Social History Narrative   Married.  No children.   Moved from [place] area of [place]  in 2018    Worked in [employer], Insurance account manager.  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   Enjoys wine tasting.    1-2 caffeinated drinks a day 0-1 alcoholic never smoker no drug use   Sister Kingsley Penny works Adult nurse endoscopy as Charity fundraiser   Social Drivers of Corporate investment banker Strain: Low Risk  (10/01/2023)   Overall Financial Resource Strain (CARDIA)    Difficulty of Paying Living Expenses: Not hard at all  Food Insecurity: No Food Insecurity (10/22/2023)   Hunger Vital Sign    Worried About Running Out of Food in the Last Year: Never true    Ran Out of Food in the Last Year: Never true  Transportation Needs: No Transportation Needs (10/22/2023)   PRAPARE - Administrator, Civil Service (Medical): No    Lack of Transportation (Non-Medical): No  Physical Activity: Unknown (10/01/2023)   Exercise Vital Sign    Days of Exercise per Week: 0 days    Minutes of Exercise per Session: Not on file  Stress: No Stress Concern Present (10/01/2023)   [employer] of  Occupational Health - Occupational Stress Questionnaire    Feeling of Stress : Not at all  Social Connections: Socially Integrated (10/22/2023)   Social Connection and Isolation Panel [NHANES]    Frequency of Communication with Friends and Family: More than three times a week    Frequency of Social Gatherings with Friends and Family: More than three times a week    Attends Religious Services: More than 4 times per year    Active Member of [employer] or Organizations: Yes    Attends Engineer, structural: More than 4 times per year    Marital Status: Married     Review of Systems: A 12 point ROS discussed and pertinent positives are indicated in the HPI above.  All other systems are negative.  Review of Systems  Vital Signs: There were no vitals taken for this visit.  Advance Care Plan: {Advance Care ZOXW:96045}    Physical Exam  Imaging: IR Radiologist Eval & Mgmt Result Date: 02/22/2024 EXAM: ESTABLISHED PATIENT OFFICE VISIT CHIEF COMPLAINT: See Epic note. HISTORY OF PRESENT ILLNESS: See Epic note. REVIEW OF SYSTEMS: See Epic note. PHYSICAL EXAMINATION: See Epic note. ASSESSMENT AND PLAN: See Epic note. Creasie Doctor, MD [facility] Electronically Signed   By: Creasie Doctor M.D.   On: 02/22/2024 09:51    Labs:  CBC: Recent Labs    05/21/23 0643 07/16/23 0927 10/22/23 0900 10/23/23 0653  WBC 6.1 6.2 4.3 5.7  HGB 8.7* 12.7 13.1 10.8*  HCT 25.5* 38.2 38.9 31.6*  PLT 265 190 155 153    COAGS: Recent Labs    05/14/23 1930 05/15/23 0146 07/16/23 0927 10/22/23 0900  INR 1.1 1.1 1.1 1.3*  APTT 32 65*  --   --     BMP: Recent Labs  07/16/23 0927 09/15/23 0709 10/22/23 0900 10/23/23 0653  NA 142 138 138 138  K 3.5 3.6 4.0 4.0  CL 112* 112* 110 109  CO2 20* 19* 19* 21*  GLUCOSE 94 110* 105* 121*  BUN 9 9 17 14   CALCIUM  10.3 9.8 10.1 10.3  CREATININE 0.71 0.69 0.62 0.64  GFRNONAA >60 >60 >60 >60     LIVER FUNCTION TESTS: Recent Labs    05/14/23 1749 05/15/23 0146 05/18/23 1129 05/20/23 1245 05/21/23 0645 05/22/23 1104 03/01/24 0921  BILITOT 1.5* 1.3*  --   --   --   --  0.9  AST 26 21  --   --   --   --  25  ALT 42 39  --   --   --   --  33  ALKPHOS 74 74  --   --   --   --  107  PROT 6.5 5.9*  --   --   --   --  6.6  ALBUMIN 3.7 3.0*   < > 2.6* 2.4* 2.6* 4.5   < > = values in this interval not displayed.    TUMOR MARKERS: No results for input(s): "AFPTM", "CEA", "CA199", "CHROMGRNA" in the last 8760 hours.  Assessment and Plan:  Request for  image guided left iliac thrombectomy and stent revision approved for 03/14/24 by Dr.Suttle No contraindications for procedure identified in ROS, physical exam, or review of pre-sedation considerations. *** Labs reviewed and within acceptable range 02/08/24 imaging available and reviewed *** VSS, afebrile *** Patient takes plavix  and eliquis  Abx ***?Drug coated angioplasty placement gets cefazolin, but need to touch base in huddle for revision abx needs    Risks and benefits of image guided left iliac thrombectomy and stent revision were discussed with the patient including, but not limited to bleeding, infection, vascular injury, ischemia, or contrast induced renal failure.  This interventional procedure involves the use of X-rays and because of the nature of the planned procedure, it is possible that we will have prolonged use of X-ray fluoroscopy.  Potential radiation risks to you include (but are not limited to) the following: - A slightly elevated risk for cancer  several years later in life. This risk is typically less than 0.5% percent. This risk is low in comparison to the normal incidence of human cancer, which is 33% for women and 50% for men according to the American Cancer Society. - Radiation induced injury can include skin redness, resembling a rash, tissue breakdown / ulcers and hair loss (which can be temporary or  permanent).   The likelihood of either of these occurring depends on the difficulty of the procedure and whether you are sensitive to radiation due to previous procedures, disease, or genetic conditions.   IF your procedure requires a prolonged use of radiation, you will be notified and given written instructions for further action.  It is your responsibility to monitor the irradiated area for the 2 weeks following the procedure and to notify your physician if you are concerned that you have suffered a radiation induced injury.    All of the patient's questions were answered, patient is agreeable to proceed.  Consent signed and in chart.  ***   Thank you for allowing our service to participate in KARSTEN VAUGHN 's care.    Electronically Signed: Terressa Fess, NP   03/14/2024, 3:41 PM     I spent a total of {New JXBJ:[id number]} {New Out-Pt:[id number]}  {Established Out-Pt:[id number]} in face to face  in clinical consultation, greater than 50% of which was counseling/coordinating care for ***   (A copy of this note was sent to the referring provider and the time of visit.)

## 2024-03-14 NOTE — Progress Notes (Signed)
 SDW call  Patient was given pre-op instructions over the phone. Patient verbalized understanding of instructions provided.     PCP - Tretha Fu, NP Cardiologist - Dr. Ahmad Alert Hematology: Dr. Cameron Cea Pulmonary:    PPM/ICD - denies Device Orders - na Rep Notified - na   Chest x-ray - 05/05/2023 EKG -  05/20/2023 Stress Test - ECHO - 05/15/2023 Cardiac Cath -   Sleep Study/sleep apnea/CPAP: denies  Non-diabetic  Blood Thinner Instructions: Eliquis  and Plavix , continue Aspirin Instructions:denies   ERAS Protcol - NPO  Anesthesia review: Yes. HTN, hx PE, May-Thurner syndrome, Sister had Malignant Hyperthermia   Patient denies shortness of breath, fever, cough and chest pain over the phone call  Your procedure is scheduled on Wednesday March 16, 2024  Report to [facility] Main Entrance "A" at  0730  A.M., then check in with the Admitting office.  Call this number if you have problems the morning of surgery:  [phone]   If you have any questions prior to your surgery date call [phone]: Open Monday-Friday 8am-4pm If you experience any cold or flu symptoms such as cough, fever, chills, shortness of breath, etc. between now and your scheduled surgery, please notify us  at the above number    Remember:  Do not eat or drink after midnight the night before your surgery  Take these medicines the morning of surgery with A SIP OF WATER:  Eliquis , plavix , prilosec  As of today, STOP taking any Aspirin (unless otherwise instructed by your surgeon) Aleve, Naproxen, Ibuprofen, Motrin, Advil, Goody's, BC's, all herbal medications, fish oil, and all vitamins.

## 2024-03-15 ENCOUNTER — Other Ambulatory Visit (HOSPITAL_COMMUNITY): Payer: Self-pay | Admitting: Student

## 2024-03-15 ENCOUNTER — Other Ambulatory Visit: Payer: Self-pay | Admitting: Interventional Radiology

## 2024-03-15 DIAGNOSIS — Z01818 Encounter for other preprocedural examination: Secondary | ICD-10-CM

## 2024-03-15 NOTE — H&P (Deleted)
   The note originally documented on this encounter has been moved the the encounter in which it belongs.

## 2024-03-15 NOTE — Anesthesia Preprocedure Evaluation (Addendum)
 Anesthesia Evaluation  Patient identified by MRN, date of birth, ID band Patient awake    Reviewed: Allergy & Precautions, NPO status , Patient's Chart, lab work & pertinent test results  History of Anesthesia Complications (+) Family history of anesthesia reaction and history of anesthetic complications (sister with MH)  Airway Mallampati: II  TM Distance: >3 FB Neck ROM: Full    Dental  (+) Dental Advisory Given   Pulmonary neg pulmonary ROS   breath sounds clear to auscultation       Cardiovascular hypertension, Pt. on medications + Peripheral Vascular Disease and + DVT   Rhythm:Regular Rate:Normal  '24 ECHO: EF 60-65%, normal LVF, normal RVF, no significant valvular abnormalities May-Turner syndrome: hypercoagulable   Neuro/Psych  Headaches    GI/Hepatic Neg liver ROS,GERD  Medicated and Controlled,,  Endo/Other  diabetes (diet control, glu 112)    Renal/GU negative Renal ROS     Musculoskeletal   Abdominal   Peds  Hematology Eliquis  plavix    Anesthesia Other Findings May-Turner syndrome: R iliac artery compresses L iliac vein....recurrent thrombosis  Reproductive/Obstetrics                             Anesthesia Physical Anesthesia Plan  ASA: 3  Anesthesia Plan: General   Post-op Pain Management: Tylenol  PO (pre-op)*   Induction: Intravenous  PONV Risk Score and Plan: 3 and Ondansetron , Dexamethasone , Treatment may vary due to age or medical condition and TIVA  Airway Management Planned: Oral ETT  Additional Equipment: None  Intra-op Plan:   Post-operative Plan: Extubation in OR  Informed Consent: I have reviewed the patients History and Physical, chart, labs and discussed the procedure including the risks, benefits and alternatives for the proposed anesthesia with the patient or authorized representative who has indicated his/her understanding and acceptance.      Dental advisory given  Plan Discussed with: CRNA and Surgeon  Anesthesia Plan Comments: (Non-triggering GA: propofol , Remifentanyl  PAT note by Rudy Costain, PA-C:  68 year old female with history of May-Thurner Syndrome with recent hospitalization August 2024 for acute bilateral PE and left lower extremity DVT. She underwent IVC filter placement with thrombectomy/angioplasty of left iliac and femoral veins and left iliac vein stent placement on 05/19/23.  She had a brief episode of atrial fibrillation during admission.  Unfortunately she experienced thrombosis of the central and peripheral aspects of the left iliac vein stent with non-occlusive thrombus throughout the left popliteal through common femoral veins. She underwent left lower extremity venogram with left iliofemoral aspiration thrombectomy, balloon angioplasty and stent placement 07/16/23. She tolerated the procedure well and was discharged home the same day.   She unfortunately experienced in-stent thrombus and therefore the stents were revised, extending into the left common femoral vein on 10/22/23. The IVC filter was removed at that time.  On follow-up with Dr. Jinx Mourning on 02/22/2024 she was again noted to have nonocclusive recurrent thrombus within the iliac stent system with associated left lower extremity swelling despite anticoagulation/antiplatelet compliance.  She was recommended to undergo stent thrombectomy/revision.  Notably, she has had prior negative hypercoagulability workup.  Family history of malignant hyperthermia in patient's sister in 42.  Patient denies any personal history of anesthesia complications.  She had general anesthesia 08/03/2014 for laparoscopic sigmoid colon resection, triggering agents avoided.  Anesthesia records available in Care Everywhere.  MH precautions were also used for anesthesia 10/22/2023.  She will need day of surgery labs and evaluation.  TTE 05/15/2023:  1. Right ventricular systolic  function is mildly reduced. The right  ventricular size is normal. There is moderately elevated pulmonary artery  systolic pressure. The estimated right ventricular systolic pressure is  49.5 mmHg.  2. Left ventricular ejection fraction, by estimation, is 60 to 65%. The  left ventricle has normal function. The left ventricle has no regional  wall motion abnormalities.  3. The mitral valve is grossly normal. Trivial mitral valve  regurgitation. No evidence of mitral stenosis.  4. The aortic valve is tricuspid. Aortic valve regurgitation is trivial.  No aortic stenosis is present.  5. The inferior vena cava is normal in size with greater than 50%  respiratory variability, suggesting right atrial pressure of 3 mmHg.   Comparison(s): Changes from prior study are noted. LV function unchanged.  RV function and size unchanged. RVSP elevated up to 49 mmHG on this study.   Coronary calcium  scoring 04/20/2023: IMPRESSION: 1. Coronary calcium  score of 0. This was 1st percentile for age-, race-, and sex-matched controls.  2. Mild aortic atherosclerosis.   )        Anesthesia Quick Evaluation

## 2024-03-15 NOTE — Progress Notes (Signed)
 Anesthesia Chart Review: Same-day workup  68 year old female with history of May-Thurner Syndrome with recent hospitalization August 2024 for acute bilateral PE and left lower extremity DVT. She underwent IVC filter placement with thrombectomy/angioplasty of left iliac and femoral veins and left iliac vein stent placement on 05/19/23.  She had a brief episode of atrial fibrillation during admission.  Unfortunately she experienced thrombosis of the central and peripheral aspects of the left iliac vein stent with non-occlusive thrombus throughout the left popliteal through common femoral veins. She underwent left lower extremity venogram with left iliofemoral aspiration thrombectomy, balloon angioplasty and stent placement 07/16/23. She tolerated the procedure well and was discharged home the same day.   She unfortunately experienced in-stent thrombus and therefore the stents were revised, extending into the left common femoral vein on 10/22/23. The IVC filter was removed at that time.  On follow-up with Dr. Jinx Mourning on 02/22/2024 she was again noted to have nonocclusive recurrent thrombus within the iliac stent system with associated left lower extremity swelling despite anticoagulation/antiplatelet compliance.  She was recommended to undergo stent thrombectomy/revision.  Notably, she has had prior negative hypercoagulability workup.   Family history of malignant hyperthermia in patient's sister in 5.  Patient denies any personal history of anesthesia complications.  She had general anesthesia 08/03/2014 for laparoscopic sigmoid colon resection, triggering agents avoided.  Anesthesia records available in Care Everywhere.  MH precautions were also used for anesthesia 10/22/2023.   She will need day of surgery labs and evaluation.   TTE 05/15/2023:  1. Right ventricular systolic function is mildly reduced. The right  ventricular size is normal. There is moderately elevated pulmonary artery  systolic pressure.  The estimated right ventricular systolic pressure is  49.5 mmHg.   2. Left ventricular ejection fraction, by estimation, is 60 to 65%. The  left ventricle has normal function. The left ventricle has no regional  wall motion abnormalities.   3. The mitral valve is grossly normal. Trivial mitral valve  regurgitation. No evidence of mitral stenosis.   4. The aortic valve is tricuspid. Aortic valve regurgitation is trivial.  No aortic stenosis is present.   5. The inferior vena cava is normal in size with greater than 50%  respiratory variability, suggesting right atrial pressure of 3 mmHg.   Comparison(s): Changes from prior study are noted. LV function unchanged.  RV function and size unchanged. RVSP elevated up to 49 mmHG on this study.    Coronary calcium  scoring 04/20/2023: IMPRESSION: 1. Coronary calcium  score of 0. This was 1st percentile for age-, race-, and sex-matched controls.   2. Mild aortic atherosclerosis.      Edilia Gordon [facility] Short Stay Center/Anesthesiology Phone [phone] 03/15/2024 3:39 PM

## 2024-03-15 NOTE — H&P (Addendum)
 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 Leatrice Chapel, Dr. Mackey Chad   Supervising Physician: Jennefer Rover  Patient Status: [facility] - Out-pt  History of Present Illness: Heather Hudson is a 68 y.o. female with PMH significant for HTN, colovescical fistula, May Thurner Syndrome and LLE DVT and PE August 2024. At that time she underwent thrombectomy and IVC filter placement. She subsequently developed in-stent thrombus which required recanalization and revision to extend to L common femoral vein 10/22/23. The IVC filter was also removed and she was discharged on lovenox  (later converted to her current therapy of eliquis  and plavix ). A plan was made for repeat imaging in 3 months; CT chest and CT venogram abdomen/pelvis completed 02/08/24 and revealed:  The IVC appears patent. The right common and external iliac veins as well as the left internal iliac vein appear patent. There is long segment occlusion of the left common/external iliac vein stent which extends into the left common femoral vein.  The patient was seen by Dr. Jennefer in clinic 02/22/24 and made a plan with patient for her to return for stent thrombectomy/revision with General Anesthesia at [facility]. In the meantime she was asked to continue AC/AP, continue exercise, and use compression hose.   Complaint of chronic white spots on her gum, states that it is due to her autoimmune dieses.  States that her left leg swells up sometimes but it looks great today. No left leg pain.  Denies fever, chills, SOB, CP, sore throat, N/V, abd pain, blood in stool or urine, abnormal bruising, leg swelling, back pain.   Allergies Reviewed:  Metformin  and related, Aspirin, Ibuprofen, Other, and Propofol     Past Medical History:  Diagnosis Date   Acute pulmonary embolism (HCC) 05/14/2023   Adenomatous colon polyp 2015   Allergy 1974   teenager, and 2008    Colovesical fistula 2015   COVID-19 06/2020   Environmental and seasonal allergies    Family history of adverse reaction to anesthesia    Sister with Malignant hyperthermia   Hx of adenomatous colonic polyps 01/19/2021   Hypertension 11/23   Low dose prescription   Lower abdominal pain 12/23/2019   Malignant hyperthermia    sister   Mass of soft tissue of abdomen 08/18/2023   May-Thurner syndrome    Migraines    Motion sickness    Persistent cough for 3 weeks or longer 10/10/2021   Pulmonary embolism (HCC) 05/14/2023    Past Surgical History:  Procedure Laterality Date   COLON SURGERY  2016   fistula, small section removed   COLONOSCOPY  2015   Plus others   IR FEM POP ART STENT INC PTA MOD SED  10/22/2023   IR INTRAVASCULAR ULTRASOUND NON CORONARY  05/19/2023   IR INTRAVASCULAR ULTRASOUND NON CORONARY  07/16/2023   IR IVC FILTER PLMT / S&I /IMG GUID/MOD SED  05/19/2023   IR IVC FILTER RETRIEVAL / S&I /IMG GUID/MOD SED  10/22/2023   IR IVUS EACH ADDITIONAL NON CORONARY VESSEL  09/15/2023   IR PTA VENOUS EXCEPT DIALYSIS CIRCUIT  05/19/2023   IR PTA VENOUS EXCEPT DIALYSIS CIRCUIT  07/16/2023   IR RADIOLOGIST EVAL & MGMT  07/01/2023   IR RADIOLOGIST EVAL & MGMT  08/21/2023   IR RADIOLOGIST EVAL & MGMT  11/13/2023   IR RADIOLOGIST EVAL & MGMT  02/22/2024   IR THROMBECT VENO MECH MOD SED  05/19/2023   IR THROMBECT VENO MECH MOD SED  07/16/2023   IR THROMBECT VENO MECH MOD SED  10/22/2023   IR TRANSCATH PLC STENT 1ST ART NOT LE CV CAR VERT CAR  05/19/2023   IR TRANSCATH PLC STENT 1ST ART NOT LE CV CAR VERT CAR  07/16/2023   IR US  GUIDE VASC ACCESS LEFT  05/19/2023   IR US  GUIDE VASC ACCESS LEFT  07/16/2023   IR US  GUIDE VASC ACCESS RIGHT  05/19/2023   IR US  GUIDE VASC ACCESS RIGHT  09/15/2023   IR VENO/EXT/UNI LEFT  05/19/2023   IR VENO/EXT/UNI LEFT  07/16/2023   IR VENO/EXT/UNI LEFT  10/22/2023   IR VENOCAVAGRAM IVC  05/19/2023   IR VENOCAVAGRAM IVC  07/16/2023   IR VENOCAVAGRAM IVC   09/15/2023   IR VENOCAVAGRAM IVC  10/22/2023   LAPAROSCOPIC LEFT COLON RESECTION  2015   Colovesical fistula, [place]   Left peroneal tendon repair Left 2023   RADIOLOGY WITH ANESTHESIA N/A 10/22/2023   Procedure: LLE venous thrombectomy;  Surgeon: Jennefer Ester PARAS, MD;  Location: [facility] OR;  Service: Radiology;  Laterality: N/A;   WISDOM TOOTH EXTRACTION        Medications: Prior to Admission medications   Medication Sig Start Date End Date Taking? Authorizing Provider  apixaban  (ELIQUIS ) 5 MG TABS tablet Take 1 tablet (5 mg total) by mouth 2 (two) times daily. 11/18/23   Gudena, Vinay, MD  Blood Glucose Monitoring Suppl DEVI 1 each by Does not apply route in the morning, at noon, and at bedtime. May substitute to any manufacturer covered by patient's insurance. 07/27/23   Gretta Comer POUR, NP  Calcium  Carb-Cholecalciferol 600-12.5 MG-MCG TABS Take 2 tablets by mouth daily.    [provider]  clopidogrel  (PLAVIX ) 75 MG tablet Take 1 tablet (75 mg total) by mouth daily. 07/16/23 07/10/24  Covington, Jamie R, NP  folic acid  (FOLVITE ) 1 MG tablet Take 2 mg by mouth daily. 09/14/23   [provider]  losartan  (COZAAR ) 25 MG tablet TAKE 1 TABLET BY MOUTH EVERY DAY FOR BLOOD PRESSURE 01/10/24   Clark, Katherine K, NP  omeprazole (PRILOSEC) 20 MG capsule Take 20 mg by mouth daily.    [provider]     Family History  Problem Relation Age of Onset   Heart disease Mother    Diabetes Mother    Hearing loss Mother    Heart disease Father    Prostate cancer Father    Alcohol abuse Father    Cancer Father    COPD Father    Malignant hyperthermia Sister    Pancreatic cancer Maternal Aunt    Diabetes Brother    Cancer Maternal Grandmother    Miscarriages / Stillbirths Maternal Grandmother    Diabetes Brother    Intellectual disability Brother    Learning disabilities Brother    Vision loss Brother    Miscarriages / Stillbirths Sister    Miscarriages / [place]  Sister    Colon cancer Neg Hx    Esophageal cancer Neg Hx    Stomach cancer Neg Hx    Liver disease Neg Hx    Breast cancer Neg Hx     Social History   Socioeconomic History   Marital status: Married    Spouse name: Not on file   Number of children: Not on file   Years of education: Not on file   Highest education level: Associate degree: occupational, Scientist, product/process development, or vocational program  Occupational History   Not on file  Tobacco Use   Smoking status:  Never   Smokeless tobacco: Never   Tobacco comments:    Never smoked  Vaping Use   Vaping status: Never Used  Substance and Sexual Activity   Alcohol use: Yes    Alcohol/week: 5.0 standard drinks of alcohol    Types: 5 Glasses of wine per week   Drug use: Never   Sexual activity: Yes    Birth control/protection: Post-menopausal  Other Topics Concern   Not on file  Social History Narrative   Married.  No children.   Moved from [place] area of [place]  in 2018    Worked in [employer], Insurance account manager.  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   Enjoys wine tasting.    1-2 caffeinated drinks a day 0-1 alcoholic never smoker no drug use   Sister Philippe works Adult nurse endoscopy as Charity fundraiser   Social Drivers of Corporate investment banker Strain: Low Risk  (10/01/2023)   Overall Financial Resource Strain (CARDIA)    Difficulty of Paying Living Expenses: Not hard at all  Food Insecurity: No Food Insecurity (10/22/2023)   Hunger Vital Sign    Worried About Running Out of Food in the Last Year: Never true    Ran Out of Food in the Last Year: Never true  Transportation Needs: No Transportation Needs (10/22/2023)   PRAPARE - Administrator, Civil Service (Medical): No    Lack of Transportation (Non-Medical): No  Physical Activity: Unknown (10/01/2023)   Exercise Vital Sign    Days of Exercise per Week: 0 days    Minutes of Exercise per  Session: Not on file  Stress: No Stress Concern Present (10/01/2023)   [employer] of Occupational Health - Occupational Stress Questionnaire    Feeling of Stress : Not at all  Social Connections: Socially Integrated (10/22/2023)   Social Connection and Isolation Panel [NHANES]    Frequency of Communication with Friends and Family: More than three times a week    Frequency of Social Gatherings with Friends and Family: More than three times a week    Attends Religious Services: More than 4 times per year    Active Member of [employer] or Organizations: Yes    Attends Engineer, structural: More than 4 times per year    Marital Status: Married     Review of Systems: A 12 point ROS discussed and pertinent positives are indicated in the HPI above.  All other systems are negative.  Review of Systems  Vital Signs: There were no vitals taken for this visit.  Advance Care Plan: The advanced care plan/surrogate decision maker was discussed at the time of visit and documented in the medical record.    Physical Exam Vitals and nursing note reviewed.  Constitutional:      General: Patient is not in acute distress.    Appearance: Normal appearance. Patient is not ill-appearing.  HENT:     Head: Normocephalic and atraumatic.     Mouth/Throat:     Mouth: Mucous membranes are moist.     Pharynx: Oropharynx is clear.  Cardiovascular:     Rate and Rhythm: Normal rate and regular rhythm.     Pulses: Normal pulses.     Heart sounds: Normal heart sounds.  Pulmonary:     Effort: Pulmonary effort is normal.     Breath sounds: Normal breath sounds.  Abdominal:     General: Abdomen is flat. Bowel  sounds are normal.     Palpations: Abdomen is soft.  Musculoskeletal:     Cervical back: Neck supple.  Left leg w/o swelling, TTP, Redness  Skin:    General: Skin is warm and dry.     Coloration: Skin is not jaundiced or pale.  Neurological:     Mental Status: Patient is alert and oriented  to person, place, and time.  Psychiatric:        Mood and Affect: Mood normal.        Behavior: Behavior normal.        Judgment: Judgment normal.    Imaging: IR Radiologist Eval & Mgmt Result Date: 02/22/2024 EXAM: ESTABLISHED PATIENT OFFICE VISIT CHIEF COMPLAINT: See Epic note. HISTORY OF PRESENT ILLNESS: See Epic note. REVIEW OF SYSTEMS: See Epic note. PHYSICAL EXAMINATION: See Epic note. ASSESSMENT AND PLAN: See Epic note. Ester Sides, MD [facility] Radiology Electronically Signed   By: Ester Sides M.D.   On: 02/22/2024 09:51    Labs:  CBC: Recent Labs    05/21/23 0643 07/16/23 0927 10/22/23 0900 10/23/23 0653  WBC 6.1 6.2 4.3 5.7  HGB 8.7* 12.7 13.1 10.8*  HCT 25.5* 38.2 38.9 31.6*  PLT 265 190 155 153    COAGS: Recent Labs    05/14/23 1930 05/15/23 0146 07/16/23 0927 10/22/23 0900  INR 1.1 1.1 1.1 1.3*  APTT 32 65*  --   --     BMP: Recent Labs    07/16/23 0927 09/15/23 0709 10/22/23 0900 10/23/23 0653  NA 142 138 138 138  K 3.5 3.6 4.0 4.0  CL 112* 112* 110 109  CO2 20* 19* 19* 21*  GLUCOSE 94 110* 105* 121*  BUN 9 9 17 14   CALCIUM  10.3 9.8 10.1 10.3  CREATININE 0.71 0.69 0.62 0.64  GFRNONAA >60 >60 >60 >60    LIVER FUNCTION TESTS: Recent Labs    05/14/23 1749 05/15/23 0146 05/18/23 1129 05/20/23 1245 05/21/23 0645 05/22/23 1104 03/01/24 0921  BILITOT 1.5* 1.3*  --   --   --   --  0.9  AST 26 21  --   --   --   --  25  ALT 42 39  --   --   --   --  33  ALKPHOS 74 74  --   --   --   --  107  PROT 6.5 5.9*  --   --   --   --  6.6  ALBUMIN 3.7 3.0*   < > 2.6* 2.4* 2.6* 4.5   < > = values in this interval not displayed.    TUMOR MARKERS: No results for input(s): AFPTM, CEA, CA199, CHROMGRNA in the last 8760 hours.  Assessment and Plan:  68 yo female with May Thurner syndrome with VTE s/p left femoral-iliac stenting  with revisions in 10/23/23 presents with non-occlusive  recurrent thrombus within the iliac stent system despite anticoagulation/antiplatelet compliance.   Labs pending at 0830 hrs  VS afebrile, HTN - manage per anesthesia team  Patient takes plavix  and eliquis  Abx none     Risks and benefits of image guided left iliac thrombectomy, angioplasty and possible stent revision were discussed with the patient including, but not limited to bleeding, infection, vascular injury, ischemia, or contrast induced renal failure.  This interventional procedure involves the use of X-rays and because of the nature of the planned procedure, it is possible that we will have prolonged use of X-ray fluoroscopy.  Potential radiation risks to you include (but are not limited to) the following: - A slightly elevated risk for cancer  several years later in life. This risk is typically less than 0.5% percent. This risk is low in comparison to the normal incidence of human cancer, which is 33% for women and 50% for men according to the American Cancer Society. - Radiation induced injury can include skin redness, resembling a rash, tissue breakdown / ulcers and hair loss (which can be temporary or permanent).   The likelihood of either of these occurring depends on the difficulty of the procedure and whether you are sensitive to radiation due to previous procedures, disease, or genetic conditions.   IF your procedure requires a prolonged use of radiation, you will be notified and given written instructions for further action.  It is your responsibility to monitor the irradiated area for the 2 weeks following the procedure and to notify your physician if you are concerned that you have suffered a radiation induced injury.    All of the patient's questions were answered, patient is agreeable to proceed.  Consent signed and in chart.  Anticipating discharge home after the procedure and recovery, however patient is aware that she may need overnight observation.   Thank you  for allowing our service to participate in Heather Hudson 's care.    Electronically Signed: Toya VEAR Cousin, PA-C   03/16/2024, 8:30 AM     I spent a total of    25 Minutes in face to face in clinical consultation, greater than 50% of which was counseling/coordinating care for left ileofemoral  stent thrombectomy/revision.   (A copy of this note was sent to the referring provider and the time of visit.)

## 2024-03-16 ENCOUNTER — Encounter (HOSPITAL_COMMUNITY): Payer: Self-pay

## 2024-03-16 ENCOUNTER — Ambulatory Visit (HOSPITAL_COMMUNITY): Payer: Self-pay | Admitting: Physician Assistant

## 2024-03-16 ENCOUNTER — Ambulatory Visit (HOSPITAL_COMMUNITY)
Admission: RE | Admit: 2024-03-16 | Discharge: 2024-03-16 | Disposition: A | Discharge status: Home / Self Care | Attending: Interventional Radiology | Admitting: Interventional Radiology

## 2024-03-16 ENCOUNTER — Emergency Department (HOSPITAL_COMMUNITY)
Admission: EM | Admit: 2024-03-16 | Discharge: 2024-03-16 | Disposition: A | Discharge status: Home / Self Care | Source: Home / Self Care | Attending: Emergency Medicine | Admitting: Emergency Medicine

## 2024-03-16 ENCOUNTER — Ambulatory Visit (HOSPITAL_COMMUNITY): Admission: EM | Admit: 2024-03-16 | Discharge: 2024-03-16 | Disposition: A | Discharge status: Home / Self Care

## 2024-03-16 ENCOUNTER — Other Ambulatory Visit: Payer: Self-pay

## 2024-03-16 ENCOUNTER — Encounter (HOSPITAL_COMMUNITY)
Admission: RE | Disposition: A | Discharge status: Home / Self Care | Payer: Self-pay | Source: Home / Self Care | Attending: Interventional Radiology

## 2024-03-16 ENCOUNTER — Ambulatory Visit (HOSPITAL_COMMUNITY)
Admission: RE | Admit: 2024-03-16 | Discharge: 2024-03-16 | Disposition: A | Discharge status: Home / Self Care | Source: Ambulatory Visit | Attending: Interventional Radiology | Admitting: Interventional Radiology

## 2024-03-16 ENCOUNTER — Encounter (HOSPITAL_COMMUNITY): Payer: Self-pay | Admitting: Interventional Radiology

## 2024-03-16 DIAGNOSIS — L7682 Other postprocedural complications of skin and subcutaneous tissue: Secondary | ICD-10-CM

## 2024-03-16 DIAGNOSIS — Z86718 Personal history of other venous thrombosis and embolism: Secondary | ICD-10-CM | POA: Insufficient documentation

## 2024-03-16 DIAGNOSIS — Z95820 Peripheral vascular angioplasty status with implants and grafts: Secondary | ICD-10-CM | POA: Insufficient documentation

## 2024-03-16 DIAGNOSIS — I10 Essential (primary) hypertension: Secondary | ICD-10-CM | POA: Insufficient documentation

## 2024-03-16 DIAGNOSIS — Z7901 Long term (current) use of anticoagulants: Secondary | ICD-10-CM | POA: Insufficient documentation

## 2024-03-16 DIAGNOSIS — K219 Gastro-esophageal reflux disease without esophagitis: Secondary | ICD-10-CM | POA: Diagnosis not present

## 2024-03-16 DIAGNOSIS — I871 Compression of vein: Secondary | ICD-10-CM | POA: Insufficient documentation

## 2024-03-16 DIAGNOSIS — I97618 Postprocedural hemorrhage and hematoma of a circulatory system organ or structure following other circulatory system procedure: Secondary | ICD-10-CM | POA: Insufficient documentation

## 2024-03-16 DIAGNOSIS — T82868A Thrombosis of vascular prosthetic devices, implants and grafts, initial encounter: Secondary | ICD-10-CM | POA: Diagnosis present

## 2024-03-16 DIAGNOSIS — Y832 Surgical operation with anastomosis, bypass or graft as the cause of abnormal reaction of the patient, or of later complication, without mention of misadventure at the time of the procedure: Secondary | ICD-10-CM | POA: Diagnosis not present

## 2024-03-16 DIAGNOSIS — Z7902 Long term (current) use of antithrombotics/antiplatelets: Secondary | ICD-10-CM | POA: Diagnosis not present

## 2024-03-16 DIAGNOSIS — E119 Type 2 diabetes mellitus without complications: Secondary | ICD-10-CM | POA: Diagnosis not present

## 2024-03-16 DIAGNOSIS — Z01818 Encounter for other preprocedural examination: Secondary | ICD-10-CM

## 2024-03-16 DIAGNOSIS — Z79899 Other long term (current) drug therapy: Secondary | ICD-10-CM | POA: Insufficient documentation

## 2024-03-16 DIAGNOSIS — Z86711 Personal history of pulmonary embolism: Secondary | ICD-10-CM | POA: Insufficient documentation

## 2024-03-16 HISTORY — PX: IR VENO/EXT/UNI LEFT: IMG675

## 2024-03-16 HISTORY — PX: IR US GUIDE VASC ACCESS RIGHT: IMG2390

## 2024-03-16 HISTORY — PX: IR INTRAVASCULAR ULTRASOUND NON CORONARY: IMG6085

## 2024-03-16 HISTORY — PX: IR THROMBECT VENO MECH MOD SED: IMG2300

## 2024-03-16 HISTORY — PX: RADIOLOGY WITH ANESTHESIA: SHX6223

## 2024-03-16 HISTORY — DX: Malignant hyperthermia due to anesthesia, initial encounter: T88.3XXA

## 2024-03-16 HISTORY — DX: Family history of other specified conditions: Z84.89

## 2024-03-16 HISTORY — PX: IR VENOCAVAGRAM IVC: IMG678

## 2024-03-16 LAB — CBC WITH DIFFERENTIAL/PLATELET
Abs Immature Granulocytes: 0.01 10*3/uL (ref 0.00–0.07)
Basophils Absolute: 0 10*3/uL (ref 0.0–0.1)
Basophils Relative: 0 %
Eosinophils Absolute: 0 10*3/uL (ref 0.0–0.5)
Eosinophils Relative: 0 %
HCT: 41.8 % (ref 36.0–46.0)
Hemoglobin: 14.1 g/dL (ref 12.0–15.0)
Immature Granulocytes: 0 %
Lymphocytes Relative: 13 %
Lymphs Abs: 0.7 10*3/uL (ref 0.7–4.0)
MCH: 29.3 pg (ref 26.0–34.0)
MCHC: 33.7 g/dL (ref 30.0–36.0)
MCV: 86.7 fL (ref 80.0–100.0)
Monocytes Absolute: 0.1 10*3/uL (ref 0.1–1.0)
Monocytes Relative: 1 %
Neutro Abs: 4.7 10*3/uL (ref 1.7–7.7)
Neutrophils Relative %: 86 %
Platelets: 151 10*3/uL (ref 150–400)
RBC: 4.82 MIL/uL (ref 3.87–5.11)
RDW: 14 % (ref 11.5–15.5)
WBC: 5.5 10*3/uL (ref 4.0–10.5)
nRBC: 0 % (ref 0.0–0.2)

## 2024-03-16 LAB — BASIC METABOLIC PANEL WITH GFR
Anion gap: 8 (ref 5–15)
Anion gap: 10 (ref 5–15)
BUN: 21 mg/dL (ref 8–23)
BUN: 19 mg/dL (ref 8–23)
CO2: 21 mmol/L — ABNORMAL LOW (ref 22–32)
CO2: 18 mmol/L — ABNORMAL LOW (ref 22–32)
Calcium: 10.4 mg/dL — ABNORMAL HIGH (ref 8.9–10.3)
Calcium: 10.7 mg/dL — ABNORMAL HIGH (ref 8.9–10.3)
Chloride: 112 mmol/L — ABNORMAL HIGH (ref 98–111)
Chloride: 110 mmol/L (ref 98–111)
Creatinine, Ser: 0.66 mg/dL (ref 0.44–1.00)
Creatinine, Ser: 0.82 mg/dL (ref 0.44–1.00)
GFR, Estimated: >60 mL/min (ref >60)
GFR, Estimated: >60 mL/min (ref >60)
Glucose, Bld: 112 mg/dL — ABNORMAL HIGH (ref 70–99)
Glucose, Bld: 187 mg/dL — ABNORMAL HIGH (ref 70–99)
Potassium: 4 mmol/L (ref 3.5–5.1)
Potassium: 4.1 mmol/L (ref 3.5–5.1)
Sodium: 141 mmol/L (ref 135–145)
Sodium: 138 mmol/L (ref 135–145)

## 2024-03-16 LAB — PROTIME-INR
INR: 1.4 — ABNORMAL HIGH (ref 0.8–1.2)
INR: 1.1 (ref 0.8–1.2)
Prothrombin Time: 17 s — ABNORMAL HIGH (ref 11.4–15.2)
Prothrombin Time: 14.7 s (ref 11.4–15.2)

## 2024-03-16 LAB — CBC
HCT: 38.5 % (ref 36.0–46.0)
Hemoglobin: 13.2 g/dL (ref 12.0–15.0)
MCH: 29.3 pg (ref 26.0–34.0)
MCHC: 34.3 g/dL (ref 30.0–36.0)
MCV: 85.4 fL (ref 80.0–100.0)
Platelets: 139 10*3/uL — ABNORMAL LOW (ref 150–400)
RBC: 4.51 MIL/uL (ref 3.87–5.11)
RDW: 14 % (ref 11.5–15.5)
WBC: 4.1 10*3/uL (ref 4.0–10.5)
nRBC: 0 % (ref 0.0–0.2)

## 2024-03-16 LAB — TYPE AND SCREEN
ABO/RH(D): B POS
Antibody Screen: NEGATIVE

## 2024-03-16 LAB — APTT: aPTT: 29 s (ref 24–36)

## 2024-03-16 LAB — ABO/RH: ABO/RH(D): B POS

## 2024-03-16 SURGERY — RADIOLOGY WITH ANESTHESIA
Anesthesia: General

## 2024-03-16 MED ORDER — MIDAZOLAM HCL 2 MG/2ML IJ SOLN: 0.5000 mg | Freq: Once | INTRAMUSCULAR | Status: DC | PRN

## 2024-03-16 MED ORDER — LIDOCAINE-EPINEPHRINE 1 %-1:100000 IJ SOLN
INTRAMUSCULAR | Status: AC
  Filled 2024-03-16: qty 1

## 2024-03-16 MED ORDER — PROPOFOL 10 MG/ML IV BOLUS
INTRAVENOUS | Status: DC | PRN
  Administered 2024-03-16: 100 mg via INTRAVENOUS
  Administered 2024-03-16: 200 mg via INTRAVENOUS
  Administered 2024-03-16: 150 ug/kg/min via INTRAVENOUS

## 2024-03-16 MED ORDER — FENTANYL CITRATE (PF) 100 MCG/2ML IJ SOLN: 25.0000 ug | INTRAMUSCULAR | Status: DC | PRN

## 2024-03-16 MED ORDER — LIDOCAINE 2% (20 MG/ML) 5 ML SYRINGE
INTRAMUSCULAR | Status: DC | PRN
  Administered 2024-03-16: 100 mg via INTRAVENOUS

## 2024-03-16 MED ORDER — LACTATED RINGERS IV SOLN: INTRAVENOUS | Status: DC

## 2024-03-16 MED ORDER — ONDANSETRON HCL 4 MG/2ML IJ SOLN
INTRAMUSCULAR | Status: DC | PRN
  Administered 2024-03-16: 4 mg via INTRAVENOUS

## 2024-03-16 MED ORDER — HEPARIN SODIUM (PORCINE) 1000 UNIT/ML IJ SOLN
INTRAMUSCULAR | Status: DC | PRN
Start: 2024-03-16 — End: 2024-03-16
  Administered 2024-03-16: 8000 [IU] via INTRAVENOUS
  Administered 2024-03-16: 2000 [IU] via INTRAVENOUS

## 2024-03-16 MED ORDER — CHLORHEXIDINE GLUCONATE 0.12 % MT SOLN
15.0000 mL | Freq: Once | OROMUCOSAL | Status: AC
Start: 2024-03-16 — End: 2024-03-16

## 2024-03-16 MED ORDER — LIDOCAINE-EPINEPHRINE (PF) 2 %-1:200000 IJ SOLN
20.0000 mL | Freq: Once | INTRAMUSCULAR | Status: AC
  Administered 2024-03-16: 20 mL
  Filled 2024-03-16: qty 20

## 2024-03-16 MED ORDER — SODIUM CHLORIDE 0.9 % IV SOLN: INTRAVENOUS | Status: DC

## 2024-03-16 MED ORDER — OXYCODONE HCL 5 MG/5ML PO SOLN: 5.0000 mg | Freq: Once | ORAL | Status: DC | PRN

## 2024-03-16 MED ORDER — FENTANYL CITRATE (PF) 100 MCG/2ML IJ SOLN
INTRAMUSCULAR | Status: AC
Start: 2024-03-16 — End: 2024-03-16
  Filled 2024-03-16: qty 2

## 2024-03-16 MED ORDER — LIDOCAINE-EPINEPHRINE-TETRACAINE (LET) TOPICAL GEL: 3.0000 mL | Freq: Once | TOPICAL | Status: DC

## 2024-03-16 MED ORDER — FENTANYL CITRATE (PF) 250 MCG/5ML IJ SOLN
INTRAMUSCULAR | Status: DC | PRN
  Administered 2024-03-16: 50 ug via INTRAVENOUS

## 2024-03-16 MED ORDER — TRANEXAMIC ACID 1000 MG/10ML IV SOLN
1000.0000 mg | Freq: Once | INTRAVENOUS | Status: AC
  Administered 2024-03-16: 1000 mg via TOPICAL

## 2024-03-16 MED ORDER — MIDAZOLAM HCL 2 MG/2ML IJ SOLN
INTRAMUSCULAR | Status: DC | PRN
  Administered 2024-03-16: 2 mg via INTRAVENOUS

## 2024-03-16 MED ORDER — PROPOFOL 1000 MG/100ML IV EMUL
INTRAVENOUS | Status: AC
  Filled 2024-03-16: qty 200

## 2024-03-16 MED ORDER — ROCURONIUM BROMIDE 10 MG/ML (PF) SYRINGE
PREFILLED_SYRINGE | INTRAVENOUS | Status: DC | PRN
  Administered 2024-03-16: 10 mg via INTRAVENOUS
  Administered 2024-03-16: 60 mg via INTRAVENOUS
  Administered 2024-03-16 (×2): 10 mg via INTRAVENOUS

## 2024-03-16 MED ORDER — CHLORHEXIDINE GLUCONATE 0.12 % MT SOLN
OROMUCOSAL | Status: AC
  Administered 2024-03-16: 15 mL via OROMUCOSAL
  Filled 2024-03-16: qty 15

## 2024-03-16 MED ORDER — OXYCODONE HCL 5 MG PO TABS: 5.0000 mg | ORAL_TABLET | Freq: Once | ORAL | Status: DC | PRN

## 2024-03-16 MED ORDER — IODIXANOL 320 MG/ML IV SOLN
100.0000 mL | Freq: Once | INTRAVENOUS | Status: AC | PRN
  Administered 2024-03-16: 55 mL via INTRAVENOUS

## 2024-03-16 MED ORDER — MIDAZOLAM HCL 2 MG/2ML IJ SOLN
INTRAMUSCULAR | Status: AC
  Filled 2024-03-16: qty 2

## 2024-03-16 MED ORDER — LIDOCAINE HCL (PF) 1 % IJ SOLN
30.0000 mL | Freq: Once | INTRAMUSCULAR | Status: DC
  Filled 2024-03-16: qty 30

## 2024-03-16 MED ORDER — DEXAMETHASONE SODIUM PHOSPHATE 10 MG/ML IJ SOLN
INTRAMUSCULAR | Status: DC | PRN
  Administered 2024-03-16: 10 mg via INTRAVENOUS

## 2024-03-16 MED ORDER — MEPERIDINE HCL 25 MG/ML IJ SOLN: 6.2500 mg | INTRAMUSCULAR | Status: DC | PRN

## 2024-03-16 MED ORDER — SUGAMMADEX SODIUM 200 MG/2ML IV SOLN
INTRAVENOUS | Status: DC | PRN
  Administered 2024-03-16: 200 mg via INTRAVENOUS

## 2024-03-16 MED ORDER — ORAL CARE MOUTH RINSE: 15.0000 mL | Freq: Once | OROMUCOSAL | Status: AC

## 2024-03-16 MED ORDER — SODIUM CHLORIDE 0.9 % IV SOLN
0.0125 ug/kg/min | Freq: Once | INTRAVENOUS | Status: AC
  Administered 2024-03-16: .05 ug/kg/min via INTRAVENOUS
  Filled 2024-03-16: qty 1000

## 2024-03-16 MED ORDER — PHENYLEPHRINE HCL-NACL 20-0.9 MG/250ML-% IV SOLN
INTRAVENOUS | Status: AC
  Filled 2024-03-16: qty 250

## 2024-03-16 NOTE — Procedures (Signed)
 Interventional Radiology Procedure Note  Procedure:  1) Left lower extremity venogram 2) Left iliofemoral venous stent mechanical thrombectomy 3) Left iliofemoral stent balloon angioplasty 4) Intravascular ultrasound  Findings: Please refer to procedural dictation for full description. 20 Fr sheath via right internal jugular vein.  Complications: None immediate  Estimated Blood Loss:  < 5 mL  Recommendations: 1 hour bedrest, or as per PACU recommendations. Continue Eliquis , Plavix . Continue LLE compression stockings. IR will arrange for 1 month follow up CTV abdomen/pelvis and clinic follow up.   Creasie Doctor, MD

## 2024-03-16 NOTE — Sedation Documentation (Addendum)
 ACT 130  Repeat in 30 min.

## 2024-03-16 NOTE — ED Notes (Signed)
 Dr. Jinx Mourning at bedside.

## 2024-03-16 NOTE — Progress Notes (Signed)
 Interventional Radiology Brief Note  I was called to the bedside to see Heather Hudson due to continuous bleeding from her right internal jugular access site from earlier today.  Despite conservative measures in the ED, there was still a slow continuous ooze from the incision.  I prepped the right neck with chlorahexadine.  2% lidocaine  with 1:100,000 epinephrine  (approximately 5 mL) was administered for local anesthesia.  The purse string suture was cut and removed.  The bleeding stopped at this point.  I closed the incision with 2 deep dermal 3-0 vicryl sutures.  The area was cleaned then a pressure bandage of quick clot was applied with a tegaderm over.  She tolerated this well.  OK to discharge from IR standpoint.  The patient knows how to reach me with any further concerns.  Thank you to the ED for excellent care for our mutual patient.  Creasie Doctor, MD Pager: [phone]

## 2024-03-16 NOTE — Transfer of Care (Signed)
 Immediate Anesthesia Transfer of Care Note  Patient: Heather Hudson  Procedure(s) Performed: RADIOLOGY WITH ANESTHESIA  Patient Location: PACU  Anesthesia Type:General  Level of Consciousness: awake, alert , and oriented  Airway & Oxygen Therapy: Patient Spontanous Breathing and Patient connected to nasal cannula oxygen  Post-op Assessment: Report given to RN and Post -op Vital signs reviewed and stable  Post vital signs: Reviewed and stable  Last Vitals:  Vitals Value Taken Time  BP 138/74 03/16/24 1417  Temp 36.7 C 03/16/24 1417  Pulse 71 03/16/24 1423  Resp 16 03/16/24 1423  SpO2 99 % 03/16/24 1423  Vitals shown include unfiled device data.  Last Pain:  Vitals:   03/16/24 0919  TempSrc:   PainSc: 0-No pain         Complications: No notable events documented.

## 2024-03-16 NOTE — Anesthesia Procedure Notes (Signed)
 Procedure Name: Intubation Date/Time: 03/16/2024 12:08 PM  Performed by: Jonne Netters, MDPre-anesthesia Checklist: Patient identified, Emergency Drugs available, Suction available and Patient being monitored Patient Re-evaluated:Patient Re-evaluated prior to induction Oxygen Delivery Method: Circle System Utilized Preoxygenation: Pre-oxygenation with 100% oxygen Induction Type: IV induction Ventilation: Mask ventilation without difficulty Laryngoscope Size: Mac and 3 Grade View: Grade II Tube type: Oral Tube size: 7.5 mm Number of attempts: 1 Airway Equipment and Method: Stylet and Oral airway Placement Confirmation: ETT inserted through vocal cords under direct vision, positive ETCO2 and breath sounds checked- equal and bilateral Secured at: 21 cm Tube secured with: Tape Dental Injury: Teeth and Oropharynx as per pre-operative assessment

## 2024-03-16 NOTE — ED Notes (Signed)
 Pt had a procedure today to R IJ; bright red blood noted through bandage to area. Dr.Penna to bedside; large blood clot removed with old bandage, oozing noted to site; pharamcy called to bedside for TXA; hemostatic gauze and txa placed to incision site

## 2024-03-16 NOTE — ED Triage Notes (Signed)
 Pt was getting blood clot procedure today and MD went through right carotid artery. Pt actively bleeding on carotid.

## 2024-03-16 NOTE — ED Triage Notes (Addendum)
 Pt has uncontrollable bleeding from her rt internal jugular site from her procedure today. Ivette Marks, PA at bedside sending pt to ED.

## 2024-03-16 NOTE — ED Provider Notes (Signed)
 Patient presents to urgent care with bleeding from right internal jugular wound she sustained earlier for iliofemoral stent thromboloectomy/ angioplasty. She is prescribed plavix .  Dressing in office is filled with blood, not removed.Recommended further evaluation in the ED immediately. Her husband will transport her next door for further evaluation.    Vernestine Gondola, PA-C 03/16/24 1904

## 2024-03-16 NOTE — Sedation Documentation (Signed)
 Patient transported to PACU with CRNA. Bedside report given to RN and vascular site assessed.

## 2024-03-16 NOTE — Sedation Documentation (Signed)
 ACT 239  Recheck in 30 min

## 2024-03-16 NOTE — Discharge Instructions (Signed)
 You were seen in the emergency department for postoperative bleeding The bleeding did not resolve after compression Dr.Suttle was kind enough to come in and resolve the issue You should follow-up in the office as previously directed Continue taking Eliquis  and your other medications as you have been If bleeding recurs, apply direct pressure for at least 20 minutes and come back to the ED if bleeding does not stop

## 2024-03-16 NOTE — Anesthesia Postprocedure Evaluation (Signed)
 Anesthesia Post Note  Patient: Heather Hudson  Procedure(s) Performed: RADIOLOGY WITH ANESTHESIA     Patient location during evaluation: PACU Anesthesia Type: General Level of consciousness: awake and alert, oriented and patient cooperative Pain management: pain level controlled Vital Signs Assessment: post-procedure vital signs reviewed and stable Respiratory status: spontaneous breathing, nonlabored ventilation and respiratory function stable Cardiovascular status: blood pressure returned to baseline and stable Postop Assessment: no apparent nausea or vomiting, adequate PO intake and able to ambulate Anesthetic complications: no   No notable events documented.  Last Vitals:  Vitals:   03/16/24 1430 03/16/24 1445  BP: (!) 153/73 (!) 156/78  Pulse: 61 65  Resp: 15 16  Temp:  36.7 C  SpO2: 99% 96%    Last Pain:  Vitals:   03/16/24 1445  TempSrc:   PainSc: 0-No pain                 Naven Giambalvo,E. Hermes Wafer

## 2024-03-16 NOTE — ED Notes (Signed)
 Patient is being discharged from the Urgent Care and sent to the Emergency Department via POV . Per Ivette Marks, [place], patient is in need of higher level of care due to bleeding from internal jugular site. Patient is aware and verbalizes understanding of plan of care. There were no vitals filed for this visit.

## 2024-03-16 NOTE — ED Provider Notes (Signed)
 [place] EMERGENCY DEPARTMENT AT [facility] Provider Note   CSN: [id number] Arrival date & time: 03/16/24  1903     History  Chief Complaint  Patient presents with   Post-op Problem    Heather Hudson is a 68 y.o. female.  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  Patient underwent iliofemoral stent thrombectomy/angioplasty earlier today with Dr. Jinx Mourning (interventional radiology).  Access was obtained through the right internal jugular vein with 20 French sheath.  Last dose of Eliquis  was this morning.  Postoperative bleeding from right IJ site was controlled initially however patient coughed and bleeding recurred around 1830 tonight.  She was seen at urgent care and was directed here for further evaluation given concern for significant postoperative bleeding.  Active bleeding upon arrival here despite direct pressure.  No other sites of bleeding  HPI     Home Medications Prior to Admission medications   Medication Sig Start Date End Date Taking? Authorizing Provider  apixaban  (ELIQUIS ) 5 MG TABS tablet Take 1 tablet (5 mg total) by mouth 2 (two) times daily. 11/18/23   Gudena, Vinay, MD  Blood Glucose Monitoring Suppl DEVI 1 each by Does not apply route in the morning, at noon, and at bedtime. May substitute to any manufacturer covered by patient's insurance. 07/27/23   Gabriel John, NP  Calcium  Carb-Cholecalciferol 600-12.5 MG-MCG TABS Take 2 tablets by mouth daily.    [provider]  clopidogrel  (PLAVIX ) 75 MG tablet Take 1 tablet (75 mg total) by mouth daily. 07/16/23 07/10/24  Covington, Jamie R, NP  folic acid  (FOLVITE ) 1 MG tablet Take 2 mg by mouth daily. 09/14/23   [provider]  losartan  (COZAAR ) 25 MG tablet TAKE 1 TABLET BY MOUTH EVERY DAY FOR BLOOD PRESSURE 01/10/24   Clark, Katherine K, NP  omeprazole (PRILOSEC) 20 MG capsule Take 20 mg by mouth daily.     [provider]      Allergies    Metformin  and related, Aspirin, Ibuprofen, Other, and Propofol     Review of Systems   Review of Systems  Physical Exam Updated Vital Signs BP (!) 166/92   Pulse 68   Temp 97.6 F (36.4 C) (Oral)   Resp 16   Ht 5' 8 (1.727 m)   SpO2 97%   BMI 28.28 kg/m  Physical Exam Vitals and nursing note reviewed.  HENT:     Head: Normocephalic and atraumatic.  Eyes:     Pupils: Pupils are equal, round, and reactive to light.  Neck:     Comments: Purse string suture and placed over site of right IJ access Slow bright red oozing from site No expanding hematoma No pulsatile mass Airway intact Cardiovascular:     Rate and Rhythm: Normal rate and regular rhythm.  Pulmonary:     Effort: Pulmonary effort is normal.     Breath sounds: Normal breath sounds.  Abdominal:     Palpations: Abdomen is soft.     Tenderness: There is no abdominal tenderness.  Skin:    General: Skin is warm and dry.  Neurological:     Mental Status: She is alert.  Psychiatric:        Mood and Affect: Mood normal.     ED Results / Procedures / Treatments   Labs (all labs ordered are listed, but only abnormal results are displayed) Labs Reviewed  BASIC METABOLIC PANEL WITH GFR -  Abnormal; Notable for the following components:      Result Value   CO2 18 (*)    Glucose, Bld 187 (*)    Calcium  10.7 (*)    All other components within normal limits  CBC WITH DIFFERENTIAL/PLATELET  PROTIME-INR  APTT  TYPE AND SCREEN  ABO/RH    EKG None  Radiology No results found.  Procedures Procedures    Medications Ordered in ED Medications  tranexamic acid (CYKLOKAPRON) injection 1,000 mg (1,000 mg Topical Given 03/16/24 1928)  lidocaine -EPINEPHrine  (XYLOCAINE  W/EPI) 2 %-1:200000 (PF) injection 20 mL (20 mLs Infiltration Given by Other 03/16/24 2241)    ED Course/ Medical Decision Making/ A&P Clinical Course as of 03/16/24 2319  Wed Mar 16, 2024  1933  Discussed with Dr Jinx Mourning who agrees with plan for direct pressure to monitor bleeding.  Will keep him updated if we are unable to achieve hemostasis or bleeding worsens [MP]  2240 The bleeding did not resolve after multiple attempts at direct pressure with hemostatic dressings.Dr Jinx Mourning was kind enough to come into the ED and revise a postoperative suturing.  Bleeding has resolved and patient has remained hemodynamically stable.  Labs including hemoglobin stable.  Appropriate discharge [MP]    Clinical Course User Index [MP] Sallyanne Creamer, DO                                 Medical Decision Making 68 year old female with history as above presenting for postoperative bleeding after  iliofemoral stent thrombectomy/angioplasty earlier today with Dr. Jinx Mourning (interventional radiology).  Access was obtained through the right internal jugular vein with 20 French sheath.  Based on my initial assessment low suspicion for acute arterial bleeding.  No expanding hematoma or airway involvement.  Slow bleeding from site of purse string suture.  Will obtain laboratory workup including type and screen coagulation studies.  She is on Eliquis  and took her last dose this morning before the procedure.  Significant history of clotting disorders so we want to hold off on reversing her Eliquis  unless absolutely necessary.  Will apply direct pressure with TXA soaked hemostatic gauze and reach out to Dr. Jinx Mourning (IR) who performed the procedure earlier today  Amount and/or Complexity of Data Reviewed Labs: ordered.  Risk Prescription drug management.           Final Clinical Impression(s) / ED Diagnoses Final diagnoses:  Bleeding at insertion site    Rx / DC Orders ED Discharge Orders     None         Sallyanne Creamer, DO 03/16/24 2319

## 2024-03-16 NOTE — Sedation Documentation (Signed)
 Patient moved to table by staff , secured, hooked to monitors, and now under the care of anesthesia. Please see charting in vitals per CRNA.

## 2024-03-17 ENCOUNTER — Ambulatory Visit

## 2024-03-17 ENCOUNTER — Other Ambulatory Visit (HOSPITAL_COMMUNITY): Payer: Self-pay | Admitting: Interventional Radiology

## 2024-03-17 ENCOUNTER — Other Ambulatory Visit: Payer: Self-pay | Admitting: Primary Care

## 2024-03-17 VITALS — BP 165/85 | Ht 68.0 in | Wt 186.0 lb

## 2024-03-17 DIAGNOSIS — Z5320 Procedure and treatment not carried out because of patient's decision for unspecified reasons: Secondary | ICD-10-CM

## 2024-03-17 DIAGNOSIS — Z2821 Immunization not carried out because of patient refusal: Secondary | ICD-10-CM | POA: Diagnosis not present

## 2024-03-17 DIAGNOSIS — Z0000 Encounter for general adult medical examination without abnormal findings: Secondary | ICD-10-CM

## 2024-03-17 DIAGNOSIS — I871 Compression of vein: Secondary | ICD-10-CM

## 2024-03-17 DIAGNOSIS — R928 Other abnormal and inconclusive findings on diagnostic imaging of breast: Secondary | ICD-10-CM

## 2024-03-17 HISTORY — PX: IR ILIAC ART PTA UNI INITIAL MOD SED: IMG2305

## 2024-03-17 LAB — POCT ACTIVATED CLOTTING TIME
Activated Clotting Time: 130 s
Activated Clotting Time: 239 s

## 2024-03-17 NOTE — Patient Instructions (Signed)
 Heather Hudson , Thank you for taking time out of your busy schedule to complete your Annual Wellness Visit with me. I enjoyed our conversation and look forward to speaking with you again next year. I, as well as your care team,  appreciate your ongoing commitment to your health goals. Please review the following plan we discussed and let me know if I can assist you in the future. Your Game plan/ To Do List    Referrals: If you haven't heard from the office you've been referred to, please reach out to them at the phone provided.  None Follow up Visits: Next Medicare AWV with our clinical staff:   03/21/2025 Have you seen your provider in the last 6 months (3 months if uncontrolled diabetes)? No Next Office Visit with your provider: n/a  Clinician Recommendations:  Aim for 30 minutes of exercise or brisk walking, 6-8 glasses of water, and 5 servings of fruits and vegetables each day.       This is a list of the screening recommended for you and due dates:  Health Maintenance  Topic Date Due   Eye exam for diabetics  Never done   Pneumococcal Vaccine for age over 94 (1 of 2 - PCV) Never done   Zoster (Shingles) Vaccine (1 of 2) 06/01/2024*   DTaP/Tdap/Td vaccine (2 - Tdap) 11/17/2024*   Flu Shot  05/06/2024   Yearly kidney health urinalysis for diabetes  08/17/2024   Complete foot exam   08/17/2024   Hemoglobin A1C  09/01/2024   Yearly kidney function blood test for diabetes  03/16/2025   Medicare Annual Wellness Visit  03/17/2025   Mammogram  03/11/2026   Colon Cancer Screening  01/08/2028   DEXA scan (bone density measurement)  Completed   Hepatitis C Screening  Completed   HPV Vaccine  Aged Out   Meningitis B Vaccine  Aged Out   COVID-19 Vaccine  Discontinued  *Topic was postponed. The date shown is not the original due date.    Advanced directives: (In Chart) A copy of your advanced directives are scanned into your chart should your provider ever need it. Advance Care Planning is  important because it:  [x]  Makes sure you receive the medical care that is consistent with your values, goals, and preferences  [x]  It provides guidance to your family and loved ones and reduces their decisional burden about whether or not they are making the right decisions based on your wishes.  Follow the link provided in your after visit summary or read over the paperwork we have mailed to you to help you started getting your Advance Directives in place. If you need assistance in completing these, please reach out to us  so that we can help you!  See attachments for Preventive Care and Fall Prevention Tips.

## 2024-03-17 NOTE — Progress Notes (Signed)
 Because this visit was a virtual/telehealth visit,  certain criteria was not obtained, such a blood pressure, CBG if applicable, and timed get up and go. Any medications not marked as taking were not mentioned during the medication reconciliation part of the visit. Any vitals not documented were not able to be obtained due to this being a telehealth visit or patient was unable to self-report a recent blood pressure reading due to a lack of equipment at home via telehealth. Vitals that have been documented are verbally provided by the patient.   This visit was performed by a medical professional under my direct supervision. I was immediately available for consultation/collaboration. I have reviewed and agree with the Annual Wellness Visit documentation.  Subjective:   Heather Hudson is a 68 y.o. who presents for a Medicare Wellness preventive visit.  As a reminder, Annual Wellness Visits don't include a physical exam, and some assessments may be limited, especially if this visit is performed virtually. We may recommend an in-person follow-up visit with your provider if needed.  Visit Complete: Virtual I connected with  Heather Hudson on 03/17/24 by a audio enabled telemedicine application and verified that I am speaking with the correct person using two identifiers.  Patient Location: Home  Provider Location: Home Office  I discussed the limitations of evaluation and management by telemedicine. The patient expressed understanding and agreed to proceed.  Vital Signs: Because this visit was a virtual/telehealth visit, some criteria may be missing or patient reported. Any vitals not documented were not able to be obtained and vitals that have been documented are patient reported.  VideoDeclined- This patient declined Librarian, academic. Therefore the visit was completed with audio only.  Persons Participating in Visit: Patient.  AWV Questionnaire: The questionnaire  was not completed before appointment Cardiac Risk Factors include: advanced age (>72men, >89 women);dyslipidemia;hypertension;diabetes mellitus     Objective:    Today's Vitals   03/17/24 1027 03/17/24 1028  BP: (!) 165/85   Weight: 186 lb (84.4 kg)   Height: 5' 8 (1.727 m)   PainSc:  0-No pain   Body mass index is 28.28 kg/m.     03/17/2024   10:27 AM 03/10/2024   11:04 AM 10/22/2023   10:00 PM 10/22/2023    9:09 AM 09/15/2023    7:33 AM 07/16/2023    9:33 AM 06/03/2023    8:12 AM  Advanced Directives  Does Patient Have a Medical Advance Directive? Yes Yes Yes Yes Yes Yes Yes  Type of Social research officer, government Power of [employer] Power of [place];Living will Healthcare Power of [place];Living will Healthcare Power of [place];Living will Living will  Does patient want to make changes to medical advance directive? No - Patient declined No - Patient declined No - Patient declined  No - Patient declined  No - Patient declined  Copy of Healthcare Power of Attorney in Chart? No - copy requested No - copy requested   No - copy requested    Would patient like information on creating a medical advance directive? No - Patient declined No - Patient declined     No - Patient declined    Current Medications (verified) Outpatient Encounter Medications as of 03/17/2024  Medication Sig   apixaban  (ELIQUIS ) 5 MG TABS tablet Take 1 tablet (5 mg total) by mouth 2 (two) times daily.   Blood Glucose Monitoring Suppl DEVI 1 each by Does not apply route in the  morning, at noon, and at bedtime. May substitute to any manufacturer covered by patient's insurance.   Calcium  Carb-Cholecalciferol 600-12.5 MG-MCG TABS Take 2 tablets by mouth daily.   clopidogrel  (PLAVIX ) 75 MG tablet Take 1 tablet (75 mg total) by mouth daily.   folic acid  (FOLVITE ) 1 MG tablet Take 2 mg by mouth daily.   losartan  (COZAAR ) 25 MG tablet TAKE 1 TABLET BY  MOUTH EVERY DAY FOR BLOOD PRESSURE   omeprazole (PRILOSEC) 20 MG capsule Take 20 mg by mouth daily.   Facility-Administered Encounter Medications as of 03/17/2024  Medication   Chlorhexidine  Gluconate Cloth 2 % PADS 6 each    Allergies (verified) Metformin  and related, Aspirin, Ibuprofen, Other, and Propofol    History: Past Medical History:  Diagnosis Date   Acute pulmonary embolism (HCC) 05/14/2023   Adenomatous colon polyp 2015   Allergy 1974   teenager, and 2008   Colovesical fistula 2015   COVID-19 06/2020   Environmental and seasonal allergies    Family history of adverse reaction to anesthesia    Sister with Malignant hyperthermia   Hx of adenomatous colonic polyps 01/19/2021   Hypertension 11/23   Low dose prescription   Lower abdominal pain 12/23/2019   Malignant hyperthermia    sister   Mass of soft tissue of abdomen 08/18/2023   May-Thurner syndrome    Migraines    Motion sickness    Persistent cough for 3 weeks or longer 10/10/2021   Pulmonary embolism (HCC) 05/14/2023   Past Surgical History:  Procedure Laterality Date   COLON SURGERY  2016   fistula, small section removed   COLONOSCOPY  2015   Plus others   IR FEM POP ART STENT INC PTA MOD SED  10/22/2023   IR ILIAC ART PTA UNI INITIAL MOD SED  03/17/2024   IR INTRAVASCULAR ULTRASOUND NON CORONARY  05/19/2023   IR INTRAVASCULAR ULTRASOUND NON CORONARY  07/16/2023   IR INTRAVASCULAR ULTRASOUND NON CORONARY  03/16/2024   IR IVC FILTER PLMT / S&I /IMG GUID/MOD SED  05/19/2023   IR IVC FILTER RETRIEVAL / S&I /IMG GUID/MOD SED  10/22/2023   IR IVUS EACH ADDITIONAL NON CORONARY VESSEL  09/15/2023   IR PTA VENOUS EXCEPT DIALYSIS CIRCUIT  05/19/2023   IR PTA VENOUS EXCEPT DIALYSIS CIRCUIT  07/16/2023   IR RADIOLOGIST EVAL & MGMT  07/01/2023   IR RADIOLOGIST EVAL & MGMT  08/21/2023   IR RADIOLOGIST EVAL & MGMT  11/13/2023   IR RADIOLOGIST EVAL & MGMT  02/22/2024   IR THROMBECT VENO MECH MOD SED  05/19/2023   IR  THROMBECT VENO MECH MOD SED  07/16/2023   IR THROMBECT VENO MECH MOD SED  10/22/2023   IR THROMBECT VENO MECH MOD SED  03/16/2024   IR TRANSCATH PLC STENT 1ST ART NOT LE CV CAR VERT CAR  05/19/2023   IR TRANSCATH PLC STENT 1ST ART NOT LE CV CAR VERT CAR  07/16/2023   IR US  GUIDE VASC ACCESS LEFT  05/19/2023   IR US  GUIDE VASC ACCESS LEFT  07/16/2023   IR US  GUIDE VASC ACCESS RIGHT  05/19/2023   IR US  GUIDE VASC ACCESS RIGHT  09/15/2023   IR US  GUIDE VASC ACCESS RIGHT  03/16/2024   IR VENO/EXT/UNI LEFT  05/19/2023   IR VENO/EXT/UNI LEFT  07/16/2023   IR VENO/EXT/UNI LEFT  10/22/2023   IR VENO/EXT/UNI LEFT  03/16/2024   IR VENOCAVAGRAM IVC  05/19/2023   IR VENOCAVAGRAM IVC  07/16/2023   IR VENOCAVAGRAM IVC  09/15/2023   IR VENOCAVAGRAM IVC  10/22/2023   IR VENOCAVAGRAM IVC  03/16/2024   LAPAROSCOPIC LEFT COLON RESECTION  2015   Colovesical fistula, [place]   Left peroneal tendon repair Left 2023   RADIOLOGY WITH ANESTHESIA N/A 10/22/2023   Procedure: LLE venous thrombectomy;  Surgeon: Federico Hopkins, MD;  Location: [facility] OR;  Service: Radiology;  Laterality: N/A;   RADIOLOGY WITH ANESTHESIA N/A 03/16/2024   Procedure: RADIOLOGY WITH ANESTHESIA;  Surgeon: Federico Hopkins, MD;  Location: [facility] OR;  Service: Radiology;  Laterality: N/A;  Left iliofemoral vein stent thrombectomy   WISDOM TOOTH EXTRACTION     Family History  Problem Relation Age of Onset   Heart disease Mother    Diabetes Mother    Hearing loss Mother    Heart disease Father    Prostate cancer Father    Alcohol abuse Father    Cancer Father    COPD Father    Malignant hyperthermia Sister    Pancreatic cancer Maternal Aunt    Diabetes Brother    Cancer Maternal Grandmother    Miscarriages / Stillbirths Maternal Grandmother    Diabetes Brother    Intellectual disability Brother    Learning disabilities Brother    Vision loss Brother    Miscarriages / Stillbirths Sister    Miscarriages / Stillbirths Sister    Colon cancer Neg Hx     Esophageal cancer Neg Hx    Stomach cancer Neg Hx    Liver disease Neg Hx    Breast cancer Neg Hx    Social History   Socioeconomic History   Marital status: Married    Spouse name: Porfirio Bristol   Number of children: Not on file   Years of education: Not on file   Highest education level: Associate degree: occupational, Scientist, product/process development, or vocational program  Occupational History   Not on file  Tobacco Use   Smoking status: Never   Smokeless tobacco: Never   Tobacco comments:    Never smoked  Vaping Use   Vaping status: Never Used  Substance and Sexual Activity   Alcohol use: Yes    Alcohol/week: 5.0 standard drinks of alcohol    Types: 5 Glasses of wine per week   Drug use: Never   Sexual activity: Yes    Birth control/protection: Post-menopausal  Other Topics Concern   Not on file  Social History Narrative   Married.  No children.   Moved from [place] area of [place]  in 2018    Worked in [employer], Insurance account manager.  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   Enjoys wine tasting.    1-2 caffeinated drinks a day 0-1 alcoholic never smoker no drug use   Sister Kingsley Penny works Adult nurse endoscopy as Charity fundraiser   Social Drivers of Corporate investment banker Strain: Low Risk  (03/17/2024)   Overall Financial Resource Strain (CARDIA)    Difficulty of Paying Living Expenses: Not hard at all  Food Insecurity: No Food Insecurity (03/17/2024)   Hunger Vital Sign    Worried About Running Out of Food in the Last Year: Never true    Ran Out of Food in the Last Year: Never true  Transportation Needs: No Transportation Needs (03/17/2024)   PRAPARE - Administrator, Civil Service (Medical): No    Lack of Transportation (Non-Medical): No  Physical Activity: Insufficiently Active (03/17/2024)   Exercise Vital Sign  Days of Exercise per Week: 1 day    Minutes of Exercise per Session: 30 min  Stress: No  Stress Concern Present (03/17/2024)   [employer] of Occupational Health - Occupational Stress Questionnaire    Feeling of Stress: Not at all  Social Connections: Socially Integrated (03/17/2024)   Social Connection and Isolation Panel    Frequency of Communication with Friends and Family: More than three times a week    Frequency of Social Gatherings with Friends and Family: More than three times a week    Attends Religious Services: More than 4 times per year    Active Member of [employer] or Organizations: Yes    Attends Engineer, structural: More than 4 times per year    Marital Status: Married    Tobacco Counseling Counseling given: Not Answered Tobacco comments: Never smoked    Clinical Intake:  Pre-visit preparation completed: Yes  Pain : No/denies pain Pain Score: 0-No pain     BMI - recorded: 28.28 Nutritional Status: BMI 25 -29 Overweight Nutritional Risks: None Diabetes: No  Lab Results  Component Value Date   HGBA1C 5.2 03/01/2024   HGBA1C 4.9 11/18/2023   HGBA1C 4.8 08/18/2023     How often do you need to have someone help you when you read instructions, pamphlets, or other written materials from your doctor or pharmacy?: 1 - Never  Interpreter Needed?: No  Information entered by :: Juliann Ochoa   Activities of Daily Living     03/17/2024   10:31 AM 03/16/2024    9:22 AM  In your present state of health, do you have any difficulty performing the following activities:  Hearing? 0   Vision? 0   Difficulty concentrating or making decisions? 0   Walking or climbing stairs? 0   Dressing or bathing? 0   Doing errands, shopping? 0 0  Preparing Food and eating ? N   Using the Toilet? N   In the past six months, have you accidently leaked urine? N   Do you have problems with loss of bowel control? N   Managing your Medications? N   Managing your Finances? N   Housekeeping or managing your Housekeeping? N     Patient Care  Team: Gabriel John, NP as PCP - General (Internal Medicine)  I have updated your Care Teams any recent Medical Services you may have received from other providers in the past year.     Assessment:   This is a routine wellness examination for Zoie.  Hearing/Vision screen Hearing Screening - Comments:: Patient has no hearing difficulties  Vision Screening - Comments:: Patient wears otc readers   Goals Addressed             This Visit's Progress    Patient Stated       To get more active        Depression Screen     03/17/2024   10:32 AM 03/01/2024    9:02 AM 11/18/2023    9:36 AM 08/18/2023    2:53 PM 01/20/2023    7:57 AM 01/16/2022    8:33 AM 10/10/2021    8:42 AM  PHQ 2/9 Scores  PHQ - 2 Score 0 0 0 0 0 0 0  PHQ- 9 Score 0   2  0 0    Fall Risk     03/17/2024   10:29 AM 03/01/2024    9:01 AM 11/18/2023    9:36 AM 08/18/2023    2:53  PM 05/05/2023    9:16 AM  Fall Risk   Falls in the past year? 0 0 0 0 0  Number falls in past yr: 0 0 0 0 0  Injury with Fall? 0 0 0 0 0  Risk for fall due to : No Fall Risks No Fall Risks No Fall Risks No Fall Risks No Fall Risks  Follow up Falls evaluation completed Falls evaluation completed Falls evaluation completed Falls evaluation completed Falls evaluation completed    MEDICARE RISK AT HOME:  Medicare Risk at Home Any stairs in or around the home?: No If so, are there any without handrails?: No Home free of loose throw rugs in walkways, pet beds, electrical cords, etc?: Yes Adequate lighting in your home to reduce risk of falls?: Yes Life alert?: No Use of a cane, walker or w/c?: No Grab bars in the bathroom?: No Shower chair or bench in shower?: Yes Elevated toilet seat or a handicapped toilet?: No  TIMED UP AND GO:  Was the test performed?  No  Cognitive Function: 6CIT completed        03/17/2024   10:29 AM 01/20/2023    7:58 AM  6CIT Screen  What Year? 0 points 0 points  What month? 0 points 0 points   What time? 0 points 0 points  Count back from 20 0 points 0 points  Months in reverse 0 points 0 points  Repeat phrase 0 points 0 points  Total Score 0 points 0 points    Immunizations Immunization History  Administered Date(s) Administered   Influenza,inj,Quad PF,6+ Mos 09/07/2018   Influenza-Unspecified 07/20/2016, 09/07/2018   Td 10/06/2013    Screening Tests Health Maintenance  Topic Date Due   OPHTHALMOLOGY EXAM  Never done   Pneumococcal Vaccine: 50+ Years (1 of 2 - PCV) Never done   Zoster Vaccines- Shingrix (1 of 2) 06/01/2024 (Originally 06/12/1975)   DTaP/Tdap/Td (2 - Tdap) 11/17/2024 (Originally 10/07/2023)   INFLUENZA VACCINE  05/06/2024   Diabetic kidney evaluation - Urine ACR  08/17/2024   FOOT EXAM  08/17/2024   HEMOGLOBIN A1C  09/01/2024   Diabetic kidney evaluation - eGFR measurement  03/16/2025   Medicare Annual Wellness (AWV)  03/17/2025   MAMMOGRAM  03/11/2026   Colonoscopy  01/08/2028   DEXA SCAN  Completed   Hepatitis C Screening  Completed   HPV VACCINES  Aged Out   Meningococcal B Vaccine  Aged Out   COVID-19 Vaccine  Discontinued    Health Maintenance  Health Maintenance Due  Topic Date Due   OPHTHALMOLOGY EXAM  Never done   Pneumococcal Vaccine: 50+ Years (1 of 2 - PCV) Never done   Health Maintenance Items Addressed:   Additional Screening:  Vision Screening: Recommended annual ophthalmology exams for early detection of glaucoma and other disorders of the eye. Would you like a referral to an eye doctor? No    Dental Screening: Recommended annual dental exams for proper oral hygiene  Community Resource Referral / Chronic Care Management: CRR required this visit?  No   CCM required this visit?  No   Plan:    I have personally reviewed and noted the following in the patient's chart:   Medical and social history Use of alcohol, tobacco or illicit drugs  Current medications and supplements including opioid prescriptions. Patient  is not currently taking opioid prescriptions. Functional ability and status Nutritional status Physical activity Advanced directives List of other physicians Hospitalizations, surgeries, and ER visits in previous 12 months  Vitals Screenings to include cognitive, depression, and falls Referrals and appointments  In addition, I have reviewed and discussed with patient certain preventive protocols, quality metrics, and best practice recommendations. A written personalized care plan for preventive services as well as general preventive health recommendations were provided to patient.   Freeda Jerry, [place]   03/17/2024   After Visit Summary: (MyChart) Due to this being a telephonic visit, the after visit summary with patients personalized plan was offered to patient via MyChart   Notes: Nothing significant to report at this time.

## 2024-03-18 ENCOUNTER — Telehealth (HOSPITAL_COMMUNITY): Payer: Self-pay | Admitting: Student

## 2024-03-18 DIAGNOSIS — I871 Compression of vein: Secondary | ICD-10-CM

## 2024-03-18 NOTE — Telephone Encounter (Signed)
 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 Patient discharged home the same day but returned to the ED due to bleeding from the [facility] vascular site. Dr. Jinx Mourning met the patient in the ED and placed a new suture into the site.   Patient contacted today for follow up and she reports no further bleeding. She feels well and has no complaints. She will follow up with Dr. Jinx Mourning in one month and will have repeat imaging obtained (CTV Abdomen/pelvis).  She knows she can call the IR clinic with any questions or concerns prior to her follow up visit with Dr. Jinx Mourning.  Rebekka Lobello, AGACNP-BC 03/18/2024, 12:19 PM

## 2024-03-29 ENCOUNTER — Ambulatory Visit: Payer: Self-pay | Admitting: Primary Care

## 2024-03-29 ENCOUNTER — Ambulatory Visit
Admission: RE | Admit: 2024-03-29 | Discharge: 2024-03-29 | Disposition: A | Discharge status: Home / Self Care | Source: Ambulatory Visit | Attending: Primary Care | Admitting: Primary Care

## 2024-03-29 DIAGNOSIS — R928 Other abnormal and inconclusive findings on diagnostic imaging of breast: Secondary | ICD-10-CM

## 2024-04-14 ENCOUNTER — Other Ambulatory Visit: Payer: Self-pay | Admitting: Primary Care

## 2024-04-14 ENCOUNTER — Other Ambulatory Visit: Payer: Self-pay | Admitting: Interventional Radiology

## 2024-04-14 DIAGNOSIS — I871 Compression of vein: Secondary | ICD-10-CM

## 2024-04-14 DIAGNOSIS — I10 Essential (primary) hypertension: Secondary | ICD-10-CM

## 2024-04-22 ENCOUNTER — Ambulatory Visit
Admission: RE | Admit: 2024-04-22 | Discharge: 2024-04-22 | Disposition: A | Discharge status: Home / Self Care | Source: Ambulatory Visit | Attending: Interventional Radiology | Admitting: Interventional Radiology

## 2024-04-22 ENCOUNTER — Inpatient Hospital Stay: Admission: RE | Admit: 2024-04-22 | Source: Ambulatory Visit

## 2024-04-22 DIAGNOSIS — I871 Compression of vein: Secondary | ICD-10-CM

## 2024-04-22 MED ORDER — IOPAMIDOL (ISOVUE-300) INJECTION 61%
100.0000 mL | Freq: Once | INTRAVENOUS | Status: AC | PRN
  Administered 2024-04-22: 100 mL via INTRAVENOUS

## 2024-05-01 NOTE — Progress Notes (Signed)
 This encounter was conducted via the [employer] providing interactive audio and visual communication.  The patient provided verbal consent to conduct a virtual appointment.  The patient was located at their primary residence during this encounter.  Referring Physician(s): Dr. Leatrice Chapel, Dr. 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  She unfortunately experienced in-stent thrombus and therefore the stents were recanalized and revised, extending into the left common femoral vein on 10/22/23.  The IVC filter was removed at this time. She was started on Lovenox  and discharged home the next day. She has been doing well since the procedure but has experienced shortness of breath. She continues to work with PT due to her left ankle. She has remained compliant with Lovenox  and runs out next week. She states that her left lower extremity feels great and is symmetric in size to the right lower extremity. No pain or swelling.    Imaging at the time of our last follow up showed stent patency and we discussed her anticoagulation regimen including completing her lovenox  therapy, converting back to Eliquis  and continuing Plavix  every day. She was encouraged to use a LLE compression hose and we made plans for repeat imaging and a clinic visit in 3 months.  CT chest and CT venogram abdomen/pelvis were completed 02/08/24. She presents today via virtual video visit for follow up. She endorses increased left leg swelling and cramping in her calf and ankle. This has been mildly  progressive over the past month. No shortness of breath. She has been compliant with Eliquis  and Plavix .  Her CT chest and CT venogram abdomen/pelvis 02/08/24 demonstrated non-occlusive recurrent thrombus within the iliac stent system. I recommended a stent thrombectomy/revision with general anesthesia and she was agreeable to proceed. She was advised to continue Eliquis  and Plavix  and I also reached out to Dr. Gudena to discuss alternative therapy.   She presented to the [facility] IR 03/16/24 and underwent left lower extremity venogram with stent thrombectomy and revision. She tolerated the procedure well and was discharged home the same day. We made plans to follow up in one month with CTV abdomen/pelvis and clinic visit. She was instructed to continue Eliquis , Plavix  and LLE compression stocking.   She unfortunately presented to the ED the evening of her procedure due to bleeding from the [facility] vascular site. I evaluated her in the ED and placed a new suture at the site. Our team followed up with her a few days later and she was doing well with no further bleeding from the site.   She presents today for follow up via virtual video visit.   Past Medical History:  Diagnosis Date   Acute pulmonary embolism (HCC) 05/14/2023   Adenomatous colon polyp 2015   Allergy 1974   teenager, and 2008   Colovesical fistula 2015   COVID-19 06/2020   Environmental and seasonal allergies    Family history of adverse reaction to anesthesia    Sister with Malignant hyperthermia   Hx of adenomatous colonic polyps 01/19/2021   Hypertension 11/23   Low dose prescription   Lower abdominal pain 12/23/2019  Malignant hyperthermia    sister   Mass of soft tissue of abdomen 08/18/2023   May-Thurner syndrome    Migraines    Motion sickness    Persistent cough for 3 weeks or longer 10/10/2021   Pulmonary embolism (HCC) 05/14/2023    Past Surgical History:  Procedure Laterality Date   COLON SURGERY  2016    fistula, small section removed   COLONOSCOPY  2015   Plus others   IR FEM POP ART STENT INC PTA MOD SED  10/22/2023   IR ILIAC ART PTA UNI INITIAL MOD SED  03/17/2024   IR INTRAVASCULAR ULTRASOUND NON CORONARY  05/19/2023   IR INTRAVASCULAR ULTRASOUND NON CORONARY  07/16/2023   IR INTRAVASCULAR ULTRASOUND NON CORONARY  03/16/2024   IR IVC FILTER PLMT / S&I /IMG GUID/MOD SED  05/19/2023   IR IVC FILTER RETRIEVAL / S&I /IMG GUID/MOD SED  10/22/2023   IR IVUS EACH ADDITIONAL NON CORONARY VESSEL  09/15/2023   IR PTA VENOUS EXCEPT DIALYSIS CIRCUIT  05/19/2023   IR PTA VENOUS EXCEPT DIALYSIS CIRCUIT  07/16/2023   IR RADIOLOGIST EVAL & MGMT  07/01/2023   IR RADIOLOGIST EVAL & MGMT  08/21/2023   IR RADIOLOGIST EVAL & MGMT  11/13/2023   IR RADIOLOGIST EVAL & MGMT  02/22/2024   IR THROMBECT VENO MECH MOD SED  05/19/2023   IR THROMBECT VENO MECH MOD SED  07/16/2023   IR THROMBECT VENO MECH MOD SED  10/22/2023   IR THROMBECT VENO MECH MOD SED  03/16/2024   IR TRANSCATH PLC STENT 1ST ART NOT LE CV CAR VERT CAR  05/19/2023   IR TRANSCATH PLC STENT 1ST ART NOT LE CV CAR VERT CAR  07/16/2023   IR US  GUIDE VASC ACCESS LEFT  05/19/2023   IR US  GUIDE VASC ACCESS LEFT  07/16/2023   IR US  GUIDE VASC ACCESS RIGHT  05/19/2023   IR US  GUIDE VASC ACCESS RIGHT  09/15/2023   IR US  GUIDE VASC ACCESS RIGHT  03/16/2024   IR VENO/EXT/UNI LEFT  05/19/2023   IR VENO/EXT/UNI LEFT  07/16/2023   IR VENO/EXT/UNI LEFT  10/22/2023   IR VENO/EXT/UNI LEFT  03/16/2024   IR VENOCAVAGRAM IVC  05/19/2023   IR VENOCAVAGRAM IVC  07/16/2023   IR VENOCAVAGRAM IVC  09/15/2023   IR VENOCAVAGRAM IVC  10/22/2023   IR VENOCAVAGRAM IVC  03/16/2024   LAPAROSCOPIC LEFT COLON RESECTION  2015   Colovesical fistula, [place]   Left peroneal tendon repair Left 2023   RADIOLOGY WITH ANESTHESIA N/A 10/22/2023   Procedure: LLE venous thrombectomy;  Surgeon: Jennefer Ester PARAS, MD;  Location: [facility] OR;  Service: Radiology;  Laterality: N/A;   RADIOLOGY WITH ANESTHESIA  N/A 03/16/2024   Procedure: RADIOLOGY WITH ANESTHESIA;  Surgeon: Jennefer Ester PARAS, MD;  Location: [facility] OR;  Service: Radiology;  Laterality: N/A;  Left iliofemoral vein stent thrombectomy   WISDOM TOOTH EXTRACTION      Allergies: Metformin  and related, Aspirin, Ibuprofen, Other, and Propofol   Medications: Prior to Admission medications   Medication Sig Start Date End Date Taking? Authorizing Provider  apixaban  (ELIQUIS ) 5 MG TABS tablet Take 1 tablet (5 mg total) by mouth 2 (two) times daily. 11/18/23   Gudena, Vinay, MD  Blood Glucose Monitoring Suppl DEVI 1 each by Does not apply route in the morning, at noon, and at bedtime. May substitute to any manufacturer covered by patient's insurance. 07/27/23   Gretta Comer POUR, NP  Calcium  Carb-Cholecalciferol 600-12.5 MG-MCG TABS Take 2 tablets  by mouth daily.    [provider]  clopidogrel  (PLAVIX ) 75 MG tablet Take 1 tablet (75 mg total) by mouth daily. 07/16/23 07/10/24  Covington, Jamie R, NP  folic acid  (FOLVITE ) 1 MG tablet Take 2 mg by mouth daily. 09/14/23   [provider]  losartan  (COZAAR ) 25 MG tablet TAKE 1 TABLET BY MOUTH EVERY DAY FOR BLOOD PRESSURE 04/14/24   Clark, Katherine K, NP  omeprazole (PRILOSEC) 20 MG capsule Take 20 mg by mouth daily.    [provider]     Family History  Problem Relation Age of Onset   Heart disease Mother    Diabetes Mother    Hearing loss Mother    Heart disease Father    Prostate cancer Father    Alcohol abuse Father    Cancer Father    COPD Father    Malignant hyperthermia Sister    Pancreatic cancer Maternal Aunt    Diabetes Brother    Cancer Maternal Grandmother    Miscarriages / Stillbirths Maternal Grandmother    Diabetes Brother    Intellectual disability Brother    Learning disabilities Brother    Vision loss Brother    Miscarriages / Stillbirths Sister    Miscarriages / Stillbirths Sister    Colon cancer Neg Hx    Esophageal cancer Neg Hx    Stomach  cancer Neg Hx    Liver disease Neg Hx    Breast cancer Neg Hx     Social History   Socioeconomic History   Marital status: Married    Spouse name: Lamar   Number of children: Not on file   Years of education: Not on file   Highest education level: Associate degree: occupational, Scientist, product/process development, or vocational program  Occupational History   Not on file  Tobacco Use   Smoking status: Never   Smokeless tobacco: Never   Tobacco comments:    Never smoked  Vaping Use   Vaping status: Never Used  Substance and Sexual Activity   Alcohol use: Yes    Alcohol/week: 5.0 standard drinks of alcohol    Types: 5 Glasses of wine per week   Drug use: Never   Sexual activity: Yes    Birth control/protection: Post-menopausal  Other Topics Concern   Not on file  Social History Narrative   Married.  No children.   Moved from [place] area of [place]  in 2018    Worked in [employer], Insurance account manager.  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   Enjoys wine tasting.    1-2 caffeinated drinks a day 0-1 alcoholic never smoker no drug use   Sister Philippe works Adult nurse endoscopy as Charity fundraiser   Social Drivers of Corporate investment banker Strain: Low Risk  (03/17/2024)   Overall Financial Resource Strain (CARDIA)    Difficulty of Paying Living Expenses: Not hard at all  Food Insecurity: No Food Insecurity (03/17/2024)   Hunger Vital Sign    Worried About Running Out of Food in the Last Year: Never true    Ran Out of Food in the Last Year: Never true  Transportation Needs: No Transportation Needs (03/17/2024)   PRAPARE - Administrator, Civil Service (Medical): No    Lack of Transportation (Non-Medical): No  Physical Activity: Insufficiently Active (03/17/2024)   Exercise Vital Sign    Days of Exercise per Week: 1 day    Minutes  of Exercise per Session: 30 min  Stress: No Stress Concern Present (03/17/2024)    [employer] of Occupational Health - Occupational Stress Questionnaire    Feeling of Stress: Not at all  Social Connections: Socially Integrated (03/17/2024)   Social Connection and Isolation Panel    Frequency of Communication with Friends and Family: More than three times a week    Frequency of Social Gatherings with Friends and Family: More than three times a week    Attends Religious Services: More than 4 times per year    Active Member of [employer] or Organizations: Yes    Attends Engineer, structural: More than 4 times per year    Marital Status: Married     Vital Signs: There were no vitals taken for this visit.  Physical Exam  Patient is alert, oriented and able to participate fully in the conversation. No apparent discomfort or distress observed. She appears appropriately dressed.   Imaging: CT Chest 11/04/23  Evolving pulmonary infarct, sequela of prior PE   CT AP 11/04/23  Patent left femoral - iliac stents.  Small focal wall-adherent non-occlusive IVC thrombus persists   CTV 02/08/24 Recurrent wall-adherent, non-occlusive thrombus within the genu of the iliac stent system.  Poor venous enhancement bolus capture.  Labs:  CBC: Recent Labs    10/22/23 0900 10/23/23 0653 03/16/24 0852 03/16/24 1936  WBC 4.3 5.7 4.1 5.5  HGB 13.1 10.8* 13.2 14.1  HCT 38.9 31.6* 38.5 41.8  PLT 155 153 139* 151    COAGS: Recent Labs    05/14/23 1930 05/15/23 0146 07/16/23 0927 10/22/23 0900 03/16/24 0852 03/16/24 1936  INR 1.1 1.1 1.1 1.3* 1.4* 1.1  APTT 32 65*  --   --   --  29    BMP: Recent Labs    10/22/23 0900 10/23/23 0653 03/16/24 0852 03/16/24 1936  NA 138 138 141 138  K 4.0 4.0 4.0 4.1  CL 110 109 112* 110  CO2 19* 21* 21* 18*  GLUCOSE 105* 121* 112* 187*  BUN 17 14 21 19   CALCIUM  10.1 10.3 10.4* 10.7*  CREATININE 0.62 0.64 0.66 0.82  GFRNONAA >60 >60 >60 >60    LIVER FUNCTION TESTS: Recent Labs    05/14/23 1749 05/15/23 0146  05/18/23 1129 05/20/23 1245 05/21/23 0645 05/22/23 1104 03/01/24 0921  BILITOT 1.5* 1.3*  --   --   --   --  0.9  AST 26 21  --   --   --   --  25  ALT 42 39  --   --   --   --  33  ALKPHOS 74 74  --   --   --   --  107  PROT 6.5 5.9*  --   --   --   --  6.6  ALBUMIN 3.7 3.0*   < > 2.6* 2.4* 2.6* 4.5   < > = values in this interval not displayed.    Assessment and Plan:  68 year old female with history of May Thurner syndrome with VTE now status post left femoral --> iliac stenting with revisions 10/23/23 and 03/16/24.  Ester Sides, MD Pager: [phone]     I spent a total of 25 Minutes in virtual video clinical consultation, greater than 50% of which was counseling/coordinating care for May-Thurner Syndrome.

## 2024-05-02 ENCOUNTER — Inpatient Hospital Stay
Admission: RE | Admit: 2024-05-02 | Discharge: 2024-05-02 | Disposition: A | Discharge status: Home / Self Care | Source: Ambulatory Visit | Attending: Student

## 2024-05-02 DIAGNOSIS — I871 Compression of vein: Secondary | ICD-10-CM

## 2024-05-02 HISTORY — PX: IR RADIOLOGIST EVAL & MGMT: IMG5224

## 2024-05-26 DIAGNOSIS — L121 Cicatricial pemphigoid: Secondary | ICD-10-CM | POA: Diagnosis not present

## 2024-05-26 DIAGNOSIS — B379 Candidiasis, unspecified: Secondary | ICD-10-CM | POA: Diagnosis not present

## 2024-07-06 DIAGNOSIS — I10 Essential (primary) hypertension: Secondary | ICD-10-CM | POA: Diagnosis not present

## 2024-07-06 DIAGNOSIS — I82221 Chronic embolism and thrombosis of inferior vena cava: Secondary | ICD-10-CM | POA: Diagnosis not present

## 2024-07-06 DIAGNOSIS — E663 Overweight: Secondary | ICD-10-CM | POA: Diagnosis not present

## 2024-07-06 DIAGNOSIS — K219 Gastro-esophageal reflux disease without esophagitis: Secondary | ICD-10-CM | POA: Diagnosis not present

## 2024-07-06 DIAGNOSIS — E785 Hyperlipidemia, unspecified: Secondary | ICD-10-CM | POA: Diagnosis not present

## 2024-07-12 ENCOUNTER — Emergency Department (HOSPITAL_BASED_OUTPATIENT_CLINIC_OR_DEPARTMENT_OTHER)

## 2024-07-12 ENCOUNTER — Encounter (HOSPITAL_BASED_OUTPATIENT_CLINIC_OR_DEPARTMENT_OTHER): Payer: Self-pay

## 2024-07-12 ENCOUNTER — Emergency Department (HOSPITAL_BASED_OUTPATIENT_CLINIC_OR_DEPARTMENT_OTHER): Admitting: Radiology

## 2024-07-12 ENCOUNTER — Other Ambulatory Visit: Payer: Self-pay

## 2024-07-12 ENCOUNTER — Observation Stay (HOSPITAL_BASED_OUTPATIENT_CLINIC_OR_DEPARTMENT_OTHER)
Admission: EM | Admit: 2024-07-12 | Discharge: 2024-07-14 | Disposition: A | Discharge status: Home / Self Care | Attending: Family Medicine | Admitting: Family Medicine

## 2024-07-12 DIAGNOSIS — M5030 Other cervical disc degeneration, unspecified cervical region: Secondary | ICD-10-CM | POA: Diagnosis not present

## 2024-07-12 DIAGNOSIS — I82812 Embolism and thrombosis of superficial veins of left lower extremities: Secondary | ICD-10-CM | POA: Diagnosis not present

## 2024-07-12 DIAGNOSIS — K219 Gastro-esophageal reflux disease without esophagitis: Secondary | ICD-10-CM | POA: Diagnosis not present

## 2024-07-12 DIAGNOSIS — I10 Essential (primary) hypertension: Secondary | ICD-10-CM | POA: Diagnosis not present

## 2024-07-12 DIAGNOSIS — S065X0A Traumatic subdural hemorrhage without loss of consciousness, initial encounter: Secondary | ICD-10-CM | POA: Diagnosis not present

## 2024-07-12 DIAGNOSIS — R918 Other nonspecific abnormal finding of lung field: Secondary | ICD-10-CM | POA: Diagnosis not present

## 2024-07-12 DIAGNOSIS — S0101XA Laceration without foreign body of scalp, initial encounter: Secondary | ICD-10-CM | POA: Insufficient documentation

## 2024-07-12 DIAGNOSIS — S0003XA Contusion of scalp, initial encounter: Secondary | ICD-10-CM | POA: Diagnosis not present

## 2024-07-12 DIAGNOSIS — I82402 Acute embolism and thrombosis of unspecified deep veins of left lower extremity: Principal | ICD-10-CM | POA: Insufficient documentation

## 2024-07-12 DIAGNOSIS — N281 Cyst of kidney, acquired: Secondary | ICD-10-CM | POA: Diagnosis not present

## 2024-07-12 DIAGNOSIS — I700 Atherosclerosis of aorta: Secondary | ICD-10-CM | POA: Insufficient documentation

## 2024-07-12 DIAGNOSIS — S065XAA Traumatic subdural hemorrhage with loss of consciousness status unknown, initial encounter: Secondary | ICD-10-CM | POA: Diagnosis present

## 2024-07-12 DIAGNOSIS — R609 Edema, unspecified: Secondary | ICD-10-CM | POA: Diagnosis not present

## 2024-07-12 DIAGNOSIS — M79605 Pain in left leg: Secondary | ICD-10-CM | POA: Diagnosis not present

## 2024-07-12 DIAGNOSIS — Z043 Encounter for examination and observation following other accident: Secondary | ICD-10-CM | POA: Diagnosis not present

## 2024-07-12 DIAGNOSIS — I6201 Nontraumatic acute subdural hemorrhage: Secondary | ICD-10-CM | POA: Diagnosis not present

## 2024-07-12 DIAGNOSIS — R162 Hepatomegaly with splenomegaly, not elsewhere classified: Secondary | ICD-10-CM | POA: Diagnosis not present

## 2024-07-12 DIAGNOSIS — M898X8 Other specified disorders of bone, other site: Secondary | ICD-10-CM | POA: Diagnosis not present

## 2024-07-12 DIAGNOSIS — W0110XA Fall on same level from slipping, tripping and stumbling with subsequent striking against unspecified object, initial encounter: Secondary | ICD-10-CM | POA: Diagnosis not present

## 2024-07-12 DIAGNOSIS — I871 Compression of vein: Secondary | ICD-10-CM | POA: Diagnosis not present

## 2024-07-12 DIAGNOSIS — W19XXXA Unspecified fall, initial encounter: Principal | ICD-10-CM

## 2024-07-12 DIAGNOSIS — I82412 Acute embolism and thrombosis of left femoral vein: Secondary | ICD-10-CM | POA: Diagnosis not present

## 2024-07-12 DIAGNOSIS — K760 Fatty (change of) liver, not elsewhere classified: Secondary | ICD-10-CM | POA: Diagnosis not present

## 2024-07-12 DIAGNOSIS — M79644 Pain in right finger(s): Secondary | ICD-10-CM | POA: Diagnosis not present

## 2024-07-12 LAB — CBC
HCT: 38.2 % (ref 36.0–46.0)
Hemoglobin: 13.3 g/dL (ref 12.0–15.0)
MCH: 30.3 pg (ref 26.0–34.0)
MCHC: 34.8 g/dL (ref 30.0–36.0)
MCV: 87 fL (ref 80.0–100.0)
Platelets: 142 K/uL — ABNORMAL LOW (ref 150–400)
RBC: 4.39 MIL/uL (ref 3.87–5.11)
RDW: 13.3 % (ref 11.5–15.5)
WBC: 6 K/uL (ref 4.0–10.5)
nRBC: 0 % (ref 0.0–0.2)

## 2024-07-12 LAB — BASIC METABOLIC PANEL WITH GFR
Anion gap: 14 (ref 5–15)
BUN: 14 mg/dL (ref 8–23)
CO2: 20 mmol/L — ABNORMAL LOW (ref 22–32)
Calcium: 11.3 mg/dL — ABNORMAL HIGH (ref 8.9–10.3)
Chloride: 103 mmol/L (ref 98–111)
Creatinine, Ser: 0.82 mg/dL (ref 0.44–1.00)
GFR, Estimated: >60 mL/min (ref >60)
Glucose, Bld: 112 mg/dL — ABNORMAL HIGH (ref 70–99)
Potassium: 3.7 mmol/L (ref 3.5–5.1)
Sodium: 137 mmol/L (ref 135–145)

## 2024-07-12 LAB — HEPARIN LEVEL (UNFRACTIONATED): Heparin Unfractionated: >1.1 [IU]/mL — ABNORMAL HIGH (ref 0.30–0.70)

## 2024-07-12 MED ORDER — IOHEXOL 350 MG/ML SOLN
100.0000 mL | Freq: Once | INTRAVENOUS | Status: AC | PRN
  Administered 2024-07-12: 100 mL via INTRAVENOUS

## 2024-07-12 MED ORDER — ACETAMINOPHEN 500 MG PO TABS
1000.0000 mg | ORAL_TABLET | Freq: Once | ORAL | Status: AC
  Administered 2024-07-12: 1000 mg via ORAL
  Filled 2024-07-12: qty 2

## 2024-07-12 MED ORDER — LIDOCAINE-EPINEPHRINE (PF) 2 %-1:200000 IJ SOLN
INTRAMUSCULAR | Status: AC
  Filled 2024-07-12: qty 20

## 2024-07-12 MED ORDER — LIDOCAINE-EPINEPHRINE (PF) 2 %-1:200000 IJ SOLN
20.0000 mL | Freq: Once | INTRAMUSCULAR | Status: AC
  Administered 2024-07-12: 20 mL via INTRADERMAL
  Filled 2024-07-12: qty 20

## 2024-07-12 MED ORDER — LIDOCAINE-EPINEPHRINE 2 %-1:100000 IJ SOLN: 20.0000 mL | Freq: Once | INTRAMUSCULAR | Status: DC

## 2024-07-12 MED ORDER — LIDOCAINE HCL 1 % IJ SOLN
INTRAMUSCULAR | Status: AC
  Filled 2024-07-12: qty 20

## 2024-07-12 MED ORDER — PROTHROMBIN COMPLEX CONC HUMAN 500 UNITS IV KIT: 50.0000 [IU]/kg | PACK | Status: DC

## 2024-07-12 NOTE — ED Triage Notes (Signed)
 Pt was was playing pickleball, lost footing, fell backwards and hit head on a metal post. Pt is on Eliquis , significant hx blood clots. Also c/o R thumb pain

## 2024-07-12 NOTE — ED Provider Notes (Signed)
 [place] EMERGENCY DEPARTMENT AT [facility] Provider Note   CSN: [id number] Arrival date & time: 07/12/24  1422     Patient presents with: Fall (Blood Thinners)   Heather Hudson is a 68 y.o. female.  {Add pertinent medical, surgical, social history, OB history to HPI:32947} HPI     68 year old female with a history of hypertension, May-Thurner syndrome, acute left lower extremity DVT and PE in August 2024 treated with anticoagulation and single section LLE aspiration thrombectomy and iliac stent placement in addition to filter placement, later with in-stent thrombus and stents recanalized and revised, extending into common femoral vein 10/2023 and IVC filter was removed, was on lovenox  then eliquis , stent thrombectomy and revision 03/2024 with IR, on eliquis  presents with concern for fall.     Patient was playing pickle ball, she lost her footing and fell backwards hitting her head on a metal post.  She also reports thumb pain//radial wrist tenderness.   No LOC, Denies numbness, weakness, difficulty talking or walking, visual changes or facial droop.    She is followed by Dr. Odean hematology regarding her thrombosis history.  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  She reporst since thrombectomy in June she has not yet had her 3 month CT to evaluate thrombus. Does not have chest pain, leg pain or dyspnea.      Past Medical History:  Diagnosis Date   Acute pulmonary embolism (HCC) 05/14/2023   Adenomatous colon polyp 2015   Allergy 1974   teenager, and 2008   Colovesical fistula 2015   COVID-19 06/2020   Environmental and seasonal allergies    Family history of adverse reaction to anesthesia    Sister with Malignant hyperthermia   Hx of adenomatous colonic polyps 01/19/2021   Hypertension 11/23   Low dose prescription   Lower abdominal pain  12/23/2019   Malignant hyperthermia    sister   Mass of soft tissue of abdomen 08/18/2023   May-Thurner syndrome    Migraines    Motion sickness    Persistent cough for 3 weeks or longer 10/10/2021   Pulmonary embolism (HCC) 05/14/2023     Prior to Admission medications   Medication Sig Start Date End Date Taking? Authorizing Provider  apixaban  (ELIQUIS ) 5 MG TABS tablet Take 1 tablet (5 mg total) by mouth 2 (two) times daily. 11/18/23   Gudena, Vinay, MD  Blood Glucose Monitoring Suppl DEVI 1 each by Does not apply route in the morning, at noon, and at bedtime. May substitute to any manufacturer covered by patient's insurance. 07/27/23   Gretta Comer POUR, NP  Calcium  Carb-Cholecalciferol 600-12.5 MG-MCG TABS Take 2 tablets by mouth daily.    [provider]  folic acid  (FOLVITE ) 1 MG tablet Take 2 mg by mouth daily. 09/14/23   [provider]  losartan  (COZAAR ) 25 MG tablet TAKE 1 TABLET BY MOUTH EVERY DAY FOR BLOOD PRESSURE 04/14/24   Clark, Katherine K, NP  omeprazole (PRILOSEC) 20 MG capsule Take 20 mg by mouth daily.    [provider]    Allergies: Metformin  and related, Aspirin, Ibuprofen, Other, and Propofol     Review of Systems  Updated Vital Signs BP (!) 173/103 (BP Location: Right Arm)   Pulse 83   Temp 98.4 F (36.9 C)   Resp 18   Ht 5' 8 (1.727 m)   Wt 79.4 kg  SpO2 100%   BMI 26.61 kg/m   Physical Exam Vitals and nursing note reviewed.  Constitutional:      General: She is not in acute distress.    Appearance: She is well-developed. She is not diaphoretic.  HENT:     Head: Normocephalic.  Eyes:     Conjunctiva/sclera: Conjunctivae normal.  Cardiovascular:     Rate and Rhythm: Normal rate and regular rhythm.     Heart sounds: Normal heart sounds. No murmur heard.    No friction rub. No gallop.  Pulmonary:     Effort: Pulmonary effort is normal. No respiratory distress.     Breath sounds: Normal breath sounds. No wheezing or  rales.  Abdominal:     General: There is no distension.     Palpations: Abdomen is soft.     Tenderness: There is no abdominal tenderness. There is no guarding.  Musculoskeletal:        General: No tenderness.     Cervical back: Normal range of motion.  Skin:    General: Skin is warm and dry.     Findings: No erythema or rash.  Neurological:     Mental Status: She is alert and oriented to person, place, and time.     (all labs ordered are listed, but only abnormal results are displayed) Labs Reviewed - No data to display  EKG: None  Radiology: No results found.  {Document cardiac monitor, telemetry assessment procedure when appropriate:32947} Procedures   Medications Ordered in the ED - No data to display    {Click here for ABCD2, HEART and other calculators REFRESH Note before signing:1}                                68 year old female with a history of hypertension, May-Thurner syndrome, acute left lower extremity DVT and PE in August 2024 treated with anticoagulation and single section LLE aspiration thrombectomy and iliac stent placement in addition to filter placement, later with in-stent thrombus and stents recanalized and revised, extending into common femoral vein 10/2023 and IVC filter was removed, was on lovenox  then eliquis , stent thrombectomy and revision 03/2024 with IR, on eliquis  presents with concern for fall.    CT head and CSpine completed given fall mechanism, anticoagulation, pain was evaluated by me and shows 5mm SDH along the anterior falx.  Discussed with Dr. Louis NS[facility] who recommends repeat Head CT in 6 hours, recommends stay off of eliquis  for 5 days if stable.        {Document critical care time when appropriate  Document review of labs and clinical decision tools ie [id number], etc  Document your independent review of radiology images and any outside records  Document your discussion with family members, caretakers and with consultants   Document social determinants of health affecting pt's care  Document your decision making why or why not admission, treatments were needed:32947:::1}   Final diagnoses:  None    ED Discharge Orders     None

## 2024-07-12 NOTE — ED Notes (Signed)
Bleeding controlled at time of triage.

## 2024-07-13 DIAGNOSIS — S065XAA Traumatic subdural hemorrhage with loss of consciousness status unknown, initial encounter: Secondary | ICD-10-CM | POA: Diagnosis not present

## 2024-07-13 MED ORDER — PANTOPRAZOLE SODIUM 40 MG PO TBEC
40.0000 mg | DELAYED_RELEASE_TABLET | Freq: Every day | ORAL | Status: DC
  Administered 2024-07-13 – 2024-07-14 (×2): 40 mg via ORAL
  Filled 2024-07-13 (×2): qty 1

## 2024-07-13 MED ORDER — LOSARTAN POTASSIUM 25 MG PO TABS
25.0000 mg | ORAL_TABLET | Freq: Every day | ORAL | Status: DC
  Administered 2024-07-13 – 2024-07-14 (×2): 25 mg via ORAL
  Filled 2024-07-13 (×2): qty 1

## 2024-07-13 MED ORDER — ACETAMINOPHEN 325 MG PO TABS
650.0000 mg | ORAL_TABLET | Freq: Once | ORAL | Status: AC
  Administered 2024-07-13: 650 mg via ORAL
  Filled 2024-07-13: qty 2

## 2024-07-13 NOTE — H&P (Signed)
 History and Physical    Patient: Heather Hudson FMW:[id number] DOB: 1956-02-20 DOA: 07/12/2024 DOS: the patient was seen and examined on 07/13/2024 PCP: Gretta Comer POUR, NP  Patient coming from: Home  Chief Complaint:  Chief Complaint  Patient presents with   Fall    Blood Thinners   HPI: Heather Hudson is a 68 y.o. 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  The patient was playing pickleball when they suddenly fell and hit their head on a pole. The patient did not remember the details of the fall or hitting their head. However, the patient did not experience blackout or loss of consciousness. The patient underwent two CT scans of the head, one initially and another at 9 PM the previous night. The scans revealed a stable brain bleed. Due to the patient's history of clotting and current use of blood thinners, further LLE US  was performed and showed DVT. As such, the patient was transferred from [facility] to [facility] ED for further evaluation.   In the ED, the patient was hypertensive. Labs unremarkable. CTA PE protocol neg for PE. CTH taken 6 hours apart showed no substantial change in lobulated acute subdural hemorrhage along the anterior falx measuring up to 5 mm in thickness. LLE US  showed DVT. EDP transferred pt to [facility] ED for hematology evaluation.    Review of Systems: As mentioned in the history of present illness. All other systems reviewed and are negative. Past Medical History:  Diagnosis Date   Acute pulmonary embolism (HCC) 05/14/2023   Adenomatous colon polyp 2015   Allergy 1974   teenager, and 2008   Colovesical fistula 2015   COVID-19 06/2020   Environmental and seasonal allergies    Family history of adverse reaction to anesthesia    Sister with Malignant hyperthermia   Hx of adenomatous colonic polyps 01/19/2021   Hypertension 11/23   Low dose prescription   Lower abdominal pain 12/23/2019    Malignant hyperthermia    sister   Mass of soft tissue of abdomen 08/18/2023   May-Thurner syndrome    Migraines    Motion sickness    Persistent cough for 3 weeks or longer 10/10/2021   Pulmonary embolism (HCC) 05/14/2023   Past Surgical History:  Procedure Laterality Date   COLON SURGERY  2016   fistula, small section removed   COLONOSCOPY  2015   Plus others   IR FEM POP ART STENT INC PTA MOD SED  10/22/2023   IR ILIAC ART PTA UNI INITIAL MOD SED  03/17/2024   IR INTRAVASCULAR ULTRASOUND NON CORONARY  05/19/2023   IR INTRAVASCULAR ULTRASOUND NON CORONARY  07/16/2023   IR INTRAVASCULAR ULTRASOUND NON CORONARY  03/16/2024   IR IVC FILTER PLMT / S&I /IMG GUID/MOD SED  05/19/2023   IR IVC FILTER RETRIEVAL / S&I /IMG GUID/MOD SED  10/22/2023   IR IVUS EACH ADDITIONAL NON CORONARY VESSEL  09/15/2023   IR PTA VENOUS EXCEPT DIALYSIS CIRCUIT  05/19/2023   IR PTA VENOUS EXCEPT DIALYSIS CIRCUIT  07/16/2023   IR RADIOLOGIST EVAL & MGMT  07/01/2023   IR RADIOLOGIST EVAL & MGMT  08/21/2023   IR RADIOLOGIST EVAL & MGMT  11/13/2023   IR RADIOLOGIST EVAL & MGMT  02/22/2024   IR RADIOLOGIST EVAL & MGMT  05/02/2024   IR THROMBECT VENO MECH MOD SED  05/19/2023   IR THROMBECT VENO MECH MOD SED  07/16/2023   IR THROMBECT  VENO MECH MOD SED  10/22/2023   IR THROMBECT VENO MECH MOD SED  03/16/2024   IR TRANSCATH PLC STENT 1ST ART NOT LE CV CAR VERT CAR  05/19/2023   IR TRANSCATH PLC STENT 1ST ART NOT LE CV CAR VERT CAR  07/16/2023   IR US  GUIDE VASC ACCESS LEFT  05/19/2023   IR US  GUIDE VASC ACCESS LEFT  07/16/2023   IR US  GUIDE VASC ACCESS RIGHT  05/19/2023   IR US  GUIDE VASC ACCESS RIGHT  09/15/2023   IR US  GUIDE VASC ACCESS RIGHT  03/16/2024   IR VENO/EXT/UNI LEFT  05/19/2023   IR VENO/EXT/UNI LEFT  07/16/2023   IR VENO/EXT/UNI LEFT  10/22/2023   IR VENO/EXT/UNI LEFT  03/16/2024   IR VENOCAVAGRAM IVC  05/19/2023   IR VENOCAVAGRAM IVC  07/16/2023   IR VENOCAVAGRAM IVC  09/15/2023   IR VENOCAVAGRAM IVC   10/22/2023   IR VENOCAVAGRAM IVC  03/16/2024   LAPAROSCOPIC LEFT COLON RESECTION  2015   Colovesical fistula, [place]   Left peroneal tendon repair Left 2023   RADIOLOGY WITH ANESTHESIA N/A 10/22/2023   Procedure: LLE venous thrombectomy;  Surgeon: Jennefer Ester PARAS, MD;  Location: [facility] OR;  Service: Radiology;  Laterality: N/A;   RADIOLOGY WITH ANESTHESIA N/A 03/16/2024   Procedure: RADIOLOGY WITH ANESTHESIA;  Surgeon: Jennefer Ester PARAS, MD;  Location: [facility] OR;  Service: Radiology;  Laterality: N/A;  Left iliofemoral vein stent thrombectomy   WISDOM TOOTH EXTRACTION     Social History:  reports that she has never smoked. She has never used smokeless tobacco. She reports current alcohol use of about 5.0 standard drinks of alcohol per week. She reports that she does not use drugs.  Allergies  Allergen Reactions   Metformin  And Related Nausea And Vomiting   Aspirin Hives   Ibuprofen Hives   Other     Anesthesia (familial) SISTER HAD MALIGNANT HYPERTHERMIA   Propofol  Other (See Comments)    Mother had issues with it    Family History  Problem Relation Age of Onset   Heart disease Mother    Diabetes Mother    Hearing loss Mother    Heart disease Father    Prostate cancer Father    Alcohol abuse Father    Cancer Father    COPD Father    Malignant hyperthermia Sister    Pancreatic cancer Maternal Aunt    Diabetes Brother    Cancer Maternal Grandmother    Miscarriages / Stillbirths Maternal Grandmother    Diabetes Brother    Intellectual disability Brother    Learning disabilities Brother    Vision loss Brother    Miscarriages / Stillbirths Sister    Miscarriages / [place] Sister    Colon cancer Neg Hx    Esophageal cancer Neg Hx    Stomach cancer Neg Hx    Liver disease Neg Hx    Breast cancer Neg Hx     Prior to Admission medications   Medication Sig Start Date End Date Taking? Authorizing Provider  apixaban  (ELIQUIS ) 5 MG TABS tablet Take 1 tablet (5 mg total) by mouth 2  (two) times daily. 11/18/23  Yes Odean Potts, MD  Calcium  Carb-Cholecalciferol 600-12.5 MG-MCG TABS Take 1 tablet by mouth daily.   Yes [provider]  clopidogrel  (PLAVIX ) 75 MG tablet Take 75 mg by mouth daily. 04/15/24  Yes [provider]  folic acid  (FOLVITE ) 1 MG tablet Take 2 mg by mouth daily. 09/14/23  Yes [provider]  losartan  (COZAAR ) 25 MG tablet TAKE 1 TABLET BY MOUTH EVERY DAY FOR BLOOD PRESSURE 04/14/24  Yes Clark, Katherine K, NP  omeprazole (PRILOSEC) 20 MG capsule Take 20 mg by mouth daily.   Yes [provider]    Physical Exam: Vitals:   07/13/24 1000 07/13/24 1100 07/13/24 1115 07/13/24 1130  BP: (!) 144/72 129/74 (!) 143/68 (!) 140/66  Pulse: 60 67 60 61  Resp: 13 18 12 15   Temp:      TempSrc:      SpO2: 98% 100% 100% 100%  Weight:      Height:       General: Alert, oriented x3, resting comfortably in no acute distress HEENT: Head wrapped c/d/i Neck: Trachea midline and no gross thyromegaly Respiratory: Lungs clear to auscultation bilaterally with normal respiratory effort; no w/r/r Cardiovascular: Regular rate and rhythm w/o m/r/g Abdomen: Soft, nontender, nondistended. Positive bowel sounds MSK: LLE without swelling   Data Reviewed:  Lab Results  Component Value Date   WBC 6.0 07/12/2024   HGB 13.3 07/12/2024   HCT 38.2 07/12/2024   MCV 87.0 07/12/2024   PLT 142 (L) 07/12/2024   Lab Results  Component Value Date   GLUCOSE 112 (H) 07/12/2024   CALCIUM  11.3 (H) 07/12/2024   NA 137 07/12/2024   K 3.7 07/12/2024   CO2 20 (L) 07/12/2024   CL 103 07/12/2024   BUN 14 07/12/2024   CREATININE 0.82 07/12/2024   Lab Results  Component Value Date   ALT 33 03/01/2024   AST 25 03/01/2024   ALKPHOS 107 03/01/2024   BILITOT 0.9 03/01/2024   Lab Results  Component Value Date   INR 1.1 03/16/2024   INR 1.4 (H) 03/16/2024   INR 1.3 (H) 10/22/2023   Radiology: CT Head Wo Contrast Result Date: 07/12/2024 EXAM:  CT HEAD WITHOUT CONTRAST 07/12/2024 09:07:00 PM TECHNIQUE: CT of the head was performed without the administration of intravenous contrast. Automated exposure control, iterative reconstruction, and/or weight based adjustment of the mA/kV was utilized to reduce the radiation dose to as low as reasonably achievable. COMPARISON: None available. CLINICAL HISTORY: Subdural hematoma. F/u subdural hematoma FINDINGS: BRAIN AND VENTRICLES: No substantial change in lobulated acute subdural hemorrhage along the anterior falx measuring up to 5 mm in thickness. No evidence of acute infarct. No hydrocephalus. No extra-axial collection. No mass effect or midline shift. ORBITS: No acute abnormality. SINUSES: No acute abnormality. SOFT TISSUES AND SKULL: Left parietal scalp contusion/laceration. No skull fracture. IMPRESSION: 1. No substantial change in lobulated acute subdural hemorrhage along the anterior falx measuring up to 5 mm in thickness. Electronically signed by: Gilmore Molt MD 07/12/2024 09:18 PM EDT RP Workstation: [id number]   US  Venous Img Lower  Left (DVT Study) Result Date: 07/12/2024 CLINICAL DATA:  Pain.  Left lower extremity pain after a fall today. EXAM: Left LOWER EXTREMITY VENOUS DOPPLER ULTRASOUND TECHNIQUE: Gray-scale sonography with compression, as well as color and duplex ultrasound, were performed to evaluate the deep venous system(s) from the level of the common femoral vein through the popliteal and proximal calf veins. COMPARISON:  Ultrasound 10/06/2023.  CT venogram 07/12/2024 FINDINGS: VENOUS Stent in the left common femoral vein is patent. Saphenofemoral junction is patent. Deep femoral vein is patent. Echogenic noncompressible thrombus demonstrated in the left superficial femoral vein with absence of flow on spectral and color flow Doppler imaging. This corresponds to the area of abnormal flow demonstrated on prior CT venogram. Popliteal vein and visualized calf veins are patent. Limited views  of the contralateral common femoral vein are unremarkable. OTHER None. Limitations: none IMPRESSION: Positive examination for deep venous thrombosis involving the left superficial femoral vein. The left common femoral vein stent is patent. Electronically Signed   By: Elsie Gravely M.D.   On: 07/12/2024 21:04   CT VENOGRAM ABD/PEL Result Date: 07/12/2024 CLINICAL DATA:  Deep venous thrombosis.  History of recurrent clots. EXAM: CT VENOGRAM ABDOMEN AND PELVIS TECHNIQUE: Venographic phase images of the abdomen and pelvis were obtained following the administration of intravenous contrast. Multiplanar reformats and maximum intensity projections were generated. RADIATION DOSE REDUCTION: This exam was performed according to the departmental dose-optimization program which includes automated exposure control, adjustment of the mA and/or kV according to patient size and/or use of iterative reconstruction technique. CONTRAST:  OMNIPAQUE  IOHEXOL  350 MG/ML SOLN COMPARISON:  04/22/2024 FINDINGS: Lower chest:  Atelectasis in the lung bases. Hepatobiliary: Diffuse fatty infiltration of the liver. No focal lesions. Gallbladder and bile ducts are normal. Pancreas: Normal pancreas. Spleen: Mild splenic enlargement without focal lesion. Similar prior study. Adrenals/Urinary Tract: No adrenal gland nodules. Kidneys are symmetrical. No hydronephrosis or hydroureter. Unchanged appearance of right renal cysts. No imaging follow-up is indicated. Bladder is decompressed. No abnormality demonstrated. Stomach/Bowel: Stomach, small bowel, and colon are not abnormally distended. No wall thickening or inflammatory stranding. Colon is diffusely stool-filled. Rectosigmoid anastomosis. Appendix is normal. Vascular/Lymphatic: Calcification of the aorta. No aneurysm. No significant lymphadenopathy. Venogram: Stent in the origin of the left common iliac vein, extending through the external iliac vein to the common femoral vein. The stent  appears patent. Hepatic veins, inferior vena cava, iliac, external iliac, and common femoral veins appear patent. Flow appears to be asymmetrically decreased in the left superficial femoral veins compared with the right. This suggest possible deep venous thrombosis although may be artifact due to slower flow. Consider ultrasound for further evaluation. Portal veins and mesenteric veins are patent. Reproductive: No pelvic mass. Other: No free air or free fluid in the abdomen. Abdominal wall musculature appears intact. Musculoskeletal: No acute bony abnormalities. IMPRESSION: 1. Left common iliac/external iliac venous stent appears patent. 2. Asymmetric decreased flow demonstrated in the left superficial femoral vein, possibly venous thrombosis or artifact. Consider ultrasound leg veins for further evaluation. 3. Aortic atherosclerosis. 4. Hepatosplenomegaly with fatty infiltration of the liver. These results were called by telephone at the time of interpretation on 07/12/2024 at 7:39 pm to provider [facility] , who verbally acknowledged these results. Electronically Signed   By: Elsie Gravely M.D.   On: 07/12/2024 19:44   CT Angio Chest PE W and/or Wo Contrast Result Date: 07/12/2024 CLINICAL DATA:  Pulmonary embolus suspected with high probability. EXAM: CT ANGIOGRAPHY CHEST WITH CONTRAST TECHNIQUE: Multidetector CT imaging of the chest was performed using the standard protocol during bolus administration of intravenous contrast. Multiplanar CT image reconstructions and MIPs were obtained to evaluate the vascular anatomy. RADIATION DOSE REDUCTION: This exam was performed according to the departmental dose-optimization program which includes automated exposure control, adjustment of the mA and/or kV according to patient size and/or use of iterative reconstruction technique. CONTRAST:  OMNIPAQUE  IOHEXOL  350 MG/ML SOLN COMPARISON:  02/08/2024 FINDINGS: Cardiovascular: Technically adequate study with  good opacification of the central and segmental pulmonary arteries. Mild motion artifact. No focal filling defects. No evidence of significant pulmonary embolus. Normal heart size. No pericardial effusions. Normal caliber thoracic aorta. No aortic dissection. Great vessel origins are patent. Minimal calcification in the aorta. Mediastinum/Nodes: Esophagus is decompressed. No significant lymphadenopathy.  Thyroid  gland is unremarkable. Lungs/Pleura: Motion artifact limits evaluation. Focal plate like and nodular opacities in the right middle lung likely representing scarring. The appearance is unchanged since prior study. Linear scarring also in the lung bases. No pleural effusion or pneumothorax. Upper Abdomen: No acute abnormalities. Musculoskeletal: No chest wall abnormality. No acute or significant osseous findings. Review of the MIP images confirms the above findings. IMPRESSION: 1. No evidence of significant pulmonary embolus. 2. Persistent finding of plate like and nodular opacities in the right middle lung likely representing chronic scarring. No change since prior study. 3. Mild aortic atherosclerosis. Electronically Signed   By: Elsie Gravely M.D.   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  SDH -Neurosurgery consulted; apprec eval/recs (prior recs for holding OAC for 5 days) -HOLD pta apixaban  5mg  BID for now  LLE DVT -Hematology consulted; apprec recs (Dr. Odean suggested considering heparin  gtt in 24 hr, and possible replacement of IVC filter) -HOLD pta apixaban  5mg  BID for now  HTN -PTA losartan  25mg  daily   Advance Care Planning:   Code Status: Prior   Consults: Oncology and NSG  Family Communication: Husband  Severity of Illness: The appropriate patient status for this patient is INPATIENT. Inpatient status is judged to be reasonable and necessary in order to provide the required  intensity of service to ensure the patient's safety. The patient's presenting symptoms, physical exam findings, and initial radiographic and laboratory data in the context of their chronic comorbidities is felt to place them at high risk for further clinical deterioration. Furthermore, it is not anticipated that the patient will be medically stable for discharge from the hospital within 2 midnights of admission.   * I certify that at the point of admission it is my clinical judgment that the patient will require inpatient hospital care spanning beyond 2 midnights from the point of admission due to high intensity of service, high risk for further deterioration and high frequency of surveillance required.*   ------- I spent 55 minutes reviewing previous notes, at the bedside counseling/discussing the treatment plan, and performing clinical documentation.  Author: Marsha Ada, MD 07/13/2024 12:46 PM  For on call review [email or web address].

## 2024-07-13 NOTE — ED Triage Notes (Signed)
 BIB [facility] from [facility] r/t SDH (5mm) that happened yesterday from fall. Repeat CT today at [facility] shows SDH remaining at 5mm. On Eliquis . Repaired posterior laceration to posterior scalp with 5 staples present. A&Ox4. Neuro intact.

## 2024-07-13 NOTE — ED Notes (Signed)
 [facility] called.

## 2024-07-13 NOTE — ED Notes (Signed)
 Called [facility] to transport the patient to [facility]--Dr. Dasie is accepting

## 2024-07-14 DIAGNOSIS — I82412 Acute embolism and thrombosis of left femoral vein: Secondary | ICD-10-CM

## 2024-07-14 DIAGNOSIS — I871 Compression of vein: Secondary | ICD-10-CM

## 2024-07-14 DIAGNOSIS — S065XAA Traumatic subdural hemorrhage with loss of consciousness status unknown, initial encounter: Secondary | ICD-10-CM

## 2024-07-14 LAB — BASIC METABOLIC PANEL WITH GFR
Anion gap: 11 (ref 5–15)
BUN: 13 mg/dL (ref 8–23)
CO2: 23 mmol/L (ref 22–32)
Calcium: 10 mg/dL (ref 8.9–10.3)
Chloride: 108 mmol/L (ref 98–111)
Creatinine, Ser: 0.71 mg/dL (ref 0.44–1.00)
GFR, Estimated: >60 mL/min (ref >60)
Glucose, Bld: 114 mg/dL — ABNORMAL HIGH (ref 70–99)
Potassium: 3.9 mmol/L (ref 3.5–5.1)
Sodium: 142 mmol/L (ref 135–145)

## 2024-07-14 LAB — HIV ANTIBODY (ROUTINE TESTING W REFLEX): HIV Screen 4th Generation wRfx: NONREACTIVE

## 2024-07-14 NOTE — Hospital Course (Signed)
 Heather Hudson is a 68 y.o. 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  Patient presented secondary to a fall with scalp laceration and found to have evidence of a small subdural hemorrhage. Eliquis  and Plavix  held. Neurosurgery consulted. Repeat CT head with stable 5 mm SDH. Hematology consulted as well. Recommendation from neurosurgery to hold anticoagulation for 7 days total. Patient discharged with compression stockings.

## 2024-07-14 NOTE — TOC Transition Note (Signed)
 Transition of Care [facility]) - Discharge Note   Patient Details  Name: Heather Hudson MRN: [id number] Date of Birth: January 05, 1956  [facility]) CM/SW Contact:  Andrez JULIANNA George, RN Phone Number: 07/14/2024, 10:37 AM   Clinical Narrative:     Heather Hudson is a 68 y.o. 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  Pt will discharging home with self care. She lives with spouse that is with her most of the time.  She drives and manages her own medications.  The live in a 1 level home. No DME. Spouse is able to provide care to head wound at  home.  Spouse will transport home.  Final next level of care: Home/Self Care Barriers to Discharge: No Barriers Identified   Patient Goals and CMS Choice            Discharge Placement                       Discharge Plan and Services Additional resources added to the After Visit Summary for                                       Social Drivers of Health (SDOH) Interventions SDOH Screenings   Food Insecurity: No Food Insecurity (07/14/2024)  Housing: Low Risk  (07/14/2024)  Transportation Needs: No Transportation Needs (07/14/2024)  Utilities: Not At Risk (07/14/2024)  Alcohol Screen: Low Risk  (03/17/2024)  Depression (PHQ2-9): Low Risk  (03/17/2024)  Financial Resource Strain: Low Risk  (03/17/2024)  Physical Activity: Insufficiently Active (03/17/2024)  Social Connections: Socially Integrated (07/14/2024)  Stress: No Stress Concern Present (03/17/2024)  Tobacco Use: Low Risk  (07/12/2024)  Health Literacy: Adequate Health Literacy (03/17/2024)     Readmission Risk Interventions     No data to display

## 2024-07-14 NOTE — Consult Note (Signed)
 WOC Nurse Consult Note: Reason for Consult: scalp wound Wound type: traumatic injury post fall, repaired with staples in place Pressure Injury POA: NA Measurement: 2 cm approximated staple line Wound bed: approximated staple line Drainage (amount, consistency, odor) sanguinous oozing in the superior portion of wound Periwound: intact Dressing procedure/placement/frequency:  Remove old dressing (can soak with saline for ease of removal) Cleanse with NS, pat dry.  Place Xeroform gauze over wound bed, cover with dry gauze and wrap with Kerlix or stretch gauze which ever is preferred.  Change daily.   WOC team will not follow at this time. Please re consult if new needs arise.  Thank you,  Doyal Polite, MSN, RN, [facility] WOC Team [phone] (Available Mon-Fri 0700-1500)

## 2024-07-14 NOTE — TOC CAGE-AID Note (Signed)
 Transition of Care [facility]) - CAGE-AID Screening   Patient Details  Name: Heather Hudson MRN: [id number] Date of Birth: April 28, 1956  [facility] CM/SW Contact:    Carry Ortez M, RN Phone Number: 07/14/2024, 5:10 PM   Clinical Narrative: Patient adm s/p fall on blood thinners. She admits to 5 drinks per week, but denies need for SA resources.    CAGE-AID Screening:    Have You Ever Felt You Ought to Cut Down on Your Drinking or Drug Use?: No Have People Annoyed You By Critizing Your Drinking Or Drug Use?: No Have You Felt Bad Or Guilty About Your Drinking Or Drug Use?: No Have You Ever Had a Drink or Used Drugs First Thing In The Morning to Steady Your Nerves or to Get Rid of a Hangover?: No CAGE-AID Score: 0  Substance Abuse Education Offered: No   Mliss MICAEL Fass, RN, BSN  Trauma/Neuro ICU Case Manager [phone]

## 2024-07-14 NOTE — Care Management Obs Status (Signed)
 MEDICARE OBSERVATION STATUS NOTIFICATION   Patient Details  Name: Heather Hudson MRN: [id number] Date of Birth: [birth year]   Medicare Observation Status Notification Given:  Yes Verbally reviewed observation notice with [name] telephonically at [phone].  Will deliver copy to the patients room.   [name] 07/14/2024, 11:07 AM

## 2024-07-14 NOTE — Consult Note (Cosign Needed Addendum)
 Providing Compassionate, Quality Care - Together   Reason for Consult: Subdural Hematoma Referring Physician: [facility]  Heather Hudson is an 68 y.o. female.  HPI: Heather Hudson is a 68 year old female with a past medical history outlined below. She lost her footing and struck her head while playing pickleball. 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 CT imaging in the ED revealed an acute SDH along the anterior falx. Follow up imaging showed no substantial change in the SDH. Given the patient's need for anticoagulation, Neurosurgery was asked to evaluate the patient and make recommendations.  Presently, the patient is resting comfortably in bed. Her husband is at the bedside. The wound nurse is getting ready to evaluate the patient's scalp laceration. The patient reports tenderness at the back of her head. She denies weakness, changes in speech or vision, dizziness, or seizure activity.  Past Medical History:  Diagnosis Date   Acute pulmonary embolism (HCC) 05/14/2023   Adenomatous colon polyp 2015   Allergy 1974   teenager, and 2008   Colovesical fistula 2015   COVID-19 06/2020   Environmental and seasonal allergies    Family history of adverse reaction to anesthesia    Sister with Malignant hyperthermia   Hx of adenomatous colonic polyps 01/19/2021   Hypertension 11/23   Low dose prescription   Lower abdominal pain 12/23/2019   Malignant hyperthermia    sister   Mass of soft tissue of abdomen 08/18/2023   May-Thurner syndrome    Migraines    Motion sickness    Persistent cough for 3 weeks or longer 10/10/2021   Pulmonary embolism (HCC) 05/14/2023    Past Surgical History:  Procedure Laterality Date   COLON SURGERY  2016   fistula, small section removed   COLONOSCOPY  2015   Plus others   IR FEM POP ART STENT INC PTA MOD SED  10/22/2023   IR ILIAC ART PTA UNI INITIAL MOD SED  03/17/2024   IR INTRAVASCULAR ULTRASOUND NON CORONARY  05/19/2023   IR  INTRAVASCULAR ULTRASOUND NON CORONARY  07/16/2023   IR INTRAVASCULAR ULTRASOUND NON CORONARY  03/16/2024   IR IVC FILTER PLMT / S&I /IMG GUID/MOD SED  05/19/2023   IR IVC FILTER RETRIEVAL / S&I /IMG GUID/MOD SED  10/22/2023   IR IVUS EACH ADDITIONAL NON CORONARY VESSEL  09/15/2023   IR PTA VENOUS EXCEPT DIALYSIS CIRCUIT  05/19/2023   IR PTA VENOUS EXCEPT DIALYSIS CIRCUIT  07/16/2023   IR RADIOLOGIST EVAL & MGMT  07/01/2023   IR RADIOLOGIST EVAL & MGMT  08/21/2023   IR RADIOLOGIST EVAL & MGMT  11/13/2023   IR RADIOLOGIST EVAL & MGMT  02/22/2024   IR RADIOLOGIST EVAL & MGMT  05/02/2024   IR THROMBECT VENO MECH MOD SED  05/19/2023   IR THROMBECT VENO MECH MOD SED  07/16/2023   IR THROMBECT VENO MECH MOD SED  10/22/2023   IR THROMBECT VENO MECH MOD SED  03/16/2024   IR TRANSCATH PLC STENT 1ST ART NOT LE CV CAR VERT CAR  05/19/2023   IR TRANSCATH PLC STENT 1ST ART NOT LE CV CAR VERT CAR  07/16/2023   IR US  GUIDE VASC ACCESS LEFT  05/19/2023   IR US  GUIDE VASC ACCESS LEFT  07/16/2023   IR US  GUIDE VASC ACCESS RIGHT  05/19/2023   IR US  GUIDE VASC ACCESS RIGHT  09/15/2023   IR US  GUIDE VASC ACCESS RIGHT  03/16/2024   IR VENO/EXT/UNI LEFT  05/19/2023   IR VENO/EXT/UNI LEFT  07/16/2023   IR VENO/EXT/UNI LEFT  10/22/2023   IR VENO/EXT/UNI LEFT  03/16/2024   IR VENOCAVAGRAM IVC  05/19/2023   IR VENOCAVAGRAM IVC  07/16/2023   IR VENOCAVAGRAM IVC  09/15/2023   IR VENOCAVAGRAM IVC  10/22/2023   IR VENOCAVAGRAM IVC  03/16/2024   LAPAROSCOPIC LEFT COLON RESECTION  2015   Colovesical fistula, [place]   Left peroneal tendon repair Left 2023   RADIOLOGY WITH ANESTHESIA N/A 10/22/2023   Procedure: LLE venous thrombectomy;  Surgeon: Jennefer Ester PARAS, MD;  Location: [facility] OR;  Service: Radiology;  Laterality: N/A;   RADIOLOGY WITH ANESTHESIA N/A 03/16/2024   Procedure: RADIOLOGY WITH ANESTHESIA;  Surgeon: Jennefer Ester PARAS, MD;  Location: [facility] OR;  Service: Radiology;  Laterality: N/A;  Left iliofemoral vein stent thrombectomy    WISDOM TOOTH EXTRACTION      Family History  Problem Relation Age of Onset   Heart disease Mother    Diabetes Mother    Hearing loss Mother    Heart disease Father    Prostate cancer Father    Alcohol abuse Father    Cancer Father    COPD Father    Malignant hyperthermia Sister    Pancreatic cancer Maternal Aunt    Diabetes Brother    Cancer Maternal Grandmother    Miscarriages / Stillbirths Maternal Grandmother    Diabetes Brother    Intellectual disability Brother    Learning disabilities Brother    Vision loss Brother    Miscarriages / Stillbirths Sister    Miscarriages / [place] Sister    Colon cancer Neg Hx    Esophageal cancer Neg Hx    Stomach cancer Neg Hx    Liver disease Neg Hx    Breast cancer Neg Hx     Social History:  reports that she has never smoked. She has never used smokeless tobacco. She reports current alcohol use of about 5.0 standard drinks of alcohol per week. She reports that she does not use drugs.  Allergies:  Allergies  Allergen Reactions   Metformin  And Related Nausea And Vomiting   Aspirin Hives   Ibuprofen Hives   Other     Anesthesia (familial) SISTER HAD MALIGNANT HYPERTHERMIA   Propofol  Other (See Comments)    Mother had issues with it    Medications: I have reviewed the patient's current medications.  Results for orders placed or performed during the hospital encounter of 07/12/24 (from the past 48 hours)  CBC     Status: Abnormal   Collection Time: 07/12/24  3:42 PM  Result Value Ref Range   WBC 6.0 4.0 - 10.5 K/uL   RBC 4.39 3.87 - 5.11 MIL/uL   Hemoglobin 13.3 12.0 - 15.0 g/dL   HCT 61.7 63.9 - 53.9 %   MCV 87.0 80.0 - 100.0 fL   MCH 30.3 26.0 - 34.0 pg   MCHC 34.8 30.0 - 36.0 g/dL   RDW 86.6 88.4 - 84.4 %   Platelets 142 (L) 150 - 400 K/uL   nRBC 0.0 0.0 - 0.2 %    Comment: Performed at [facility], [address], [place], [place] [address]  Basic metabolic panel     Status: Abnormal    Collection Time: 07/12/24  3:42 PM  Result Value Ref Range   Sodium 137 135 - 145 mmol/L   Potassium 3.7 3.5 - 5.1 mmol/L   Chloride 103 98 - 111 mmol/L   CO2 20 (L)  22 - 32 mmol/L   Glucose, Bld 112 (H) 70 - 99 mg/dL    Comment: Glucose reference range applies only to samples taken after fasting for at least 8 hours.   BUN 14 8 - 23 mg/dL   Creatinine, Ser 9.17 0.44 - 1.00 mg/dL   Calcium  11.3 (H) 8.9 - 10.3 mg/dL   GFR, Estimated >39 >39 mL/min    Comment: (NOTE) Calculated using the CKD-EPI Creatinine Equation (2021)    Anion gap 14 5 - 15    Comment: Performed at [facility], [address], [place], [place] [address]  Heparin  level (unfractionated) - if patient on rivaoxaban (Xarelto) or apixaban  (Eliquis )     Status: Abnormal   Collection Time: 07/12/24  4:17 PM  Result Value Ref Range   Heparin  Unfractionated >1.10 (H) 0.30 - 0.70 IU/mL    Comment: (NOTE) The clinical reportable range upper limit is being lowered to >1.10 to align with the FDA approved guidance for the current laboratory assay.  If heparin  results are below expected values, and patient dosage has  been confirmed, suggest follow up testing of antithrombin III  levels. Performed at [facility], [address], [place], [place] [address]   Basic metabolic panel     Status: Abnormal   Collection Time: 07/14/24  1:31 AM  Result Value Ref Range   Sodium 142 135 - 145 mmol/L   Potassium 3.9 3.5 - 5.1 mmol/L   Chloride 108 98 - 111 mmol/L   CO2 23 22 - 32 mmol/L   Glucose, Bld 114 (H) 70 - 99 mg/dL    Comment: Glucose reference range applies only to samples taken after fasting for at least 8 hours.   BUN 13 8 - 23 mg/dL   Creatinine, Ser 9.28 0.44 - 1.00 mg/dL   Calcium  10.0 8.9 - 10.3 mg/dL   GFR, Estimated >39 >39 mL/min    Comment: (NOTE) Calculated using the CKD-EPI Creatinine Equation (2021)    Anion gap 11 5 - 15    Comment: Performed at [facility], [address], [place], [place] [address]    CT Head Wo Contrast Result Date: 07/12/2024 EXAM: CT HEAD WITHOUT CONTRAST 07/12/2024 09:07:00 PM TECHNIQUE: CT of the head was performed without the administration of intravenous contrast. Automated exposure control, iterative reconstruction, and/or weight based adjustment of the mA/kV was utilized to reduce the radiation dose to as low as reasonably achievable. COMPARISON: None available. CLINICAL HISTORY: Subdural hematoma. F/u subdural hematoma FINDINGS: BRAIN AND VENTRICLES: No substantial change in lobulated acute subdural hemorrhage along the anterior falx measuring up to 5 mm in thickness. No evidence of acute infarct. No hydrocephalus. No extra-axial collection. No mass effect or midline shift. ORBITS: No acute abnormality. SINUSES: No acute abnormality. SOFT TISSUES AND SKULL: Left parietal scalp contusion/laceration. No skull fracture. IMPRESSION: 1. No substantial change in lobulated acute subdural hemorrhage along the anterior falx measuring up to 5 mm in thickness. Electronically signed by: Gilmore Molt MD 07/12/2024 09:18 PM EDT RP Workstation: [id number]   US  Venous Img Lower  Left (DVT Study) Result Date: 07/12/2024 CLINICAL DATA:  Pain.  Left lower extremity pain after a fall today. EXAM: Left LOWER EXTREMITY VENOUS DOPPLER ULTRASOUND TECHNIQUE: Gray-scale sonography with compression, as well as color and duplex ultrasound, were performed to evaluate the deep venous system(s) from the level of the common femoral vein through the popliteal and proximal calf veins. COMPARISON:  Ultrasound 10/06/2023.  CT venogram 07/12/2024 FINDINGS:  VENOUS Stent in the left common femoral vein is patent. Saphenofemoral junction is patent. Deep femoral vein is patent. Echogenic noncompressible thrombus demonstrated in the left superficial femoral vein with absence of flow on spectral and color flow Doppler imaging. This corresponds to the area of abnormal flow demonstrated  on prior CT venogram. Popliteal vein and visualized calf veins are patent. Limited views of the contralateral common femoral vein are unremarkable. OTHER None. Limitations: none IMPRESSION: Positive examination for deep venous thrombosis involving the left superficial femoral vein. The left common femoral vein stent is patent. Electronically Signed   By: Elsie Gravely M.D.   On: 07/12/2024 21:04   CT VENOGRAM ABD/PEL Result Date: 07/12/2024 CLINICAL DATA:  Deep venous thrombosis.  History of recurrent clots. EXAM: CT VENOGRAM ABDOMEN AND PELVIS TECHNIQUE: Venographic phase images of the abdomen and pelvis were obtained following the administration of intravenous contrast. Multiplanar reformats and maximum intensity projections were generated. RADIATION DOSE REDUCTION: This exam was performed according to the departmental dose-optimization program which includes automated exposure control, adjustment of the mA and/or kV according to patient size and/or use of iterative reconstruction technique. CONTRAST:  OMNIPAQUE  IOHEXOL  350 MG/ML SOLN COMPARISON:  04/22/2024 FINDINGS: Lower chest:  Atelectasis in the lung bases. Hepatobiliary: Diffuse fatty infiltration of the liver. No focal lesions. Gallbladder and bile ducts are normal. Pancreas: Normal pancreas. Spleen: Mild splenic enlargement without focal lesion. Similar prior study. Adrenals/Urinary Tract: No adrenal gland nodules. Kidneys are symmetrical. No hydronephrosis or hydroureter. Unchanged appearance of right renal cysts. No imaging follow-up is indicated. Bladder is decompressed. No abnormality demonstrated. Stomach/Bowel: Stomach, small bowel, and colon are not abnormally distended. No wall thickening or inflammatory stranding. Colon is diffusely stool-filled. Rectosigmoid anastomosis. Appendix is normal. Vascular/Lymphatic: Calcification of the aorta. No aneurysm. No significant lymphadenopathy. Venogram: Stent in the origin of the left common  iliac vein, extending through the external iliac vein to the common femoral vein. The stent appears patent. Hepatic veins, inferior vena cava, iliac, external iliac, and common femoral veins appear patent. Flow appears to be asymmetrically decreased in the left superficial femoral veins compared with the right. This suggest possible deep venous thrombosis although may be artifact due to slower flow. Consider ultrasound for further evaluation. Portal veins and mesenteric veins are patent. Reproductive: No pelvic mass. Other: No free air or free fluid in the abdomen. Abdominal wall musculature appears intact. Musculoskeletal: No acute bony abnormalities. IMPRESSION: 1. Left common iliac/external iliac venous stent appears patent. 2. Asymmetric decreased flow demonstrated in the left superficial femoral vein, possibly venous thrombosis or artifact. Consider ultrasound leg veins for further evaluation. 3. Aortic atherosclerosis. 4. Hepatosplenomegaly with fatty infiltration of the liver. These results were called by telephone at the time of interpretation on 07/12/2024 at 7:39 pm to provider [facility] , who verbally acknowledged these results. Electronically Signed   By: Elsie Gravely M.D.   On: 07/12/2024 19:44   CT Angio Chest PE W and/or Wo Contrast Result Date: 07/12/2024 CLINICAL DATA:  Pulmonary embolus suspected with high probability. EXAM: CT ANGIOGRAPHY CHEST WITH CONTRAST TECHNIQUE: Multidetector CT imaging of the chest was performed using the standard protocol during bolus administration of intravenous contrast. Multiplanar CT image reconstructions and MIPs were obtained to evaluate the vascular anatomy. RADIATION DOSE REDUCTION: This exam was performed according to the departmental dose-optimization program which includes automated exposure control, adjustment of the mA and/or kV according to patient size and/or use of iterative reconstruction technique. CONTRAST:  OMNIPAQUE  IOHEXOL  350  MG/ML SOLN COMPARISON:  02/08/2024 FINDINGS: Cardiovascular: Technically adequate study with good opacification of the central and segmental pulmonary arteries. Mild motion artifact. No focal filling defects. No evidence of significant pulmonary embolus. Normal heart size. No pericardial effusions. Normal caliber thoracic aorta. No aortic dissection. Great vessel origins are patent. Minimal calcification in the aorta. Mediastinum/Nodes: Esophagus is decompressed. No significant lymphadenopathy. Thyroid  gland is unremarkable. Lungs/Pleura: Motion artifact limits evaluation. Focal plate like and nodular opacities in the right middle lung likely representing scarring. The appearance is unchanged since prior study. Linear scarring also in the lung bases. No pleural effusion or pneumothorax. Upper Abdomen: No acute abnormalities. Musculoskeletal: No chest wall abnormality. No acute or significant osseous findings. Review of the MIP images confirms the above findings. IMPRESSION: 1. No evidence of significant pulmonary embolus. 2. Persistent finding of plate like and nodular opacities in the right middle lung likely representing chronic scarring. No change since prior study. 3. Mild aortic atherosclerosis. Electronically Signed   By: Elsie Gravely M.D.   On: 07/12/2024 19:29   DG Wrist Complete Right Result Date: 07/12/2024 CLINICAL DATA:  Right thumb pain after a fall while playing pickle ball. EXAM: RIGHT WRIST - COMPLETE 3+ VIEW COMPARISON:  None Available. FINDINGS: There is no evidence of fracture or dislocation. There is no evidence of arthropathy or other focal bone abnormality. Soft tissues are unremarkable. IMPRESSION: Negative. Electronically Signed   By: Elsie Gravely M.D.   On: 07/12/2024 15:52   CT Cervical Spine Wo Contrast Result Date: 07/12/2024 CLINICAL DATA:  fall on thinners EXAM: CT CERVICAL SPINE WITHOUT CONTRAST TECHNIQUE: Multidetector CT imaging of the cervical spine was performed  without intravenous contrast. Multiplanar CT image reconstructions were also generated. RADIATION DOSE REDUCTION: This exam was performed according to the departmental dose-optimization program which includes automated exposure control, adjustment of the mA and/or kV according to patient size and/or use of iterative reconstruction technique. COMPARISON:  None Available. FINDINGS: Alignment: Normal. Skull base and vertebrae: No acute fracture. Vertebral body heights are maintained. The dens and skull base are intact. Incidental bone island within left C4 posterior elements. Soft tissues and spinal canal: No prevertebral fluid or swelling. No visible canal hematoma. Patchy edema within the subcutaneous tissues posterior to the upper thoracic spine may represent soft tissue contusion Disc levels: Mild multilevel degenerative disc disease and facet hypertrophy. Upper chest: No acute findings.  Minimal apical emphysema. Other: Carotid calcifications. IMPRESSION: 1. No acute fracture or subluxation of the cervical spine. 2. Patchy edema within the subcutaneous tissues posterior to the upper thoracic spine may represent soft tissue contusion. Electronically Signed   By: Andrea Gasman M.D.   On: 07/12/2024 15:23   CT Head Wo Contrast Result Date: 07/12/2024 CLINICAL DATA:  fall on thinners EXAM: CT HEAD WITHOUT CONTRAST TECHNIQUE: Contiguous axial images were obtained from the base of the skull through the vertex without intravenous contrast. RADIATION DOSE REDUCTION: This exam was performed according to the departmental dose-optimization program which includes automated exposure control, adjustment of the mA and/or kV according to patient size and/or use of iterative reconstruction technique. COMPARISON:  None Available. FINDINGS: Brain: Acute subdural hemorrhage along the anterior falx. This measures up to 5 mm in thickness, series 2, image 21 there is no associated mass effect. Generalized atrophy and chronic small  vessel ischemic change. No hydrocephalus. No evidence of acute ischemia. Vascular: Atherosclerosis of skullbase vasculature without hyperdense vessel or abnormal calcification. Skull: Left parietal scalp hematoma, but no skull fracture. Sinuses/Orbits: Paranasal  sinuses and mastoid air cells are clear. The visualized orbits are unremarkable. Other: Left parietal scalp hematoma. Traumatic Brain Injury Risk Stratification Skull Fracture: No - Low/mBIG 1 Subdural Hematoma (SDH): 4mm to <75mm - mBIG 2 Subarachnoid Hemorrhage [facility]): No Epidural Hematoma (EDH): No - Low/mBIG 1 Cerebral contusion, intra-axial, intraparenchymal Hemorrhage (IPH): No Intraventricular Hemorrhage (IVH): No - Low/mBIG 1 Midline Shift > 1mm or Edema/effacement of sulci/vents: No - Low/mBIG 1 ---------------------------------------------------- IMPRESSION: 1. Acute subdural hemorrhage along the anterior falx measuring up to 5 mm in thickness. No associated mass effect. 2. Left parietal scalp hematoma. No skull fracture. 3. Generalized atrophy and chronic small vessel ischemic change. Critical Value/emergent results were called by telephone at the time of interpretation on 07/12/2024 at 3:19 pm to provider [facility], who verbally acknowledged these results. Electronically Signed   By: Andrea Gasman M.D.   On: 07/12/2024 15:21    Review of Systems  Constitutional: Negative.   HENT: Negative.    Eyes: Negative.   Respiratory: Negative.    Cardiovascular: Negative.   Gastrointestinal: Negative.   Genitourinary: Negative.   Musculoskeletal: Negative.   Skin: Negative.   Neurological: Negative.   Endo/Heme/Allergies: Negative.   Psychiatric/Behavioral: Negative.     Blood pressure (!) 148/65, pulse 61, temperature (!) 97.5 F (36.4 C), temperature source Oral, resp. rate 18, height 5' 8 (1.727 m), weight 79.4 kg, SpO2 96%. Estimated body mass index is 26.61 kg/m as calculated from the following:   Height as of this encounter: 5' 8  (1.727 m).   Weight as of this encounter: 79.4 kg.  Physical Exam Constitutional:      Appearance: Normal appearance.  HENT:     Head: Normocephalic.     Mouth/Throat:     Mouth: Mucous membranes are moist.     Pharynx: Oropharynx is clear.  Eyes:     Extraocular Movements: Extraocular movements intact.     Pupils: Pupils are equal, round, and reactive to light.  Cardiovascular:     Rate and Rhythm: Normal rate and regular rhythm.     Pulses: Normal pulses.  Pulmonary:     Effort: Pulmonary effort is normal.  Abdominal:     Palpations: Abdomen is soft.  Musculoskeletal:        General: Normal range of motion.     Cervical back: Normal range of motion and neck supple.  Skin:    General: Skin is warm and dry.     Capillary Refill: Capillary refill takes less than 2 seconds.  Neurological:     General: No focal deficit present.     Mental Status: She is alert and oriented to person, place, and time.  Psychiatric:        Mood and Affect: Mood normal.        Behavior: Behavior normal.        Thought Content: Thought content normal.        Judgment: Judgment normal.     Assessment/Plan: Patient sustained a small subdural hematoma along the anterior falx following a fall. No Neurosurgical intervention is warranted. No follow up imaging is needed unless there is a neurological decline. Patient should hold anticoagulation for seven days.  I am in communication with my attending and they agree with the plan for this patient.   Gerard Beck, DNP, AGNP-C Nurse Practitioner  [facility] [address], [place], [place] [address] P: [phone]    F: [phone]  07/14/2024, 9:34 AM

## 2024-07-14 NOTE — Progress Notes (Signed)
 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  Patient admitted on 07/12/2024 after fall which resulted in scalp laceration and subdural hematoma.  Follows with Dr. Vidal.  Sub-dural hematoma with scalp laceration Status post fall -- Patient reports hitting her head when she stepped backwards while playing pickle ball. 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  She has had LLE aspiration thrombectomy with iliac stent and filter placement.  Filter was removed earlier this year. - Has been on anticoagulation, previously on Lovenox .   - More recently on Eliquis  and Plavix  and reports compliance. - Eliquis  is currently on hold.  Seen by neurosurgery who recommend hold for 7 days.  Patient will need lifelong anticoagulation.  Recommend to start anticoagulation again as soon as possible. - Recommend early ambulation and compression socks.  Patient is agreeable to both. - Monitor closely for bleeding - Follows with hematology/Dr. Gudena who will see patient 1 week from discharge.  Patient is agreeable.    Code Status Full   Subjective:  Patient seen awake and alert and oriented x 4 resting comfortably.  Patient's husband sitting at bedside.  Dressing around her head is intact and dry.  She is very pleasant and details what happened necessitating admission.  States that she was playing pickle ball and lost her footing when she stepped backwards.  Unfortunately, she hit her head on a metal pole and began to bleed profusely as she has been on anticoagulation.  Currently denies headaches, dizziness, lightheadedness, nausea or vomiting.  Admits to tenderness at the back of her head. She has been ambulating well and eating well. Admits to chronic  constipation. No acute distress is noted.  Objective:  No intake or output data in the 24 hours ending 07/14/24 1450   PHYSICAL EXAMINATION: ECOG PERFORMANCE STATUS: 1 - Symptomatic but completely ambulatory  Vitals:   07/14/24 0741 07/14/24 1116  BP: (!) 148/65 (!) 143/70  Pulse: 61 (!) 57  Resp: 18 19  Temp: (!) 97.5 F (36.4 C) (!) 97.4 F (36.3 C)  SpO2: 96% 97%   Filed Weights   07/12/24 1433  Weight: 175 lb (79.4 kg)    GENERAL: alert, no distress and comfortable SKIN: +dressing around head dry and intact EYES: normal, conjunctiva are pink and non-injected, sclera clear OROPHARYNX: no exudate, no erythema and lips, buccal mucosa, and tongue normal  NECK: supple, thyroid  normal size, non-tender, without nodularity LYMPH: no palpable lymphadenopathy in the cervical, axillary or inguinal LUNGS: clear to auscultation and percussion with normal breathing effort HEART: regular rate & rhythm and no murmurs and no lower extremity edema ABDOMEN: abdomen soft, non-tender and normal bowel sounds MUSCULOSKELETAL: no cyanosis of digits and no clubbing  PSYCH: alert & oriented x 3 with fluent speech NEURO: no focal motor/sensory deficits   All questions were answered. The patient knows to call the clinic with any problems, questions or concerns.   The total time spent in the appointment was 40 minutes encounter with patient including review of chart and various tests results, discussions about plan of care and coordination of care plan  Olam JINNY Brunner, NP 07/14/2024 2:50 PM    Labs Reviewed:  Lab Results  Component Value Date   WBC 6.0 07/12/2024  HGB 13.3 07/12/2024   HCT 38.2 07/12/2024   MCV 87.0 07/12/2024   PLT 142 (L) 07/12/2024   Recent Labs    03/01/24 0921 03/16/24 0852 03/16/24 1936 07/12/24 1542 07/14/24 0131  NA  --    < > 138 137 142  K  --    < > 4.1 3.7 3.9  CL  --    < > 110 103 108  CO2  --    < > 18* 20* 23  GLUCOSE  --    < > 187* 112* 114*   BUN  --    < > 19 14 13   CREATININE  --    < > 0.82 0.82 0.71  CALCIUM   --    < > 10.7* 11.3* 10.0  GFRNONAA  --    < > >60 >60 >60  PROT 6.6  --   --   --   --   ALBUMIN 4.5  --   --   --   --   AST 25  --   --   --   --   ALT 33  --   --   --   --   ALKPHOS 107  --   --   --   --   BILITOT 0.9  --   --   --   --   BILIDIR 0.1  --   --   --   --    < > = values in this interval not displayed.    Studies Reviewed:  CT Head Wo Contrast Result Date: 07/12/2024 EXAM: CT HEAD WITHOUT CONTRAST 07/12/2024 09:07:00 PM TECHNIQUE: CT of the head was performed without the administration of intravenous contrast. Automated exposure control, iterative reconstruction, and/or weight based adjustment of the mA/kV was utilized to reduce the radiation dose to as low as reasonably achievable. COMPARISON: None available. CLINICAL HISTORY: Subdural hematoma. F/u subdural hematoma FINDINGS: BRAIN AND VENTRICLES: No substantial change in lobulated acute subdural hemorrhage along the anterior falx measuring up to 5 mm in thickness. No evidence of acute infarct. No hydrocephalus. No extra-axial collection. No mass effect or midline shift. ORBITS: No acute abnormality. SINUSES: No acute abnormality. SOFT TISSUES AND SKULL: Left parietal scalp contusion/laceration. No skull fracture. IMPRESSION: 1. No substantial change in lobulated acute subdural hemorrhage along the anterior falx measuring up to 5 mm in thickness. Electronically signed by: Gilmore Molt MD 07/12/2024 09:18 PM EDT RP Workstation: [id number]   US  Venous Img Lower  Left (DVT Study) Result Date: 07/12/2024 CLINICAL DATA:  Pain.  Left lower extremity pain after a fall today. EXAM: Left LOWER EXTREMITY VENOUS DOPPLER ULTRASOUND TECHNIQUE: Gray-scale sonography with compression, as well as color and duplex ultrasound, were performed to evaluate the deep venous system(s) from the level of the common femoral vein through the popliteal and proximal calf veins.  COMPARISON:  Ultrasound 10/06/2023.  CT venogram 07/12/2024 FINDINGS: VENOUS Stent in the left common femoral vein is patent. Saphenofemoral junction is patent. Deep femoral vein is patent. Echogenic noncompressible thrombus demonstrated in the left superficial femoral vein with absence of flow on spectral and color flow Doppler imaging. This corresponds to the area of abnormal flow demonstrated on prior CT venogram. Popliteal vein and visualized calf veins are patent. Limited views of the contralateral common femoral vein are unremarkable. OTHER None. Limitations: none IMPRESSION: Positive examination for deep venous thrombosis involving the left superficial femoral vein. The left common femoral vein stent is patent. Electronically Signed  By: Elsie Gravely M.D.   On: 07/12/2024 21:04   CT VENOGRAM ABD/PEL Result Date: 07/12/2024 CLINICAL DATA:  Deep venous thrombosis.  History of recurrent clots. EXAM: CT VENOGRAM ABDOMEN AND PELVIS TECHNIQUE: Venographic phase images of the abdomen and pelvis were obtained following the administration of intravenous contrast. Multiplanar reformats and maximum intensity projections were generated. RADIATION DOSE REDUCTION: This exam was performed according to the departmental dose-optimization program which includes automated exposure control, adjustment of the mA and/or kV according to patient size and/or use of iterative reconstruction technique. CONTRAST:  OMNIPAQUE  IOHEXOL  350 MG/ML SOLN COMPARISON:  04/22/2024 FINDINGS: Lower chest:  Atelectasis in the lung bases. Hepatobiliary: Diffuse fatty infiltration of the liver. No focal lesions. Gallbladder and bile ducts are normal. Pancreas: Normal pancreas. Spleen: Mild splenic enlargement without focal lesion. Similar prior study. Adrenals/Urinary Tract: No adrenal gland nodules. Kidneys are symmetrical. No hydronephrosis or hydroureter. Unchanged appearance of right renal cysts. No imaging follow-up is indicated.  Bladder is decompressed. No abnormality demonstrated. Stomach/Bowel: Stomach, small bowel, and colon are not abnormally distended. No wall thickening or inflammatory stranding. Colon is diffusely stool-filled. Rectosigmoid anastomosis. Appendix is normal. Vascular/Lymphatic: Calcification of the aorta. No aneurysm. No significant lymphadenopathy. Venogram: Stent in the origin of the left common iliac vein, extending through the external iliac vein to the common femoral vein. The stent appears patent. Hepatic veins, inferior vena cava, iliac, external iliac, and common femoral veins appear patent. Flow appears to be asymmetrically decreased in the left superficial femoral veins compared with the right. This suggest possible deep venous thrombosis although may be artifact due to slower flow. Consider ultrasound for further evaluation. Portal veins and mesenteric veins are patent. Reproductive: No pelvic mass. Other: No free air or free fluid in the abdomen. Abdominal wall musculature appears intact. Musculoskeletal: No acute bony abnormalities. IMPRESSION: 1. Left common iliac/external iliac venous stent appears patent. 2. Asymmetric decreased flow demonstrated in the left superficial femoral vein, possibly venous thrombosis or artifact. Consider ultrasound leg veins for further evaluation. 3. Aortic atherosclerosis. 4. Hepatosplenomegaly with fatty infiltration of the liver. These results were called by telephone at the time of interpretation on 07/12/2024 at 7:39 pm to provider [facility] , who verbally acknowledged these results. Electronically Signed   By: Elsie Gravely M.D.   On: 07/12/2024 19:44   CT Angio Chest PE W and/or Wo Contrast Result Date: 07/12/2024 CLINICAL DATA:  Pulmonary embolus suspected with high probability. EXAM: CT ANGIOGRAPHY CHEST WITH CONTRAST TECHNIQUE: Multidetector CT imaging of the chest was performed using the standard protocol during bolus administration of intravenous  contrast. Multiplanar CT image reconstructions and MIPs were obtained to evaluate the vascular anatomy. RADIATION DOSE REDUCTION: This exam was performed according to the departmental dose-optimization program which includes automated exposure control, adjustment of the mA and/or kV according to patient size and/or use of iterative reconstruction technique. CONTRAST:  OMNIPAQUE  IOHEXOL  350 MG/ML SOLN COMPARISON:  02/08/2024 FINDINGS: Cardiovascular: Technically adequate study with good opacification of the central and segmental pulmonary arteries. Mild motion artifact. No focal filling defects. No evidence of significant pulmonary embolus. Normal heart size. No pericardial effusions. Normal caliber thoracic aorta. No aortic dissection. Great vessel origins are patent. Minimal calcification in the aorta. Mediastinum/Nodes: Esophagus is decompressed. No significant lymphadenopathy. Thyroid  gland is unremarkable. Lungs/Pleura: Motion artifact limits evaluation. Focal plate like and nodular opacities in the right middle lung likely representing scarring. The appearance is unchanged since prior study. Linear scarring also in the lung bases.  No pleural effusion or pneumothorax. Upper Abdomen: No acute abnormalities. Musculoskeletal: No chest wall abnormality. No acute or significant osseous findings. Review of the MIP images confirms the above findings. IMPRESSION: 1. No evidence of significant pulmonary embolus. 2. Persistent finding of plate like and nodular opacities in the right middle lung likely representing chronic scarring. No change since prior study. 3. Mild aortic atherosclerosis. Electronically Signed   By: Elsie Gravely M.D.   On: 07/12/2024 19:29   DG Wrist Complete Right Result Date: 07/12/2024 CLINICAL DATA:  Right thumb pain after a fall while playing pickle ball. EXAM: RIGHT WRIST - COMPLETE 3+ VIEW COMPARISON:  None Available. FINDINGS: There is no evidence of fracture or dislocation. There  is no evidence of arthropathy or other focal bone abnormality. Soft tissues are unremarkable. IMPRESSION: Negative. Electronically Signed   By: Elsie Gravely M.D.   On: 07/12/2024 15:52   CT Cervical Spine Wo Contrast Result Date: 07/12/2024 CLINICAL DATA:  fall on thinners EXAM: CT CERVICAL SPINE WITHOUT CONTRAST TECHNIQUE: Multidetector CT imaging of the cervical spine was performed without intravenous contrast. Multiplanar CT image reconstructions were also generated. RADIATION DOSE REDUCTION: This exam was performed according to the departmental dose-optimization program which includes automated exposure control, adjustment of the mA and/or kV according to patient size and/or use of iterative reconstruction technique. COMPARISON:  None Available. FINDINGS: Alignment: Normal. Skull base and vertebrae: No acute fracture. Vertebral body heights are maintained. The dens and skull base are intact. Incidental bone island within left C4 posterior elements. Soft tissues and spinal canal: No prevertebral fluid or swelling. No visible canal hematoma. Patchy edema within the subcutaneous tissues posterior to the upper thoracic spine may represent soft tissue contusion Disc levels: Mild multilevel degenerative disc disease and facet hypertrophy. Upper chest: No acute findings.  Minimal apical emphysema. Other: Carotid calcifications. IMPRESSION: 1. No acute fracture or subluxation of the cervical spine. 2. Patchy edema within the subcutaneous tissues posterior to the upper thoracic spine may represent soft tissue contusion. Electronically Signed   By: Andrea Gasman M.D.   On: 07/12/2024 15:23   CT Head Wo Contrast Result Date: 07/12/2024 CLINICAL DATA:  fall on thinners EXAM: CT HEAD WITHOUT CONTRAST TECHNIQUE: Contiguous axial images were obtained from the base of the skull through the vertex without intravenous contrast. RADIATION DOSE REDUCTION: This exam was performed according to the departmental  dose-optimization program which includes automated exposure control, adjustment of the mA and/or kV according to patient size and/or use of iterative reconstruction technique. COMPARISON:  None Available. FINDINGS: Brain: Acute subdural hemorrhage along the anterior falx. This measures up to 5 mm in thickness, series 2, image 21 there is no associated mass effect. Generalized atrophy and chronic small vessel ischemic change. No hydrocephalus. No evidence of acute ischemia. Vascular: Atherosclerosis of skullbase vasculature without hyperdense vessel or abnormal calcification. Skull: Left parietal scalp hematoma, but no skull fracture. Sinuses/Orbits: Paranasal sinuses and mastoid air cells are clear. The visualized orbits are unremarkable. Other: Left parietal scalp hematoma. Traumatic Brain Injury Risk Stratification Skull Fracture: No - Low/mBIG 1 Subdural Hematoma (SDH): 4mm to <67mm - mBIG 2 Subarachnoid Hemorrhage [facility]): No Epidural Hematoma (EDH): No - Low/mBIG 1 Cerebral contusion, intra-axial, intraparenchymal Hemorrhage (IPH): No Intraventricular Hemorrhage (IVH): No - Low/mBIG 1 Midline Shift > 1mm or Edema/effacement of sulci/vents: No - Low/mBIG 1 ---------------------------------------------------- IMPRESSION: 1. Acute subdural hemorrhage along the anterior falx measuring up to 5 mm in thickness. No associated mass effect. 2. Left parietal scalp hematoma.  No skull fracture. 3. Generalized atrophy and chronic small vessel ischemic change. Critical Value/emergent results were called by telephone at the time of interpretation on 07/12/2024 at 3:19 pm to provider [facility], who verbally acknowledged these results. Electronically Signed   By: Andrea Gasman M.D.   On: 07/12/2024 15:21

## 2024-07-14 NOTE — Discharge Instructions (Addendum)
 Heather Hudson,  You were in the hospital with a brain bleed from a head injury. Unfortunately this is complicated by your Plavix  and Eliquis  use. These medications have been held with recommendation to hold for a total of 1 week. Your hematologist has recommended that you wear compression stockings while you are off the blood thinner medication to reduce the risk of recurrent clot. Please wear these compression stockings when you are up and about; take them off when going to bed at night. Please follow-up with your PCP and your hematologist. Your staples should be removed about 1 week from when they were placed; this can be managed at your PCP's office.

## 2024-07-14 NOTE — Discharge Summary (Signed)
 Physician Discharge Summary   Patient: Heather Hudson MRN: [id number] DOB: 05-09-1956  Admit date:     07/12/2024  Discharge date: 07/14/24  Discharge Physician: Elgin Lam, MD   PCP: Gretta Comer POUR, NP   Recommendations at discharge:  PCP visit for hospital follow-up Hematology visit for hospital follow-up Remove five (5) scalp staples in 7-10 days  Discharge Diagnoses: Principal Problem:   Subdural hematoma (HCC) Active Problems:   HTN (hypertension)   May-Thurner syndrome  Resolved Problems:   * No resolved hospital problems. *  Hospital Course: Heather Hudson is a 68 y.o. 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  Patient presented secondary to a fall with scalp laceration and found to have evidence of a small subdural hemorrhage. Eliquis  and Plavix  held. Neurosurgery consulted. Repeat CT head with stable 5 mm SDH. Hematology consulted as well. Recommendation from neurosurgery to hold anticoagulation for 7 days total. Patient discharged with compression stockings.  Assessment and Plan:  Subdural hematoma Secondary to trauma and complicated by Eliquis  and Plavix  use. Measuring 5 mm and stable on repeat CT scan. Eliquis  and Plavix  held. Neurosurgery consulted and recommend a total of a 7-day hold of anticoagulation.  Scalp laceration Secondary to trauma. 5 staples placed in the ED. Wound care consulted. Wound care recommendations (10/9): Remove old dressing (can soak with saline for ease of removal) Cleanse with NS, pat dry.  Place Xeroform gauze over wound bed, cover with dry gauze and wrap with Kerlix or stretch gauze which ever is preferred.  Change daily.   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 Patient with initial history of LLE DVT and PE in August 2024, treated with anticoagulation and LLE aspiration  thrombectomy with iliac stent and filter placement. Course complicated by in-stent thrombosis requiring recannulization and revision. She has been managed on Eliquis  and Plavix  which are on hold secondary to her subdural hemorrhage. Hematology recommends compression stockings and quick resumption of anticoagulation as able.   Primary hypertension Patient is on losartan  as an outpatient. Continue losartan .  GERD Continue Prilosec.   Consultants: Neurosurgery, Hematology Procedures performed: None  Disposition: Home Diet recommendation: Regular diet   DISCHARGE MEDICATION: Allergies as of 07/14/2024       Reactions   Metformin  And Related Nausea And Vomiting   Aspirin Hives   Ibuprofen Hives   Other    Anesthesia (familial) SISTER HAD MALIGNANT HYPERTHERMIA   Propofol  Other (See Comments)   Mother had issues with it        Medication List     PAUSE taking these medications    clopidogrel  75 MG tablet Wait to take this until: July 20, 2024 Commonly known as: PLAVIX  Take 75 mg by mouth daily.   Eliquis  5 MG Tabs tablet Wait to take this until: July 20, 2024 Generic drug: apixaban  Take 1 tablet (5 mg total) by mouth 2 (two) times daily.       TAKE these medications    Calcium  Carb-Cholecalciferol 600-12.5 MG-MCG Tabs Take 1 tablet by mouth daily.   folic acid  1 MG tablet Commonly known as: FOLVITE  Take 2 mg by mouth daily.   losartan  25 MG tablet Commonly known as: COZAAR  TAKE 1 TABLET BY MOUTH EVERY DAY FOR BLOOD PRESSURE   omeprazole 20 MG capsule Commonly known as: PRILOSEC Take 20 mg by mouth daily.  Discharge Exam: BP (!) 143/70 (BP Location: Left Arm)   Pulse (!) 57   Temp (!) 97.4 F (36.3 C) (Oral)   Resp 19   Ht 5' 8 (1.727 m)   Wt 79.4 kg   SpO2 97%   BMI 26.61 kg/m   General exam: Appears calm and comfortable. Respiratory system: Clear to auscultation. Respiratory effort normal. Cardiovascular system: S1 & S2 heard,  RRR. No murmur. Gastrointestinal system: Abdomen is nondistended, soft and nontender. Normal bowel sounds heard. Central nervous system: Alert and oriented. No focal neurological deficits. Musculoskeletal: No edema. No calf tenderness Psychiatry: Judgement and insight appear normal. Mood & affect appropriate.   Condition at discharge: stable  The results of significant diagnostics from this hospitalization (including imaging, microbiology, ancillary and laboratory) are listed below for reference.   Imaging Studies: CT Head Wo Contrast Result Date: 07/12/2024 EXAM: CT HEAD WITHOUT CONTRAST 07/12/2024 09:07:00 PM TECHNIQUE: CT of the head was performed without the administration of intravenous contrast. Automated exposure control, iterative reconstruction, and/or weight based adjustment of the mA/kV was utilized to reduce the radiation dose to as low as reasonably achievable. COMPARISON: None available. CLINICAL HISTORY: Subdural hematoma. F/u subdural hematoma FINDINGS: BRAIN AND VENTRICLES: No substantial change in lobulated acute subdural hemorrhage along the anterior falx measuring up to 5 mm in thickness. No evidence of acute infarct. No hydrocephalus. No extra-axial collection. No mass effect or midline shift. ORBITS: No acute abnormality. SINUSES: No acute abnormality. SOFT TISSUES AND SKULL: Left parietal scalp contusion/laceration. No skull fracture. IMPRESSION: 1. No substantial change in lobulated acute subdural hemorrhage along the anterior falx measuring up to 5 mm in thickness. Electronically signed by: Gilmore Molt MD 07/12/2024 09:18 PM EDT RP Workstation: [id number]   US  Venous Img Lower  Left (DVT Study) Result Date: 07/12/2024 CLINICAL DATA:  Pain.  Left lower extremity pain after a fall today. EXAM: Left LOWER EXTREMITY VENOUS DOPPLER ULTRASOUND TECHNIQUE: Gray-scale sonography with compression, as well as color and duplex ultrasound, were performed to evaluate the deep venous  system(s) from the level of the common femoral vein through the popliteal and proximal calf veins. COMPARISON:  Ultrasound 10/06/2023.  CT venogram 07/12/2024 FINDINGS: VENOUS Stent in the left common femoral vein is patent. Saphenofemoral junction is patent. Deep femoral vein is patent. Echogenic noncompressible thrombus demonstrated in the left superficial femoral vein with absence of flow on spectral and color flow Doppler imaging. This corresponds to the area of abnormal flow demonstrated on prior CT venogram. Popliteal vein and visualized calf veins are patent. Limited views of the contralateral common femoral vein are unremarkable. OTHER None. Limitations: none IMPRESSION: Positive examination for deep venous thrombosis involving the left superficial femoral vein. The left common femoral vein stent is patent. Electronically Signed   By: Elsie Gravely M.D.   On: 07/12/2024 21:04   CT VENOGRAM ABD/PEL Result Date: 07/12/2024 CLINICAL DATA:  Deep venous thrombosis.  History of recurrent clots. EXAM: CT VENOGRAM ABDOMEN AND PELVIS TECHNIQUE: Venographic phase images of the abdomen and pelvis were obtained following the administration of intravenous contrast. Multiplanar reformats and maximum intensity projections were generated. RADIATION DOSE REDUCTION: This exam was performed according to the departmental dose-optimization program which includes automated exposure control, adjustment of the mA and/or kV according to patient size and/or use of iterative reconstruction technique. CONTRAST:  OMNIPAQUE  IOHEXOL  350 MG/ML SOLN COMPARISON:  04/22/2024 FINDINGS: Lower chest:  Atelectasis in the lung bases. Hepatobiliary: Diffuse fatty infiltration of the liver. No focal lesions. Gallbladder  and bile ducts are normal. Pancreas: Normal pancreas. Spleen: Mild splenic enlargement without focal lesion. Similar prior study. Adrenals/Urinary Tract: No adrenal gland nodules. Kidneys are symmetrical. No  hydronephrosis or hydroureter. Unchanged appearance of right renal cysts. No imaging follow-up is indicated. Bladder is decompressed. No abnormality demonstrated. Stomach/Bowel: Stomach, small bowel, and colon are not abnormally distended. No wall thickening or inflammatory stranding. Colon is diffusely stool-filled. Rectosigmoid anastomosis. Appendix is normal. Vascular/Lymphatic: Calcification of the aorta. No aneurysm. No significant lymphadenopathy. Venogram: Stent in the origin of the left common iliac vein, extending through the external iliac vein to the common femoral vein. The stent appears patent. Hepatic veins, inferior vena cava, iliac, external iliac, and common femoral veins appear patent. Flow appears to be asymmetrically decreased in the left superficial femoral veins compared with the right. This suggest possible deep venous thrombosis although may be artifact due to slower flow. Consider ultrasound for further evaluation. Portal veins and mesenteric veins are patent. Reproductive: No pelvic mass. Other: No free air or free fluid in the abdomen. Abdominal wall musculature appears intact. Musculoskeletal: No acute bony abnormalities. IMPRESSION: 1. Left common iliac/external iliac venous stent appears patent. 2. Asymmetric decreased flow demonstrated in the left superficial femoral vein, possibly venous thrombosis or artifact. Consider ultrasound leg veins for further evaluation. 3. Aortic atherosclerosis. 4. Hepatosplenomegaly with fatty infiltration of the liver. These results were called by telephone at the time of interpretation on 07/12/2024 at 7:39 pm to provider [facility] , who verbally acknowledged these results. Electronically Signed   By: Elsie Gravely M.D.   On: 07/12/2024 19:44   CT Angio Chest PE W and/or Wo Contrast Result Date: 07/12/2024 CLINICAL DATA:  Pulmonary embolus suspected with high probability. EXAM: CT ANGIOGRAPHY CHEST WITH CONTRAST TECHNIQUE: Multidetector CT  imaging of the chest was performed using the standard protocol during bolus administration of intravenous contrast. Multiplanar CT image reconstructions and MIPs were obtained to evaluate the vascular anatomy. RADIATION DOSE REDUCTION: This exam was performed according to the departmental dose-optimization program which includes automated exposure control, adjustment of the mA and/or kV according to patient size and/or use of iterative reconstruction technique. CONTRAST:  OMNIPAQUE  IOHEXOL  350 MG/ML SOLN COMPARISON:  02/08/2024 FINDINGS: Cardiovascular: Technically adequate study with good opacification of the central and segmental pulmonary arteries. Mild motion artifact. No focal filling defects. No evidence of significant pulmonary embolus. Normal heart size. No pericardial effusions. Normal caliber thoracic aorta. No aortic dissection. Great vessel origins are patent. Minimal calcification in the aorta. Mediastinum/Nodes: Esophagus is decompressed. No significant lymphadenopathy. Thyroid  gland is unremarkable. Lungs/Pleura: Motion artifact limits evaluation. Focal plate like and nodular opacities in the right middle lung likely representing scarring. The appearance is unchanged since prior study. Linear scarring also in the lung bases. No pleural effusion or pneumothorax. Upper Abdomen: No acute abnormalities. Musculoskeletal: No chest wall abnormality. No acute or significant osseous findings. Review of the MIP images confirms the above findings. IMPRESSION: 1. No evidence of significant pulmonary embolus. 2. Persistent finding of plate like and nodular opacities in the right middle lung likely representing chronic scarring. No change since prior study. 3. Mild aortic atherosclerosis. Electronically Signed   By: Elsie Gravely M.D.   On: 07/12/2024 19:29   DG Wrist Complete Right Result Date: 07/12/2024 CLINICAL DATA:  Right thumb pain after a fall while playing pickle ball. EXAM: RIGHT WRIST -  COMPLETE 3+ VIEW COMPARISON:  None Available. FINDINGS: There is no evidence of fracture or dislocation. There is no  evidence of arthropathy or other focal bone abnormality. Soft tissues are unremarkable. IMPRESSION: Negative. Electronically Signed   By: Elsie Gravely M.D.   On: 07/12/2024 15:52   CT Cervical Spine Wo Contrast Result Date: 07/12/2024 CLINICAL DATA:  fall on thinners EXAM: CT CERVICAL SPINE WITHOUT CONTRAST TECHNIQUE: Multidetector CT imaging of the cervical spine was performed without intravenous contrast. Multiplanar CT image reconstructions were also generated. RADIATION DOSE REDUCTION: This exam was performed according to the departmental dose-optimization program which includes automated exposure control, adjustment of the mA and/or kV according to patient size and/or use of iterative reconstruction technique. COMPARISON:  None Available. FINDINGS: Alignment: Normal. Skull base and vertebrae: No acute fracture. Vertebral body heights are maintained. The dens and skull base are intact. Incidental bone island within left C4 posterior elements. Soft tissues and spinal canal: No prevertebral fluid or swelling. No visible canal hematoma. Patchy edema within the subcutaneous tissues posterior to the upper thoracic spine may represent soft tissue contusion Disc levels: Mild multilevel degenerative disc disease and facet hypertrophy. Upper chest: No acute findings.  Minimal apical emphysema. Other: Carotid calcifications. IMPRESSION: 1. No acute fracture or subluxation of the cervical spine. 2. Patchy edema within the subcutaneous tissues posterior to the upper thoracic spine may represent soft tissue contusion. Electronically Signed   By: Andrea Gasman M.D.   On: 07/12/2024 15:23   CT Head Wo Contrast Result Date: 07/12/2024 CLINICAL DATA:  fall on thinners EXAM: CT HEAD WITHOUT CONTRAST TECHNIQUE: Contiguous axial images were obtained from the base of the skull through the vertex without  intravenous contrast. RADIATION DOSE REDUCTION: This exam was performed according to the departmental dose-optimization program which includes automated exposure control, adjustment of the mA and/or kV according to patient size and/or use of iterative reconstruction technique. COMPARISON:  None Available. FINDINGS: Brain: Acute subdural hemorrhage along the anterior falx. This measures up to 5 mm in thickness, series 2, image 21 there is no associated mass effect. Generalized atrophy and chronic small vessel ischemic change. No hydrocephalus. No evidence of acute ischemia. Vascular: Atherosclerosis of skullbase vasculature without hyperdense vessel or abnormal calcification. Skull: Left parietal scalp hematoma, but no skull fracture. Sinuses/Orbits: Paranasal sinuses and mastoid air cells are clear. The visualized orbits are unremarkable. Other: Left parietal scalp hematoma. Traumatic Brain Injury Risk Stratification Skull Fracture: No - Low/mBIG 1 Subdural Hematoma (SDH): 4mm to <44mm - mBIG 2 Subarachnoid Hemorrhage [facility]): No Epidural Hematoma (EDH): No - Low/mBIG 1 Cerebral contusion, intra-axial, intraparenchymal Hemorrhage (IPH): No Intraventricular Hemorrhage (IVH): No - Low/mBIG 1 Midline Shift > 1mm or Edema/effacement of sulci/vents: No - Low/mBIG 1 ---------------------------------------------------- IMPRESSION: 1. Acute subdural hemorrhage along the anterior falx measuring up to 5 mm in thickness. No associated mass effect. 2. Left parietal scalp hematoma. No skull fracture. 3. Generalized atrophy and chronic small vessel ischemic change. Critical Value/emergent results were called by telephone at the time of interpretation on 07/12/2024 at 3:19 pm to provider [facility], who verbally acknowledged these results. Electronically Signed   By: Andrea Gasman M.D.   On: 07/12/2024 15:21    Microbiology: Results for orders placed or performed during the hospital encounter of 10/22/23  Surgical pcr screen      Status: None   Collection Time: 10/22/23  8:19 AM   Specimen: Nasal Mucosa; Nasal Swab  Result Value Ref Range Status   MRSA, PCR NEGATIVE NEGATIVE Final   Staphylococcus aureus NEGATIVE NEGATIVE Final    Comment: (NOTE) The Xpert SA Assay (FDA approved for  NASAL specimens in patients 52 years of age and older), is one component of a comprehensive surveillance program. It is not intended to diagnose infection nor to guide or monitor treatment. Performed at [facility], [address], [place], [place] [address]     Labs: CBC: Recent Labs  Lab 07/12/24 1542  WBC 6.0  HGB 13.3  HCT 38.2  MCV 87.0  PLT 142*   Basic Metabolic Panel: Recent Labs  Lab 07/12/24 1542 07/14/24 0131  NA 137 142  K 3.7 3.9  CL 103 108  CO2 20* 23  GLUCOSE 112* 114*  BUN 14 13  CREATININE 0.82 0.71  CALCIUM  11.3* 10.0    Discharge time spent: 35 minutes.  Signed: Elgin Lam, MD [facility] 07/14/2024

## 2024-07-14 NOTE — Progress Notes (Signed)
Discharge instructions provided to patient. All medications, follow up appointments, and discharge instructions discussed. IV out. Monitor off CCMD notified. Discharging home 

## 2024-07-15 ENCOUNTER — Other Ambulatory Visit: Payer: Self-pay | Admitting: Interventional Radiology

## 2024-07-15 ENCOUNTER — Telehealth: Payer: Self-pay | Admitting: Hematology and Oncology

## 2024-07-15 DIAGNOSIS — I824Z2 Acute embolism and thrombosis of unspecified deep veins of left distal lower extremity: Secondary | ICD-10-CM

## 2024-07-15 NOTE — Telephone Encounter (Signed)
 Left vm for patient about scheduled appt date and time 10/14. Encouraged to call back if need to reschedule

## 2024-07-19 ENCOUNTER — Inpatient Hospital Stay: Attending: Hematology and Oncology | Admitting: Hematology and Oncology

## 2024-07-19 VITALS — BP 132/80 | HR 63 | Temp 97.3°F | Resp 18 | Wt 183.9 lb

## 2024-07-19 DIAGNOSIS — D720 Genetic anomalies of leukocytes: Secondary | ICD-10-CM | POA: Insufficient documentation

## 2024-07-19 DIAGNOSIS — Z7901 Long term (current) use of anticoagulants: Secondary | ICD-10-CM | POA: Diagnosis not present

## 2024-07-19 DIAGNOSIS — I82412 Acute embolism and thrombosis of left femoral vein: Secondary | ICD-10-CM | POA: Insufficient documentation

## 2024-07-19 DIAGNOSIS — D696 Thrombocytopenia, unspecified: Secondary | ICD-10-CM | POA: Diagnosis not present

## 2024-07-19 NOTE — Assessment & Plan Note (Signed)
 Acute pulmonary embolism and bilateral lower extremity DVT: 05/14/2023: Acute bilateral PE involving both main pulmonary arteries, numerous lobar segmental subsegmental arteries with right heart strain right middle lobe wedge-shaped.  consolidation: Infarcts   No clear-cut predisposing factors. (Prior history of COVID-19, autoimmune/mucocutaneous pemphigoid, on prednisone  and methotrexate ) Status post thrombectomy and thrombolysis   Hypercoagulability workup: Negative for factor V Leiden gene mutation, normal protein C protein S and Antithrombin levels.  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  Successful IVC filter retrieval and thrombectomy of indwelling ileal stents, left common femoral vein stent placement  Hospitalization: 07/12/2024-07/14/2024: Subdural hematoma after a fall playing pickle ball (anticoagulation was held for 7 days)  Treatment plan: Recommend resumption of anticoagulation with Eliquis  Follow-up closely with neurosurgery

## 2024-07-19 NOTE — Progress Notes (Signed)
 Patient Care Team: Gretta Comer POUR, NP as PCP - General (Internal Medicine)  DIAGNOSIS:  Encounter Diagnosis  Name Primary?   Acute deep vein thrombosis (DVT) of femoral vein of left lower extremity (HCC) Yes      CHIEF COMPLIANT: Follow-up after recent hospitalization for subdural hematoma (was on anticoagulation)  HISTORY OF PRESENT ILLNESS:   History of Present Illness Heather Hudson is a 68 year old female with a history of blood clots who presents for follow-up after a head injury sustained during a fall while playing pickleball.  A week ago, she sustained a head injury while playing pickleball, resulting in a laceration that required five staples. She is scheduled to have the staples removed on Thursday. She has been off Eliquis  since the incident. She experiences only slight discomfort and no significant headaches.  She takes Eliquis  twice daily for blood clots, with a history of a clot found in her stent. She was hospitalized last year for blood clots. Her A1c was high during that time, leading to a brief period on metformin , which she did not tolerate. Her A1c improved after discharge, and she discontinued metformin .  Recent blood work showed slightly elevated calcium  levels, which normalized upon retesting. She takes daily calcium  tablets with vitamin D3. Her liver enzymes were normal during her last check in May 2025.     ALLERGIES:  is allergic to metformin  and related, aspirin, ibuprofen, other, and propofol .  MEDICATIONS:  Current Outpatient Medications  Medication Sig Dispense Refill   losartan  (COZAAR ) 25 MG tablet TAKE 1 TABLET BY MOUTH EVERY DAY FOR BLOOD PRESSURE 90 tablet 2   omeprazole (PRILOSEC) 20 MG capsule Take 20 mg by mouth daily.     [Paused] apixaban  (ELIQUIS ) 5 MG TABS tablet Take 1 tablet (5 mg total) by mouth 2 (two) times daily. 60 tablet 12   Calcium  Carb-Cholecalciferol 600-12.5 MG-MCG TABS Take 1 tablet by mouth daily.      [Paused] clopidogrel  (PLAVIX ) 75 MG tablet Take 75 mg by mouth daily.     folic acid  (FOLVITE ) 1 MG tablet Take 2 mg by mouth daily. (Patient not taking: Reported on 07/19/2024)     No current facility-administered medications for this visit.   Facility-Administered Medications Ordered in Other Visits  Medication Dose Route Frequency Provider Last Rate Last Admin   Chlorhexidine  Gluconate Cloth 2 % PADS 6 each  6 each Topical Daily Huneycutt, [place], NP        PHYSICAL EXAMINATION: ECOG PERFORMANCE STATUS: 1 - Symptomatic but completely ambulatory  Vitals:   07/19/24 1018  BP: 132/80  Pulse: 63  Resp: 18  Temp: (!) 97.3 F (36.3 C)  SpO2: 99%   Filed Weights   07/19/24 1018  Weight: 183 lb 14.4 oz (83.4 kg)      LABORATORY DATA:  I have reviewed the data as listed    Latest Ref Rng & Units 07/14/2024    1:31 AM 07/12/2024    3:42 PM 03/16/2024    7:36 PM  CMP  Glucose 70 - 99 mg/dL 885  887  812   BUN 8 - 23 mg/dL 13  14  19    Creatinine 0.44 - 1.00 mg/dL 9.28  9.17  9.17   Sodium 135 - 145 mmol/L 142  137  138   Potassium 3.5 - 5.1 mmol/L 3.9  3.7  4.1   Chloride 98 - 111 mmol/L 108  103  110   CO2 22 - 32 mmol/L 23  20  18   Calcium  8.9 - 10.3 mg/dL 89.9  88.6  89.2     Lab Results  Component Value Date   WBC 6.0 07/12/2024   HGB 13.3 07/12/2024   HCT 38.2 07/12/2024   MCV 87.0 07/12/2024   PLT 142 (L) 07/12/2024   NEUTROABS 4.7 03/16/2024    ASSESSMENT & PLAN:  Deep vein thrombosis (DVT) of femoral vein of left lower extremity (HCC) Acute pulmonary embolism and bilateral lower extremity DVT: 05/14/2023: Acute bilateral PE involving both main pulmonary arteries, numerous lobar segmental subsegmental arteries with right heart strain right middle lobe wedge-shaped.  consolidation: Infarcts   No clear-cut predisposing factors. (Prior history of COVID-19, autoimmune/mucocutaneous pemphigoid, on prednisone  and methotrexate ) Status post thrombectomy and  thrombolysis   Hypercoagulability workup: Negative for factor V Leiden gene mutation, normal protein C protein S and Antithrombin levels.  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 - Resume Apixaban  (Eliquis ) 5 mg oral bid after staple removal on Thursday. - Follow up with Dr. Nancee on November 10th for an ultrasound to monitor the clot in the stent.  Recent scalp laceration with staples, now healing - Remove staples on Thursday. - Keep the wound covered with a bandage until staple removal.  Mild thrombocytopenia Platelet count slightly below normal at 142, not concerning as it is above bleeding risk threshold. - Monitor platelet count; no immediate intervention required.      No orders of the defined types were placed in this encounter.  The patient has a good understanding of the overall plan. she agrees with it. she will call with any problems that may develop before the next visit here.  I personally spent a total of 30 minutes in the care of the patient today including preparing to see the patient, getting/reviewing separately obtained history, performing a medically appropriate  exam/evaluation, counseling and educating, placing orders, referring and communicating with other health care professionals, documenting clinical information in the EHR, independently interpreting results, communicating results, and coordinating care.   Viinay K Ichael Pullara, MD 07/19/24

## 2024-07-21 ENCOUNTER — Encounter: Payer: Self-pay | Admitting: Primary Care

## 2024-07-21 ENCOUNTER — Ambulatory Visit (INDEPENDENT_AMBULATORY_CARE_PROVIDER_SITE_OTHER): Admitting: Primary Care

## 2024-07-21 VITALS — BP 130/80 | HR 57 | Temp 98.0°F | Ht 68.0 in | Wt 178.2 lb

## 2024-07-21 DIAGNOSIS — S065XAA Traumatic subdural hemorrhage with loss of consciousness status unknown, initial encounter: Secondary | ICD-10-CM | POA: Diagnosis not present

## 2024-07-21 NOTE — Patient Instructions (Signed)
 It was a pleasure to see you today!

## 2024-07-21 NOTE — Progress Notes (Signed)
 Subjective:    Patient ID: [name], female    DOB: 1956-04-04, 68 y.o.   MRN: [id number]  KAHLANI GRABER is a very pleasant 68 y.o. female with a history of hypertension, May-Thurner syndrome, recurrent DVT, hyperlipidemia, mucous membrane pemphigoid who presents today for hospital follow-up.  She presented to [facility] on 07/12/2024 for a fall.  She was playing pickle ball, lost her balance falling backwards and striking her head on a metal post.  Also with thumb and radial wrist pain.    During her stay in the ED she underwent CT head and C-spine which showed 5 mm SDH along anterior falx.  Eliquis  was held.  She underwent CTA PE study, CT venogram which showed left common iliac/external iliac venous stent.  Patent with decreased blood flow to the left superficial femoral vein potential thrombosis.  There was no sign of PE.  She underwent venous ultrasound which showed DVT to the left superficial femoral vein.  She underwent repeat CT head 6 hours later which was stable.  She was [facility] to [facility] for further evaluation.  Neurosurgery consulted who recommended a 7-day hold of anticoagulation.  Hematology consulted who recommended compression socks and to resume anticoagulation as soon as possible.  She was discharged home on 07/14/2024.  Since her discharge home she has been evaluated by her hematologist.  Instructions were to resume Eliquis  5 mg twice daily post staple removal today.  She is also scheduled with vascular services on November 10 for an ultrasound to monitor the clot within her stent.  Last night while blow drying her hair she accidentally pulled the wound slightly. She wraps the site of the wound with a bandage and woke up last night noticing some bleeding. She's unsure if her staples should come out today. She has not been taking her Plavix  and Eliquis  as instructed.  She denies dizziness, headaches, visual changes.  BP Readings from Last 3 Encounters:   07/21/24 130/80  07/19/24 132/80  07/14/24 134/82     Review of Systems  Eyes:  Negative for visual disturbance.  Respiratory:  Negative for shortness of breath.   Cardiovascular:  Negative for chest pain.  Neurological:  Negative for dizziness and headaches.         Past Medical History:  Diagnosis Date   Acute pulmonary embolism (HCC) 05/14/2023   Adenomatous colon polyp 2015   Allergy 1974   teenager, and 2008   Colovesical fistula 2015   COVID-19 06/2020   Environmental and seasonal allergies    Family history of adverse reaction to anesthesia    Sister with Malignant hyperthermia   Hx of adenomatous colonic polyps 01/19/2021   Hypertension 11/23   Low dose prescription   Lower abdominal pain 12/23/2019   Malignant hyperthermia    sister   Mass of soft tissue of abdomen 08/18/2023   May-Thurner syndrome    Migraines    Motion sickness    Persistent cough for 3 weeks or longer 10/10/2021   Pulmonary embolism (HCC) 05/14/2023    Social History   Socioeconomic History   Marital status: Married    Spouse name: Lamar   Number of children: Not on file   Years of education: Not on file   Highest education level: Associate degree: occupational, Scientist, product/process development, or vocational program  Occupational History   Not on file  Tobacco Use   Smoking status: Never   Smokeless tobacco: Never   Tobacco comments:    Never smoked  Vaping Use   Vaping status: Never Used  Substance and Sexual Activity   Alcohol use: Yes    Alcohol/week: 5.0 standard drinks of alcohol    Types: 5 Glasses of wine per week   Drug use: Never   Sexual activity: Yes    Birth control/protection: Post-menopausal  Other Topics Concern   Not on file  Social History Narrative   Married.  No children.   Moved from [place] area of [place]  in 2018    Worked in [employer], Insurance account manager.  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   Enjoys wine tasting.    1-2 caffeinated drinks a day 0-1 alcoholic never smoker no drug use   Sister Philippe works Adult nurse endoscopy as Charity fundraiser   Social Drivers of Corporate investment banker Strain: Low Risk  (03/17/2024)   Overall Financial Resource Strain (CARDIA)    Difficulty of Paying Living Expenses: Not hard at all  Food Insecurity: No Food Insecurity (07/14/2024)   Hunger Vital Sign    Worried About Running Out of Food in the Last Year: Never true    Ran Out of Food in the Last Year: Never true  Transportation Needs: No Transportation Needs (07/14/2024)   PRAPARE - Administrator, Civil Service (Medical): No    Lack of Transportation (Non-Medical): No  Physical Activity: Insufficiently Active (03/17/2024)   Exercise Vital Sign    Days of Exercise per Week: 1 day    Minutes of Exercise per Session: 30 min  Stress: No Stress Concern Present (03/17/2024)   [employer] of Occupational Health - Occupational Stress Questionnaire    Feeling of Stress: Not at all  Social Connections: Socially Integrated (07/14/2024)   Social Connection and Isolation Panel    Frequency of Communication with Friends and Family: More than three times a week    Frequency of Social Gatherings with Friends and Family: More than three times a week    Attends Religious Services: More than 4 times per year    Active Member of Clubs or Organizations: Yes    Attends Engineer, structural: More than 4 times per year    Marital Status: Married  Catering manager Violence: Not At Risk (03/17/2024)   Humiliation, Afraid, Rape, and Kick questionnaire    Fear of Current or Ex-Partner: No    Emotionally Abused: No    Physically Abused: No    Sexually Abused: No    Past Surgical History:  Procedure Laterality Date   COLON SURGERY  2016   fistula, small section removed   COLONOSCOPY  2015   Plus others   IR FEM POP ART STENT INC PTA MOD SED  10/22/2023   IR ILIAC ART PTA UNI  INITIAL MOD SED  03/17/2024   IR INTRAVASCULAR ULTRASOUND NON CORONARY  05/19/2023   IR INTRAVASCULAR ULTRASOUND NON CORONARY  07/16/2023   IR INTRAVASCULAR ULTRASOUND NON CORONARY  03/16/2024   IR IVC FILTER PLMT / S&I /IMG GUID/MOD SED  05/19/2023   IR IVC FILTER RETRIEVAL / S&I /IMG GUID/MOD SED  10/22/2023   IR IVUS EACH ADDITIONAL NON CORONARY VESSEL  09/15/2023   IR PTA VENOUS EXCEPT DIALYSIS CIRCUIT  05/19/2023   IR PTA VENOUS EXCEPT DIALYSIS CIRCUIT  07/16/2023   IR RADIOLOGIST EVAL & MGMT  07/01/2023   IR RADIOLOGIST EVAL & MGMT  08/21/2023   IR RADIOLOGIST EVAL & MGMT  11/13/2023   IR RADIOLOGIST EVAL & MGMT  02/22/2024   IR RADIOLOGIST EVAL & MGMT  05/02/2024   IR THROMBECT VENO MECH MOD SED  05/19/2023   IR THROMBECT VENO MECH MOD SED  07/16/2023   IR THROMBECT VENO MECH MOD SED  10/22/2023   IR THROMBECT VENO MECH MOD SED  03/16/2024   IR TRANSCATH PLC STENT 1ST ART NOT LE CV CAR VERT CAR  05/19/2023   IR TRANSCATH PLC STENT 1ST ART NOT LE CV CAR VERT CAR  07/16/2023   IR US  GUIDE VASC ACCESS LEFT  05/19/2023   IR US  GUIDE VASC ACCESS LEFT  07/16/2023   IR US  GUIDE VASC ACCESS RIGHT  05/19/2023   IR US  GUIDE VASC ACCESS RIGHT  09/15/2023   IR US  GUIDE VASC ACCESS RIGHT  03/16/2024   IR VENO/EXT/UNI LEFT  05/19/2023   IR VENO/EXT/UNI LEFT  07/16/2023   IR VENO/EXT/UNI LEFT  10/22/2023   IR VENO/EXT/UNI LEFT  03/16/2024   IR VENOCAVAGRAM IVC  05/19/2023   IR VENOCAVAGRAM IVC  07/16/2023   IR VENOCAVAGRAM IVC  09/15/2023   IR VENOCAVAGRAM IVC  10/22/2023   IR VENOCAVAGRAM IVC  03/16/2024   LAPAROSCOPIC LEFT COLON RESECTION  2015   Colovesical fistula, [place]   Left peroneal tendon repair Left 2023   RADIOLOGY WITH ANESTHESIA N/A 10/22/2023   Procedure: LLE venous thrombectomy;  Surgeon: Jennefer Ester PARAS, MD;  Location: [facility] OR;  Service: Radiology;  Laterality: N/A;   RADIOLOGY WITH ANESTHESIA N/A 03/16/2024   Procedure: RADIOLOGY WITH ANESTHESIA;  Surgeon: Jennefer Ester PARAS, MD;  Location: [facility]  OR;  Service: Radiology;  Laterality: N/A;  Left iliofemoral vein stent thrombectomy   WISDOM TOOTH EXTRACTION      Family History  Problem Relation Age of Onset   Heart disease Mother    Diabetes Mother    Hearing loss Mother    Heart disease Father    Prostate cancer Father    Alcohol abuse Father    Cancer Father    COPD Father    Malignant hyperthermia Sister    Pancreatic cancer Maternal Aunt    Diabetes Brother    Cancer Maternal Grandmother    Miscarriages / Stillbirths Maternal Grandmother    Diabetes Brother    Intellectual disability Brother    Learning disabilities Brother    Vision loss Brother    Miscarriages / Stillbirths Sister    Miscarriages / [place] Sister    Colon cancer Neg Hx    Esophageal cancer Neg Hx    Stomach cancer Neg Hx    Liver disease Neg Hx    Breast cancer Neg Hx     Allergies  Allergen Reactions   Metformin  And Related Nausea And Vomiting   Aspirin Hives   Ibuprofen Hives   Other     Anesthesia (familial) SISTER HAD MALIGNANT HYPERTHERMIA   Propofol  Other (See Comments)    Mother had issues with it    Current Outpatient Medications on File Prior to Visit  Medication Sig Dispense Refill   Calcium  Carb-Cholecalciferol 600-12.5 MG-MCG TABS Take 1 tablet by mouth daily.     folic acid  (FOLVITE ) 1 MG tablet Take 2 mg by mouth daily.     losartan  (COZAAR ) 25 MG tablet TAKE 1 TABLET BY MOUTH EVERY DAY FOR BLOOD PRESSURE 90 tablet 2   omeprazole (PRILOSEC) 20 MG capsule Take 20 mg by mouth daily.     apixaban  (ELIQUIS ) 5 MG TABS tablet Take 1 tablet (  5 mg total) by mouth 2 (two) times daily. (Patient not taking: Reported on 07/21/2024) 60 tablet 12   clopidogrel  (PLAVIX ) 75 MG tablet Take 75 mg by mouth daily. (Patient not taking: Reported on 07/21/2024)     Current Facility-Administered Medications on File Prior to Visit  Medication Dose Route Frequency Provider Last Rate Last Admin   Chlorhexidine  Gluconate Cloth 2 % PADS 6 each   6 each Topical Daily Huneycutt, [place], NP        BP 130/80   Pulse (!) 57   Temp 98 F (36.7 C) (Oral)   Ht 5' 8 (1.727 m)   Wt 178 lb 4 oz (80.9 kg)   SpO2 99%   BMI 27.10 kg/m  Objective:   Physical Exam Cardiovascular:     Rate and Rhythm: Normal rate.  Pulmonary:     Effort: Pulmonary effort is normal.  Musculoskeletal:     Cervical back: Neck supple.  Skin:    General: Skin is warm and dry.     Comments: 5 staples intact to posterior mid upper occipital region.  Scabbing and dried blood noted.  Neurological:     Mental Status: She is alert and oriented to person, place, and time.  Psychiatric:        Mood and Affect: Mood normal.     Physical Exam        Assessment & Plan:  Subdural hematoma (HCC) Assessment & Plan: With recent hospitalization. Hospital notes, labs, imaging reviewed.  Fortunately, no complications or symptoms to suggest complications. Exam reassuring.  5 staples removed today during visit.  Patient tolerated well. We discussed home care instructions.  Resume apixaban  5 mg twice daily and clopidogrel  75 mg daily tomorrow. She will follow-up with vascular services as scheduled in November.      Assessment and Plan Assessment & Plan         Comer MARLA Gaskins, NP    History of Present Illness

## 2024-07-21 NOTE — Assessment & Plan Note (Signed)
 With recent hospitalization. Hospital notes, labs, imaging reviewed.  Fortunately, no complications or symptoms to suggest complications. Exam reassuring.  5 staples removed today during visit.  Patient tolerated well. We discussed home care instructions.  Resume apixaban  5 mg twice daily and clopidogrel  75 mg daily tomorrow. She will follow-up with vascular services as scheduled in November.

## 2024-07-22 ENCOUNTER — Inpatient Hospital Stay: Admitting: Family Medicine

## 2024-07-26 DIAGNOSIS — H9319 Tinnitus, unspecified ear: Secondary | ICD-10-CM

## 2024-08-08 ENCOUNTER — Encounter: Payer: Self-pay | Admitting: Radiology

## 2024-08-08 DIAGNOSIS — I871 Compression of vein: Secondary | ICD-10-CM

## 2024-08-08 NOTE — Telephone Encounter (Signed)
 Per 07-21-24 OV Note, pt was to resume Plavix  07-22-24.

## 2024-08-09 MED ORDER — CLOPIDOGREL BISULFATE 75 MG PO TABS
75.0000 mg | ORAL_TABLET | Freq: Every day | ORAL | 1 refills | Status: AC
Start: 2024-08-09 — End: ?

## 2024-08-12 NOTE — Progress Notes (Signed)
 Referring Physician(s): Dr. Leatrice Chapel, Dr. 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 She was treated with anticoagulation, LLE aspiration thrombectomy, iliac stent placement and IVC filter placement. She experienced in-stent thrombus and underwent stent recanalization with revision/extension of the stent into the left common femoral vein 10/22/23. The IVC filter was removed during that procedure.   She was started on lovenox  and eventually transitioned back to Eliquis  and Plavix . A surveillance CT 02/08/24 demonstrated non-occlusive recurrent thrombus within the iliac stent system. I recommended a stent thrombectomy/revision with general anesthesia and she was agreeable to proceed. On 03/16/24 she underwent left lower extremity venogram with stent thrombectomy and revision. She tolerated the procedure well and was discharged home the same day. She returned to the ED that evening with bleeding from the [facility] vascular site. I evaluated her in the ED and placed a new suture at the site.   She followed up with me 05/02/24 and reported doing well. Her RIJ site had healed and she denied pain or swelling in the left lower extremity. She reported compliance with Eliquis  and Plavix . A CTV 04/22/24 demonstrated patency of the stent construct. We made plans for CTV abdomen/pelvis in 3 months with clinic visit shortly thereafter. She was encouraged to continue Eliquis  and Plavix  indefinitely.   She unfortunately fell while playing pickle ball and was hospitalized 10/7-10/9/25 for a subdural hematoma with scalp laceration. Eliquis  and Plavix  were held. A left lower extremity ultrasound showed a DVT in the left superficial femoral vein with patent left  common femoral stent. A CTV of the abdomen/pelvis showed patency of the left common iliac/external iliac venous stent. Her anticoagulation was resumed after her scalp staples were removed.    The patient presents to the IR outpatient clinic today for follow up.  She has no complaints with her lower extremities.  No swelling, erythema, or tenderness.  She gets occasional mild swelling around her left ankle which she attributes to her prior orthopedic surgery.  She wears compression stockings occasionally.  Besides her recent subdural hematoma, she is tolerating anticoagulation well.    Past Medical History:  Diagnosis Date   Acute pulmonary embolism (HCC) 05/14/2023   Adenomatous colon polyp 2015   Allergy 1974   teenager, and 2008   Colovesical fistula 2015   COVID-19 06/2020   Environmental and seasonal allergies    Family history of adverse reaction to anesthesia    Sister with Malignant hyperthermia   Hx of adenomatous colonic polyps 01/19/2021   Hypertension 11/23   Low dose prescription   Lower abdominal pain 12/23/2019   Malignant hyperthermia    sister   Mass of soft tissue of abdomen 08/18/2023   May-Thurner syndrome    Migraines    Motion sickness    Persistent cough for 3 weeks or longer 10/10/2021   Pulmonary embolism (HCC) 05/14/2023    Past Surgical History:  Procedure Laterality Date   COLON SURGERY  2016   fistula, small section removed   COLONOSCOPY  2015   Plus others   IR FEM POP ART STENT INC PTA MOD SED  10/22/2023   IR ILIAC ART PTA UNI INITIAL MOD SED  03/17/2024   IR INTRAVASCULAR ULTRASOUND NON CORONARY  05/19/2023   IR  INTRAVASCULAR ULTRASOUND NON CORONARY  07/16/2023   IR INTRAVASCULAR ULTRASOUND NON CORONARY  03/16/2024   IR IVC FILTER PLMT / S&I /IMG GUID/MOD SED  05/19/2023   IR IVC FILTER RETRIEVAL / S&I /IMG GUID/MOD SED  10/22/2023   IR IVUS EACH ADDITIONAL NON CORONARY VESSEL  09/15/2023   IR PTA VENOUS EXCEPT DIALYSIS CIRCUIT  05/19/2023   IR PTA  VENOUS EXCEPT DIALYSIS CIRCUIT  07/16/2023   IR RADIOLOGIST EVAL & MGMT  07/01/2023   IR RADIOLOGIST EVAL & MGMT  08/21/2023   IR RADIOLOGIST EVAL & MGMT  11/13/2023   IR RADIOLOGIST EVAL & MGMT  02/22/2024   IR RADIOLOGIST EVAL & MGMT  05/02/2024   IR THROMBECT VENO MECH MOD SED  05/19/2023   IR THROMBECT VENO MECH MOD SED  07/16/2023   IR THROMBECT VENO MECH MOD SED  10/22/2023   IR THROMBECT VENO MECH MOD SED  03/16/2024   IR TRANSCATH PLC STENT 1ST ART NOT LE CV CAR VERT CAR  05/19/2023   IR TRANSCATH PLC STENT 1ST ART NOT LE CV CAR VERT CAR  07/16/2023   IR US  GUIDE VASC ACCESS LEFT  05/19/2023   IR US  GUIDE VASC ACCESS LEFT  07/16/2023   IR US  GUIDE VASC ACCESS RIGHT  05/19/2023   IR US  GUIDE VASC ACCESS RIGHT  09/15/2023   IR US  GUIDE VASC ACCESS RIGHT  03/16/2024   IR VENO/EXT/UNI LEFT  05/19/2023   IR VENO/EXT/UNI LEFT  07/16/2023   IR VENO/EXT/UNI LEFT  10/22/2023   IR VENO/EXT/UNI LEFT  03/16/2024   IR VENOCAVAGRAM IVC  05/19/2023   IR VENOCAVAGRAM IVC  07/16/2023   IR VENOCAVAGRAM IVC  09/15/2023   IR VENOCAVAGRAM IVC  10/22/2023   IR VENOCAVAGRAM IVC  03/16/2024   LAPAROSCOPIC LEFT COLON RESECTION  2015   Colovesical fistula, [place]   Left peroneal tendon repair Left 2023   RADIOLOGY WITH ANESTHESIA N/A 10/22/2023   Procedure: LLE venous thrombectomy;  Surgeon: Jennefer Ester PARAS, MD;  Location: [facility] OR;  Service: Radiology;  Laterality: N/A;   RADIOLOGY WITH ANESTHESIA N/A 03/16/2024   Procedure: RADIOLOGY WITH ANESTHESIA;  Surgeon: Jennefer Ester PARAS, MD;  Location: [facility] OR;  Service: Radiology;  Laterality: N/A;  Left iliofemoral vein stent thrombectomy   WISDOM TOOTH EXTRACTION      Allergies: Metformin  and related, Aspirin, Ibuprofen, Other, and Propofol   Medications: Prior to Admission medications   Medication Sig Start Date End Date Taking? Authorizing Provider  apixaban  (ELIQUIS ) 5 MG TABS tablet Take 1 tablet (5 mg total) by mouth 2 (two) times daily. Patient not taking: Reported  on 07/21/2024 11/18/23   Odean Potts, MD  Calcium  Carb-Cholecalciferol 600-12.5 MG-MCG TABS Take 1 tablet by mouth daily.    [provider]  clopidogrel  (PLAVIX ) 75 MG tablet Take 1 tablet (75 mg total) by mouth daily. 08/09/24   Clark, Katherine K, NP  folic acid  (FOLVITE ) 1 MG tablet Take 2 mg by mouth daily. 09/14/23   [provider]  losartan  (COZAAR ) 25 MG tablet TAKE 1 TABLET BY MOUTH EVERY DAY FOR BLOOD PRESSURE 04/14/24   Clark, Katherine K, NP  omeprazole (PRILOSEC) 20 MG capsule Take 20 mg by mouth daily.    [provider]     Family History  Problem Relation Age of Onset   Heart disease Mother    Diabetes Mother    Hearing loss Mother    Heart disease Father    Prostate cancer Father    Alcohol  abuse Father    Cancer Father    COPD Father    Malignant hyperthermia Sister    Pancreatic cancer Maternal Aunt    Diabetes Brother    Cancer Maternal Grandmother    Miscarriages / Stillbirths Maternal Grandmother    Diabetes Brother    Intellectual disability Brother    Learning disabilities Brother    Vision loss Brother    Miscarriages / Stillbirths Sister    Miscarriages / Stillbirths Sister    Colon cancer Neg Hx    Esophageal cancer Neg Hx    Stomach cancer Neg Hx    Liver disease Neg Hx    Breast cancer Neg Hx     Social History   Socioeconomic History   Marital status: Married    Spouse name: Lamar   Number of children: Not on file   Years of education: Not on file   Highest education level: Associate degree: occupational, scientist, product/process development, or vocational program  Occupational History   Not on file  Tobacco Use   Smoking status: Never   Smokeless tobacco: Never   Tobacco comments:    Never smoked  Vaping Use   Vaping status: Never Used  Substance and Sexual Activity   Alcohol use: Yes    Alcohol/week: 5.0 standard drinks of alcohol    Types: 5 Glasses of wine per week   Drug use: Never   Sexual activity: Yes    Birth  control/protection: Post-menopausal  Other Topics Concern   Not on file  Social History Narrative   Married.  No children.   Moved from [place] area of [place]  in 2018    Worked in [employer], insurance account manager.  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   Enjoys wine tasting.    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 [employer]   Social Drivers of Corporate Investment Banker Strain: Low Risk  (03/17/2024)   Overall Financial Resource Strain (CARDIA)    Difficulty of Paying Living Expenses: Not hard at all  Food Insecurity: No Food Insecurity (07/14/2024)   Hunger Vital Sign    Worried About Running Out of Food in the Last Year: Never true    Ran Out of Food in the Last Year: Never true  Transportation Needs: No Transportation Needs (07/14/2024)   PRAPARE - Administrator, Civil Service (Medical): No    Lack of Transportation (Non-Medical): No  Physical Activity: Insufficiently Active (03/17/2024)   Exercise Vital Sign    Days of Exercise per Week: 1 day    Minutes of Exercise per Session: 30 min  Stress: No Stress Concern Present (03/17/2024)   [employer] of Occupational Health - Occupational Stress Questionnaire    Feeling of Stress: Not at all  Social Connections: Socially Integrated (07/14/2024)   Social Connection and Isolation Panel    Frequency of Communication with Friends and Family: More than three times a week    Frequency of Social Gatherings with Friends and Family: More than three times a week    Attends Religious Services: More than 4 times per year    Active Member of [employer] or Organizations: Yes    Attends Engineer, Structural: More than 4 times per year    Marital Status: Married     Vital Signs: There were no vitals taken for this visit.  Physical Exam Constitutional:      General: She is  not in acute distress. HENT:     Head: Normocephalic.     Nose: Nose normal.  Eyes:     General: No scleral icterus. Cardiovascular:     Rate and Rhythm: Normal rate and regular rhythm.  Pulmonary:     Effort: No respiratory distress.  Abdominal:     General: There is no distension.  Musculoskeletal:        General: No swelling.     Right lower leg: No edema.     Left lower leg: No edema.  Skin:    Findings: No erythema.  Neurological:     Mental Status: She is alert and oriented to person, place, and time.     Imaging:  CT Chest 11/04/23  Evolving pulmonary infarct, sequela of prior PE   CT AP 11/04/23  Patent left femoral - iliac stents.  Small focal wall-adherent non-occlusive IVC thrombus persists   CTV 02/08/24 Recurrent wall-adherent, non-occlusive thrombus within the genu of the iliac stent system.  Poor venous enhancement bolus capture.     04/22/24 CTV abdomen/pelvis - patent left iliofemoral venous stent - minimal in-stent stenosis about the external iliac portion.  CTV Abdomen/pelvis 07/12/24 IMPRESSION: 1. Left common iliac/external iliac venous stent appears patent. 2. Asymmetric decreased flow demonstrated in the left superficial femoral vein, possibly venous thrombosis or artifact. Consider ultrasound leg veins for further evaluation. 3. Aortic atherosclerosis. 4. Hepatosplenomegaly with fatty infiltration of the liver.  Labs:  CBC: Recent Labs    10/23/23 0653 03/16/24 0852 03/16/24 1936 07/12/24 1542  WBC 5.7 4.1 5.5 6.0  HGB 10.8* 13.2 14.1 13.3  HCT 31.6* 38.5 41.8 38.2  PLT 153 139* 151 142*    COAGS: Recent Labs    10/22/23 0900 03/16/24 0852 03/16/24 1936  INR 1.3* 1.4* 1.1  APTT  --   --  29    BMP: Recent Labs    03/16/24 0852 03/16/24 1936 07/12/24 1542 07/14/24 0131  NA 141 138 137 142  K 4.0 4.1 3.7 3.9  CL 112* 110 103 108  CO2 21* 18* 20* 23  GLUCOSE 112* 187* 112* 114*  BUN 21 19 14 13   CALCIUM  10.4* 10.7*  11.3* 10.0  CREATININE 0.66 0.82 0.82 0.71  GFRNONAA >60 >60 >60 >60    LIVER FUNCTION TESTS: Recent Labs    03/01/24 0921  BILITOT 0.9  AST 25  ALT 33  ALKPHOS 107  PROT 6.6  ALBUMIN 4.5    Assessment and Plan: 68 year old female with history of May Thurner syndrome with VTE now status post left femoral --> iliac stenting with revisions 10/23/23 and 03/16/24.   She has mild wall adherent thrombus in the left iliac stent construct with chronically occluded left superficial femoral vein.  Her left profunda femoral vein is providing the inflow to the iliac system.  She is symptom free and doing very well, currently tolerating Eliquis  and Plavix .    -continue anticogulation/antiplatelet -follow up in 6 months with repeat bilateral lower extremity duplex and CTV abdomen/pelvis -we dicussed that if she re-thromboses the stent or has significant symptoms, we could recanalize her SFV via pedal/calf approach to provide more durable inflow   Ester Sides, MD Pager: [phone]    I spent a total of 25 Minutes in face to face in clinical consultation, greater than 50% of which was counseling/coordinating care for May-Thurner Syndrome.

## 2024-08-15 ENCOUNTER — Ambulatory Visit
Admission: RE | Admit: 2024-08-15 | Discharge: 2024-08-15 | Disposition: A | Discharge status: Home / Self Care | Source: Ambulatory Visit | Attending: Interventional Radiology | Admitting: Interventional Radiology

## 2024-08-15 DIAGNOSIS — I824Z2 Acute embolism and thrombosis of unspecified deep veins of left distal lower extremity: Secondary | ICD-10-CM

## 2024-08-15 HISTORY — PX: IR RADIOLOGIST EVAL & MGMT: IMG5224

## 2024-08-31 ENCOUNTER — Ambulatory Visit: Attending: Primary Care | Admitting: Audiologist

## 2024-08-31 DIAGNOSIS — H9313 Tinnitus, bilateral: Secondary | ICD-10-CM | POA: Diagnosis present

## 2024-08-31 NOTE — Procedures (Signed)
  [facility] [address] [place], [place] [address] [phone]  AUDIOLOGICAL EVALUATION  Name: Heather Hudson    Status: Outpatient DOB: 1956/02/06    Referent: Gretta Comer POUR, NP MRN: [id number] Date: 08/31/2024     Diagnosis: Tinnitus of both ears   HISTORY: [name], 68 y.o., was seen for an audiological evaluation. [name] reports that she began to notice a buzzing sound in both ears, like she would get after attending a concert, about 5 days after experiencing a fall where she hit her head. She request hearing evaluation to determine her hearing status. She feels that the buzzing sound has since resolved.  [name] notes no ear pain, pressure, tinnitus, or fullness.  [name] denies history of noise exposure and dizziness. There is family history of hearing loss, with her mother developing hearing loss in her 30s. There is no history of ear surgery or ear infections.          EVALUATION: Otoscopic inspection reveals clear ear canals with visible tympanic membranes.   Tympanometry was completed to assess middle ear status. Normal, Type A tympanograms were obtained bilaterally.  Standard audiometric techniques were used to obtain thresholds under supra-aural headphones. Speech reception thresholds are 5 dBHL on the right and 5 dBHL on the left using recorded spondee word lists. Word recognition was 100% at 45 dBHL on the right and 96% at 45 dBHL on the left using recorded NU-6 word lists, in quiet. Hearing was found to be within normal limits.   CONCLUSION:  [name] has normal hearing, bilaterally. Word recognition is good in quiet at soft conversational speech levels bilaterally. Findings were reviewed with the patient.   RECOMMENDATIONS: Repeat hearing evaluation if change in hearing is noted. Use of hearing protection in noisy situations.  Audiogram scanned in under media tab. 25 minutes spent testing and counseling.  Delon EMERSON Baumgartner, Au.D., CCC-A Audiologist 08/31/2024  cc: Gretta Comer POUR, NP

## 2024-11-02 ENCOUNTER — Ambulatory Visit: Admitting: Primary Care

## 2024-11-02 VITALS — BP 122/82 | HR 63 | Temp 97.9°F | Ht 68.0 in | Wt 193.0 lb

## 2024-11-02 DIAGNOSIS — R21 Rash and other nonspecific skin eruption: Secondary | ICD-10-CM | POA: Diagnosis not present

## 2024-11-02 MED ORDER — TRIAMCINOLONE ACETONIDE 0.5 % EX OINT: 1.0000 | TOPICAL_OINTMENT | Freq: Two times a day (BID) | CUTANEOUS | 0 refills | Status: AC | PRN

## 2024-11-02 NOTE — Patient Instructions (Signed)
 Use triamcinolone  ointment twice daily as needed for your rash.  Do not exceed 2 consecutive weeks.  Avoid applying the ointment to your face.  Please update me next week  It was a pleasure to see you today!

## 2024-11-02 NOTE — Assessment & Plan Note (Signed)
 Exam today representative of eczema/psoriasis.  Discussed options for treatment today including steroid injection, oral steroid prescription, topical steroid. She opts for topical steroid to start.  Triamcinolone  0.5% ointment to pharmacy to use twice daily as needed. We discussed to refrain from using consecutively for more than 2 weeks.  She will update.

## 2024-11-02 NOTE — Progress Notes (Signed)
 "  Subjective:    Patient ID: Heather Hudson, female    DOB: 1956-07-06, 69 y.o.   MRN: [id number]  Heather Hudson is a very pleasant 70 y.o. 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   Chronic history of intermittent rash to the hands for the last 2-3 years. Her rash begins as a scaly itchy patch, she then develops erythema and worsening rash due to pruritus.  Her recent flare began around Christmas to the left dorsal hand. More recently she's noticed new patches to the right upper and bilateral lower extremities.   She's never undergone evaluation for her chronic rash.   No new lotions, detergents, soaps or shampoos. No new medicines, vitamins, supplements. No new pets. No recent outdoor exposure or poison ivy exposure. No bonfire or smoke exposure.  No recent motel or hotel stay or new beds.   No fevers/chills, oral lesions, new joint pains, tick bites, abdominal pain, nausea.     Review of Systems  Constitutional:  Negative for fever.  Skin:  Positive for color change and rash. Negative for wound.         Past Medical History:  Diagnosis Date   Acute pulmonary embolism (HCC) 05/14/2023   Adenomatous colon polyp 2015   Allergy 1974   teenager, and 2008   Colovesical fistula 2015   COVID-19 06/2020   Environmental and seasonal allergies    Family history of adverse reaction to anesthesia    Sister with Malignant hyperthermia   Hx of adenomatous colonic polyps 01/19/2021   Hypertension 11/23   Low dose prescription   Lower abdominal pain 12/23/2019   Malignant hyperthermia    sister   Mass of soft tissue of abdomen 08/18/2023   May-Thurner syndrome    Migraines    Motion sickness    Persistent cough for 3 weeks or longer 10/10/2021   Pulmonary embolism (HCC) 05/14/2023    Social History   Socioeconomic History   Marital status: Married    Spouse name: Heather Hudson    Number of children: Not on file   Years of education: Not on file   Highest education level: Associate degree: occupational, scientist, product/process development, or vocational program  Occupational History   Not on file  Tobacco Use   Smoking status: Never   Smokeless tobacco: Never   Tobacco comments:    Never smoked  Vaping Use   Vaping status: Never Used  Substance and Sexual Activity   Alcohol use: Yes    Alcohol/week: 5.0 standard drinks of alcohol    Types: 5 Glasses of wine per week   Drug use: Never   Sexual activity: Yes    Birth control/protection: Post-menopausal  Other Topics Concern   Not on file  Social History Narrative   Married.  No children.   Moved from [place] area of [place]  in 2018    Worked in [employer], insurance account manager.  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   Enjoys wine tasting.    1-2 caffeinated drinks a day 0-1 alcoholic never smoker no drug use   Sister Heather Hudson works Adult Nurse endoscopy as [employer]   Social Drivers of Health   Tobacco Use: Low Risk (07/21/2024)   Patient History    Smoking Tobacco Use: Never    Smokeless Tobacco Use: Never    Passive Exposure: Not on file  Financial  Resource Strain: Low Risk (03/17/2024)   Overall Financial Resource Strain (CARDIA)    Difficulty of Paying Living Expenses: Not hard at all  Food Insecurity: No Food Insecurity (07/14/2024)   Epic    Worried About Programme Researcher, Broadcasting/film/video in the Last Year: Never true    Ran Out of Food in the Last Year: Never true  Transportation Needs: No Transportation Needs (07/14/2024)   Epic    Lack of Transportation (Medical): No    Lack of Transportation (Non-Medical): No  Physical Activity: Insufficiently Active (03/17/2024)   Exercise Vital Sign    Days of Exercise per Week: 1 day    Minutes of Exercise per Session: 30 min  Stress: No Stress Concern Present (03/17/2024)   [employer] of Occupational Health -  Occupational Stress Questionnaire    Feeling of Stress: Not at all  Social Connections: Socially Integrated (07/14/2024)   Social Connection and Isolation Panel    Frequency of Communication with Friends and Family: More than three times a week    Frequency of Social Gatherings with Friends and Family: More than three times a week    Attends Religious Services: More than 4 times per year    Active Member of Clubs or Organizations: Yes    Attends Engineer, Structural: More than 4 times per year    Marital Status: Married  Catering Manager Violence: Not At Risk (03/17/2024)   Epic    Fear of Current or Ex-Partner: No    Emotionally Abused: No    Physically Abused: No    Sexually Abused: No  Depression (PHQ2-9): Low Risk (11/02/2024)   Depression (PHQ2-9)    PHQ-2 Score: 0  Alcohol Screen: Low Risk (03/17/2024)   Alcohol Screen    Last Alcohol Screening Score (AUDIT): 3  Housing: Low Risk (07/14/2024)   Epic    Unable to Pay for Housing in the Last Year: No    Number of Times Moved in the Last Year: 0    Homeless in the Last Year: No  Utilities: Not At Risk (07/14/2024)   Epic    Threatened with loss of utilities: No  Health Literacy: Adequate Health Literacy (03/17/2024)   B1300 Health Literacy    Frequency of need for help with medical instructions: Never    Past Surgical History:  Procedure Laterality Date   COLON SURGERY  2016   fistula, small section removed   COLONOSCOPY  2015   Plus others   IR FEM POP ART STENT INC PTA MOD SED  10/22/2023   IR ILIAC ART PTA UNI INITIAL MOD SED  03/17/2024   IR INTRAVASCULAR ULTRASOUND NON CORONARY  05/19/2023   IR INTRAVASCULAR ULTRASOUND NON CORONARY  07/16/2023   IR INTRAVASCULAR ULTRASOUND NON CORONARY  03/16/2024   IR IVC FILTER PLMT / S&I /IMG GUID/MOD SED  05/19/2023   IR IVC FILTER RETRIEVAL / S&I /IMG GUID/MOD SED  10/22/2023   IR IVUS EACH ADDITIONAL NON CORONARY VESSEL  09/15/2023   IR PTA VENOUS EXCEPT DIALYSIS CIRCUIT   05/19/2023   IR PTA VENOUS EXCEPT DIALYSIS CIRCUIT  07/16/2023   IR RADIOLOGIST EVAL & MGMT  07/01/2023   IR RADIOLOGIST EVAL & MGMT  08/21/2023   IR RADIOLOGIST EVAL & MGMT  11/13/2023   IR RADIOLOGIST EVAL & MGMT  02/22/2024   IR RADIOLOGIST EVAL & MGMT  05/02/2024   IR RADIOLOGIST EVAL & MGMT  08/15/2024   IR THROMBECT VENO MECH MOD SED  05/19/2023   IR  THROMBECT VENO MECH MOD SED  07/16/2023   IR THROMBECT VENO MECH MOD SED  10/22/2023   IR THROMBECT VENO MECH MOD SED  03/16/2024   IR TRANSCATH PLC STENT 1ST ART NOT LE CV CAR VERT CAR  05/19/2023   IR TRANSCATH PLC STENT 1ST ART NOT LE CV CAR VERT CAR  07/16/2023   IR US  GUIDE VASC ACCESS LEFT  05/19/2023   IR US  GUIDE VASC ACCESS LEFT  07/16/2023   IR US  GUIDE VASC ACCESS RIGHT  05/19/2023   IR US  GUIDE VASC ACCESS RIGHT  09/15/2023   IR US  GUIDE VASC ACCESS RIGHT  03/16/2024   IR VENO/EXT/UNI LEFT  05/19/2023   IR VENO/EXT/UNI LEFT  07/16/2023   IR VENO/EXT/UNI LEFT  10/22/2023   IR VENO/EXT/UNI LEFT  03/16/2024   IR VENOCAVAGRAM IVC  05/19/2023   IR VENOCAVAGRAM IVC  07/16/2023   IR VENOCAVAGRAM IVC  09/15/2023   IR VENOCAVAGRAM IVC  10/22/2023   IR VENOCAVAGRAM IVC  03/16/2024   LAPAROSCOPIC LEFT COLON RESECTION  2015   Colovesical fistula, [place]   Left peroneal tendon repair Left 2023   RADIOLOGY WITH ANESTHESIA N/A 10/22/2023   Procedure: LLE venous thrombectomy;  Surgeon: Jennefer Ester PARAS, MD;  Location: [facility] OR;  Service: Radiology;  Laterality: N/A;   RADIOLOGY WITH ANESTHESIA N/A 03/16/2024   Procedure: RADIOLOGY WITH ANESTHESIA;  Surgeon: Jennefer Ester PARAS, MD;  Location: [facility] OR;  Service: Radiology;  Laterality: N/A;  Left iliofemoral vein stent thrombectomy   WISDOM TOOTH EXTRACTION      Family History  Problem Relation Age of Onset   Heart disease Mother    Diabetes Mother    Hearing loss Mother    Heart disease Father    Prostate cancer Father    Alcohol abuse Father    Cancer Father    COPD Father    Malignant hyperthermia  Sister    Pancreatic cancer Maternal Aunt    Diabetes Brother    Cancer Maternal Grandmother    Miscarriages / Stillbirths Maternal Grandmother    Diabetes Brother    Intellectual disability Brother    Learning disabilities Brother    Vision loss Brother    Miscarriages / Stillbirths Sister    Miscarriages / Stillbirths Sister    Colon cancer Neg Hx    Esophageal cancer Neg Hx    Stomach cancer Neg Hx    Liver disease Neg Hx    Breast cancer Neg Hx     Allergies[1]  Medications Ordered Prior to Encounter[2]  BP 122/82   Pulse 63   Temp 97.9 F (36.6 C) (Oral)   Ht 5' 8 (1.727 m)   Wt 193 lb (87.5 kg)   SpO2 97%   BMI 29.35 kg/m  Objective:   Physical Exam Cardiovascular:     Rate and Rhythm: Normal rate.  Pulmonary:     Effort: Pulmonary effort is normal.  Skin:    General: Skin is warm and dry.     Findings: Erythema present.     Comments: Large oblong shaped scaly rash to left medial dorsal hand with excoriation, rubor.  Several small flesh colored scaly patches to right anterior upper extremity and right lower portion of right posterior lower extremity.   See pictures.   Neurological:     Mental Status: She is alert.     Physical Exam          Assessment & Plan:  Rash and nonspecific skin eruption Assessment & Plan:  Exam today representative of eczema/psoriasis.  Discussed options for treatment today including steroid injection, oral steroid prescription, topical steroid. She opts for topical steroid to start.  Triamcinolone  0.5% ointment to pharmacy to use twice daily as needed. We discussed to refrain from using consecutively for more than 2 weeks.  She will update.  Orders: -     Triamcinolone  Acetonide; Apply 1 Application topically 2 (two) times daily as needed.  Dispense: 30 g; Refill: 0    Assessment and Plan Assessment & Plan         Heather MARLA Gaskins, NP       [1]  Allergies Allergen Reactions   Metformin  And  Related Nausea And Vomiting   Aspirin Hives   Ibuprofen Hives   Other     Anesthesia (familial) SISTER HAD MALIGNANT HYPERTHERMIA   Propofol  Other (See Comments)    Mother had issues with it  [2]  Current Outpatient Medications on File Prior to Visit  Medication Sig Dispense Refill   Calcium  Carb-Cholecalciferol 600-12.5 MG-MCG TABS Take 1 tablet by mouth daily.     clopidogrel  (PLAVIX ) 75 MG tablet Take 1 tablet (75 mg total) by mouth daily. 90 tablet 1   folic acid  (FOLVITE ) 1 MG tablet Take 2 mg by mouth daily.     losartan  (COZAAR ) 25 MG tablet TAKE 1 TABLET BY MOUTH EVERY DAY FOR BLOOD PRESSURE 90 tablet 2   omeprazole (PRILOSEC) 20 MG capsule Take 20 mg by mouth daily.     apixaban  (ELIQUIS ) 5 MG TABS tablet Take 1 tablet (5 mg total) by mouth 2 (two) times daily. (Patient not taking: Reported on 11/02/2024) 60 tablet 12   Current Facility-Administered Medications on File Prior to Visit  Medication Dose Route Frequency Provider Last Rate Last Admin   Chlorhexidine  Gluconate Cloth 2 % PADS 6 each  6 each Topical Daily Huneycutt, Brittany, NP       "

## 2024-11-09 NOTE — Progress Notes (Signed)
 Heather Hudson                                          MRN: [id number]   11/09/2024   The [facility] Quality Team Specialist reviewed this patient medical record for the purposes of chart review for care gap closure. The following were reviewed: chart review for care gap closure-kidney health evaluation for diabetes:eGFR  and uACR.    [facility] Quality Team

## 2024-11-15 ENCOUNTER — Telehealth: Payer: PPO | Admitting: Hematology and Oncology

## 2024-11-21 ENCOUNTER — Telehealth: Payer: Self-pay | Admitting: Hematology and Oncology

## 2025-03-21 ENCOUNTER — Ambulatory Visit
